# Patient Record
Sex: Female | Born: 1935 | Race: Black or African American | Hispanic: No | Marital: Married | Smoking: Never smoker
Health system: Southern US, Community
[De-identification: ages and names within clinical notes are randomized; demographics above are authoritative.]

## PROBLEM LIST (undated history)

## (undated) DIAGNOSIS — I340 Nonrheumatic mitral (valve) insufficiency: Secondary | ICD-10-CM

## (undated) DIAGNOSIS — I10 Essential (primary) hypertension: Secondary | ICD-10-CM

## (undated) DIAGNOSIS — E785 Hyperlipidemia, unspecified: Secondary | ICD-10-CM

## (undated) DIAGNOSIS — F419 Anxiety disorder, unspecified: Secondary | ICD-10-CM

## (undated) DIAGNOSIS — R002 Palpitations: Secondary | ICD-10-CM

## (undated) DIAGNOSIS — J449 Chronic obstructive pulmonary disease, unspecified: Secondary | ICD-10-CM

## (undated) DIAGNOSIS — F329 Major depressive disorder, single episode, unspecified: Secondary | ICD-10-CM

## (undated) DIAGNOSIS — F32A Depression, unspecified: Secondary | ICD-10-CM

## (undated) DIAGNOSIS — E039 Hypothyroidism, unspecified: Secondary | ICD-10-CM

## (undated) DIAGNOSIS — I4891 Unspecified atrial fibrillation: Secondary | ICD-10-CM

## (undated) DIAGNOSIS — E119 Type 2 diabetes mellitus without complications: Secondary | ICD-10-CM

## (undated) DIAGNOSIS — I509 Heart failure, unspecified: Secondary | ICD-10-CM

## (undated) HISTORY — DX: Essential (primary) hypertension: I10

## (undated) HISTORY — DX: Type 2 diabetes mellitus without complications: E11.9

## (undated) HISTORY — DX: Hyperlipidemia, unspecified: E78.5

## (undated) HISTORY — DX: Anxiety disorder, unspecified: F41.9

## (undated) HISTORY — PX: OTHER SURGICAL HISTORY: SHX169

## (undated) HISTORY — DX: Nonrheumatic mitral (valve) insufficiency: I34.0

## (undated) HISTORY — DX: Hypothyroidism, unspecified: E03.9

## (undated) HISTORY — DX: Palpitations: R00.2

## (undated) HISTORY — DX: Major depressive disorder, single episode, unspecified: F32.9

## (undated) HISTORY — DX: Heart failure, unspecified: I50.9

## (undated) HISTORY — DX: Depression, unspecified: F32.A

---

## 2002-06-15 ENCOUNTER — Encounter: Payer: Self-pay | Admitting: Internal Medicine

## 2002-06-15 ENCOUNTER — Ambulatory Visit (HOSPITAL_COMMUNITY): Admission: RE | Admit: 2002-06-15 | Discharge: 2002-06-15 | Payer: Self-pay | Admitting: Internal Medicine

## 2008-08-26 ENCOUNTER — Ambulatory Visit: Payer: Self-pay | Admitting: Cardiology

## 2008-08-27 ENCOUNTER — Encounter: Payer: Self-pay | Admitting: Cardiology

## 2008-09-20 ENCOUNTER — Encounter: Payer: Self-pay | Admitting: Physician Assistant

## 2008-09-20 ENCOUNTER — Ambulatory Visit: Payer: Self-pay | Admitting: Cardiology

## 2008-09-24 ENCOUNTER — Ambulatory Visit: Payer: Self-pay | Admitting: Cardiology

## 2008-09-25 ENCOUNTER — Encounter: Payer: Self-pay | Admitting: Cardiology

## 2008-10-30 ENCOUNTER — Encounter: Payer: Self-pay | Admitting: Physician Assistant

## 2008-10-30 ENCOUNTER — Ambulatory Visit: Payer: Self-pay | Admitting: Cardiology

## 2008-11-01 ENCOUNTER — Encounter: Payer: Self-pay | Admitting: Physician Assistant

## 2008-11-05 ENCOUNTER — Encounter: Payer: Self-pay | Admitting: Cardiology

## 2008-11-07 ENCOUNTER — Encounter: Payer: Self-pay | Admitting: Physician Assistant

## 2008-11-07 ENCOUNTER — Ambulatory Visit: Payer: Self-pay | Admitting: Cardiology

## 2009-02-12 DIAGNOSIS — E785 Hyperlipidemia, unspecified: Secondary | ICD-10-CM

## 2009-02-12 DIAGNOSIS — R002 Palpitations: Secondary | ICD-10-CM | POA: Insufficient documentation

## 2009-02-12 DIAGNOSIS — R0602 Shortness of breath: Secondary | ICD-10-CM

## 2009-03-21 ENCOUNTER — Ambulatory Visit: Payer: Self-pay | Admitting: Cardiology

## 2009-03-24 ENCOUNTER — Encounter: Payer: Self-pay | Admitting: Physician Assistant

## 2009-09-01 ENCOUNTER — Encounter: Payer: Self-pay | Admitting: Cardiology

## 2009-10-09 ENCOUNTER — Ambulatory Visit: Payer: Self-pay | Admitting: Cardiology

## 2009-10-09 DIAGNOSIS — I340 Nonrheumatic mitral (valve) insufficiency: Secondary | ICD-10-CM

## 2009-10-09 DIAGNOSIS — I10 Essential (primary) hypertension: Secondary | ICD-10-CM

## 2010-03-16 ENCOUNTER — Ambulatory Visit: Payer: Self-pay | Admitting: Cardiology

## 2010-03-17 ENCOUNTER — Encounter: Payer: Self-pay | Admitting: Cardiology

## 2010-04-28 ENCOUNTER — Telehealth: Payer: Self-pay | Admitting: Cardiology

## 2010-05-07 ENCOUNTER — Inpatient Hospital Stay (HOSPITAL_COMMUNITY): Admission: EM | Admit: 2010-05-07 | Discharge: 2010-05-09 | Payer: Self-pay | Source: Home / Self Care

## 2010-05-14 ENCOUNTER — Ambulatory Visit: Payer: Self-pay | Admitting: Cardiology

## 2010-05-22 ENCOUNTER — Encounter: Payer: Self-pay | Admitting: Adult Health

## 2010-05-22 DIAGNOSIS — E119 Type 2 diabetes mellitus without complications: Secondary | ICD-10-CM

## 2010-05-22 DIAGNOSIS — N259 Disorder resulting from impaired renal tubular function, unspecified: Secondary | ICD-10-CM | POA: Insufficient documentation

## 2010-05-22 DIAGNOSIS — R600 Localized edema: Secondary | ICD-10-CM

## 2010-05-26 ENCOUNTER — Ambulatory Visit (HOSPITAL_COMMUNITY)
Admission: RE | Admit: 2010-05-26 | Discharge: 2010-05-26 | Payer: Self-pay | Source: Home / Self Care | Attending: Cardiology | Admitting: Cardiology

## 2010-05-26 ENCOUNTER — Encounter (INDEPENDENT_AMBULATORY_CARE_PROVIDER_SITE_OTHER): Payer: Self-pay | Admitting: *Deleted

## 2010-05-26 ENCOUNTER — Encounter: Payer: Self-pay | Admitting: Adult Health

## 2010-05-26 LAB — CONVERTED CEMR LAB
Calcium: 9.8 mg/dL (ref 8.4–10.5)
Creatinine, Ser: 0.86 mg/dL (ref 0.40–1.20)
Glucose, Bld: 71 mg/dL (ref 70–99)
Sodium: 139 meq/L (ref 135–145)

## 2010-06-05 ENCOUNTER — Encounter (INDEPENDENT_AMBULATORY_CARE_PROVIDER_SITE_OTHER): Payer: Self-pay | Admitting: *Deleted

## 2010-06-05 ENCOUNTER — Telehealth (INDEPENDENT_AMBULATORY_CARE_PROVIDER_SITE_OTHER): Payer: Self-pay | Admitting: *Deleted

## 2010-06-05 ENCOUNTER — Telehealth (INDEPENDENT_AMBULATORY_CARE_PROVIDER_SITE_OTHER): Payer: Self-pay

## 2010-06-19 ENCOUNTER — Ambulatory Visit
Admission: RE | Admit: 2010-06-19 | Discharge: 2010-06-19 | Payer: Self-pay | Source: Home / Self Care | Attending: Cardiology | Admitting: Cardiology

## 2010-06-23 NOTE — Progress Notes (Signed)
Summary: patient weak and not feeling  Phone Note Call from Patient Call back at Home Phone [phone]   Caller: Patient Reason for Call: Talk to Nurse Summary of Call: patient not felling well. Feeling weak  170/69 12/3   152/66 12/5   pulse 93 right arm    166/70 pulse 82 left arm  125-63 pulse 82 right arm   166/70 pulse 82  left arm  Initial call taken by: Claudette Laws,  April 28, 2010 9:11 AM  Follow-up for Phone Call        Pt concerned about what she should do about BP and feeling weak. She is requesting an appt for f/u on weak feeling. Pt scheduled on 1/4 with Dr. Diona Browner per her request. She did not want to wait for response from Dr. Diona Browner but rather requested appt be scheduled. She also refused sooner appt w/PA.  Pt also states she has 2 days of simvastatin left and will start Lipitor on Friday. Reminder placed for labs in March. Pt is aware of this plan though she does seem somewhat confused regarding her medications. She states she was told to stop heart medicine and started Lipitor. Spent about 10 minutes explaining to pt that she was to start Lipitor only once she finished Simvastatin. She was not to stop any medication other than simvastatin. Pt verbalized understandinding. Follow-up by: Cyril Loosen, RN, BSN,  April 28, 2010 11:23 AM     Appended Document: patient weak and not feeling Pt returned called. She had pharmacy verfiy medications as she couldn't read the bottle on her metoprolol. She states she thought she was almost out of the simvastatin but she was actually almost out of metoprolol. She does states she has been taking these correctly, however. She states she has about 22 of the simvastatin left. She will contact us when she starts the Lipitor so that we can order labs for 3 months later.

## 2010-06-23 NOTE — Letter (Signed)
Summary: Appointment -missed  [facility] [address]   [place], [place] [address]   Phone: [phone]  Fax: [phone]     September 01, 2009 MRN: [id number]     [facility] [address] [place], [place]  [address]     Dear Ms. Pautsch,  Our records indicate you missed your appointment on September 01, 2009                        with Dr.  Diona Browner.   It is very important that we reach you to reschedule this appointment. We look forward to participating in your health care needs.   Please contact us at the number listed above at your earliest convenience to reschedule this appointment.   Sincerely,    Glass blower/designer

## 2010-06-23 NOTE — Assessment & Plan Note (Signed)
Summary: 6 MO FU R/D NO SHOW   Visit Type:  Follow-up Primary Provider:  Dr. Lia Hopping   History of Present Illness: 77 year old woman presents for followup. She is here with her husband. She reports less breathlessness in general, and also no progressive palpitations. She has had no dizziness or syncope.  Followup labs in November 2010  revealed AST 19, ALT 18, total cholesterol 138, triglycerides 47, HDL 44, LDL 85. She states that she has recently had followup labs with Dr. Olena Leatherwood who has been following her cholesterol.  She brought in home blood pressure checks which actually look quite good, generally with systolics in the 120 range.  She asked about doing some work in the garden with her husband in the mornings. We talked about the importance of regular exercise.  Current Medications (verified): 1)  Metoprolol Tartrate 50 Mg Tabs (Metoprolol Tartrate) .... Take One Tablet By Mouth Twice A Day 2)  Synthroid 50 Mcg Tabs (Levothyroxine Sodium) .... Take One Tablet By Mouth Every Morning 3)  Quinapril Hcl 20 Mg Tabs (Quinapril Hcl) .... Take 1 Tablet By Mouth Once A Day 4)  Simvastatin 40 Mg Tabs (Simvastatin) .... Take 1 Tab By Mouth At Bedtime 5)  Oscal 500/200 D-3 500-200 Mg-Unit Tabs (Calcium-Vitamin D) .... Take 1 Tablet By Mouth Once A Day 6)  Glyburide 5 Mg Tabs (Glyburide) .... Take 1 Tablet By Mouth Once A Day 7)  Aspirin 81 Mg Tbec (Aspirin) .... Take 1 Tablet By Mouth Once A Day 8)  Hydrochlorothiazide 50 Mg Tabs (Hydrochlorothiazide) .... Take 1 Tablet By Mouth Once A Day 9)  Amlodipine Besylate 10 Mg Tabs (Amlodipine Besylate) .... Take 1 Tablet By Mouth Once A Day 10)  Fish Oil 1200 Mg Caps (Omega-3 Fatty Acids) .... Take 1 Tablet By Mouth Once A Day 11)  Centrum Silver Ultra Womens  Tabs (Multiple Vitamins-Minerals) .... Take 1 Tablet By Mouth Once A Day  Allergies (verified): No Known Drug Allergies  Comments:  Nurse/Medical Assistant: The patient's  medications and allergies were reviewed with the patient and were updated in the Medication and Allergy Lists. Bottles reviewed.   Past History:  Past Medical History: Hypothyroidism Mitral regurgitation - mild Palpitations - PAT/EAT/NSVT, normal LVEF Hypertension Diabetes Type 2  Review of Systems       The patient complains of dyspnea on exertion.  The patient denies anorexia, fever, chest pain, syncope, headaches, hemoptysis, melena, and hematochezia.         Otherwise reviewed and negative.  Vital Signs:  Patient profile:   77 year old female Height:      66 inches Weight:      210 pounds Pulse rate:   77 / minute BP sitting:   157 / 73  (left arm) Cuff size:   large  Vitals Entered By: Carlye Grippe (Oct 09, 2009 9:49 AM)  Physical Exam  Additional Exam:  Obese woman in acute distress. HEENT: Conjunctiva and lids normal, oropharynx clear. Neck: Supple, no elevated JVP. Lungs: Clear auscultation, nonlabored. Cardiac: Regular rate and rhythm, no S3. Abdomen: Soft, nontender. Extremities: Chronic stasis with edema.   Nuclear Study  Procedure date:  11/07/2008  Findings:      Lexiscan Cardiolite demonstrated no evidence of focal ischemia with LVEF 64%.  EKG  Procedure date:  10/09/2009  Findings:      Normal sinus rhythm at 78 beats per minute with PAC.  Impression & Recommendations:  Problem # 1:  ESSENTIAL HYPERTENSION, BENIGN (ICD-401.1)  Blood  pressure up today, although her trend at home looks quite good. No specific changes were made. Keep an eye on this with Dr. Olena Leatherwood.  Her updated medication list for this problem includes:    Metoprolol Tartrate 50 Mg Tabs (Metoprolol tartrate) .Marland Kitchen... Take one tablet by mouth twice a day    Quinapril Hcl 20 Mg Tabs (Quinapril hcl) .Marland Kitchen... Take 1 tablet by mouth once a day    Aspirin 81 Mg Tbec (Aspirin) .Marland Kitchen... Take 1 tablet by mouth once a day    Hydrochlorothiazide 50 Mg Tabs (Hydrochlorothiazide) .Marland Kitchen... Take 1  tablet by mouth once a day    Amlodipine Besylate 10 Mg Tabs (Amlodipine besylate) .Marland Kitchen... Take 1 tablet by mouth once a day  Problem # 2:  HYPERLIPIDEMIA-MIXED (ICD-272.4)  Followed by Dr. Olena Leatherwood, reportedly with good control based on recent labs.  Her updated medication list for this problem includes:    Simvastatin 40 Mg Tabs (Simvastatin) .Marland Kitchen... Take 1 tab by mouth at bedtime  Problem # 3:  PALPITATIONS (ICD-785.1)  Less problematic. No definite atrial fibrillation has been observed. She has had PAT and EAT predominantly, relatively brief.  Her updated medication list for this problem includes:    Metoprolol Tartrate 50 Mg Tabs (Metoprolol tartrate) .Marland Kitchen... Take one tablet by mouth twice a day    Quinapril Hcl 20 Mg Tabs (Quinapril hcl) .Marland Kitchen... Take 1 tablet by mouth once a day    Aspirin 81 Mg Tbec (Aspirin) .Marland Kitchen... Take 1 tablet by mouth once a day    Amlodipine Besylate 10 Mg Tabs (Amlodipine besylate) .Marland Kitchen... Take 1 tablet by mouth once a day  Orders: EKG w/ Interpretation (93000)  Problem # 4:  MITRAL VALVE DISORDERS (ICD-424.0)  Mild mitral regurgitation by echocardiography. Can follow clinically at this point.  Her updated medication list for this problem includes:    Metoprolol Tartrate 50 Mg Tabs (Metoprolol tartrate) .Marland Kitchen... Take one tablet by mouth twice a day    Quinapril Hcl 20 Mg Tabs (Quinapril hcl) .Marland Kitchen... Take 1 tablet by mouth once a day    Hydrochlorothiazide 50 Mg Tabs (Hydrochlorothiazide) .Marland Kitchen... Take 1 tablet by mouth once a day  Patient Instructions: 1)  Your physician wants you to follow-up in: 6 months. You will receive a reminder letter in the mail one-two months in advance. If you don't receive a letter, please call our office to schedule the follow-up appointment. 2)  Your physician recommends that you continue on your current medications as directed. Please refer to the Current Medication list given to you today.

## 2010-06-23 NOTE — Assessment & Plan Note (Signed)
Summary: 6 MO FUL FHOLT   Visit Type:  Follow-up Primary Mckynna Vanloan:  Dr. Lia Hopping   History of Present Illness: 77 year old woman presents for followup. She was seen back in May. She reports doing very well. She has had no palpitations or chest pain. She keeps a record of her blood pressure and glucose checks at home. We reviewed these today. Generally her blood pressure is reasonably well controlled.  I went over her medications, and we discussed modification in lipid treatment since she is on both simvastatin and amlodipine. She is also due for followup fasting lipid profile and liver function tests.  Clinical Review Panels:  Echocardiogram Echocardiogram 1. The left ventricular chamber size is normal. There is no left         ventricular hypertrophy.   Global left ventricular wall motion and         contractility are within normal limits. The estimated ejection         fraction is 55-60%.  Abnormal left ventricular diastolic filling is         observed, consistent with impaired relaxation.          2. The left atrium is mildly dilated.         3. There is mitral annular calcification. There is mild mitral         regurgitation.          4. There is aortic annular calcification. There is no evidence of           aortic regurgitation. The mean gradient of the aortic valve is 6 mmHg.                   5. There is mild tricuspid regurgitation. The right ventricular         systolic pressure is calculated at 35 mmHg.          6. There is no pericardial effusion. (08/27/2008)    Preventive Screening-Counseling & Management  Alcohol-Tobacco     Smoking Status: quit     Year Quit: 1950  Current Medications (verified): 1)  Metoprolol Tartrate 50 Mg Tabs (Metoprolol Tartrate) .... Take One Tablet By Mouth Twice A Day 2)  Levothyroxine Sodium 100 Mcg Tabs (Levothyroxine Sodium) .... Take 1 Tablet By Mouth Once A Day 3)  Quinapril Hcl 20 Mg Tabs (Quinapril Hcl) .... Take 1 Tablet  By Mouth Once A Day 4)  Simvastatin 40 Mg Tabs (Simvastatin) .... Take 1 Tab By Mouth At Bedtime 5)  Oscal 500/200 D-3 500-200 Mg-Unit Tabs (Calcium-Vitamin D) .... Take 1 Tablet By Mouth Once A Day 6)  Glyburide 5 Mg Tabs (Glyburide) .... Take 1 Tablet By Mouth Once A Day 7)  Aspirin 81 Mg Tbec (Aspirin) .... Take 1 Tablet By Mouth Once A Day 8)  Hydrochlorothiazide 50 Mg Tabs (Hydrochlorothiazide) .... Take 1 Tablet By Mouth Once A Day 9)  Amlodipine Besylate 10 Mg Tabs (Amlodipine Besylate) .... Take 1 Tablet By Mouth Once A Day 10)  Fish Oil 1000 Mg Caps (Omega-3 Fatty Acids) .... Take 1 Tablet By Mouth Once A Day 11)  Centrum Silver Ultra Womens  Tabs (Multiple Vitamins-Minerals) .... Take 1 Tablet By Mouth Once A Day  Allergies (verified): No Known Drug Allergies  Comments:  Nurse/Medical Assistant: The patient's medication bottles and allergies were reviewed with the patient and were updated in the Medication and Allergy Lists.  Past History:  Past Medical History: Last updated: 10/09/2009 Hypothyroidism Mitral regurgitation - mild  Palpitations - PAT/EAT/NSVT, normal LVEF Hypertension Diabetes Type 2  Social History: Last updated: 02/12/2009 Married  Tobacco Use - No.  Alcohol Use - no  Review of Systems  The patient denies anorexia, fever, chest pain, syncope, dyspnea on exertion, peripheral edema, melena, and hematochezia.         Otherwise reviewed and negative.  Vital Signs:  Patient profile:   77 year old female Height:      66 inches Weight:      198 pounds BMI:     32.07 Pulse rate:   67 / minute BP sitting:   131 / 77  (left arm) Cuff size:   large  Vitals Entered By: Carlye Grippe (March 16, 2010 11:03 AM)  Nutrition Counseling: Patient's BMI is greater than 25 and therefore counseled on weight management options.  Physical Exam  Additional Exam:  Obese woman in acute distress. HEENT: Conjunctiva and lids normal, oropharynx clear. Neck:  Supple, no elevated JVP. Lungs: Clear auscultation, nonlabored. Cardiac: Regular rate and rhythm, no S3. Abdomen: Soft, nontender. Extremities: Chronic stasis with edema. Skin: Warm and dry. Musculoskeletal: No gross deformities. Neuropsychiatric: Alert and oriented x3, affect appropriate.   Impression & Recommendations:  Problem # 1:  PALPITATIONS (ICD-785.1)  Quiescent, with prior documentation of PAT/EAT. Continue beta blocker therapy.  Her updated medication list for this problem includes:    Metoprolol Tartrate 50 Mg Tabs (Metoprolol tartrate) .Marland Kitchen... Take one tablet by mouth twice a day    Quinapril Hcl 20 Mg Tabs (Quinapril hcl) .Marland Kitchen... Take 1 tablet by mouth once a day    Aspirin 81 Mg Tbec (Aspirin) .Marland Kitchen... Take 1 tablet by mouth once a day    Amlodipine Besylate 10 Mg Tabs (Amlodipine besylate) .Marland Kitchen... Take 1 tablet by mouth once a day  Problem # 2:  MITRAL VALVE DISORDERS (ICD-424.0)  Mild mitral regurgitation, following clinically at this point.  Her updated medication list for this problem includes:    Metoprolol Tartrate 50 Mg Tabs (Metoprolol tartrate) .Marland Kitchen... Take one tablet by mouth twice a day    Quinapril Hcl 20 Mg Tabs (Quinapril hcl) .Marland Kitchen... Take 1 tablet by mouth once a day    Hydrochlorothiazide 50 Mg Tabs (Hydrochlorothiazide) .Marland Kitchen... Take 1 tablet by mouth once a day  Problem # 3:  ESSENTIAL HYPERTENSION, BENIGN (ICD-401.1)  Blood pressure management is reasonable overall.  Her updated medication list for this problem includes:    Metoprolol Tartrate 50 Mg Tabs (Metoprolol tartrate) .Marland Kitchen... Take one tablet by mouth twice a day    Quinapril Hcl 20 Mg Tabs (Quinapril hcl) .Marland Kitchen... Take 1 tablet by mouth once a day    Aspirin 81 Mg Tbec (Aspirin) .Marland Kitchen... Take 1 tablet by mouth once a day    Hydrochlorothiazide 50 Mg Tabs (Hydrochlorothiazide) .Marland Kitchen... Take 1 tablet by mouth once a day    Amlodipine Besylate 10 Mg Tabs (Amlodipine besylate) .Marland Kitchen... Take 1 tablet by mouth once a  day  Problem # 4:  HYPERLIPIDEMIA-MIXED (ICD-272.4)  She is due for followup fasting lipid profile and liver function tests. These will be arranged. I plan to cut her simvastatin back to 20 mg daily, and then switch to Lipitor 40 mg daily once she has completed her current supply. She is concurrently on Norvasc.  Her updated medication list for this problem includes:    Simvastatin 40 Mg Tabs (Simvastatin) .Marland Kitchen... Take 1/2 tablet by mouth at bedtime until gone, then stop and start lipitor    Lipitor 40 Mg Tabs (  Atorvastatin calcium) .Marland Kitchen... Take one tablet by mouth at bedtime.  Orders: T-Lipid Profile [id number]) T-Hepatic Function [phone])  Patient Instructions: 1)  Your physician wants you to follow-up in: 6 months. You will receive a reminder letter in the mail one-two months in advance. If you don't receive a letter, please call our office to schedule the follow-up appointment. 2)  Your physician recommends that you go to the [facility] for a FASTING lipid profile and liver function labs:  Do not eat or drink after midnight.  3)  Decrease simvastatin to 1/2 tablet by mouth at bedtime. When you finish current supply, stop simvastatin and start Lipitor 40mg  by mouth at bedtime.  Prescriptions: LIPITOR 40 MG TABS (ATORVASTATIN CALCIUM) Take one tablet by mouth at bedtime.  #30 x 6   Entered by:   Cyril Loosen, RN, BSN   Authorized by:   Loreli Slot, MD, [facility]   Signed by:   Cyril Loosen, RN, BSN on 03/16/2010   Method used:   Electronically to        [facility]* (retail)       [address]       [place], [place]  [address]       Ph: [phone]       Fax: [phone]   RxID:   [id number]

## 2010-06-25 NOTE — Letter (Signed)
Summary: [place] [facility], [facility] at [facility]  [address] [place] [address]   Phone: [phone]  Fax: [phone]      May 26, 2010 MRN: [id number]   [facility] [address] [place], [place]  [address]   Dear Ms. Mirsky,  Your test ordered by Selena Batten has been reviewed by your physician (or physician assistant) and was found to be normal or stable. Your physician (or physician assistant) felt no changes were needed at this time.  ____ Echocardiogram  ____ Cardiac Stress Test  __x__ Lab Work  _x___ Peripheral vascular study of arms, legs or neck  ____ CT scan or X-ray  ____ Lung or Breathing test  ____ Other:  No change in medical treatment at this time, per Ancil Linsey.  Thank you, Naydeen Speirs Allyne Gee RN    [place] Bing, MD, Lenise Arena.C.Gaylord Shih, MD, F.A.C.C Lewayne Bunting, MD, F.A.C.C Nona Dell, MD, F.A.C.C Charlton Haws, MD, Lenise Arena.C.C

## 2010-06-25 NOTE — Assessment & Plan Note (Signed)
Summary: 1 MTH F/U PER CHECKOUT ON 05/22/10/TG   Visit Type:  Follow-up Primary Provider:  Dr. Lia Hopping   History of Present Illness: [name] is a very pleasant 77 y/o AAF we are following for continued assessment and treatment of hypertension, DOE and hyperlipidemia.  She is without complaint today with the exception of wt gain.  She and her husband recently celebrated their 63rd wedding aniversary with a celebration at her church.  They has a nice dinner afterwards and she has noticed some fluid wt gain.  She weighs herself daily and keeps a record.  The wt has gone up 6 lbs in 3 days. She denies any DOE, chest pain, but complains of mild LEE.    Current Medications (verified): 1)  Levothyroxine Sodium 100 Mcg Tabs (Levothyroxine Sodium) .... Take 1 Tablet By Mouth Once A Day 2)  Quinapril Hcl 20 Mg Tabs (Quinapril Hcl) .... Take 1 Tablet By Mouth Once A Day 3)  Oscal 500/200 D-3 500-200 Mg-Unit Tabs (Calcium-Vitamin D) .... Take 1 Tablet By Mouth Once A Day 4)  Glyburide 5 Mg Tabs (Glyburide) .... Take 1 Tablet By Mouth Once A Day 5)  Aspirin 81 Mg Tbec (Aspirin) .... Take 1 Tablet By Mouth Once A Day 6)  Amlodipine Besylate 10 Mg Tabs (Amlodipine Besylate) .... Take 1 Tablet By Mouth Once A Day 7)  Fish Oil 1000 Mg Caps (Omega-3 Fatty Acids) .... Take 1 Tablet By Mouth Once A Day 8)  Centrum Silver Ultra Womens  Tabs (Multiple Vitamins-Minerals) .... Take 1 Tablet By Mouth Once A Day 9)  Furosemide 20 Mg Tabs (Furosemide) .... Take 1 Tablet By Mouth As Needed 10)  Alprazolam 0.25 Mg Tabs (Alprazolam) .... Take As Needed 11)  Cetirizine Hcl 10 Mg Tabs (Cetirizine Hcl) .... Take 1/2 Tab As Needed 12)  Potassium Chloride 20 Meq/160ml Soln (Potassium Chloride) .... Take 1 Tab Daily 13)  Xopenex Hfa 45 Mcg/act Aero (Levalbuterol Tartrate) .... Use 2 Puffs As Needed 14)  Metoprolol Tartrate 50 Mg Tabs (Metoprolol Tartrate) .... Take 1 Tab Two Times A Day 15)  Lipitor 40 Mg Tabs  (Atorvastatin Calcium) .... Take One Tablet By Mouth Daily. 16)  Pepcid 20 Mg Tabs (Famotidine) .... Take 1 Tab Daily 17)  Coricidin Hbp Cold/flu 2-325 Mg Tabs (Chlorpheniramine-Acetaminophen) .... Use As Needed  Allergies (verified): No Known Drug Allergies  Comments:  Nurse/Medical Assistant: patient brought meds and she uses [facility] for meds   Review of Systems       All other systems have been reviewed and are negative unless stated above.   Vital Signs:  Patient profile:   78 year old female Weight:      193 pounds BMI:     31.26 O2 Sat:      97 % on Room air Pulse rate:   79 / minute BP sitting:   173 / 74  (left arm)  Vitals Entered By: Dreama Saa, CNA (June 19, 2010 10:32 AM)  O2 Flow:  Room air  Physical Exam  General:  Well developed, well nourished, in no acute distress.obese.   Lungs:  Clear bilaterally to auscultation and percussion. Heart:  Non-displaced PMI, chest non-tender; regular rate and rhythm, S1, S2 without murmurs, rubs or gallops. Carotid upstroke normal, no bruit. Normal abdominal aortic size, no bruits. Femorals normal pulses, no bruits. Pedals normal pulses. No edema, no varicosities. Abdomen:  Bowel sounds positive; abdomen soft and non-tender without masses, organomegaly, or hernias noted. No hepatosplenomegaly.  Msk:  Back normal, normal gait. Muscle strength and tone normal. Extremities:  1+ left pedal edema and 1+ right pedal edema.   Neurologic:  Alert and oriented x 3. Psych:  Normal affect.   Impression & Recommendations:  Problem # 1:  ESSENTIAL HYPERTENSION, BENIGN (ICD-401.1) Not well controlled on this visit. She has white coat syndrome.  Will continue to monitor this.  May need to increase antihypertensives anyway if this is consistant.  I have increased the lasix from as needed to 20mg  two times a day today and tomorrow with potassium doses.  She is to weigh everyday. If wt is not coming off or she becomes symptomatic, she  is to call.  She may need to be placed on this daily if wt gain is persisttant. Her updated medication list for this problem includes:    Quinapril Hcl 20 Mg Tabs (Quinapril hcl) .Marland Kitchen... Take 1 tablet by mouth once a day    Aspirin 81 Mg Tbec (Aspirin) .Marland Kitchen... Take 1 tablet by mouth once a day    Amlodipine Besylate 10 Mg Tabs (Amlodipine besylate) .Marland Kitchen... Take 1 tablet by mouth once a day    Furosemide 20 Mg Tabs (Furosemide) .Marland Kitchen... Take 1 tablet by mouth as needed    Metoprolol Tartrate 50 Mg Tabs (Metoprolol tartrate) .Marland Kitchen... Take 1 tab two times a day  Problem # 2:  DYSPNEA (ICD-786.05) Assessment: Unchanged  Her updated medication list for this problem includes:    Quinapril Hcl 20 Mg Tabs (Quinapril hcl) .Marland Kitchen... Take 1 tablet by mouth once a day    Aspirin 81 Mg Tbec (Aspirin) .Marland Kitchen... Take 1 tablet by mouth once a day    Amlodipine Besylate 10 Mg Tabs (Amlodipine besylate) .Marland Kitchen... Take 1 tablet by mouth once a day    Furosemide 20 Mg Tabs (Furosemide) .Marland Kitchen... Take 1 tablet by mouth as needed    Metoprolol Tartrate 50 Mg Tabs (Metoprolol tartrate) .Marland Kitchen... Take 1 tab two times a day  Patient Instructions: 1)  Your physician recommends that you schedule a follow-up appointment in: 3 months 2)  Your physician has recommended you make the following change in your medication: TAKE FUROSEMIDE 20MG  TODAY WHEN U GET HOME THEN AGAIN AT 4PM--- TAKE EXTRA DOSE OF POTASSIUM WITH EACH DOSE OF FUROSEMIDE 3)  TOMORROW TAKE FUROSEMIDE 20MG  IN AM WITH EXTRA DOSE OF POTASSIUM  IF WT IS NOT DECREASING TAKE 4PM DOSE OF FUROSEMIDE AND POTASSIUM 4)  TODAYS WEIGHT IS 193 IF WEIGHT SUNDAY IS NOT CLOSER TO 188LBS  PLEASE CALL [phone] ON CALL [place] DOCTOR

## 2010-06-25 NOTE — Progress Notes (Signed)
Summary: Pt starting Lipitor as planned at Oct office visit  Phone Note Call from Patient   Summary of Call: Pt contacted office regarding Lipitor. She was told at October office visit to complete current supply of Simvastatin and then start Lipitor 40mg . Pt was to contact the office when she started Lipitor so that FLP/LFT could be done 12 weeks later. Pt states she has now completed simvastatin and will begin Lipitor this weekend.   Tammy, RN in [place] notified to have labs done in 12 weeks as pt is now being followed in the [place] office. Initial call taken by: Cyril Loosen, RN, BSN,  June 05, 2010 9:37 AM    New/Updated Medications: LIPITOR 40 MG TABS (ATORVASTATIN CALCIUM) Take one tablet by mouth daily.  Appended Document: Pt starting Lipitor as planned at Oct office visit Pt with advised to start lipitor on 06/08/2010, flp on 07/20/2010

## 2010-06-25 NOTE — Assessment & Plan Note (Signed)
Summary: rov/eph   Visit Type:  Follow-up Primary Provider:  Dr. Lia Hopping   History of Present Illness: [name] is a delightful 77 y/o AAF are following for ongoing assessment and treatment of Dyspnea, with multiple CVRF.  We saw her during recent hospitalization on consultation for increased dyspnea and frequent arrythmias. She was continued on beta blockers and on further discussion, it was found that the patients mattress, pillows and carpeting were greater than 20 yrs old.  She was advised to change her pillows for new, have her carpet cleaned and to replace a new mattress or matteress cover. She was sent home on inhalers as well.  Since discharge she says she is breathing much much better and is beginning to have more energy.  She some complaints of leg weakness when she walks on them for a while.  She wants to return to church and continue her activities as prior to hospitalization.   Current Medications (verified): 1)  Levothyroxine Sodium 100 Mcg Tabs (Levothyroxine Sodium) .... Take 1 Tablet By Mouth Once A Day 2)  Quinapril Hcl 20 Mg Tabs (Quinapril Hcl) .... Take 1 Tablet By Mouth Once A Day 3)  Oscal 500/200 D-3 500-200 Mg-Unit Tabs (Calcium-Vitamin D) .... Take 1 Tablet By Mouth Once A Day 4)  Glyburide 5 Mg Tabs (Glyburide) .... Take 1 Tablet By Mouth Once A Day 5)  Aspirin 81 Mg Tbec (Aspirin) .... Take 1 Tablet By Mouth Once A Day 6)  Amlodipine Besylate 10 Mg Tabs (Amlodipine Besylate) .... Take 1 Tablet By Mouth Once A Day 7)  Fish Oil 1000 Mg Caps (Omega-3 Fatty Acids) .... Take 1 Tablet By Mouth Once A Day 8)  Centrum Silver Ultra Womens  Tabs (Multiple Vitamins-Minerals) .... Take 1 Tablet By Mouth Once A Day 9)  Furosemide 20 Mg Tabs (Furosemide) .... Take 1 Tablet By Mouth As Needed 10)  Simvastatin 40 Mg Tabs (Simvastatin) .... Take 1 Tab Daily 11)  Alprazolam 0.25 Mg Tabs (Alprazolam) .... Take As Needed 12)  Cetirizine Hcl 10 Mg Tabs (Cetirizine Hcl) .... Take  1/2 Tab As Needed 13)  Potassium Chloride 20 Meq/172ml Soln (Potassium Chloride) .... Take 1 Tab Daily 14)  Xopenex Hfa 45 Mcg/act Aero (Levalbuterol Tartrate) .... Use 2 Puffs As Needed 15)  Gnp Acid Reducer Max St 20 Mg Tabs (Famotidine) .... Take 1 Tab Daily 16)  Metoprolol Tartrate 50 Mg Tabs (Metoprolol Tartrate) .... Take 1 Tab Two Times A Day  Allergies (verified): No Known Drug Allergies  Comments:  Nurse/Medical Assistant: patient brought meds and reviewed with patients daughter she is no longer on HCTZ patient hasn't started lipitor yet still on simvastatin  Review of Systems       Leg weakness  All other systems have been reviewed and are negative unless stated above.   Vital Signs:  Patient profile:   77 year old female Weight:      188 pounds BMI:     30.45 O2 Sat:      97 % Pulse rate:   92 / minute BP sitting:   174 / 78  (right arm)  Vitals Entered By: Dreama Saa, CNA (May 22, 2010 1:00 PM)  Physical Exam  General:  Well developed, well nourished, in no acute distress. Lungs:  Clear bilaterally to auscultation and percussion. Heart:  Non-displaced PMI, chest non-tender; regular rate and rhythm, S1, S2 without murmurs, rubs or gallops. Carotid upstroke normal, no bruit. Normal abdominal aortic size, no bruits. Femorals normal  but pulses are diminshed in LE distal.  Legs are warm and without edema Abdomen:  Bowel sounds positive; abdomen soft and non-tender without masses, organomegaly, or hernias noted. No hepatosplenomegaly. Msk:  Back normal, normal gait. Muscle strength and tone normal. Pulses:  diminsihed L posterior tibial and diminished R posterior tibial.   Extremities:  No clubbing or cyanosis. Neurologic:  Alert and oriented x 3. Psych:  Normal affect.   EKG  Procedure date:  05/22/2010  Findings:      Normal sinus rhythm with rate of:87 bpm  PAC's noted.  PVC's noted.    Impression & Recommendations:  Problem # 1:  DYSPNEA  (ICD-786.05) Much improved since hospitalization. She is more active and wants to return to church.  I have advised her that she may do so.  I want her to stop her lasix  as she has normal EF with CRI.  Will repeat BMET to assess renal fx. She is to continue taking potassium as she was taking prior to admission  Will see  her in one month. The following medications were removed from the medication list:    Metoprolol Tartrate 50 Mg Tabs (Metoprolol tartrate) .Marland Kitchen... Take one tablet by mouth twice a day    Hydrochlorothiazide 50 Mg Tabs (Hydrochlorothiazide) .Marland Kitchen... Take 1 tablet by mouth once a day Her updated medication list for this problem includes:    Quinapril Hcl 20 Mg Tabs (Quinapril hcl) .Marland Kitchen... Take 1 tablet by mouth once a day    Aspirin 81 Mg Tbec (Aspirin) .Marland Kitchen... Take 1 tablet by mouth once a day    Amlodipine Besylate 10 Mg Tabs (Amlodipine besylate) .Marland Kitchen... Take 1 tablet by mouth once a day    Furosemide 20 Mg Tabs (Furosemide) .Marland Kitchen... Take 1 tablet by mouth as needed    Metoprolol Tartrate 50 Mg Tabs (Metoprolol tartrate) .Marland Kitchen... Take 1 tab two times a day  Problem # 2:  LEG PAIN (ICD-729.5) Femoral pulses are palpated and no bruits are ascultated.  However, unable to palpate distal pulses adequatley. Therefore ABI bilateral will be completed to assess for PAD.  She is to keep legs elevated when she is not walking.  Do not think this is related to myalgias on simvistatin as she has been on this for several years without complaints. Orders: LE Arterial Doppler/ABI (LE arterial doppler)  Problem # 3:  ESSENTIAL HYPERTENSION, BENIGN (ICD-401.1) Assessment: Unchanged  The following medications were removed from the medication list:    Metoprolol Tartrate 50 Mg Tabs (Metoprolol tartrate) .Marland Kitchen... Take one tablet by mouth twice a day    Hydrochlorothiazide 50 Mg Tabs (Hydrochlorothiazide) .Marland Kitchen... Take 1 tablet by mouth once a day Her updated medication list for this problem includes:    Quinapril Hcl  20 Mg Tabs (Quinapril hcl) .Marland Kitchen... Take 1 tablet by mouth once a day    Aspirin 81 Mg Tbec (Aspirin) .Marland Kitchen... Take 1 tablet by mouth once a day    Amlodipine Besylate 10 Mg Tabs (Amlodipine besylate) .Marland Kitchen... Take 1 tablet by mouth once a day    Furosemide 20 Mg Tabs (Furosemide) .Marland Kitchen... Take 1 tablet by mouth as needed    Metoprolol Tartrate 50 Mg Tabs (Metoprolol tartrate) .Marland Kitchen... Take 1 tab two times a day  Other Orders: T-Basic Metabolic Panel [phone])  Patient Instructions: 1)  Your physician recommends that you schedule a follow-up appointment in: 1 month 2)  Your physician recommends that you return for lab work in: Today 3)  Your physician has recommended you  make the following change in your medication: You may take your Furosemide as needed 4)  Your physician has requested that you have an ankle brachial index (ABI). During this test an ultrasound and blood pressure cuff are used to evaluate the arteries that supply the arms and legs with blood. Allow thirty minutes for this exam. There are no restrictions or special instructions.

## 2010-06-25 NOTE — Progress Notes (Signed)
Summary: OTC MEDS QUESTIONS  Phone Note Call from Patient Call back at Home Phone [phone]   Caller: PT Reason for Call: Talk to Nurse Summary of Call: PT WANTS TO KNOW WHAT OVER THE COUNTER MEDS SHE CAN TAKE FOR A COLD. Initial call taken by: Faythe Ghee,  June 05, 2010 11:46 AM  Follow-up for Phone Call        Pt. advised to take either Cloricidin or Guaifenesin for cold symptoms. She states she understands instructions given and and that she wrote the medications down so she will remeber. Follow-up by: Larita Fife Via LPN,  June 05, 2010 1:03 PM

## 2010-06-25 NOTE — Miscellaneous (Signed)
Summary: Orders Update  Clinical Lists Changes  Orders: Added new Test order of T-Lipid Profile ([id number]) - Signed Added new Test order of T-Hepatic Function ([id number]) - Signed 

## 2010-06-25 NOTE — Letter (Signed)
Summary: [facility], [facility] at [employer]  [address], [place] [address]   Phone: [phone]  Fax: [phone]     June 05, 2010 MRN: [id number]   [facility] [address] [place], [place]  [address]      YOUR LAB WORK IS DUE   MONDAY    July 20, 2010  Please go to [facility], located across the street from [facility] on the second floor.  Hours are Monday - Friday 7am until 7:30pm         Saturday 8am until 12noon    _x_  DO NOT EAT OR DRINK AFTER MIDNIGHT EVENING PRIOR TO LABWORK

## 2010-07-20 LAB — CONVERTED CEMR LAB
ALT: 15 units/L (ref 0–35)
AST: 16 units/L (ref 0–37)
Alkaline Phosphatase: 84 units/L (ref 39–117)
Bilirubin, Direct: 0.3 mg/dL (ref 0.0–0.3)
Cholesterol: 128 mg/dL (ref 0–200)
HDL: 57 mg/dL (ref >39)
Total Bilirubin: 1 mg/dL (ref 0.3–1.2)
Total CHOL/HDL Ratio: 2.2

## 2010-07-29 ENCOUNTER — Encounter: Payer: Self-pay | Admitting: *Deleted

## 2010-08-03 LAB — MAGNESIUM
Magnesium: 2.2 mg/dL (ref 1.5–2.5)
Magnesium: 2.3 mg/dL (ref 1.5–2.5)

## 2010-08-03 LAB — DIFFERENTIAL
Basophils Absolute: 0 10*3/uL (ref 0.0–0.1)
Basophils Absolute: 0 10*3/uL (ref 0.0–0.1)
Basophils Absolute: 0.1 10*3/uL (ref 0.0–0.1)
Eosinophils Absolute: 0.1 10*3/uL (ref 0.0–0.7)
Eosinophils Relative: 1 % (ref 0–5)
Lymphocytes Relative: 28 % (ref 12–46)
Lymphocytes Relative: 31 % (ref 12–46)
Lymphocytes Relative: 35 % (ref 12–46)
Monocytes Absolute: 0.3 10*3/uL (ref 0.1–1.0)
Monocytes Absolute: 0.4 10*3/uL (ref 0.1–1.0)
Monocytes Relative: 6 % (ref 3–12)
Neutro Abs: 3 10*3/uL (ref 1.7–7.7)
Neutro Abs: 3.1 10*3/uL (ref 1.7–7.7)
Neutrophils Relative %: 63 % (ref 43–77)

## 2010-08-03 LAB — BASIC METABOLIC PANEL
CO2: 21 mEq/L (ref 19–32)
Calcium: 9.6 mg/dL (ref 8.4–10.5)
Chloride: 101 mEq/L (ref 96–112)
Creatinine, Ser: 0.84 mg/dL (ref 0.4–1.2)
GFR calc non Af Amer: 59 mL/min — ABNORMAL LOW (ref >60)
Glucose, Bld: 132 mg/dL — ABNORMAL HIGH (ref 70–99)
Potassium: 4.2 mEq/L (ref 3.5–5.1)
Sodium: 134 mEq/L — ABNORMAL LOW (ref 135–145)
Sodium: 137 mEq/L (ref 135–145)

## 2010-08-03 LAB — CBC
HCT: 31.5 % — ABNORMAL LOW (ref 36.0–46.0)
Hemoglobin: 10.5 g/dL — ABNORMAL LOW (ref 12.0–15.0)
Hemoglobin: 12 g/dL (ref 12.0–15.0)
MCH: 28 pg (ref 26.0–34.0)
MCH: 28.3 pg (ref 26.0–34.0)
MCHC: 34.6 g/dL (ref 30.0–36.0)
MCHC: 34.9 g/dL (ref 30.0–36.0)
MCV: 80.4 fL (ref 78.0–100.0)
MCV: 81.9 fL (ref 78.0–100.0)
Platelets: 331 10*3/uL (ref 150–400)
RBC: 4.2 MIL/uL (ref 3.87–5.11)
RDW: 14.9 % (ref 11.5–15.5)
WBC: 5.3 10*3/uL (ref 4.0–10.5)

## 2010-08-03 LAB — CARDIAC PANEL(CRET KIN+CKTOT+MB+TROPI)
CK, MB: 2.4 ng/mL (ref 0.3–4.0)
CK, MB: 2.6 ng/mL (ref 0.3–4.0)
Relative Index: 1.5 (ref 0.0–2.5)
Relative Index: 1.6 (ref 0.0–2.5)
Total CK: 159 U/L (ref 7–177)
Troponin I: 0.01 ng/mL (ref 0.00–0.06)

## 2010-08-03 LAB — HEPATIC FUNCTION PANEL
AST: 20 U/L (ref 0–37)
Albumin: 3.3 g/dL — ABNORMAL LOW (ref 3.5–5.2)
Total Protein: 7 g/dL (ref 6.0–8.3)

## 2010-08-03 LAB — PHOSPHORUS
Phosphorus: 3.7 mg/dL (ref 2.3–4.6)
Phosphorus: 3.7 mg/dL (ref 2.3–4.6)

## 2010-08-03 LAB — LIPID PANEL
Cholesterol: 129 mg/dL (ref 0–200)
HDL: 53 mg/dL (ref >39)
LDL Cholesterol: 64 mg/dL (ref 0–99)
Triglycerides: 60 mg/dL (ref <150)

## 2010-08-03 LAB — BRAIN NATRIURETIC PEPTIDE: Pro B Natriuretic peptide (BNP): 32.7 pg/mL (ref 0.0–100.0)

## 2010-08-03 LAB — COMPREHENSIVE METABOLIC PANEL
Albumin: 3.5 g/dL (ref 3.5–5.2)
BUN: 6 mg/dL (ref 6–23)
Calcium: 9 mg/dL (ref 8.4–10.5)
Chloride: 104 mEq/L (ref 96–112)
Creatinine, Ser: 0.81 mg/dL (ref 0.4–1.2)
GFR calc non Af Amer: >60 mL/min (ref >60)
Total Bilirubin: 0.8 mg/dL (ref 0.3–1.2)

## 2010-08-03 LAB — GLUCOSE, CAPILLARY

## 2010-08-03 LAB — POCT CARDIAC MARKERS

## 2010-08-03 LAB — HEMOGLOBIN A1C: Hgb A1c MFr Bld: 6.1 % — ABNORMAL HIGH (ref <5.7)

## 2010-08-03 LAB — TSH: TSH: 0.555 u[IU]/mL (ref 0.350–4.500)

## 2010-08-24 ENCOUNTER — Encounter: Payer: Self-pay | Admitting: Cardiology

## 2010-08-24 ENCOUNTER — Ambulatory Visit: Payer: Self-pay | Admitting: Cardiology

## 2010-08-25 ENCOUNTER — Ambulatory Visit (INDEPENDENT_AMBULATORY_CARE_PROVIDER_SITE_OTHER): Payer: MEDICARE | Admitting: Cardiology

## 2010-08-25 ENCOUNTER — Encounter: Payer: Self-pay | Admitting: Cardiology

## 2010-08-25 VITALS — BP 158/75 | HR 80 | Ht 66.0 in | Wt 191.0 lb

## 2010-08-25 DIAGNOSIS — E785 Hyperlipidemia, unspecified: Secondary | ICD-10-CM

## 2010-08-25 DIAGNOSIS — I10 Essential (primary) hypertension: Secondary | ICD-10-CM

## 2010-08-25 DIAGNOSIS — R600 Localized edema: Secondary | ICD-10-CM

## 2010-08-25 DIAGNOSIS — R609 Edema, unspecified: Secondary | ICD-10-CM

## 2010-08-25 DIAGNOSIS — R002 Palpitations: Secondary | ICD-10-CM

## 2010-08-25 NOTE — Assessment & Plan Note (Signed)
LDL at goal. Continue same regimen.

## 2010-08-25 NOTE — Assessment & Plan Note (Signed)
Quiescent, on beta blocker therapy.

## 2010-08-25 NOTE — Patient Instructions (Signed)
**Note De-Identified Ronette Hank Obfuscation** Your physician recommends that you schedule a follow-up appointment in: 6 months at the [facility] office Your physician recommends that you continue on your current medications as directed. Please refer to the Current Medication list given to you today.

## 2010-08-25 NOTE — Progress Notes (Signed)
Clinical Summary [name] is a 77 y.o.female presenting for routine followup, last seen in January 2012. She is here with her daughter. She reports doing well, improved lower extremity edema, no significant palpitations. She did not bring her home blood pressure record in today, but states that her systolics are generally in the 120s to 140s. She does have an element of whitecoat hypertension. She reports compliance with her medications.  Followup lab work from February showed cholesterol 128, triglycerides 54, HDL 57, LDL 60, AST 16, ALT 15. We reviewed this today.   No Known Allergies  Current outpatient prescriptions:ALPRAZolam (XANAX) 0.25 MG tablet, Take 0.25 mg by mouth at bedtime as needed.  , Disp: , Rfl: ;  amLODipine (NORVASC) 10 MG tablet, Take 10 mg by mouth daily.  , Disp: , Rfl: ;  aspirin 81 MG tablet, Take 81 mg by mouth daily.  , Disp: , Rfl: ;  atorvastatin (LIPITOR) 40 MG tablet, Take 40 mg by mouth daily.  , Disp: , Rfl: ;  calcium carbonate (OS-CAL) 600 MG TABS, Take 600 mg by mouth daily.  , Disp: , Rfl:  cetirizine (ZYRTEC) 10 MG tablet, Take 10 mg by mouth daily.  , Disp: , Rfl: ;  Chlorpheniramine-APAP (CORICIDIN) 2-325 MG TABS, Take by mouth as needed.  , Disp: , Rfl: ;  famotidine (PEPCID) 20 MG tablet, Take 20 mg by mouth daily.  , Disp: , Rfl: ;  fish oil-omega-3 fatty acids 1000 MG capsule, Take 2 g by mouth daily.  , Disp: , Rfl: ;  furosemide (LASIX) 20 MG tablet, Take 20 mg by mouth daily.  , Disp: , Rfl:  glyBURIDE (DIABETA) 5 MG tablet, Take 5 mg by mouth daily with breakfast.  , Disp: , Rfl: ;  levalbuterol (XOPENEX HFA) 45 MCG/ACT inhaler, Inhale 1-2 puffs into the lungs every 4 (four) hours as needed.  , Disp: , Rfl: ;  levothyroxine (SYNTHROID, LEVOTHROID) 100 MCG tablet, Take 100 mcg by mouth daily.  , Disp: , Rfl: ;  metoprolol tartrate (LOPRESSOR) 25 MG tablet, Take 25 mg by mouth 2 (two) times daily.  , Disp: , Rfl:  Multiple Vitamins-Minerals (MULTIVITAMIN  WITH MINERALS) tablet, Take 1 tablet by mouth daily.  , Disp: , Rfl: ;  potassium chloride SA (K-DUR,KLOR-CON) 20 MEQ tablet, Take 20 mEq by mouth daily.  , Disp: , Rfl: ;  quinapril (ACCUPRIL) 20 MG tablet, Take 20 mg by mouth daily.  , Disp: , Rfl:   Past Medical History  Diagnosis Date  . Hypothyroidism   . Hypertension   . Type 2 diabetes mellitus   . Palpitations     PAT/EAT/NSVT, normal LVEF  . Mitral regurgitation     Mild    Social History Ms. Kroening reports that she has never smoked. She has never used smokeless tobacco. Ms. Doubek reports that she does not drink alcohol.  Review of Systems No fevers, chills, or unusual weight change. No chest pain, progressive shortness of breath, cough, hemoptysis, or wheezing. No palpitations, dizziness, or syncope. No dysphasia or odynophagia. Stable appetite with no abdominal pain, melena, or hematochezia. No orthopnea, PND. Has intermittent lower extremity edema. No focal motor weakness, memory problems, or speech deficits. Otherwise systems reviewed and negative except as already outlined.   Physical Examination Filed Vitals:   08/25/10 1313  BP: 158/75  Pulse: 80  Obese woman in acute distress. HEENT: Conjunctiva and lids normal, oropharynx clear. Neck: Supple, no elevated JVP. Lungs: Clear auscultation, nonlabored. Cardiac:  Regular rate and rhythm, no S3. Abdomen: Soft, nontender. Extremities: Chronic stasis with edema. Skin: Warm and dry. Musculoskeletal: No gross deformities. Neuropsychiatric: Alert and oriented x3, affect appropriate.   Problem List and Plan

## 2010-08-25 NOTE — Assessment & Plan Note (Signed)
Improved, stable control overall. No change present regimen.

## 2010-08-25 NOTE — Assessment & Plan Note (Signed)
No change in present regimen. Discussed sodium restriction again. Continue to follow at home, bring in blood pressure measurements next time. She does have a component of white coat hypertension as well.

## 2010-10-06 NOTE — Assessment & Plan Note (Signed)
[facility]                          [name]   Charlene, [name]                      MRN:          [id number]  DATE:10/30/2008                            DOB:          [birth year]    PRIMARY CARDIOLOGIST:  Charlene Sidle, MD   REASON FOR VISIT:  Scheduled followup.   [name] reports feeling much better since her last visit here in late  April.  We adjusted her medications at that time with up titration of  metoprolol, addition of low-dose aspirin, and down titration of  levothyroxine.  Blood work at that time indicated normal potassium and  renal function.   Unfortunately, [name] did not present for a recommended  pharmacologic perfusion imaging study, to rule out occult ischemia.  She  states that she was never contacted for this procedure.  She did,  however, complete the evaluation with a CardioNet monitor.  She denies  any subsequent palpitations.  We fully evaluated the rhythm strip as of  April 29, noting no definite evidence of atrial fibrillation.  There was  suggestion of probable PAT/EAT, frequent PACs, and 1 brief run of WCT.   Clinically, [name] denies any frank exertional chest pain.  Her  husband, however, does seem to suggest that she does experience some  exertional fatigue.  She denies any frank exertional dyspnea, but does  [name] some shortness of breath typically upon awakening in the morning.   CURRENT MEDICATIONS:  1. Aspirin 81 daily.  2. Metoprolol tartrate 50 b.i.d.  3. Levothyroxine 50 mcg daily.  4. Amlodipine 10 daily.  5. Glyburide 2.5 daily.  6. Quinapril 10 daily.  7. Hydrochlorothiazide 50 daily.   PHYSICAL EXAMINATION:  VITAL SIGNS:  Blood pressure 159/77, pulse 78,  regular, weight 214.  GENERAL:  A 77 year old female, obese, sitting upright, in no distress.  HEENT:  Normocephalic, atraumatic.  NECK:  Palpable pulses without bruits; no JVD at 90 degrees.  LUNGS:  Clear  to auscultation in all fields.  HEART:  Regular rate and rhythm.  No significant murmurs, rubs, or  gallops.  ABDOMEN:  Protuberant, nontender.  EXTREMITIES:  No significant edema.  NEURO:  Alert and oriented.   IMPRESSION:  1. Exertional dyspnea.      a.     Normal LVF.      b.     Mild mitral regurgitation.  2. Palpitations, quiescent.      a.     Probable PAT/EAT; no definite evidence of atrial       fibrillation/flutter.      b.     Transient NSVT.  3. Hypertension, uncontrolled.  4. Type 2 diabetes mellitus.  5. Dyslipidemia.  6. Hypothyroidism.      a.     Recent down titration of thyroid hormone, secondary to low       TSH/elevated T4.   PLAN:  1. Reschedule pharmacologic perfusion imaging study, as previously      recommended, to rule out occult ischemia.  2. Increase ACE inhibitor to 20 mg daily, for more aggressive blood  pressure control.  3. Initiate lipid management with simvastatin 40 mg daily.  The      patient will need a followup fasting lipid profile in 12 weeks.  4. The patient is due for a followup TSH level later this week.  We      will defer to Dr. Olena Leatherwood for continued monitoring and management      of her hypothyroidism.  5. Schedule return clinic followup with myself and Dr. Diona Browner in 4      months, or sooner pending review of her stress test result.      Charlene Searing, PA-C  Electronically Signed      Charlene Sidle, MD  Electronically Signed   GS/MedQ  DD: 10/30/2008  DT: 10/31/2008  Job #: [id number]   cc:   [name]

## 2010-10-06 NOTE — Assessment & Plan Note (Signed)
[facility]                          [facility]   Charlene, [name]                      MRN:          [id number]  DATE:09/20/2008                            DOB:          1927/05/01    PRIMARY CARDIOLOGIST:  Jonelle Sidle, MD.   REASON FOR CONSULT:  Post-hospital followup.   [name] presents to our clinic for the first time, following a recent  referral to [place] here at [facility] for evaluation of newly-  documented tachycardia.  She presented with no prior cardiac history.   We found no definite evidence of atrial fibrillation during her brief  stay and, in fact, suggested evidence of possible multifocal atrial  tachycardia with frequent PACs.   Her initial workup consisted of a 2-D echocardiogram, which indicated  normal LVEF (EF 55-60%) with no focal wall motion abnormalities.  There  was evidence of diastolic dysfunction and mild mitral regurgitation.   We then pursued further evaluation with a CardioNet monitor, which the  patient is still wearing.  We just reviewed these strips today and these  suggest probable paroxysmal atrial tachycardia/ectopic atrial  tachycardia with no definite evidence of atrial fibrillation or flutter.  There was also 1 brief, asymptomatic run of wide complex tachycardia.  Frequent PACs are noted.   Clinically, Ms. [name] continues to report no palpable tachy  palpitations.  She is tolerating her medications, including Toprol 50  daily.  She denies any exertional chest discomfort, but does have  shortness of breath with exertion, as well as easy fatigability. She  denies any PND, orthopnea, or significant lower extremity edema.   CURRENT MEDICATIONS:  1. Hydrochlorothiazide 50 daily.  2. Quinapril 10 daily.  3. Glyburide 2.5 daily.  4. Metoprolol succinate 50 daily.  5. Amlodipine 10 daily.  6. Levothyroxine 0.1 mg daily.   PHYSICAL EXAMINATION:  VITAL SIGNS:  Blood  pressure 174/77; pulse 82,  regular; and weight 216.  GENERAL:  A 77 year old female sitting upright in no distress.  HEENT:  Normocephalic, atraumatic.  NECK:  Palpable bilateral pulses without bruits; no JVD.  LUNGS:  Mild basilar crackles, no wheezes.  HEART:  Regular rate and rhythm.  No significant murmurs.  No rubs.  ABDOMEN:  Protuberant, nontender.  EXTREMITIES:  No pedal edema.  NEURO:  Focal deficit.   IMPRESSION:  1. Supraventricular tachycardia.      a.     Probable paroxysmal atrial tachycardia/ectopic atrial       tachycardia.      b.     No definite evidence of atrial fibrillation/flutter.      c.     Transient nonsustained ventricular tachycardia.  2. Exertional dyspnea.      a.     Normal left ventricular function.  3. Hypertension, controlled.  4. Type 2 diabetes mellitus.  5. Hypothyroidism.  6. Obesity.  7. Dyslipidemia.      a.     Current profile:  Total cholesterol 200, triglyceride 76,       HDL 59, and LDL 126.   PLAN:  1. I will schedule a [place]  Cardiolite test for risk stratification,      and rule out of occult ischemia.  If this is abnormal, then we will      need to discuss further evaluation with a coronary angiogram.  2. Adjust current medication regimen with uptitration of metoprolol to      50 mg p.o. b.i.d., with the tartrate formulation.  This is for      regulation of breakthrough paroxysmal atrial tachycardia and      frequent PACs, as well as amelioration of her uncontrolled blood      pressure.  3. Start low-dose aspirin 81 mg daily, for primary prophylaxis.  4. Down titrate levothyroxine to 0.05 mg daily, in light of her recent      low TSH of 0.15 with elevated free T4 of 1.39.  She will need a      followup TSH in approximately 6 weeks.  5. Followup BMET, in light of recent mild hypokalemia.  Of note, the      patient is on a high dose of hydrochlorothiazide.  She may need      supplemental potassium.  On the other hand, we may  consider backing      this down to 25 mg daily and increasing her ACE inhibitor.  We will      review this further when she returns to our clinic in approximately      1 month for followup.      [name], PA-C  Electronically Signed      Jonelle Sidle, MD  Electronically Signed   GS/MedQ  DD: 09/20/2008  DT: 09/21/2008  Job #: [id number]   cc:   Lia Hopping

## 2010-11-16 ENCOUNTER — Ambulatory Visit (HOSPITAL_COMMUNITY)
Admission: RE | Admit: 2010-11-16 | Discharge: 2010-11-16 | Disposition: A | Discharge status: Home / Self Care | Payer: Medicare Other | Source: Ambulatory Visit | Attending: Family Medicine | Admitting: Family Medicine

## 2010-11-16 ENCOUNTER — Other Ambulatory Visit (HOSPITAL_COMMUNITY): Payer: Self-pay | Admitting: Family Medicine

## 2010-11-16 DIAGNOSIS — R0989 Other specified symptoms and signs involving the circulatory and respiratory systems: Secondary | ICD-10-CM | POA: Insufficient documentation

## 2010-11-16 DIAGNOSIS — R0600 Dyspnea, unspecified: Secondary | ICD-10-CM

## 2010-11-16 DIAGNOSIS — R0609 Other forms of dyspnea: Secondary | ICD-10-CM | POA: Insufficient documentation

## 2010-11-24 ENCOUNTER — Other Ambulatory Visit: Payer: Self-pay | Admitting: Cardiology

## 2010-11-24 NOTE — Telephone Encounter (Signed)
.   Requested Prescriptions   Pending Prescriptions Disp Refills  . quinapril (ACCUPRIL) 20 MG tablet      Sig: Take 1 tablet (20 mg total) by mouth daily.  \

## 2010-11-25 MED ORDER — QUINAPRIL HCL 20 MG PO TABS: 20.0000 mg | ORAL_TABLET | Freq: Every day | ORAL | Status: DC

## 2011-01-29 ENCOUNTER — Other Ambulatory Visit: Payer: Self-pay | Admitting: *Deleted

## 2011-01-29 MED ORDER — ATORVASTATIN CALCIUM 40 MG PO TABS: 40.0000 mg | ORAL_TABLET | Freq: Every day | ORAL | Status: DC

## 2011-03-02 ENCOUNTER — Ambulatory Visit (INDEPENDENT_AMBULATORY_CARE_PROVIDER_SITE_OTHER): Payer: Medicare Other | Admitting: Cardiology

## 2011-03-02 ENCOUNTER — Encounter: Payer: Self-pay | Admitting: Cardiology

## 2011-03-02 VITALS — BP 149/78 | HR 77 | Ht 66.0 in | Wt 191.0 lb

## 2011-03-02 DIAGNOSIS — E785 Hyperlipidemia, unspecified: Secondary | ICD-10-CM

## 2011-03-02 DIAGNOSIS — R002 Palpitations: Secondary | ICD-10-CM

## 2011-03-02 DIAGNOSIS — I10 Essential (primary) hypertension: Secondary | ICD-10-CM

## 2011-03-02 DIAGNOSIS — I447 Left bundle-branch block, unspecified: Secondary | ICD-10-CM

## 2011-03-02 NOTE — Assessment & Plan Note (Signed)
Followed by Dr. Sudie Bailey.

## 2011-03-02 NOTE — Assessment & Plan Note (Signed)
New compared to old tracings, however not clearly symptomatic. No chest pain or progressive shortness of breath. Likely reflects underlying conduction system disease, although she seems to be tolerating current dose of beta blocker well. Continue observation for now.

## 2011-03-02 NOTE — Progress Notes (Signed)
Clinical Summary [name] is a 77 y.o.female presenting for followup. She was seen in April. She states that she has been feeling well, planted and harvested a garden this summer. She denies any progressive palpitations, no dizziness or syncope. Furthermore has had no chest pain. Chronic lower community edema waxes and wanes, seems to be fairly stable.  She reports compliance with her cardiac medications. Continues on beta blocker therapy. She is followed by Dr. Sudie Bailey with lab work.   No Known Allergies  Medication list reviewed.  Past Medical History  Diagnosis Date  . Hypothyroidism   . Hypertension   . Type 2 diabetes mellitus   . Palpitations     PAT/EAT/NSVT, normal LVEF  . Mitral regurgitation     Mild    Social History [name] reports that she has never smoked. She has never used smokeless tobacco. [name] reports that she does not drink alcohol.  Review of Systems Otherwise reviewed and negative.  Physical Examination Filed Vitals:   03/02/11 0859  BP: 149/78  Pulse: 77    Obese woman in acute distress.  HEENT: Conjunctiva and lids normal, oropharynx clear.  Neck: Supple, no elevated JVP.  Lungs: Clear auscultation, nonlabored.  Cardiac: Regular rate and rhythm, no S3.  Abdomen: Soft, nontender.  Extremities: Chronic stasis with edema.  Skin: Warm and dry.  Musculoskeletal: No gross deformities.  Neuropsychiatric: Alert and oriented x3, affect appropriate.   ECG Sinus rhythm at 77 with PACs, left bundle branch block pattern, QRS 128 ms.    Problem List and Plan

## 2011-03-02 NOTE — Assessment & Plan Note (Signed)
Continue present regimen, sodium restriction.

## 2011-03-02 NOTE — Assessment & Plan Note (Signed)
Symptomatically stable on beta blocker therapy. Continue observation.

## 2011-03-02 NOTE — Patient Instructions (Signed)
Continue all current medications. Your physician wants you to follow up in: 6 months.  You will receive a reminder letter in the mail one-two months in advance.  If you don't receive a letter, please call our office to schedule the follow up appointment   

## 2011-03-03 ENCOUNTER — Other Ambulatory Visit: Payer: Self-pay | Admitting: *Deleted

## 2011-03-03 MED ORDER — METOPROLOL TARTRATE 50 MG PO TABS: 50.0000 mg | ORAL_TABLET | Freq: Two times a day (BID) | ORAL | Status: DC

## 2011-04-19 ENCOUNTER — Telehealth: Payer: Self-pay | Admitting: Cardiology

## 2011-04-19 NOTE — Telephone Encounter (Signed)
[name] heard on the news this past weekend that Lipitor was being taken off the market. She is concerned If she is to continue taking this medication.

## 2011-04-20 ENCOUNTER — Telehealth: Payer: Self-pay | Admitting: Cardiology

## 2011-04-20 NOTE — Telephone Encounter (Signed)
Patient informed. 

## 2011-04-20 NOTE — Telephone Encounter (Signed)
I have not heard this from any professional source.

## 2011-04-20 NOTE — Telephone Encounter (Signed)
Patient informed that medication hasn't been taken off the market and that this medication can be crushed.

## 2011-04-20 NOTE — Telephone Encounter (Signed)
Kynzleigh stop by this morning to see if you had chance to speak to doctor about Atorvastatin also she would like to know if she can crush the pill. Call [phone]

## 2011-08-30 ENCOUNTER — Encounter: Payer: Self-pay | Admitting: Cardiology

## 2011-08-30 ENCOUNTER — Ambulatory Visit (INDEPENDENT_AMBULATORY_CARE_PROVIDER_SITE_OTHER): Payer: Medicare Other | Admitting: Cardiology

## 2011-08-30 VITALS — BP 160/73 | HR 82 | Ht 66.0 in | Wt 205.0 lb

## 2011-08-30 DIAGNOSIS — R002 Palpitations: Secondary | ICD-10-CM

## 2011-08-30 DIAGNOSIS — I447 Left bundle-branch block, unspecified: Secondary | ICD-10-CM

## 2011-08-30 DIAGNOSIS — I10 Essential (primary) hypertension: Secondary | ICD-10-CM

## 2011-08-30 NOTE — Patient Instructions (Signed)
Your physician wants you to follow-up in: 6 months. You will receive a reminder letter in the mail one-two months in advance. If you don't receive a letter, please call our office to schedule the follow-up appointment. Your physician recommends that you continue on your current medications as directed. Please refer to the Current Medication list given to you today. 

## 2011-08-30 NOTE — Assessment & Plan Note (Signed)
No progressive symptoms on beta blocker therapy. Continue observation.

## 2011-08-30 NOTE — Progress Notes (Signed)
   Clinical Summary [name] is an 78 y.o.female presenting for followup. She was seen in October 2012. She is here with her husband. She reports compliance with her medications, no significant palpitations since last visit.  Blood pressure is elevated today. She states that when she checks it at home her systolics are usually in the 120s or 130s. We discussed sodium restriction and diet.  She reports no chest pain or breathlessness.   No Known Allergies  Current Outpatient Prescriptions  Medication Sig Dispense Refill  . ALPRAZolam (XANAX) 0.25 MG tablet Take 0.25 mg by mouth at bedtime as needed.        Marland Kitchen amLODipine (NORVASC) 10 MG tablet Take 10 mg by mouth daily.        Marland Kitchen aspirin 81 MG tablet Take 81 mg by mouth daily.        Marland Kitchen atorvastatin (LIPITOR) 40 MG tablet Take 1 tablet (40 mg total) by mouth daily.  30 tablet  6  . calcium carbonate (OS-CAL) 600 MG TABS Take 600 mg by mouth daily.        . cetirizine (ZYRTEC) 10 MG tablet Take 5 mg by mouth daily as needed.       . Chlorpheniramine-APAP (CORICIDIN) 2-325 MG TABS Take by mouth as needed.        . famotidine (PEPCID) 20 MG tablet Take 20 mg by mouth daily.        . fish oil-omega-3 fatty acids 1000 MG capsule Take 2 g by mouth daily.        . furosemide (LASIX) 20 MG tablet Take 20 mg by mouth daily.        Marland Kitchen glyBURIDE (DIABETA) 5 MG tablet Take 5 mg by mouth daily with breakfast.        . levothyroxine (SYNTHROID, LEVOTHROID) 50 MCG tablet Take 1 tablet by mouth Daily.      . metoprolol (LOPRESSOR) 50 MG tablet Take 1 tablet (50 mg total) by mouth 2 (two) times daily.  60 tablet  6  . Multiple Vitamins-Minerals (MULTIVITAMIN WITH MINERALS) tablet Take 1 tablet by mouth daily.        . potassium chloride SA (K-DUR,KLOR-CON) 20 MEQ tablet Take 20 mEq by mouth daily.        Marland Kitchen PROAIR HFA 108 (90 BASE) MCG/ACT inhaler as directed.      . quinapril (ACCUPRIL) 20 MG tablet Take 1 tablet (20 mg total) by mouth daily.  30 tablet  6      Past Medical History  Diagnosis Date  . Hypothyroidism   . Hypertension   . Type 2 diabetes mellitus   . Palpitations     PAT/EAT/NSVT, normal LVEF  . Mitral regurgitation     Mild    Social History [name] reports that she has never smoked. She has never used smokeless tobacco. [name] reports that she does not drink alcohol.  Review of Systems As outlined above.  Physical Examination Filed Vitals:   08/30/11 1349  BP: 160/73  Pulse: 82   Obese woman in acute distress.  HEENT: Conjunctiva and lids normal, oropharynx clear.  Neck: Supple, no elevated JVP.  Lungs: Clear auscultation, nonlabored.  Cardiac: Regular rate and rhythm, no S3.  Abdomen: Soft, nontender.  Extremities: Chronic stasis with edema.  Skin: Warm and dry.  Musculoskeletal: No gross deformities.  Neuropsychiatric: Alert and oriented x3, affect appropriate.    Problem List and Plan

## 2011-08-30 NOTE — Assessment & Plan Note (Signed)
Probable conduction system disease, following for now.

## 2011-08-30 NOTE — Assessment & Plan Note (Signed)
Blood pressure elevated today, although sounds better controlled by home checks. We discussed diet, sodium restriction, medications. No changes made today.

## 2011-09-27 ENCOUNTER — Other Ambulatory Visit: Payer: Self-pay | Admitting: *Deleted

## 2011-09-27 MED ORDER — ATORVASTATIN CALCIUM 40 MG PO TABS: 40.0000 mg | ORAL_TABLET | Freq: Every day | ORAL | Status: DC

## 2011-10-28 ENCOUNTER — Other Ambulatory Visit: Payer: Self-pay | Admitting: *Deleted

## 2011-10-28 MED ORDER — QUINAPRIL HCL 20 MG PO TABS: 20.0000 mg | ORAL_TABLET | Freq: Every day | ORAL | Status: DC

## 2011-10-28 MED ORDER — METOPROLOL TARTRATE 50 MG PO TABS: 50.0000 mg | ORAL_TABLET | Freq: Two times a day (BID) | ORAL | Status: DC

## 2011-11-03 ENCOUNTER — Other Ambulatory Visit: Payer: Self-pay | Admitting: *Deleted

## 2011-11-03 NOTE — Telephone Encounter (Signed)
Called [facility] and patient has refills on all of her medications except lipitor and Bonita Quin will send to [employer] office since he checks her lipid labs.

## 2012-02-07 ENCOUNTER — Other Ambulatory Visit (HOSPITAL_COMMUNITY): Payer: Self-pay | Admitting: Family Medicine

## 2012-02-07 ENCOUNTER — Ambulatory Visit (HOSPITAL_COMMUNITY)
Admission: RE | Admit: 2012-02-07 | Discharge: 2012-02-07 | Disposition: A | Discharge status: Home / Self Care | Payer: Medicare Other | Source: Ambulatory Visit | Attending: Family Medicine | Admitting: Family Medicine

## 2012-02-07 DIAGNOSIS — M7989 Other specified soft tissue disorders: Secondary | ICD-10-CM | POA: Insufficient documentation

## 2012-02-07 DIAGNOSIS — M79609 Pain in unspecified limb: Secondary | ICD-10-CM | POA: Insufficient documentation

## 2012-02-07 DIAGNOSIS — R52 Pain, unspecified: Secondary | ICD-10-CM

## 2012-02-28 ENCOUNTER — Ambulatory Visit: Payer: Medicare Other | Admitting: Cardiology

## 2012-03-15 ENCOUNTER — Ambulatory Visit (INDEPENDENT_AMBULATORY_CARE_PROVIDER_SITE_OTHER): Payer: Medicare Other | Admitting: Orthopedic Surgery

## 2012-03-15 ENCOUNTER — Ambulatory Visit (INDEPENDENT_AMBULATORY_CARE_PROVIDER_SITE_OTHER): Payer: Medicare Other

## 2012-03-15 ENCOUNTER — Encounter: Payer: Self-pay | Admitting: Orthopedic Surgery

## 2012-03-15 VITALS — BP 160/70 | Ht 66.0 in | Wt 207.0 lb

## 2012-03-15 DIAGNOSIS — G5790 Unspecified mononeuropathy of unspecified lower limb: Secondary | ICD-10-CM

## 2012-03-15 DIAGNOSIS — M792 Neuralgia and neuritis, unspecified: Secondary | ICD-10-CM

## 2012-03-15 DIAGNOSIS — M25569 Pain in unspecified knee: Secondary | ICD-10-CM

## 2012-03-15 MED ORDER — GABAPENTIN 100 MG PO CAPS: 100.0000 mg | ORAL_CAPSULE | Freq: Every evening | ORAL | Status: DC | PRN

## 2012-03-15 MED ORDER — ACETAMINOPHEN-CODEINE #3 300-30 MG PO TABS: 1.0000 | ORAL_TABLET | Freq: Four times a day (QID) | ORAL | Status: DC

## 2012-03-15 NOTE — Patient Instructions (Signed)
Start gabapentin and tylenol with codeine

## 2012-03-15 NOTE — Progress Notes (Signed)
Patient ID: KENNEDII ARTIST, female   DOB: 07-28-1926, 78 y.o.   MRN: [id number] Chief Complaint  Patient presents with  . Knee Pain    right knee pain and swelling, sudden onset x 1 month, no known injury    The patient presents with history of posterior right knee pain and swelling for about a month. Came on suddenly unrelieved by Tylenol described as sharp. Denies back pain complains of 8/10 pain worse with walking and standing associated with swelling. X-ray shows mild degenerative change especially considering her age of 78. Review of systems weight gain palpitations in the chest shortness of breath urgency nervousness seasonal allergies  Past Medical History  Diagnosis Date  . Hypothyroidism   . Hypertension   . Type 2 diabetes mellitus   . Palpitations     PAT/EAT/NSVT, normal LVEF  . Mitral regurgitation     Mild    History reviewed    BP 160/70  Ht 5\' 6"  (1.676 m)  Wt 207 lb (93.895 kg)  BMI 33.41 kg/m2  Physical Exam  Nursing note and vitals reviewed. Constitutional: She is oriented to person, place, and time. She appears well-developed and well-nourished.  HENT:  Head: Normocephalic.  Neck: Neck supple.  Cardiovascular: Intact distal pulses.   Pulmonary/Chest: No respiratory distress.  Abdominal: She exhibits no distension.  Neurological: She is alert and oriented to person, place, and time. She has normal reflexes. She displays normal reflexes. She exhibits normal muscle tone. Coordination normal.  Skin: Skin is warm and dry. No rash noted. No erythema. No pallor.  Psychiatric: She has a normal mood and affect. Her behavior is normal. Judgment and thought content normal.  Ortho Exam  Memory seems confused often needs cues to go back to the topic and question it hand.  Denies back pain no back tenderness  No knee tenderness no joint swelling. Flexion 115 no pain no painful ARC of motion collateral ligament stable cruciate ligament stable motor exam normal no  pain in the back of the knee no tenderness  Impression after x-ray review I believe that her pain is referred and is not related to the joint at all. Recommend Neurontin and codeine for pain for neurogenic symptoms

## 2012-04-11 ENCOUNTER — Encounter: Payer: Self-pay | Admitting: Cardiology

## 2012-04-11 ENCOUNTER — Ambulatory Visit (INDEPENDENT_AMBULATORY_CARE_PROVIDER_SITE_OTHER): Payer: Medicare Other | Admitting: Cardiology

## 2012-04-11 VITALS — BP 177/75 | HR 85 | Wt 205.8 lb

## 2012-04-11 DIAGNOSIS — I10 Essential (primary) hypertension: Secondary | ICD-10-CM

## 2012-04-11 DIAGNOSIS — R002 Palpitations: Secondary | ICD-10-CM

## 2012-04-11 NOTE — Progress Notes (Signed)
Clinical Summary [name] is an 78 y.o.female presenting for followup. She was seen in April. She reports no significant palpitations, has been taking her metoprolol as before.  In the interim she was diagnosed with neuropathic leg pain, states it is much better on gabapentin and analgesics.  ECG today shows sinus rhythm with LBBB and PACs.   No Known Allergies  Current Outpatient Prescriptions  Medication Sig Dispense Refill  . acetaminophen-codeine (TYLENOL #3) 300-30 MG per tablet Take 1 tablet by mouth every 6 (six) hours.  60 tablet  5  . ALPRAZolam (XANAX) 0.25 MG tablet Take 0.25 mg by mouth at bedtime as needed.        Marland Kitchen amLODipine (NORVASC) 10 MG tablet Take 10 mg by mouth daily.        Marland Kitchen aspirin 81 MG tablet Take 81 mg by mouth daily.        Marland Kitchen atorvastatin (LIPITOR) 40 MG tablet Take 1 tablet (40 mg total) by mouth daily.  30 tablet  0  . calcium carbonate (OS-CAL) 600 MG TABS Take 600 mg by mouth daily.        . cetirizine (ZYRTEC) 10 MG tablet Take 5 mg by mouth daily as needed.       . famotidine (PEPCID) 20 MG tablet Take 20 mg by mouth daily.        . fish oil-omega-3 fatty acids 1000 MG capsule Take 2 g by mouth daily.        . furosemide (LASIX) 20 MG tablet Take 20 mg by mouth daily.        Marland Kitchen gabapentin (NEURONTIN) 100 MG capsule Take 1 capsule (100 mg total) by mouth at bedtime as needed.  90 capsule  2  . glyBURIDE (DIABETA) 5 MG tablet Take 5 mg by mouth daily with breakfast.        . levothyroxine (SYNTHROID, LEVOTHROID) 50 MCG tablet Take 1 tablet by mouth Daily.      . metoprolol (LOPRESSOR) 50 MG tablet Take 1 tablet (50 mg total) by mouth 2 (two) times daily.  60 tablet  6  . Multiple Vitamins-Minerals (MULTIVITAMIN WITH MINERALS) tablet Take 1 tablet by mouth daily.        . potassium chloride SA (K-DUR,KLOR-CON) 20 MEQ tablet Take 20 mEq by mouth daily.        Marland Kitchen PROAIR HFA 108 (90 BASE) MCG/ACT inhaler as directed.      . quinapril (ACCUPRIL) 20 MG  tablet Take 1 tablet (20 mg total) by mouth daily.  30 tablet  6    Past Medical History  Diagnosis Date  . Hypothyroidism   . Hypertension   . Type 2 diabetes mellitus   . Palpitations     PAT/EAT/NSVT, normal LVEF  . Mitral regurgitation     Mild    Social History [name] reports that she has never smoked. She has never used smokeless tobacco. [name] reports that she does not drink alcohol.  Review of Systems No falls, dizziness, or syncope.  Physical Examination Filed Vitals:   04/11/12 1450  BP: 177/75  Pulse: 85   Filed Weights   04/11/12 1450  Weight: 205 lb 12.8 oz (93.35 kg)    HEENT: Conjunctiva and lids normal, oropharynx clear.  Neck: Supple, no elevated JVP.  Lungs: Clear auscultation, nonlabored.  Cardiac: Regular rate and rhythm with ectopic beats, no S3.  Abdomen: Soft, nontender.  Extremities: Chronic stasis with edema/lymphedema..    Problem List and Plan  PALPITATIONS Symptomatically stable, PACs noted by followup ECG. No change to current regimen including beta blocker.  ESSENTIAL HYPERTENSION, BENIGN Blood pressure is up today. She reports compliance with her medications. We discussed sodium restriction. Keep followup with [name].    Jonelle Sidle, M.D., F.A.C.C.

## 2012-04-11 NOTE — Assessment & Plan Note (Signed)
Blood pressure is up today. She reports compliance with her medications. We discussed sodium restriction. Keep followup with Dr. Sudie Bailey.

## 2012-04-11 NOTE — Assessment & Plan Note (Signed)
Symptomatically stable, PACs noted by followup ECG. No change to current regimen including beta blocker.

## 2012-04-11 NOTE — Patient Instructions (Addendum)

## 2012-05-26 ENCOUNTER — Other Ambulatory Visit: Payer: Self-pay | Admitting: Cardiology

## 2012-06-26 ENCOUNTER — Other Ambulatory Visit: Payer: Self-pay | Admitting: *Deleted

## 2012-06-26 MED ORDER — METOPROLOL TARTRATE 50 MG PO TABS: 50.0000 mg | ORAL_TABLET | Freq: Two times a day (BID) | ORAL | Status: DC

## 2012-10-13 ENCOUNTER — Encounter: Payer: Self-pay | Admitting: Cardiology

## 2012-10-13 ENCOUNTER — Ambulatory Visit (INDEPENDENT_AMBULATORY_CARE_PROVIDER_SITE_OTHER): Payer: Medicare Other | Admitting: Cardiology

## 2012-10-13 VITALS — BP 169/76 | HR 74 | Ht 66.0 in | Wt 201.4 lb

## 2012-10-13 DIAGNOSIS — R002 Palpitations: Secondary | ICD-10-CM

## 2012-10-13 DIAGNOSIS — I10 Essential (primary) hypertension: Secondary | ICD-10-CM

## 2012-10-13 DIAGNOSIS — E785 Hyperlipidemia, unspecified: Secondary | ICD-10-CM

## 2012-10-13 NOTE — Assessment & Plan Note (Signed)
Recent lab work reviewed\, lipids are well controlled.

## 2012-10-13 NOTE — Progress Notes (Signed)
Clinical Summary [name] is an 79 y.o.female last seen in November 2013. She states that she has been doing well, no significant palpitations or dizziness. Reports compliance with her medications including beta blocker. States that she just recently plan a garden. Has been caring for her husband who has had health issues, and is no longer driving.  Lab work from February showed cholesterol 152, HDL 71, triglycerides 51, LDL 71, BUN 12, creatinine 0.8, potassium 4.8, AST 24, ALT 22.  Blood pressure is elevated today. She maintains followup with Dr. Sudie Bailey.  No Known Allergies  Current Outpatient Prescriptions  Medication Sig Dispense Refill  . acetaminophen-codeine (TYLENOL #3) 300-30 MG per tablet Take 1 tablet by mouth every 6 (six) hours.  60 tablet  5  . ALPRAZolam (XANAX) 0.25 MG tablet Take 0.25 mg by mouth at bedtime as needed.        Marland Kitchen amLODipine (NORVASC) 10 MG tablet Take 10 mg by mouth daily.        Marland Kitchen aspirin 81 MG tablet Take 81 mg by mouth daily.        Marland Kitchen atorvastatin (LIPITOR) 40 MG tablet Take 1 tablet (40 mg total) by mouth daily.  30 tablet  0  . Calcium Carbonate-Vitamin D (CALCIUM + D PO) Take by mouth daily.      . cetirizine (ZYRTEC) 10 MG tablet Take 5 mg by mouth daily as needed.       . famotidine (PEPCID) 20 MG tablet Take 20 mg by mouth daily.        . fish oil-omega-3 fatty acids 1000 MG capsule Take 1 g by mouth daily.       . furosemide (LASIX) 20 MG tablet Take 20 mg by mouth daily.        Marland Kitchen gabapentin (NEURONTIN) 100 MG capsule Take 100 mg by mouth at bedtime.      Marland Kitchen glimepiride (AMARYL) 2 MG tablet Take 2 mg by mouth daily before breakfast.      . glyBURIDE (DIABETA) 5 MG tablet Take 5 mg by mouth daily with breakfast.        . levothyroxine (SYNTHROID, LEVOTHROID) 50 MCG tablet Take 1 tablet by mouth Daily.      . metoprolol (LOPRESSOR) 50 MG tablet Take 1 tablet (50 mg total) by mouth 2 (two) times daily.  60 tablet  6  . Multiple Vitamins-Minerals  (MULTIVITAMIN WITH MINERALS) tablet Take 1 tablet by mouth daily.        . potassium chloride SA (K-DUR,KLOR-CON) 20 MEQ tablet Take 20 mEq by mouth daily.        Marland Kitchen PROAIR HFA 108 (90 BASE) MCG/ACT inhaler as directed.      . quinapril (ACCUPRIL) 20 MG tablet TAKE 1 TABLET BY MOUTH EVERY DAY  30 tablet  6   No current facility-administered medications for this visit.    Past Medical History  Diagnosis Date  . Hypothyroidism   . Hypertension   . Type 2 diabetes mellitus   . Palpitations     PAT/EAT/NSVT, normal LVEF  . Mitral regurgitation     Mild    Social History [name] reports that she has never smoked. She has never used smokeless tobacco. [name] reports that she does not drink alcohol.  Review of Systems Chronic arthritic pain. Stable appetite. No syncope. Otherwise negative.  Physical Examination Filed Vitals:   10/13/12 0842  BP: 169/76  Pulse: 74   Filed Weights   10/13/12 0842  Weight:  201 lb 6.4 oz (91.354 kg)   No acute distress. HEENT: Conjunctiva and lids normal, oropharynx clear.  Neck: Supple, no elevated JVP.  Lungs: Clear auscultation, nonlabored.  Cardiac: Regular rate and rhythm with ectopic beats, no S3.  Abdomen: Soft, nontender.  Extremities: Chronic stasis with edema/lymphedema..    Problem List and Plan   Palpitations Stable on medical therapy. ECG shows sinus rhythm with left bundle branch block which is old. No change to current regimen.  HYPERLIPIDEMIA-MIXED Recent lab work reviewed\, lipids are well controlled.  ESSENTIAL HYPERTENSION, BENIGN Blood pressure elevated today, keep followup with Dr. Sudie Bailey. She reports compliance with her medications.    Jonelle Sidle, M.D., F.A.C.C.

## 2012-10-13 NOTE — Assessment & Plan Note (Signed)
Blood pressure elevated today, keep followup with Dr. Sudie Bailey. She reports compliance with her medications.

## 2012-10-13 NOTE — Patient Instructions (Addendum)
Your physician wants you to follow-up in: 6 Months You will receive a reminder letter in the mail two months in advance. If you don't receive a letter, please call our office to schedule the follow-up appointment.  Your physician recommends that you continue on your current medications as directed. Please refer to the Current Medication list given to you today.   

## 2012-10-13 NOTE — Assessment & Plan Note (Signed)
Stable on medical therapy. ECG shows sinus rhythm with left bundle branch block which is old. No change to current regimen.

## 2012-12-13 ENCOUNTER — Other Ambulatory Visit: Payer: Self-pay | Admitting: Cardiology

## 2012-12-13 MED ORDER — QUINAPRIL HCL 20 MG PO TABS: 20.0000 mg | ORAL_TABLET | Freq: Every day | ORAL | Status: DC

## 2012-12-13 MED ORDER — METOPROLOL TARTRATE 50 MG PO TABS: 50.0000 mg | ORAL_TABLET | Freq: Two times a day (BID) | ORAL | Status: DC

## 2013-04-25 ENCOUNTER — Encounter: Payer: Self-pay | Admitting: Cardiology

## 2013-04-25 ENCOUNTER — Ambulatory Visit (INDEPENDENT_AMBULATORY_CARE_PROVIDER_SITE_OTHER): Payer: Medicare Other | Admitting: Cardiology

## 2013-04-25 VITALS — BP 182/69 | HR 75 | Ht 66.0 in | Wt 200.8 lb

## 2013-04-25 DIAGNOSIS — I10 Essential (primary) hypertension: Secondary | ICD-10-CM

## 2013-04-25 DIAGNOSIS — R002 Palpitations: Secondary | ICD-10-CM

## 2013-04-25 DIAGNOSIS — I059 Rheumatic mitral valve disease, unspecified: Secondary | ICD-10-CM

## 2013-04-25 NOTE — Assessment & Plan Note (Signed)
Blood pressure elevated today. Patient reports compliance with her medications. Keep followup with Dr. Sudie Bailey.

## 2013-04-25 NOTE — Assessment & Plan Note (Signed)
No change in pattern or intensity on beta blocker therapy. Continue observation. 

## 2013-04-25 NOTE — Progress Notes (Signed)
Clinical Summary [name] is an 79 y.o.female last seen in May. She is doing well from a cardiac perspective, no significant palpitations on beta blocker therapy. She has had some other health concerns, being evaluated for anemia at the current time by her primary care physician. Also caring for her husband who has lost his eyesight. States her daughter recently had a stroke in her 63s, will be moving back home from [place].  She reports compliance with her medications. Has not noted any obvious bleeding problems. She raised her garden this summer as usual. No progressive exertional symptoms.   No Known Allergies  Current Outpatient Prescriptions  Medication Sig Dispense Refill  . acetaminophen-codeine (TYLENOL #3) 300-30 MG per tablet Take 1 tablet by mouth every 6 (six) hours.  60 tablet  5  . ALPRAZolam (XANAX) 0.25 MG tablet Take 0.25 mg by mouth at bedtime as needed.        Marland Kitchen amLODipine (NORVASC) 10 MG tablet Take 10 mg by mouth daily.        Marland Kitchen aspirin 81 MG tablet Take 81 mg by mouth daily.        Marland Kitchen atorvastatin (LIPITOR) 40 MG tablet Take 1 tablet (40 mg total) by mouth daily.  30 tablet  0  . Calcium Carbonate-Vitamin D (CALCIUM + D PO) Take by mouth daily.      . cetirizine (ZYRTEC) 10 MG tablet Take 5 mg by mouth daily as needed.       . famotidine (PEPCID) 20 MG tablet Take 20 mg by mouth daily.        . fish oil-omega-3 fatty acids 1000 MG capsule Take 1 g by mouth daily.       . furosemide (LASIX) 20 MG tablet Take 20 mg by mouth daily.        Marland Kitchen gabapentin (NEURONTIN) 100 MG capsule Take 100 mg by mouth at bedtime.      Marland Kitchen glimepiride (AMARYL) 2 MG tablet Take 2 mg by mouth daily before breakfast.      . levothyroxine (SYNTHROID, LEVOTHROID) 50 MCG tablet Take 0.5 tablets by mouth Daily.       . metoprolol (LOPRESSOR) 50 MG tablet Take 1 tablet (50 mg total) by mouth 2 (two) times daily.  60 tablet  6  . Multiple Vitamins-Minerals (MULTIVITAMIN WITH MINERALS) tablet  Take 1 tablet by mouth daily.        . potassium chloride SA (K-DUR,KLOR-CON) 20 MEQ tablet Take 20 mEq by mouth daily.        Marland Kitchen PROAIR HFA 108 (90 BASE) MCG/ACT inhaler Inhale 1-2 puffs into the lungs as needed.       . quinapril (ACCUPRIL) 20 MG tablet Take 1 tablet (20 mg total) by mouth daily.  30 tablet  6   No current facility-administered medications for this visit.    Past Medical History  Diagnosis Date  . Hypothyroidism   . Hypertension   . Type 2 diabetes mellitus   . Palpitations     PAT/EAT/NSVT, normal LVEF  . Mitral regurgitation     Mild    Social History [name] reports that she has never smoked. She has never used smokeless tobacco. [name] reports that she does not drink alcohol.  Review of Systems As outlined above.  Physical Examination Filed Vitals:   04/25/13 1321  BP: 182/69  Pulse: 75   Filed Weights   04/25/13 1321  Weight: 200 lb 12.8 oz (91.082 kg)  No acute distress.  HEENT: Conjunctiva and lids normal, oropharynx clear.  Neck: Supple, no elevated JVP.  Lungs: Clear auscultation, nonlabored.  Cardiac: Regular rate and rhythm, no S3.  Abdomen: Soft, nontender.  Extremities: Chronic stasis with edema/lymphedema..    Problem List and Plan   Palpitations No change in pattern or intensity on beta blocker therapy. Continue observation.  Mitral valve disorders Mild mitral regurgitation, no change in examination.  Essential hypertension, benign Blood pressure elevated today. Patient reports compliance with her medications. Keep followup with Dr. Sudie Bailey.    Jonelle Sidle, M.D., F.A.C.C.

## 2013-04-25 NOTE — Assessment & Plan Note (Signed)
Mild mitral regurgitation, no change in examination.

## 2013-04-25 NOTE — Patient Instructions (Signed)

## 2013-08-07 ENCOUNTER — Encounter (HOSPITAL_COMMUNITY): Payer: Self-pay | Admitting: Emergency Medicine

## 2013-08-07 ENCOUNTER — Emergency Department (HOSPITAL_COMMUNITY)
Admission: EM | Admit: 2013-08-07 | Discharge: 2013-08-07 | Disposition: A | Discharge status: Home / Self Care | Payer: Medicare Other | Attending: Emergency Medicine | Admitting: Emergency Medicine

## 2013-08-07 DIAGNOSIS — E1169 Type 2 diabetes mellitus with other specified complication: Secondary | ICD-10-CM | POA: Insufficient documentation

## 2013-08-07 DIAGNOSIS — R5381 Other malaise: Secondary | ICD-10-CM | POA: Insufficient documentation

## 2013-08-07 DIAGNOSIS — I10 Essential (primary) hypertension: Secondary | ICD-10-CM | POA: Insufficient documentation

## 2013-08-07 DIAGNOSIS — Z79899 Other long term (current) drug therapy: Secondary | ICD-10-CM | POA: Insufficient documentation

## 2013-08-07 DIAGNOSIS — E162 Hypoglycemia, unspecified: Secondary | ICD-10-CM

## 2013-08-07 DIAGNOSIS — Z7982 Long term (current) use of aspirin: Secondary | ICD-10-CM | POA: Insufficient documentation

## 2013-08-07 DIAGNOSIS — I498 Other specified cardiac arrhythmias: Secondary | ICD-10-CM | POA: Insufficient documentation

## 2013-08-07 DIAGNOSIS — E039 Hypothyroidism, unspecified: Secondary | ICD-10-CM | POA: Insufficient documentation

## 2013-08-07 DIAGNOSIS — R5383 Other fatigue: Secondary | ICD-10-CM

## 2013-08-07 LAB — URINALYSIS, ROUTINE W REFLEX MICROSCOPIC
BILIRUBIN URINE: NEGATIVE
Glucose, UA: NEGATIVE mg/dL
Hgb urine dipstick: NEGATIVE
KETONES UR: NEGATIVE mg/dL
NITRITE: NEGATIVE
PROTEIN: NEGATIVE mg/dL
Specific Gravity, Urine: 1.02 (ref 1.005–1.030)
UROBILINOGEN UA: 0.2 mg/dL (ref 0.0–1.0)
pH: 5.5 (ref 5.0–8.0)

## 2013-08-07 LAB — CBC WITH DIFFERENTIAL/PLATELET
BASOS ABS: 0 10*3/uL (ref 0.0–0.1)
BASOS PCT: 0 % (ref 0–1)
EOS ABS: 0 10*3/uL (ref 0.0–0.7)
Eosinophils Relative: 0 % (ref 0–5)
HCT: 37.8 % (ref 36.0–46.0)
HEMOGLOBIN: 12.6 g/dL (ref 12.0–15.0)
Lymphocytes Relative: 15 % (ref 12–46)
Lymphs Abs: 1.1 10*3/uL (ref 0.7–4.0)
MCH: 28.6 pg (ref 26.0–34.0)
MCHC: 33.3 g/dL (ref 30.0–36.0)
MCV: 85.7 fL (ref 78.0–100.0)
MONO ABS: 0.3 10*3/uL (ref 0.1–1.0)
MONOS PCT: 5 % (ref 3–12)
NEUTROS PCT: 80 % — AB (ref 43–77)
Neutro Abs: 5.7 10*3/uL (ref 1.7–7.7)
Platelets: 268 10*3/uL (ref 150–400)
RBC: 4.41 MIL/uL (ref 3.87–5.11)
RDW: 14.1 % (ref 11.5–15.5)
WBC: 7.1 10*3/uL (ref 4.0–10.5)

## 2013-08-07 LAB — BASIC METABOLIC PANEL
BUN: 12 mg/dL (ref 6–23)
CO2: 22 mEq/L (ref 19–32)
CREATININE: 0.67 mg/dL (ref 0.50–1.10)
Calcium: 9.3 mg/dL (ref 8.4–10.5)
Chloride: 96 mEq/L (ref 96–112)
GFR, EST NON AFRICAN AMERICAN: 79 mL/min — AB (ref >90)
Glucose, Bld: 192 mg/dL — ABNORMAL HIGH (ref 70–99)
POTASSIUM: 3.8 meq/L (ref 3.7–5.3)
Sodium: 132 mEq/L — ABNORMAL LOW (ref 137–147)

## 2013-08-07 LAB — CBG MONITORING, ED
GLUCOSE-CAPILLARY: 107 mg/dL — AB (ref 70–99)
GLUCOSE-CAPILLARY: 225 mg/dL — AB (ref 70–99)
GLUCOSE-CAPILLARY: 94 mg/dL (ref 70–99)

## 2013-08-07 LAB — URINE MICROSCOPIC-ADD ON

## 2013-08-07 NOTE — ED Notes (Signed)
Patient placed on cardiac monitor and continuous pulse oximetry

## 2013-08-07 NOTE — Discharge Instructions (Signed)
Make sure that you eat your meals, even if you don't feel like it.   Hypoglycemia (Low Blood Sugar) Hypoglycemia is when the glucose (sugar) in your blood is too low. Hypoglycemia can happen for many reasons. It can happen to people with or without diabetes. Hypoglycemia can develop quickly and can be a medical emergency.  CAUSES  Having hypoglycemia does not mean that you will develop diabetes. Different causes include:  Missed or delayed meals or not enough carbohydrates eaten.  Medication overdose. This could be by accident or deliberate. If by accident, your medication may need to be adjusted or changed.  Exercise or increased activity without adjustments in carbohydrates or medications.  A nerve disorder that affects body functions like your heart rate, blood pressure and digestion (autonomic neuropathy).  A condition where the stomach muscles do not function properly (gastroparesis). Therefore, medications may not absorb properly.  The inability to recognize the signs of hypoglycemia (hypoglycemic unawareness).  Absorption of insulin  may be altered.  Alcohol consumption.  Pregnancy/menstrual cycles/postpartum. This may be due to hormones.  Certain kinds of tumors. This is very rare. SYMPTOMS   Sweating.  Hunger.  Dizziness.  Blurred vision.  Drowsiness.  Weakness.  Headache.  Rapid heart beat.  Shakiness.  Nervousness. DIAGNOSIS  Diagnosis is made by monitoring blood glucose in one or all of the following ways:  Fingerstick blood glucose monitoring.  Laboratory results. TREATMENT  If you think your blood glucose is low:  Check your blood glucose, if possible. If it is less than 70 mg/dl, take one of the following:  3-4 glucose tablets.   cup juice (prefer clear like apple).   cup "regular" soda pop.  1 cup milk.  -1 tube of glucose gel.  5-6 hard candies.  Do not over treat because your blood glucose (sugar) will only go too  high.  Wait 15 minutes and recheck your blood glucose. If it is still less than 70 mg/dl (or below your target range), repeat treatment.  Eat a snack if it is more than one hour until your next meal. Sometimes, your blood glucose may go so low that you are unable to treat yourself. You may need someone to help you. You may even pass out or be unable to swallow. This may require you to get an injection of glucagon, which raises the blood glucose. HOME CARE INSTRUCTIONS  Check blood glucose as recommended by your caregiver.  Take medication as prescribed by your caregiver.  Follow your meal plan. Do not skip meals. Eat on time.  If you are going to drink alcohol, drink it only with meals.  Check your blood glucose before driving.  Check your blood glucose before and after exercise. If you exercise longer or different than usual, be sure to check blood glucose more frequently.  Always carry treatment with you. Glucose tablets are the easiest to carry.  Always wear medical alert jewelry or carry some form of identification that states that you have diabetes. This will alert people that you have diabetes. If you have hypoglycemia, they will have a better idea on what to do. SEEK MEDICAL CARE IF:   You are having problems keeping your blood sugar at target range.  You are having frequent episodes of hypoglycemia.  You feel you might be having side effects from your medicines.  You have symptoms of an illness that is not improving after 3-4 days.  You notice a change in vision or a new problem with your vision.  SEEK IMMEDIATE MEDICAL CARE IF:   You are a family member or friend of a person whose blood glucose goes below 70 mg/dl and is accompanied by:  Confusion.  A change in mental status.  The inability to swallow.  Passing out. Document Released: 05/10/2005 Document Revised: 08/02/2011 Document Reviewed: 09/06/2011 [facility] Patient Information 2014 [place], [place].

## 2013-08-07 NOTE — ED Notes (Signed)
Pt states her blood sugar was 56 and she drank orange juice then her sugar jumped to 300.

## 2013-08-07 NOTE — ED Provider Notes (Signed)
CSN: [id number]     Arrival date & time 08/07/13  0017 History  This chart was scribed for Dione Booze, MD by Dorothey Baseman, ED Scribe. This patient was seen in room APA04/APA04 and the patient's care was started at 12:52 AM.    Chief Complaint  Patient presents with  . Hyperglycemia   The history is provided by the patient. No language interpreter was used.   HPI Comments: [name] is a 80 y.o. female with a history of type II DM who presents to the Emergency Department complaining of hyperglycemia. Patient reports that she measured her CBG at home and it was 56, so she drank some orange juice, causing her CBG to increase to 300, which has since decreased to 225 upon arrival to the ED. She reports some associated diffuse weakness prior to checking her CBG initially, but states that it was time for her to check it anyway. Patient states that she did not eat as much as usual today, but is unable to specify why. She denies diaphoresis, dizziness, palpitations. Patient also has a history of hypothyroidism, HTN, and palpitations. Patient is also noted to have a BP of 201/77 upon arrival to the ED.   PCP- Dr. John Giovanni Cardiologist- Dr. Diona Browner  Past Medical History  Diagnosis Date  . Hypothyroidism   . Hypertension   . Type 2 diabetes mellitus   . Palpitations     PAT/EAT/NSVT, normal LVEF  . Mitral regurgitation     Mild   History reviewed. No pertinent past surgical history. Family History  Problem Relation Age of Onset  . Heart failure Mother     Died in her 69s  . Tuberculosis Father     Died at young age  . Cancer Brother   . Arthritis     History  Substance Use Topics  . Smoking status: Never Smoker   . Smokeless tobacco: Never Used  . Alcohol Use: No   OB History   Grav Para Term Preterm Abortions TAB SAB Ect Mult Living                 Review of Systems  Constitutional: Negative for diaphoresis.  Cardiovascular: Negative for palpitations.   Neurological: Positive for weakness. Negative for dizziness.  All other systems reviewed and are negative.    Allergies  Review of patient's allergies indicates no known allergies.  Home Medications   Current Outpatient Rx  Name  Route  Sig  Dispense  Refill  . acetaminophen-codeine (TYLENOL #3) 300-30 MG per tablet   Oral   Take 1 tablet by mouth every 6 (six) hours.   60 tablet   5   . ALPRAZolam (XANAX) 0.25 MG tablet   Oral   Take 0.25 mg by mouth at bedtime as needed.           Marland Kitchen amLODipine (NORVASC) 10 MG tablet   Oral   Take 10 mg by mouth daily.           Marland Kitchen aspirin 81 MG tablet   Oral   Take 81 mg by mouth daily.           Marland Kitchen atorvastatin (LIPITOR) 40 MG tablet   Oral   Take 1 tablet (40 mg total) by mouth daily.   30 tablet   0     PLEASE SEND FUTURE REQUESTS FOR THIS MEDICATION TO .Marland Kitchen.   . Calcium Carbonate-Vitamin D (CALCIUM + D PO)   Oral   Take by mouth  daily.         . cetirizine (ZYRTEC) 10 MG tablet   Oral   Take 5 mg by mouth daily as needed.          . famotidine (PEPCID) 20 MG tablet   Oral   Take 20 mg by mouth daily.           . fish oil-omega-3 fatty acids 1000 MG capsule   Oral   Take 1 g by mouth daily.          . furosemide (LASIX) 20 MG tablet   Oral   Take 20 mg by mouth daily.           Marland Kitchen gabapentin (NEURONTIN) 100 MG capsule   Oral   Take 100 mg by mouth at bedtime.         Marland Kitchen glimepiride (AMARYL) 2 MG tablet   Oral   Take 2 mg by mouth daily before breakfast.         . levothyroxine (SYNTHROID, LEVOTHROID) 50 MCG tablet   Oral   Take 0.5 tablets by mouth Daily.          . metoprolol (LOPRESSOR) 50 MG tablet   Oral   Take 1 tablet (50 mg total) by mouth 2 (two) times daily.   60 tablet   6   . Multiple Vitamins-Minerals (MULTIVITAMIN WITH MINERALS) tablet   Oral   Take 1 tablet by mouth daily.           . potassium chloride SA (K-DUR,KLOR-CON) 20 MEQ tablet   Oral   Take 20 mEq by  mouth daily.           Marland Kitchen PROAIR HFA 108 (90 BASE) MCG/ACT inhaler   Inhalation   Inhale 1-2 puffs into the lungs as needed.          . quinapril (ACCUPRIL) 20 MG tablet   Oral   Take 1 tablet (20 mg total) by mouth daily.   30 tablet   6     Generic WUJ:WJXBJYNW   20MG  Generic GNF:AOZHYQMV   .Marland KitchenMarland Kitchen    Triage Vitals: BP 201/77  Pulse 112  Temp(Src) 98.2 F (36.8 C) (Oral)  Resp 18  Ht 5\' 6"  (1.676 m)  Wt 190 lb (86.183 kg)  BMI 30.68 kg/m2  SpO2 100%  Physical Exam  Nursing note and vitals reviewed. Constitutional: She is oriented to person, place, and time. She appears well-developed and well-nourished. No distress.  HENT:  Head: Normocephalic and atraumatic.  Eyes: Conjunctivae are normal.  Neck: Normal range of motion. Neck supple.  Cardiovascular: Normal rate and normal heart sounds.  An irregular rhythm present.  Pulmonary/Chest: Effort normal and breath sounds normal. No respiratory distress.  Abdominal: Soft. Bowel sounds are normal. She exhibits no distension.  Musculoskeletal: Normal range of motion. She exhibits no edema.  Neurological: She is alert and oriented to person, place, and time.  Skin: Skin is warm and dry.  Psychiatric: She has a normal mood and affect. Her behavior is normal.    ED Course  Procedures (including critical care time)  DIAGNOSTIC STUDIES: Oxygen Saturation is 100% on room air, normal by my interpretation.    COORDINATION OF CARE: 12:58 AM- Ordered CBG and discussed that a value of 225 is not cause for emergent concern at this time, but that the fluctuating levels are concerning. Discussed that the patient will need to be observed in the ED for a few hours. Will order an EKG, blood  labs (BMP, CBC), and UA. Discussed treatment plan with patient at bedside and patient verbalized agreement.     Labs Review Results for orders placed during the hospital encounter of 08/07/13  CBC WITH DIFFERENTIAL      Result Value Ref Range   WBC  7.1  4.0 - 10.5 K/uL   RBC 4.41  3.87 - 5.11 MIL/uL   Hemoglobin 12.6  12.0 - 15.0 g/dL   HCT 40.937.8  81.136.0 - 91.446.0 %   MCV 85.7  78.0 - 100.0 fL   MCH 28.6  26.0 - 34.0 pg   MCHC 33.3  30.0 - 36.0 g/dL   RDW 78.214.1  95.611.5 - 21.315.5 %   Platelets 268  150 - 400 K/uL   Neutrophils Relative % 80 (*) 43 - 77 %   Neutro Abs 5.7  1.7 - 7.7 K/uL   Lymphocytes Relative 15  12 - 46 %   Lymphs Abs 1.1  0.7 - 4.0 K/uL   Monocytes Relative 5  3 - 12 %   Monocytes Absolute 0.3  0.1 - 1.0 K/uL   Eosinophils Relative 0  0 - 5 %   Eosinophils Absolute 0.0  0.0 - 0.7 K/uL   Basophils Relative 0  0 - 1 %   Basophils Absolute 0.0  0.0 - 0.1 K/uL  BASIC METABOLIC PANEL      Result Value Ref Range   Sodium 132 (*) 137 - 147 mEq/L   Potassium 3.8  3.7 - 5.3 mEq/L   Chloride 96  96 - 112 mEq/L   CO2 22  19 - 32 mEq/L   Glucose, Bld 192 (*) 70 - 99 mg/dL   BUN 12  6 - 23 mg/dL   Creatinine, Ser 0.860.67  0.50 - 1.10 mg/dL   Calcium 9.3  8.4 - 57.810.5 mg/dL   GFR calc non Af Amer 79 (*) >90 mL/min   GFR calc Af Amer >90  >90 mL/min  URINALYSIS, ROUTINE W REFLEX MICROSCOPIC      Result Value Ref Range   Color, Urine YELLOW  YELLOW   APPearance CLEAR  CLEAR   Specific Gravity, Urine 1.020  1.005 - 1.030   pH 5.5  5.0 - 8.0   Glucose, UA NEGATIVE  NEGATIVE mg/dL   Hgb urine dipstick NEGATIVE  NEGATIVE   Bilirubin Urine NEGATIVE  NEGATIVE   Ketones, ur NEGATIVE  NEGATIVE mg/dL   Protein, ur NEGATIVE  NEGATIVE mg/dL   Urobilinogen, UA 0.2  0.0 - 1.0 mg/dL   Nitrite NEGATIVE  NEGATIVE   Leukocytes, UA TRACE (*) NEGATIVE  URINE MICROSCOPIC-ADD ON      Result Value Ref Range   Bacteria, UA FEW (*) RARE  CBG MONITORING, ED      Result Value Ref Range   Glucose-Capillary 225 (*) 70 - 99 mg/dL  CBG MONITORING, ED      Result Value Ref Range   Glucose-Capillary 107 (*) 70 - 99 mg/dL  CBG MONITORING, ED      Result Value Ref Range   Glucose-Capillary 94  70 - 99 mg/dL    EKG Interpretation   Date/Time:   Tuesday August 07 2013 01:17:41 EDT Ventricular Rate:  95 PR Interval:  148 QRS Duration: 126 QT Interval:  384 QTC Calculation: 482 R Axis:   27 Text Interpretation:  Sinus rhythm with Premature atrial complexes and  Premature ventricular complexes Left bundle branch block Abnormal ECG When  compared with ECG of 08-May-2010 08:08, Premature atrial complexes  and  Premature ventricular complexes are now Present Left bundle branch block  is now Present Confirmed by [facility]  MD, Grasiela Jonsson ([id number]) on 08/07/2013 2:17:57  AM      MDM   Final diagnoses:  Hypoglycemia    Hypoglycemia with rebound hyperglycemia to 2 large quantity of sugar that she consumed. Because hypoglycemia occurred while taking a sulfonylurea, she will need to be observed in the ED to make sure she does not have rebound hypoglycemia. Urinalysis will be obtained to rule out urinary tract infection.  She was observed in the ED for several hours and blood sugars have stabilized at around 100. Urinalysis is unremarkable. ECG was obtained because of an irregular rhythm noted on auscultation and this proved to be from PACs and PVCs. Patient was encouraged to make sure that she eats adequate amounts even if she's not feeling well, and to only use a moderate amount of sugar combined with complex carbohydrate if she has another episode of hypoglycemia.  I personally performed the services described in this documentation, which was scribed in my presence. The recorded information has been reviewed and is accurate.       Dione Booze, MD 08/07/13 [phone]

## 2013-08-10 ENCOUNTER — Other Ambulatory Visit: Payer: Self-pay | Admitting: Cardiology

## 2013-08-21 ENCOUNTER — Telehealth: Payer: Self-pay | Admitting: Cardiology

## 2013-08-21 NOTE — Telephone Encounter (Signed)
She just recently sat down to eat her breakfast and she is feeling her heart beating really fast. She has no other symptems, but really concerned about her heart rate.  She is going to lay down to try to see if that will help until someone can call from here

## 2013-08-21 NOTE — Telephone Encounter (Signed)
Spoke with patient and she stated that after she ate breakfast this morning she felt her heart racing a little bit. Patient informed nurse that she had coffee this morning with her breakfast. Nurse advised patient that the caffeine in coffee can make her have this feeling of her heart racing. Patient said she never knew that. Patient denies chest pain, sob or dizziness. Nurse advised patient to call office back with HR and BP.

## 2013-08-21 NOTE — Telephone Encounter (Signed)
Patient called back to give update on symptoms. Patient said earlier this morning when she took her BP, it was171/90. Patient said now her BP is131/84. Patient wasn't able to tell nurse what her HR was. Patient said she feels a lot better and doesn't feel her heart racing anymore. Nurse advised patient to limit caffeine products. Patient verbalized understanding.

## 2013-11-06 ENCOUNTER — Encounter: Payer: Self-pay | Admitting: Cardiology

## 2013-11-06 ENCOUNTER — Ambulatory Visit (INDEPENDENT_AMBULATORY_CARE_PROVIDER_SITE_OTHER): Payer: Medicare Other | Admitting: Cardiology

## 2013-11-06 VITALS — BP 169/72 | HR 74 | Ht 66.0 in | Wt 183.8 lb

## 2013-11-06 DIAGNOSIS — R002 Palpitations: Secondary | ICD-10-CM

## 2013-11-06 DIAGNOSIS — I10 Essential (primary) hypertension: Secondary | ICD-10-CM

## 2013-11-06 DIAGNOSIS — I447 Left bundle-branch block, unspecified: Secondary | ICD-10-CM

## 2013-11-06 NOTE — Progress Notes (Signed)
Clinical Summary [name] is an 80 y.o.female last seen in December 2014. Despite a lot of stress in the family due to illness in her husband and daughter, she has been doing relatively well. Reports no progressive palpitations, no syncope. She has been tending her summer garden.  Lab work from March of this year showed hemoglobin 12.6, platelets 268, potassium 3.8, BUN 12, creatinine 0.6. ECG from March showed sinus rhythm with left lower branch block (old) and PACs.  No Known Allergies  Current Outpatient Prescriptions  Medication Sig Dispense Refill  . ALPRAZolam (XANAX) 0.25 MG tablet Take 0.25 mg by mouth at bedtime as needed.        Marland Kitchen. amLODipine (NORVASC) 10 MG tablet Take 10 mg by mouth daily.        Marland Kitchen. aspirin 81 MG tablet Take 81 mg by mouth daily.        Marland Kitchen. atorvastatin (LIPITOR) 40 MG tablet Take 1 tablet (40 mg total) by mouth daily.  30 tablet  0  . Calcium Carbonate-Vitamin D (CALCIUM + D PO) Take by mouth daily.      . cetirizine (ZYRTEC) 10 MG tablet Take 5 mg by mouth daily as needed.       . docusate sodium (COLACE) 100 MG capsule Take 100 mg by mouth daily as needed for mild constipation.      . famotidine (PEPCID) 20 MG tablet Take 20 mg by mouth daily.        . ferrous sulfate 325 (65 FE) MG tablet Take 325 mg by mouth daily with breakfast.      . fish oil-omega-3 fatty acids 1000 MG capsule Take 1 g by mouth daily.       . furosemide (LASIX) 20 MG tablet Take 20 mg by mouth daily.        Marland Kitchen. gabapentin (NEURONTIN) 100 MG capsule Take 100 mg by mouth at bedtime.      Marland Kitchen. glimepiride (AMARYL) 2 MG tablet Take 2 mg by mouth daily before breakfast.      . levothyroxine (SYNTHROID, LEVOTHROID) 50 MCG tablet Take 0.5 tablets by mouth Daily.       . metoprolol (LOPRESSOR) 50 MG tablet TAKE 1 TABLET BY MOUTH TWICE DAILY  60 tablet  6  . Multiple Vitamins-Minerals (MULTIVITAMIN WITH MINERALS) tablet Take 1 tablet by mouth daily.        . potassium chloride SA (K-DUR,KLOR-CON)  20 MEQ tablet Take 20 mEq by mouth daily.        Marland Kitchen. PROAIR HFA 108 (90 BASE) MCG/ACT inhaler Inhale 1-2 puffs into the lungs as needed.       . quinapril (ACCUPRIL) 20 MG tablet TAKE 1 TABLET BY MOUTH every evening       No current facility-administered medications for this visit.    Past Medical History  Diagnosis Date  . Hypothyroidism   . Hypertension   . Type 2 diabetes mellitus   . Palpitations     PAT/EAT/NSVT, normal LVEF  . Mitral regurgitation     Mild    Social History Ms. Lanphier reports that she has never smoked. She has never used smokeless tobacco. [name] reports that she does not drink alcohol.  Review of Systems No chest pain, stable appetite, no bleeding problems. Other systems reviewed and negative.  Physical Examination Filed Vitals:   11/06/13 1010  BP: 169/72  Pulse: 74   Filed Weights   11/06/13 1010  Weight: 183 lb 12.8 oz (83.371 kg)  No acute distress.  HEENT: Conjunctiva and lids normal, oropharynx clear.  Neck: Supple, no elevated JVP.  Lungs: Clear auscultation, nonlabored.  Cardiac: Regular rate and rhythm, no S3.  Abdomen: Soft, nontender.  Extremities: Chronic stasis with edema/lymphedema..    Problem List and Plan   Palpitations No change in pattern or intensity on beta blocker therapy. Continue observation.  Left bundle branch block Old, followup ECG reviewed.  Essential hypertension, benign Blood pressure elevated today. No change made to current regimen. Keep follow up with Dr. Sudie BaileyKnowlton.    Jonelle SidleSamuel G. McDowell, M.D., F.A.C.C.

## 2013-11-06 NOTE — Assessment & Plan Note (Signed)
No change in pattern or intensity on beta blocker therapy. Continue observation.

## 2013-11-06 NOTE — Assessment & Plan Note (Signed)
Old, followup ECG reviewed.

## 2013-11-06 NOTE — Patient Instructions (Signed)

## 2013-11-06 NOTE — Assessment & Plan Note (Signed)
Blood pressure elevated today. No change made to current regimen. Keep follow up with Dr. Sudie BaileyKnowlton.

## 2014-01-03 ENCOUNTER — Encounter: Payer: Self-pay | Admitting: Gastroenterology

## 2014-02-06 ENCOUNTER — Ambulatory Visit: Payer: Medicare Other | Admitting: Gastroenterology

## 2014-03-05 ENCOUNTER — Other Ambulatory Visit: Payer: Self-pay | Admitting: Cardiology

## 2014-03-10 ENCOUNTER — Emergency Department (HOSPITAL_COMMUNITY)
Admission: EM | Admit: 2014-03-10 | Discharge: 2014-03-10 | Disposition: A | Discharge status: Home / Self Care | Payer: Medicare Other | Attending: Emergency Medicine | Admitting: Emergency Medicine

## 2014-03-10 ENCOUNTER — Emergency Department (HOSPITAL_COMMUNITY): Payer: Medicare Other

## 2014-03-10 ENCOUNTER — Encounter (HOSPITAL_COMMUNITY): Payer: Self-pay | Admitting: Emergency Medicine

## 2014-03-10 DIAGNOSIS — E119 Type 2 diabetes mellitus without complications: Secondary | ICD-10-CM | POA: Insufficient documentation

## 2014-03-10 DIAGNOSIS — Y929 Unspecified place or not applicable: Secondary | ICD-10-CM | POA: Diagnosis not present

## 2014-03-10 DIAGNOSIS — Z79899 Other long term (current) drug therapy: Secondary | ICD-10-CM | POA: Diagnosis not present

## 2014-03-10 DIAGNOSIS — Z7982 Long term (current) use of aspirin: Secondary | ICD-10-CM | POA: Diagnosis not present

## 2014-03-10 DIAGNOSIS — W1849XA Other slipping, tripping and stumbling without falling, initial encounter: Secondary | ICD-10-CM | POA: Diagnosis not present

## 2014-03-10 DIAGNOSIS — I10 Essential (primary) hypertension: Secondary | ICD-10-CM | POA: Insufficient documentation

## 2014-03-10 DIAGNOSIS — S2231XA Fracture of one rib, right side, initial encounter for closed fracture: Secondary | ICD-10-CM | POA: Insufficient documentation

## 2014-03-10 DIAGNOSIS — E039 Hypothyroidism, unspecified: Secondary | ICD-10-CM | POA: Diagnosis not present

## 2014-03-10 DIAGNOSIS — S299XXA Unspecified injury of thorax, initial encounter: Secondary | ICD-10-CM | POA: Diagnosis present

## 2014-03-10 DIAGNOSIS — Y9389 Activity, other specified: Secondary | ICD-10-CM | POA: Insufficient documentation

## 2014-03-10 MED ORDER — HYDROCODONE-ACETAMINOPHEN 5-325 MG PO TABS: 1.0000 | ORAL_TABLET | ORAL | Status: DC | PRN

## 2014-03-10 MED ORDER — HYDROCODONE-ACETAMINOPHEN 5-325 MG PO TABS
1.0000 | ORAL_TABLET | Freq: Once | ORAL | Status: AC
  Administered 2014-03-10: 1 via ORAL
  Filled 2014-03-10: qty 1

## 2014-03-10 NOTE — Discharge Instructions (Signed)
Cough, or take a long slow deep breaths several times every hour. Recheck with your primary care physician, or return to the emergency room with shortness of breath, fever, productive cough, or any other symptoms  Rib Fracture A rib fracture is a break or crack in one of the bones of the ribs. The ribs are a group of long, curved bones that wrap around your chest and attach to your spine. They protect your lungs and other organs in the chest cavity. A broken or cracked rib is often painful, but most do not cause other problems. Most rib fractures heal on their own over time. However, rib fractures can be more serious if multiple ribs are broken or if broken ribs move out of place and push against other structures. CAUSES   A direct blow to the chest. For example, this could happen during contact sports, a car accident, or a fall against a hard object.  Repetitive movements with high force, such as pitching a baseball or having severe coughing spells. SYMPTOMS   Pain when you breathe in or cough.  Pain when someone presses on the injured area. DIAGNOSIS  Your caregiver will perform a physical exam. Various imaging tests may be ordered to confirm the diagnosis and to look for related injuries. These tests may include a chest X-ray, computed tomography (CT), magnetic resonance imaging (MRI), or a bone scan. TREATMENT  Rib fractures usually heal on their own in 1-3 months. The longer healing period is often associated with a continued cough or other aggravating activities. During the healing period, pain control is very important. Medication is usually given to control pain. Hospitalization or surgery may be needed for more severe injuries, such as those in which multiple ribs are broken or the ribs have moved out of place.  HOME CARE INSTRUCTIONS   Avoid strenuous activity and any activities or movements that cause pain. Be careful during activities and avoid bumping the injured rib.  Gradually  increase activity as directed by your caregiver.  Only take over-the-counter or prescription medications as directed by your caregiver. Do not take other medications without asking your caregiver first.  Apply ice to the injured area for the first 1-2 days after you have been treated or as directed by your caregiver. Applying ice helps to reduce inflammation and pain.  Put ice in a plastic bag.  Place a towel between your skin and the bag.   Leave the ice on for 15-20 minutes at a time, every 2 hours while you are awake.  Perform deep breathing as directed by your caregiver. This will help prevent pneumonia, which is a common complication of a broken rib. Your caregiver may instruct you to:  Take deep breaths several times a day.  Try to cough several times a day, holding a pillow against the injured area.  Use a device called an incentive spirometer to practice deep breathing several times a day.  Drink enough fluids to keep your urine clear or pale yellow. This will help you avoid constipation.   Do not wear a rib belt or binder. These restrict breathing, which can lead to pneumonia.  SEEK IMMEDIATE MEDICAL CARE IF:   You have a fever.   You have difficulty breathing or shortness of breath.   You develop a continual cough, or you cough up thick or bloody sputum.  You feel sick to your stomach (nausea), throw up (vomit), or have abdominal pain.   You have worsening pain not controlled with medications.  MAKE SURE YOU:  Understand these instructions.  Will watch your condition.  Will get help right away if you are not doing well or get worse. Document Released: 05/10/2005 Document Revised: 01/10/2013 Document Reviewed: 07/12/2012 [facility] Patient Information 2015 [place], [place]. This information is not intended to replace advice given to you by your health care provider. Make sure you discuss any questions you have with your health care provider.

## 2014-03-10 NOTE — ED Provider Notes (Signed)
CSN: [id number]     Arrival date & time 03/10/14  1014 History  This chart was scribed for Rolland PorterMark Anirudh Baiz, MD by Gwenyth Oberatherine Macek, ED Scribe. This patient was seen in room APA05/[facility]05 and the patient's care was started at 10:37 AM.    Chief Complaint  Patient presents with  . Fall   The history is provided by the patient. No language interpreter was used.   HPI Comments: [name] is a 80 y.o. female with a history of HTN and DM who presents to the Emergency Department complaining of constant, right rib pain after she tripped over a rug in her bedroom earlier this morning. Pt states she fell onto her butt against commode. She denies hitting her head during the fall. Pt states pain is worse with walking and movement. Pt denies pain with breathing, back pain, hip pain, and leg pain. She does not currently take any pain medication.   Past Medical History  Diagnosis Date  . Hypothyroidism   . Hypertension   . Type 2 diabetes mellitus   . Palpitations     PAT/EAT/NSVT, normal LVEF  . Mitral regurgitation     Mild   History reviewed. No pertinent past surgical history. Family History  Problem Relation Age of Onset  . Heart failure Mother     Died in her 8070s  . Tuberculosis Father     Died at young age  . Cancer Brother   . Arthritis     History  Substance Use Topics  . Smoking status: Never Smoker   . Smokeless tobacco: Never Used  . Alcohol Use: No   OB History   Grav Para Term Preterm Abortions TAB SAB Ect Mult Living                 Review of Systems  Constitutional: Negative for fever, chills, diaphoresis, appetite change and fatigue.  HENT: Negative for mouth sores, sore throat and trouble swallowing.   Eyes: Negative for visual disturbance.  Respiratory: Negative for cough, chest tightness, shortness of breath and wheezing.   Cardiovascular: Negative for chest pain.  Gastrointestinal: Negative for nausea, vomiting, abdominal pain, diarrhea and abdominal distention.   Endocrine: Negative for polydipsia, polyphagia and polyuria.  Genitourinary: Negative for dysuria, frequency and hematuria.  Musculoskeletal: Negative for back pain and gait problem.       Right rib pain  Skin: Negative for color change, pallor and rash.  Neurological: Negative for dizziness, syncope, light-headedness and headaches.  Hematological: Does not bruise/bleed easily.  Psychiatric/Behavioral: Negative for behavioral problems and confusion.    Allergies  Chocolate  Home Medications   Prior to Admission medications   Medication Sig Start Date End Date Taking? Authorizing Provider  amLODipine (NORVASC) 10 MG tablet Take 10 mg by mouth daily.     Yes Historical Provider, MD  aspirin 81 MG tablet Take 81 mg by mouth daily.     Yes Historical Provider, MD  atorvastatin (LIPITOR) 40 MG tablet Take 40 mg by mouth daily at 6 PM. 09/27/11  Yes Jonelle SidleSamuel G McDowell, MD  famotidine (PEPCID) 20 MG tablet Take 20 mg by mouth daily as needed for heartburn.    Yes Historical Provider, MD  ferrous sulfate 325 (65 FE) MG tablet Take 325 mg by mouth daily with breakfast.   Yes Historical Provider, MD  fish oil-omega-3 fatty acids 1000 MG capsule Take 1 g by mouth daily.    Yes Historical Provider, MD  furosemide (LASIX) 20 MG tablet Take  20 mg by mouth daily.     Yes Historical Provider, MD  gabapentin (NEURONTIN) 100 MG capsule Take 100 mg by mouth at bedtime. 03/15/12  Yes Vickki Hearing, MD  glimepiride (AMARYL) 2 MG tablet Take 2 mg by mouth daily before breakfast.   Yes Historical Provider, MD  levothyroxine (SYNTHROID, LEVOTHROID) 50 MCG tablet Take 0.5 tablets by mouth Daily.  01/29/11  Yes Historical Provider, MD  metoprolol (LOPRESSOR) 50 MG tablet TAKE 1 TABLET BY MOUTH TWICE DAILY 03/05/14  Yes Jonelle Sidle, MD  Multiple Vitamins-Minerals (MULTIVITAMIN WITH MINERALS) tablet Take 1 tablet by mouth daily.     Yes Historical Provider, MD  potassium chloride SA (K-DUR,KLOR-CON) 20 MEQ  tablet Take 20 mEq by mouth daily.     Yes Historical Provider, MD  quinapril (ACCUPRIL) 20 MG tablet TAKE 1 TABLET BY MOUTH DAILY 03/05/14  Yes Jonelle Sidle, MD  ALPRAZolam Prudy Feeler) 0.25 MG tablet Take 0.25 mg by mouth at bedtime as needed.      Historical Provider, MD  cetirizine (ZYRTEC) 10 MG tablet Take 5 mg by mouth daily as needed.     Historical Provider, MD  docusate sodium (COLACE) 100 MG capsule Take 100 mg by mouth daily as needed for mild constipation.    Historical Provider, MD  HYDROcodone-acetaminophen (NORCO/VICODIN) 5-325 MG per tablet Take 1 tablet by mouth every 4 (four) hours as needed. 03/10/14   Rolland Porter, MD  PROAIR HFA 108 (90 BASE) MCG/ACT inhaler Inhale 1-2 puffs into the lungs every 4 (four) hours as needed for shortness of breath.  11/24/10   Historical Provider, MD   BP 174/60  Pulse 75  Temp(Src) 98 F (36.7 C) (Oral)  Resp 20  Ht 5\' 6"  (1.676 m)  Wt 176 lb (79.833 kg)  BMI 28.42 kg/m2  SpO2 100% Physical Exam  Nursing note and vitals reviewed. Constitutional: She is oriented to person, place, and time. She appears well-developed and well-nourished. No distress.  HENT:  Head: Normocephalic.  Eyes: Conjunctivae are normal. Pupils are equal, round, and reactive to light. No scleral icterus.  Neck: Normal range of motion. Neck supple. No thyromegaly present.  Cardiovascular: Normal rate and regular rhythm.  Exam reveals no gallop and no friction rub.   No murmur heard. Pulmonary/Chest: Effort normal and breath sounds normal. No respiratory distress. She has no wheezes. She has no rales.  No crepitus. Normal breath sounds.  Abdominal: Soft. Bowel sounds are normal. She exhibits no distension. There is no tenderness. There is no rebound.  Musculoskeletal: Normal range of motion.  Tenderness is right lateral ribs  Neurological: She is alert and oriented to person, place, and time.  Skin: Skin is warm and dry. No rash noted.  Psychiatric: She has a normal  mood and affect. Her behavior is normal.    ED Course  Procedures (including critical care time) DIAGNOSTIC STUDIES: Oxygen Saturation is 100% on RA, normal by my interpretation.    COORDINATION OF CARE: 10:37 AM Discussed treatment plan with pt at bedside and pt agreed to plan.   Labs Review Labs Reviewed - No data to display  Imaging Review Dg Ribs Unilateral W/chest Right  03/10/2014   CLINICAL DATA:  Fall with right lateral mid to lower rib pain.  EXAM: RIGHT RIBS AND CHEST - 3+ VIEW  COMPARISON:  11/16/2010  FINDINGS: Lungs are adequately inflated and clear. Cardiomediastinal silhouette is within normal. There is minimal calcified plaque over the thoracic aorta. There are degenerative changes  of the spine. There is a minimally displaced acute right lateral ninth rib fracture.  IMPRESSION: Minimally displaced acute right lateral ninth rib fracture.  No acute cardiopulmonary disease.   Electronically Signed   By: Elberta Fortisaniel  Boyle M.D.   On: 03/10/2014 11:44     EKG Interpretation None      MDM   Final diagnoses:  Rib fracture, right, closed, initial encounter    I discussed the rib fracture on x-ray with the patient and her daughters. She is feeling significant relief with the pain medicine. She's not overly sedate. Saturations 99%. I think is appropriate for outpatient treatment. I discussed coughing and deep breathing and pulmonary toilet with she and her daughters. Prescription for Vicodin. Return with fever, shortness of breath, productive cough, or other new symptoms.  I personally performed the services described in this documentation, which was scribed in my presence. The recorded information has been reviewed and is accurate.    Rolland PorterMark Xzaiver Vayda, MD 03/10/14 [phone]

## 2014-03-10 NOTE — ED Notes (Signed)
Pt tripped over rug in bedroom this morning and fell against bedside commode.  C/O pain to r ribs.

## 2014-03-10 NOTE — ED Notes (Signed)
Educated patient on incentive spirometry. RT notified.   Patient with no complaints at this time. Respirations even and unlabored. Skin warm/dry. Discharge instructions reviewed with patient at this time. Patient given opportunity to voice concerns/ask questions. Patient discharged at this time and left Emergency Department via wheelchair.

## 2014-04-08 ENCOUNTER — Encounter: Payer: Self-pay | Admitting: Gastroenterology

## 2014-04-16 ENCOUNTER — Encounter: Payer: Self-pay | Admitting: Gastroenterology

## 2014-04-16 ENCOUNTER — Ambulatory Visit (INDEPENDENT_AMBULATORY_CARE_PROVIDER_SITE_OTHER): Payer: Medicare Other | Admitting: Gastroenterology

## 2014-04-16 ENCOUNTER — Other Ambulatory Visit: Payer: Self-pay

## 2014-04-16 VITALS — BP 162/68 | HR 68 | Temp 97.0°F | Ht 66.0 in | Wt 175.0 lb

## 2014-04-16 DIAGNOSIS — R1314 Dysphagia, pharyngoesophageal phase: Secondary | ICD-10-CM | POA: Insufficient documentation

## 2014-04-16 DIAGNOSIS — R1310 Dysphagia, unspecified: Secondary | ICD-10-CM

## 2014-04-16 MED ORDER — OMEPRAZOLE 20 MG PO CPDR: 20.0000 mg | DELAYED_RELEASE_CAPSULE | Freq: Every day | ORAL | Status: DC

## 2014-04-16 NOTE — Patient Instructions (Addendum)
You have been scheduled for an upper endoscopy with dilation with Dr. Jena Gaussourk on 11/25.   Do not take Amaryl tomorrow morning. You may take your blood pressure medication with sips of water.   I have sent a medication called Prilosec to your pharmacy to start taking once each morning, 30 minutes before breakfast.

## 2014-04-16 NOTE — Progress Notes (Signed)
Primary Care Physician:  Milana ObeyKNOWLTON,[name], MD Primary Gastroenterologist:  Dr. Jena Gaussourk  Chief Complaint  Patient presents with  . Referral    HPI:   [name] is a very pleasant 80 year old female presenting as an urgent visit today from her PCP secondary to worsening dysphagia. Notes symptoms for at least a year but worsening recently. She notes several recent incidences of such severe choking she had to have the Heimlich maneuver by husband. Two of her daughters are present with her today.   She notes a globus sensation, solid food and liquid dysphagia, early satiety, a weight loss of 30 lbs in the past year. Now has to puree food. Decreased appetite. Feels sensation of "heaviness" after eating but denies abdominal pain. Has coughing attacks after eating. Chronic GERD noted. No PPI. On Pepcid daily. Frequent regurgitation. No melena, rectal bleeding. No prior EGD or colonoscopy.   Past Medical History  Diagnosis Date  . Hypothyroidism   . Hypertension   . Type 2 diabetes mellitus   . Palpitations     PAT/EAT/NSVT, normal LVEF  . Mitral regurgitation     Mild  . Anxiety   . Hyperlipidemia   . Depression     Past Surgical History  Procedure Laterality Date  . None      Current Outpatient Prescriptions  Medication Sig Dispense Refill  . ALPRAZolam (XANAX) 0.25 MG tablet Take 0.25 mg by mouth at bedtime as needed.      Marland Kitchen. amLODipine (NORVASC) 10 MG tablet Take 10 mg by mouth daily.      Marland Kitchen. aspirin 81 MG tablet Take 81 mg by mouth daily.      Marland Kitchen. atorvastatin (LIPITOR) 40 MG tablet Take 40 mg by mouth daily at 6 PM.    . cetirizine (ZYRTEC) 10 MG tablet Take 5 mg by mouth daily as needed.     . docusate sodium (COLACE) 100 MG capsule Take 100 mg by mouth daily as needed for mild constipation.    . famotidine (PEPCID) 20 MG tablet Take 20 mg by mouth daily as needed for heartburn.     . ferrous sulfate 325 (65 FE) MG tablet Take 325 mg by mouth daily with breakfast.    .  fish oil-omega-3 fatty acids 1000 MG capsule Take 1 g by mouth daily.     . furosemide (LASIX) 20 MG tablet Take 20 mg by mouth daily.      Marland Kitchen. gabapentin (NEURONTIN) 100 MG capsule Take 100 mg by mouth at bedtime.    Marland Kitchen. glimepiride (AMARYL) 2 MG tablet Take 2 mg by mouth daily before breakfast.    . levothyroxine (SYNTHROID, LEVOTHROID) 50 MCG tablet Take 0.5 tablets by mouth Daily.     . metoprolol (LOPRESSOR) 50 MG tablet TAKE 1 TABLET BY MOUTH TWICE DAILY 60 tablet 6  . potassium chloride SA (K-DUR,KLOR-CON) 20 MEQ tablet Take 20 mEq by mouth daily.      Marland Kitchen. PROAIR HFA 108 (90 BASE) MCG/ACT inhaler Inhale 1-2 puffs into the lungs every 4 (four) hours as needed for shortness of breath.     . quinapril (ACCUPRIL) 20 MG tablet TAKE 1 TABLET BY MOUTH DAILY 30 tablet 6  . omeprazole (PRILOSEC) 20 MG capsule Take 1 capsule (20 mg total) by mouth daily. 30 minutes before breakfast. 30 capsule 3   No current facility-administered medications for this visit.    Allergies as of 04/16/2014 - Review Complete 04/16/2014  Allergen Reaction Noted  . Chocolate  03/10/2014    Family History  Problem Relation Age of Onset  . Heart failure Mother     Died in her 2270s  . Tuberculosis Father     Died at young age  . Cancer Brother   . Arthritis    . Colon cancer      History   Social History  . Marital Status: Married    Spouse Name: N/A    Number of Children: N/A  . Years of Education: 11   Occupational History  . Retired   .     Social History Main Topics  . Smoking status: Never Smoker   . Smokeless tobacco: Never Used  . Alcohol Use: No  . Drug Use: No  . Sexual Activity: Not on file   Other Topics Concern  . Not on file   Social History Narrative    Review of Systems: Gen: see HPI CV: Denies chest pain, heart palpitations, peripheral edema, syncope.  Resp: +DOE GI: see HPI GU : Denies urinary burning, urinary frequency, urinary hesitancy MS: +joint pain/muscle aches Derm:  Denies rash, itching, dry skin Psych:+ anxiety Heme: Denies bruising, bleeding, and enlarged lymph nodes.  Physical Exam: BP 162/68 mmHg  Pulse 68  Temp(Src) 97 F (36.1 C) (Oral)  Ht 5\' 6"  (1.676 m)  Wt 175 lb (79.379 kg)  BMI 28.26 kg/m2 General:   Alert and oriented. Pleasant and cooperative. Well-nourished and well-developed.  Head:  Normocephalic and atraumatic. Eyes:  Without icterus, sclera clear and conjunctiva pink.  Ears:  Normal auditory acuity. Nose:  No deformity, discharge,  or lesions. Mouth:  No deformity or lesions, oral mucosa pink.  Lungs:  Clear to auscultation bilaterally. No wheezes, rales, or rhonchi. No distress.  Heart:  S1, S2 present  Abdomen:  +BS, soft, non-tender and non-distended. No HSM noted. No guarding or rebound. No masses appreciated.  Rectal:  Deferred  Msk:  Symmetrical without gross deformities. Normal posture. Extremities:  Bilateral lower extremity edema/lymphedema Neurologic:  Alert and  oriented x4;  grossly normal neurologically. Skin:  Intact without significant lesions or rashes. Psych:  Alert and cooperative. Normal mood and affect.

## 2014-04-16 NOTE — Assessment & Plan Note (Signed)
80 year old female with progressively worsening dysphagia requiring pureed intake but with worsening dysphagia, globus sensation, and associated weight loss due to decreased oral intake. No abdominal pain, melena, or odynophagia. Frequent GERD uncontrolled by Pepcid. Needs PPI daily and evaluation via upper endoscopy with dilation. Query uncontrolled GERD, web, ring, stricture, or possibly underlying motility disorder exacerbating symptoms. Unable to exclude malignancy.  Proceed with upper endoscopy and dilation in the near future with Dr. Jena Gaussourk. The risks, benefits, and alternatives have been discussed in detail with patient. They have stated understanding and desire to proceed.  Prilosec 20 mg once daily

## 2014-04-17 ENCOUNTER — Encounter (HOSPITAL_COMMUNITY)
Admission: RE | Disposition: A | Discharge status: Home / Self Care | Payer: Self-pay | Source: Ambulatory Visit | Attending: Internal Medicine

## 2014-04-17 ENCOUNTER — Ambulatory Visit (HOSPITAL_COMMUNITY)
Admission: RE | Admit: 2014-04-17 | Discharge: 2014-04-17 | Disposition: A | Discharge status: Home / Self Care | Payer: Medicare Other | Source: Ambulatory Visit | Attending: Internal Medicine | Admitting: Internal Medicine

## 2014-04-17 ENCOUNTER — Encounter (HOSPITAL_COMMUNITY): Payer: Self-pay | Admitting: *Deleted

## 2014-04-17 DIAGNOSIS — Z79899 Other long term (current) drug therapy: Secondary | ICD-10-CM | POA: Insufficient documentation

## 2014-04-17 DIAGNOSIS — K222 Esophageal obstruction: Secondary | ICD-10-CM | POA: Diagnosis not present

## 2014-04-17 DIAGNOSIS — E039 Hypothyroidism, unspecified: Secondary | ICD-10-CM | POA: Insufficient documentation

## 2014-04-17 DIAGNOSIS — R1310 Dysphagia, unspecified: Secondary | ICD-10-CM

## 2014-04-17 DIAGNOSIS — I10 Essential (primary) hypertension: Secondary | ICD-10-CM | POA: Diagnosis not present

## 2014-04-17 DIAGNOSIS — E785 Hyperlipidemia, unspecified: Secondary | ICD-10-CM | POA: Insufficient documentation

## 2014-04-17 DIAGNOSIS — F418 Other specified anxiety disorders: Secondary | ICD-10-CM | POA: Diagnosis not present

## 2014-04-17 DIAGNOSIS — K2950 Unspecified chronic gastritis without bleeding: Secondary | ICD-10-CM | POA: Diagnosis not present

## 2014-04-17 DIAGNOSIS — R6881 Early satiety: Secondary | ICD-10-CM | POA: Insufficient documentation

## 2014-04-17 DIAGNOSIS — K219 Gastro-esophageal reflux disease without esophagitis: Secondary | ICD-10-CM | POA: Insufficient documentation

## 2014-04-17 DIAGNOSIS — R1319 Other dysphagia: Secondary | ICD-10-CM | POA: Diagnosis present

## 2014-04-17 DIAGNOSIS — E119 Type 2 diabetes mellitus without complications: Secondary | ICD-10-CM | POA: Insufficient documentation

## 2014-04-17 HISTORY — PX: ESOPHAGOGASTRODUODENOSCOPY: SHX5428

## 2014-04-17 HISTORY — PX: MALONEY DILATION: SHX5535

## 2014-04-17 LAB — GLUCOSE, CAPILLARY: Glucose-Capillary: 121 mg/dL — ABNORMAL HIGH (ref 70–99)

## 2014-04-17 SURGERY — EGD (ESOPHAGOGASTRODUODENOSCOPY)
Anesthesia: Moderate Sedation

## 2014-04-17 MED ORDER — SODIUM CHLORIDE 0.9 % IV SOLN
INTRAVENOUS | Status: DC
  Administered 2014-04-17: 1000 mL via INTRAVENOUS

## 2014-04-17 MED ORDER — ONDANSETRON HCL 4 MG/2ML IJ SOLN
INTRAMUSCULAR | Status: AC
  Filled 2014-04-17: qty 2

## 2014-04-17 MED ORDER — MIDAZOLAM HCL 5 MG/5ML IJ SOLN
INTRAMUSCULAR | Status: AC
  Filled 2014-04-17: qty 10

## 2014-04-17 MED ORDER — ONDANSETRON HCL 4 MG/2ML IJ SOLN
INTRAMUSCULAR | Status: DC | PRN
  Administered 2014-04-17: 4 mg via INTRAVENOUS

## 2014-04-17 MED ORDER — LIDOCAINE VISCOUS 2 % MT SOLN
OROMUCOSAL | Status: AC
  Filled 2014-04-17: qty 15

## 2014-04-17 MED ORDER — MIDAZOLAM HCL 5 MG/5ML IJ SOLN
INTRAMUSCULAR | Status: DC | PRN
  Administered 2014-04-17: 1 mg via INTRAVENOUS
  Administered 2014-04-17: 2 mg via INTRAVENOUS

## 2014-04-17 MED ORDER — STERILE WATER FOR IRRIGATION IR SOLN
Status: DC | PRN
  Administered 2014-04-17: 11:00:00

## 2014-04-17 MED ORDER — LIDOCAINE VISCOUS 2 % MT SOLN
OROMUCOSAL | Status: DC | PRN
  Administered 2014-04-17: 4 mL via OROMUCOSAL

## 2014-04-17 MED ORDER — MEPERIDINE HCL 100 MG/ML IJ SOLN
INTRAMUSCULAR | Status: AC
  Filled 2014-04-17: qty 2

## 2014-04-17 MED ORDER — MEPERIDINE HCL 100 MG/ML IJ SOLN
INTRAMUSCULAR | Status: DC | PRN
  Administered 2014-04-17 (×2): 25 mg via INTRAVENOUS

## 2014-04-17 NOTE — Interval H&P Note (Signed)
History and Physical Interval Note:  04/17/2014 10:55 AM  [name]  has presented today for surgery, with the diagnosis of dysphagia  The various methods of treatment have been discussed with the patient and family. After consideration of risks, benefits and other options for treatment, the patient has consented to  Procedure(s) with comments: ESOPHAGOGASTRODUODENOSCOPY (EGD) (N/A) - 11:15am SAVORY DILATION (N/A) MALONEY DILATION (N/A) as a surgical intervention .  The patient's history has been reviewed, patient examined, no change in status, stable for surgery.  I have reviewed the patient's chart and labs.  Questions were answered to the patient's satisfaction.     Robert Rourk   No change. EGD with possible soft evaluation as appropriate.The risks, benefits, limitations, alternatives and imponderables have been reviewed with the patient. Potential for esophageal dilation, biopsy, etc. have also been reviewed.  Questions have been answered. All parties agreeable.

## 2014-04-17 NOTE — H&P (View-Only) (Signed)
  Primary Care Physician:  [name],[name], MD Primary Gastroenterologist:  Dr. Rourk  Chief Complaint  Patient presents with  . Referral    HPI:   [name] is a very pleasant 80-year-old female presenting as an urgent visit today from her PCP secondary to worsening dysphagia. Notes symptoms for at least a year but worsening recently. She notes several recent incidences of such severe choking she had to have the Heimlich maneuver by husband. Two of her daughters are present with her today.   She notes a globus sensation, solid food and liquid dysphagia, early satiety, a weight loss of 30 lbs in the past year. Now has to puree food. Decreased appetite. Feels sensation of "heaviness" after eating but denies abdominal pain. Has coughing attacks after eating. Chronic GERD noted. No PPI. On Pepcid daily. Frequent regurgitation. No melena, rectal bleeding. No prior EGD or colonoscopy.   Past Medical History  Diagnosis Date  . Hypothyroidism   . Hypertension   . Type 2 diabetes mellitus   . Palpitations     PAT/EAT/NSVT, normal LVEF  . Mitral regurgitation     Mild  . Anxiety   . Hyperlipidemia   . Depression     Past Surgical History  Procedure Laterality Date  . None      Current Outpatient Prescriptions  Medication Sig Dispense Refill  . ALPRAZolam (XANAX) 0.25 MG tablet Take 0.25 mg by mouth at bedtime as needed.      . amLODipine (NORVASC) 10 MG tablet Take 10 mg by mouth daily.      . aspirin 81 MG tablet Take 81 mg by mouth daily.      . atorvastatin (LIPITOR) 40 MG tablet Take 40 mg by mouth daily at 6 PM.    . cetirizine (ZYRTEC) 10 MG tablet Take 5 mg by mouth daily as needed.     . docusate sodium (COLACE) 100 MG capsule Take 100 mg by mouth daily as needed for mild constipation.    . famotidine (PEPCID) 20 MG tablet Take 20 mg by mouth daily as needed for heartburn.     . ferrous sulfate 325 (65 FE) MG tablet Take 325 mg by mouth daily with breakfast.    .  fish oil-omega-3 fatty acids 1000 MG capsule Take 1 g by mouth daily.     . furosemide (LASIX) 20 MG tablet Take 20 mg by mouth daily.      . gabapentin (NEURONTIN) 100 MG capsule Take 100 mg by mouth at bedtime.    . glimepiride (AMARYL) 2 MG tablet Take 2 mg by mouth daily before breakfast.    . levothyroxine (SYNTHROID, LEVOTHROID) 50 MCG tablet Take 0.5 tablets by mouth Daily.     . metoprolol (LOPRESSOR) 50 MG tablet TAKE 1 TABLET BY MOUTH TWICE DAILY 60 tablet 6  . potassium chloride SA (K-DUR,KLOR-CON) 20 MEQ tablet Take 20 mEq by mouth daily.      . PROAIR HFA 108 (90 BASE) MCG/ACT inhaler Inhale 1-2 puffs into the lungs every 4 (four) hours as needed for shortness of breath.     . quinapril (ACCUPRIL) 20 MG tablet TAKE 1 TABLET BY MOUTH DAILY 30 tablet 6  . omeprazole (PRILOSEC) 20 MG capsule Take 1 capsule (20 mg total) by mouth daily. 30 minutes before breakfast. 30 capsule 3   No current facility-administered medications for this visit.    Allergies as of 04/16/2014 - Review Complete 04/16/2014  Allergen Reaction Noted  . Chocolate    03/10/2014    Family History  Problem Relation Age of Onset  . Heart failure Mother     Died in her 2270s  . Tuberculosis Father     Died at young age  . Cancer Brother   . Arthritis    . Colon cancer      History   Social History  . Marital Status: Married    Spouse Name: N/A    Number of Children: N/A  . Years of Education: 11   Occupational History  . Retired   .     Social History Main Topics  . Smoking status: Never Smoker   . Smokeless tobacco: Never Used  . Alcohol Use: No  . Drug Use: No  . Sexual Activity: Not on file   Other Topics Concern  . Not on file   Social History Narrative    Review of Systems: Gen: see HPI CV: Denies chest pain, heart palpitations, peripheral edema, syncope.  Resp: +DOE GI: see HPI GU : Denies urinary burning, urinary frequency, urinary hesitancy MS: +joint pain/muscle aches Derm:  Denies rash, itching, dry skin Psych:+ anxiety Heme: Denies bruising, bleeding, and enlarged lymph nodes.  Physical Exam: BP 162/68 mmHg  Pulse 68  Temp(Src) 97 F (36.1 C) (Oral)  Ht 5\' 6"  (1.676 m)  Wt 175 lb (79.379 kg)  BMI 28.26 kg/m2 General:   Alert and oriented. Pleasant and cooperative. Well-nourished and well-developed.  Head:  Normocephalic and atraumatic. Eyes:  Without icterus, sclera clear and conjunctiva pink.  Ears:  Normal auditory acuity. Nose:  No deformity, discharge,  or lesions. Mouth:  No deformity or lesions, oral mucosa pink.  Lungs:  Clear to auscultation bilaterally. No wheezes, rales, or rhonchi. No distress.  Heart:  S1, S2 present  Abdomen:  +BS, soft, non-tender and non-distended. No HSM noted. No guarding or rebound. No masses appreciated.  Rectal:  Deferred  Msk:  Symmetrical without gross deformities. Normal posture. Extremities:  Bilateral lower extremity edema/lymphedema Neurologic:  Alert and  oriented x4;  grossly normal neurologically. Skin:  Intact without significant lesions or rashes. Psych:  Alert and cooperative. Normal mood and affect.

## 2014-04-17 NOTE — Discharge Instructions (Signed)
EGD Discharge instructions Please read the instructions outlined below and refer to this sheet in the next few weeks. These discharge instructions provide you with general information on caring for yourself after you leave the hospital. Your doctor may also give you specific instructions. While your treatment has been planned according to the most current medical practices available, unavoidable complications occasionally occur. If you have any problems or questions after discharge, please call your doctor. ACTIVITY  You may resume your regular activity but move at a slower pace for the next 24 hours.   Take frequent rest periods for the next 24 hours.   Walking will help expel (get rid of) the air and reduce the bloated feeling in your abdomen.   No driving for 24 hours (because of the anesthesia (medicine) used during the test).   You may shower.   Do not sign any important legal documents or operate any machinery for 24 hours (because of the anesthesia used during the test).  NUTRITION  Drink plenty of fluids.   You may resume your normal diet.   Begin with a light meal and progress to your normal diet.   Avoid alcoholic beverages for 24 hours or as instructed by your caregiver.  MEDICATIONS  You may resume your normal medications unless your caregiver tells you otherwise.  WHAT YOU CAN EXPECT TODAY  You may experience abdominal discomfort such as a feeling of fullness or gas pains.  FOLLOW-UP  Your doctor will discuss the results of your test with you.  SEEK IMMEDIATE MEDICAL ATTENTION IF ANY OF THE FOLLOWING OCCUR:  Excessive nausea (feeling sick to your stomach) and/or vomiting.   Severe abdominal pain and distention (swelling).   Trouble swallowing.   Temperature over 101 F (37.8 C).   Rectal bleeding or vomiting of blood.    Continue omeprazole 20 mg daily  Swallowing precautions reviewed with family members  Further recommendations to follow pending  review of pathology report  Office visit with us in 3 months

## 2014-04-17 NOTE — Op Note (Signed)
[facility] [address] [place], [id number]   ENDOSCOPY PROCEDURE REPORT  PATIENT: [name], [name]  MR#: [id number] BIRTHDATE: 1926/11/25 , 87  yrs. old GENDER: female ENDOSCOPIST: R.  Roetta SessionsMichael Rourk, MD FACP FACG REFERRED BY:  Gareth MorganSteve [name], [name].D. PROCEDURE DATE:  04/17/2014 PROCEDURE:  EGD w/ biopsy and Maloney dilation of esophagus INDICATIONS:  esophageal dysphagia. MEDICATIONS: Versed 3 mg IV and Demerol 50 mg IV in divided doses. Xylocaine gel orally.  Zofran 4 mg IV. ASA CLASS:      Class III  CONSENT: The risks, benefits, limitations, alternatives and imponderables have been discussed.  The potential for biopsy, esophogeal dilation, etc. have also been reviewed.  Questions have been answered.  All parties agreeable.  Please see the history and physical in the medical record for more information.  DESCRIPTION OF PROCEDURE: After the risks benefits and alternatives of the procedure were thoroughly explained, informed consent was obtained.  The EG-[id number] ([id number] endoscope was introduced through the mouth and advanced to the second portion of the duodenum , limited by Without limitations. The instrument was slowly withdrawn as the mucosa was fully examined.    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  Critical Schatzki's ring present; no Barrett's esophagus, tumor or esophagitis.  Initially, ring would not allow passage of the gastroscope.  However, with gentle pressure, the scope traversed the ring down into the stomach.  Gastric cavity empty.  4 cm hiatal hernia present.  Scattered antral erosions.  No ulcer or infiltrating process.  Patent pylorus.  Normal first and second portion of the duodenum. Subsequently, a 8052 French Maloney dilator was passed to full insertion easily.  Finally, 54 French Maloney dilator was passed to full insertion easily without resistance.  A look back revealed an apparent cervical web and Schatzki's ring had  been nicely dilated without apparent complication and minimal bleeding.  Biopsies of the abnormal antrum were taken just prior to conclusion of the procedure.  Retroflexed views revealed as previously described.     The scope was then withdrawn from the patient and the procedure completed.  COMPLICATIONS: There were no immediate complications.  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 Hiatal hernia. Antral erosions?"status post gastric biopsy.  RECOMMENDATIONS: Begin omeprazole 20 mg daily just prescribed. This medication should be taken every day?"indefinitely. Swallowing precautions were reviewed in detail with the patient's daughters. Further recommendations to follow pending review of pathology report.   REPEAT EXAM:  eSigned:  R. Roetta SessionsMichael Rourk, MD Jerrel IvoryFACP [facility] CenterFACG 04/17/2014 11:31 AM    CC:  CPT CODES: ICD CODES:  The ICD and CPT codes recommended by this software are interpretations from the data that the clinical staff has captured with the software.  The verification of the translation of this report to the ICD and CPT codes and modifiers is the sole responsibility of the health care institution and practicing physician where this report was generated.  [employer] will not be held responsible for the validity of the ICD and CPT codes included on this report.  AMA assumes no liability for data contained or not contained herein. CPT is a Publishing rights managerregistered trademark of the [employer].  PATIENT NAME:  [name], [name] MR#: [id number]

## 2014-04-17 NOTE — Progress Notes (Signed)
cc'ed to pcp °

## 2014-04-20 ENCOUNTER — Encounter: Payer: Self-pay | Admitting: Internal Medicine

## 2014-04-23 ENCOUNTER — Encounter (HOSPITAL_COMMUNITY): Payer: Self-pay | Admitting: Internal Medicine

## 2014-05-13 ENCOUNTER — Encounter: Payer: Self-pay | Admitting: Cardiology

## 2014-05-13 ENCOUNTER — Ambulatory Visit (INDEPENDENT_AMBULATORY_CARE_PROVIDER_SITE_OTHER): Payer: Medicare Other | Admitting: Cardiology

## 2014-05-13 VITALS — BP 157/68 | HR 69 | Ht 66.0 in | Wt 170.0 lb

## 2014-05-13 DIAGNOSIS — R002 Palpitations: Secondary | ICD-10-CM

## 2014-05-13 DIAGNOSIS — I340 Nonrheumatic mitral (valve) insufficiency: Secondary | ICD-10-CM

## 2014-05-13 DIAGNOSIS — I447 Left bundle-branch block, unspecified: Secondary | ICD-10-CM

## 2014-05-13 NOTE — Assessment & Plan Note (Signed)
Symptomatically well controlled on beta blocker therapy. Continue observation.

## 2014-05-13 NOTE — Assessment & Plan Note (Signed)
Chronic finding

## 2014-05-13 NOTE — Patient Instructions (Signed)

## 2014-05-13 NOTE — Assessment & Plan Note (Signed)
Mild by assessment over time, stable on examination.

## 2014-05-13 NOTE — Progress Notes (Signed)
Reason for visit: Follow-up palpitations  Clinical Summary [name] is an 80 y.o.female last seen in June. She is here with her daughter for a follow-up visit. Just recently she underwent GI evaluation secondary to dysphagia and is status post esophageal dilatation. She states that she has been able to eat better and is swallowing her pills more easily.  I reviewed her medications. She continues on Lopressor with good control of palpitations. Also Lasix and potassium supplements, her weight is down compared to prior assessment.  ECG from earlier this year reviewed, sinus rhythm with left bundle branch block.  Allergies  Allergen Reactions  . Chocolate     Hives    Current Outpatient Prescriptions  Medication Sig Dispense Refill  . ALPRAZolam (XANAX) 0.25 MG tablet Take 0.25 mg by mouth at bedtime as needed.      Marland Kitchen. amLODipine (NORVASC) 10 MG tablet Take 10 mg by mouth daily.      Marland Kitchen. aspirin 81 MG tablet Take 81 mg by mouth daily.      Marland Kitchen. atorvastatin (LIPITOR) 40 MG tablet Take 40 mg by mouth daily at 6 PM.    . cetirizine (ZYRTEC) 10 MG tablet Take 5 mg by mouth daily as needed.     . docusate sodium (COLACE) 100 MG capsule Take 100 mg by mouth daily as needed for mild constipation.    . famotidine (PEPCID) 20 MG tablet Take 20 mg by mouth daily as needed for heartburn.     . ferrous sulfate 325 (65 FE) MG tablet Take 325 mg by mouth daily with breakfast.    . fish oil-omega-3 fatty acids 1000 MG capsule Take 1 g by mouth daily.     . furosemide (LASIX) 20 MG tablet Take 20 mg by mouth daily.      Marland Kitchen. gabapentin (NEURONTIN) 100 MG capsule Take 100 mg by mouth at bedtime.    Marland Kitchen. glimepiride (AMARYL) 2 MG tablet Take 2 mg by mouth daily before breakfast.    . levothyroxine (SYNTHROID, LEVOTHROID) 50 MCG tablet Take 0.5 tablets by mouth Daily.     . metoprolol (LOPRESSOR) 50 MG tablet TAKE 1 TABLET BY MOUTH TWICE DAILY 60 tablet 6  . omeprazole (PRILOSEC) 20 MG capsule Take 1 capsule (20  mg total) by mouth daily. 30 minutes before breakfast. 30 capsule 3  . potassium chloride SA (K-DUR,KLOR-CON) 20 MEQ tablet Take 20 mEq by mouth daily.      Marland Kitchen. PROAIR HFA 108 (90 BASE) MCG/ACT inhaler Inhale 1-2 puffs into the lungs every 4 (four) hours as needed for shortness of breath.     . quinapril (ACCUPRIL) 20 MG tablet TAKE 1 TABLET BY MOUTH DAILY 30 tablet 6   No current facility-administered medications for this visit.    Past Medical History  Diagnosis Date  . Hypothyroidism   . Hypertension   . Type 2 diabetes mellitus   . Palpitations     PAT/EAT/NSVT, normal LVEF  . Mitral regurgitation     Mild  . Anxiety   . Hyperlipidemia   . Depression     Social History [name] reports that she has never smoked. She has never used smokeless tobacco. [name] reports that she does not drink alcohol.  Review of Systems Complete review of systems negative except as otherwise outlined in the clinical summary and also the following. No exertional chest pain, stable dyspnea on exertion.  Physical Examination Filed Vitals:   05/13/14 1052  BP: 157/68  Pulse: 69  Filed Weights   05/13/14 1052  Weight: 170 lb (77.111 kg)    No acute distress.  HEENT: Conjunctiva and lids normal, oropharynx clear.  Neck: Supple, no elevated JVP.  Lungs: Clear auscultation, nonlabored.  Cardiac: Regular rate and rhythm, no S3.  Abdomen: Soft, nontender.  Extremities: Chronic stasis with edema/lymphedema..    Problem List and Plan   Left bundle branch block Chronic finding.  Palpitations Symptomatically well controlled on beta blocker therapy. Continue observation.  Mitral regurgitation Mild by assessment over time, stable on examination.    Jonelle SidleSamuel G. McDowell, M.D., F.A.C.C.

## 2014-05-22 ENCOUNTER — Ambulatory Visit: Payer: Medicare Other | Admitting: Gastroenterology

## 2014-07-03 ENCOUNTER — Other Ambulatory Visit: Payer: Self-pay | Admitting: Gastroenterology

## 2014-07-13 ENCOUNTER — Encounter (HOSPITAL_COMMUNITY): Payer: Self-pay | Admitting: Emergency Medicine

## 2014-07-13 ENCOUNTER — Emergency Department (HOSPITAL_COMMUNITY): Payer: Medicare Other

## 2014-07-13 ENCOUNTER — Inpatient Hospital Stay (HOSPITAL_COMMUNITY)
Admission: EM | Admit: 2014-07-13 | Discharge: 2014-07-15 | DRG: 309 | Disposition: A | Discharge status: Home / Self Care | Payer: Medicare Other | Attending: Family Medicine | Admitting: Family Medicine

## 2014-07-13 ENCOUNTER — Inpatient Hospital Stay (HOSPITAL_COMMUNITY): Payer: Medicare Other

## 2014-07-13 DIAGNOSIS — I509 Heart failure, unspecified: Secondary | ICD-10-CM | POA: Diagnosis present

## 2014-07-13 DIAGNOSIS — K219 Gastro-esophageal reflux disease without esophagitis: Secondary | ICD-10-CM | POA: Diagnosis present

## 2014-07-13 DIAGNOSIS — J45909 Unspecified asthma, uncomplicated: Secondary | ICD-10-CM | POA: Diagnosis present

## 2014-07-13 DIAGNOSIS — E871 Hypo-osmolality and hyponatremia: Secondary | ICD-10-CM

## 2014-07-13 DIAGNOSIS — I10 Essential (primary) hypertension: Secondary | ICD-10-CM | POA: Diagnosis present

## 2014-07-13 DIAGNOSIS — R000 Tachycardia, unspecified: Secondary | ICD-10-CM | POA: Diagnosis present

## 2014-07-13 DIAGNOSIS — E785 Hyperlipidemia, unspecified: Secondary | ICD-10-CM | POA: Diagnosis present

## 2014-07-13 DIAGNOSIS — R778 Other specified abnormalities of plasma proteins: Secondary | ICD-10-CM

## 2014-07-13 DIAGNOSIS — I4891 Unspecified atrial fibrillation: Secondary | ICD-10-CM | POA: Diagnosis present

## 2014-07-13 DIAGNOSIS — E039 Hypothyroidism, unspecified: Secondary | ICD-10-CM

## 2014-07-13 DIAGNOSIS — I472 Ventricular tachycardia: Secondary | ICD-10-CM | POA: Diagnosis present

## 2014-07-13 DIAGNOSIS — I447 Left bundle-branch block, unspecified: Secondary | ICD-10-CM | POA: Diagnosis present

## 2014-07-13 DIAGNOSIS — Z7982 Long term (current) use of aspirin: Secondary | ICD-10-CM | POA: Diagnosis not present

## 2014-07-13 DIAGNOSIS — I4892 Unspecified atrial flutter: Secondary | ICD-10-CM | POA: Diagnosis present

## 2014-07-13 DIAGNOSIS — E119 Type 2 diabetes mellitus without complications: Secondary | ICD-10-CM

## 2014-07-13 DIAGNOSIS — I2510 Atherosclerotic heart disease of native coronary artery without angina pectoris: Secondary | ICD-10-CM | POA: Diagnosis present

## 2014-07-13 DIAGNOSIS — Z8249 Family history of ischemic heart disease and other diseases of the circulatory system: Secondary | ICD-10-CM

## 2014-07-13 DIAGNOSIS — Z23 Encounter for immunization: Secondary | ICD-10-CM | POA: Diagnosis not present

## 2014-07-13 DIAGNOSIS — Z800 Family history of malignant neoplasm of digestive organs: Secondary | ICD-10-CM

## 2014-07-13 DIAGNOSIS — R002 Palpitations: Secondary | ICD-10-CM

## 2014-07-13 DIAGNOSIS — E118 Type 2 diabetes mellitus with unspecified complications: Secondary | ICD-10-CM

## 2014-07-13 DIAGNOSIS — R7989 Other specified abnormal findings of blood chemistry: Secondary | ICD-10-CM

## 2014-07-13 LAB — CBC WITH DIFFERENTIAL/PLATELET
Basophils Absolute: 0.1 10*3/uL (ref 0.0–0.1)
Basophils Relative: 1 % (ref 0–1)
Eosinophils Absolute: 0.1 10*3/uL (ref 0.0–0.7)
Eosinophils Relative: 2 % (ref 0–5)
HCT: 37.2 % (ref 36.0–46.0)
Hemoglobin: 12.1 g/dL (ref 12.0–15.0)
LYMPHS ABS: 1.6 10*3/uL (ref 0.7–4.0)
Lymphocytes Relative: 28 % (ref 12–46)
MCH: 28.6 pg (ref 26.0–34.0)
MCHC: 32.5 g/dL (ref 30.0–36.0)
MCV: 87.9 fL (ref 78.0–100.0)
Monocytes Absolute: 0.3 10*3/uL (ref 0.1–1.0)
Monocytes Relative: 6 % (ref 3–12)
NEUTROS ABS: 3.7 10*3/uL (ref 1.7–7.7)
Neutrophils Relative %: 63 % (ref 43–77)
Platelets: 287 10*3/uL (ref 150–400)
RBC: 4.23 MIL/uL (ref 3.87–5.11)
RDW: 14.5 % (ref 11.5–15.5)
WBC: 5.8 10*3/uL (ref 4.0–10.5)

## 2014-07-13 LAB — APTT: aPTT: 32 seconds (ref 24–37)

## 2014-07-13 LAB — BASIC METABOLIC PANEL
Anion gap: 6 (ref 5–15)
BUN: 19 mg/dL (ref 6–23)
CO2: 21 mmol/L (ref 19–32)
Calcium: 9 mg/dL (ref 8.4–10.5)
Chloride: 107 mmol/L (ref 96–112)
Creatinine, Ser: 0.9 mg/dL (ref 0.50–1.10)
GFR calc Af Amer: 66 mL/min — ABNORMAL LOW (ref >90)
GFR, EST NON AFRICAN AMERICAN: 57 mL/min — AB (ref >90)
GLUCOSE: 151 mg/dL — AB (ref 70–99)
Potassium: 3.8 mmol/L (ref 3.5–5.1)
SODIUM: 134 mmol/L — AB (ref 135–145)

## 2014-07-13 LAB — D-DIMER, QUANTITATIVE: D-Dimer, Quant: 0.73 ug/mL-FEU — ABNORMAL HIGH (ref 0.00–0.48)

## 2014-07-13 LAB — TROPONIN I
TROPONIN I: 0.04 ng/mL — AB (ref <0.031)
Troponin I: 0.03 ng/mL (ref <0.031)

## 2014-07-13 LAB — CK TOTAL AND CKMB (NOT AT ARMC)
CK TOTAL: 85 U/L (ref 7–177)
CK, MB: 2.7 ng/mL (ref 0.3–4.0)
Relative Index: INVALID (ref 0.0–2.5)

## 2014-07-13 LAB — PROTIME-INR
INR: 1.1 (ref 0.00–1.49)
PROTHROMBIN TIME: 14.4 s (ref 11.6–15.2)

## 2014-07-13 MED ORDER — ASPIRIN 81 MG PO CHEW
81.0000 mg | CHEWABLE_TABLET | Freq: Every evening | ORAL | Status: DC
  Administered 2014-07-13 – 2014-07-14 (×2): 81 mg via ORAL
  Filled 2014-07-13 (×2): qty 1

## 2014-07-13 MED ORDER — INSULIN ASPART 100 UNIT/ML ~~LOC~~ SOLN
0.0000 [IU] | Freq: Every day | SUBCUTANEOUS | Status: DC
Start: 2014-07-13 — End: 2014-07-15

## 2014-07-13 MED ORDER — ALBUTEROL SULFATE (2.5 MG/3ML) 0.083% IN NEBU: 3.0000 mL | INHALATION_SOLUTION | RESPIRATORY_TRACT | Status: DC | PRN

## 2014-07-13 MED ORDER — DILTIAZEM HCL 100 MG IV SOLR
5.0000 mg/h | INTRAVENOUS | Status: DC
  Administered 2014-07-13: 5 mg/h via INTRAVENOUS
  Administered 2014-07-14: 15 mg/h via INTRAVENOUS
  Filled 2014-07-13: qty 100

## 2014-07-13 MED ORDER — LEVOTHYROXINE SODIUM 25 MCG PO TABS
25.0000 ug | ORAL_TABLET | Freq: Every day | ORAL | Status: DC
  Administered 2014-07-14 – 2014-07-15 (×2): 25 ug via ORAL
  Filled 2014-07-13 (×2): qty 1

## 2014-07-13 MED ORDER — METOPROLOL TARTRATE 1 MG/ML IV SOLN
5.0000 mg | Freq: Once | INTRAVENOUS | Status: AC
  Administered 2014-07-13: 5 mg via INTRAVENOUS
  Filled 2014-07-13: qty 5

## 2014-07-13 MED ORDER — LISINOPRIL 10 MG PO TABS
20.0000 mg | ORAL_TABLET | Freq: Every day | ORAL | Status: DC
  Administered 2014-07-14: 20 mg via ORAL
  Filled 2014-07-13: qty 2

## 2014-07-13 MED ORDER — METOPROLOL TARTRATE 25 MG PO TABS
25.0000 mg | ORAL_TABLET | Freq: Once | ORAL | Status: DC
  Filled 2014-07-13: qty 1

## 2014-07-13 MED ORDER — LORATADINE 10 MG PO TABS
10.0000 mg | ORAL_TABLET | Freq: Every day | ORAL | Status: DC
  Administered 2014-07-14 – 2014-07-15 (×2): 10 mg via ORAL
  Filled 2014-07-13 (×2): qty 1

## 2014-07-13 MED ORDER — OMEGA-3-ACID ETHYL ESTERS 1 G PO CAPS
1.0000 g | ORAL_CAPSULE | ORAL | Status: DC
  Administered 2014-07-15: 1 g via ORAL
  Filled 2014-07-13: qty 1

## 2014-07-13 MED ORDER — AMLODIPINE BESYLATE 5 MG PO TABS
10.0000 mg | ORAL_TABLET | Freq: Every day | ORAL | Status: DC
  Administered 2014-07-14 – 2014-07-15 (×2): 10 mg via ORAL
  Filled 2014-07-13 (×2): qty 2

## 2014-07-13 MED ORDER — ENOXAPARIN SODIUM 80 MG/0.8ML ~~LOC~~ SOLN
80.0000 mg | Freq: Two times a day (BID) | SUBCUTANEOUS | Status: DC
  Administered 2014-07-13 – 2014-07-14 (×2): 80 mg via SUBCUTANEOUS
  Filled 2014-07-13 (×2): qty 0.8

## 2014-07-13 MED ORDER — SODIUM CHLORIDE 0.9 % IV BOLUS (SEPSIS)
500.0000 mL | Freq: Once | INTRAVENOUS | Status: AC
  Administered 2014-07-13: 500 mL via INTRAVENOUS

## 2014-07-13 MED ORDER — INSULIN ASPART 100 UNIT/ML ~~LOC~~ SOLN
0.0000 [IU] | Freq: Three times a day (TID) | SUBCUTANEOUS | Status: DC
Start: 2014-07-14 — End: 2014-07-15
  Administered 2014-07-14 (×2): 1 [IU] via SUBCUTANEOUS

## 2014-07-13 MED ORDER — DOCUSATE SODIUM 100 MG PO CAPS: 100.0000 mg | ORAL_CAPSULE | Freq: Every evening | ORAL | Status: DC | PRN

## 2014-07-13 MED ORDER — SODIUM CHLORIDE 0.9 % IV SOLN: INTRAVENOUS | Status: AC

## 2014-07-13 MED ORDER — ALPRAZOLAM 0.25 MG PO TABS
0.2500 mg | ORAL_TABLET | Freq: Every evening | ORAL | Status: DC | PRN
  Administered 2014-07-14: 0.25 mg via ORAL
  Filled 2014-07-13: qty 1

## 2014-07-13 MED ORDER — PANTOPRAZOLE SODIUM 40 MG PO TBEC
40.0000 mg | DELAYED_RELEASE_TABLET | Freq: Every day | ORAL | Status: DC
  Administered 2014-07-14 – 2014-07-15 (×2): 40 mg via ORAL
  Filled 2014-07-13 (×2): qty 1

## 2014-07-13 MED ORDER — QUINAPRIL HCL 10 MG PO TABS: 20.0000 mg | ORAL_TABLET | Freq: Every day | ORAL | Status: DC

## 2014-07-13 MED ORDER — IOHEXOL 350 MG/ML SOLN
100.0000 mL | Freq: Once | INTRAVENOUS | Status: AC | PRN
  Administered 2014-07-13: 100 mL via INTRAVENOUS

## 2014-07-13 MED ORDER — GLIMEPIRIDE 2 MG PO TABS
1.0000 mg | ORAL_TABLET | Freq: Every day | ORAL | Status: DC
  Administered 2014-07-14 – 2014-07-15 (×2): 1 mg via ORAL
  Filled 2014-07-13 (×2): qty 1

## 2014-07-13 MED ORDER — ATORVASTATIN CALCIUM 40 MG PO TABS
40.0000 mg | ORAL_TABLET | Freq: Every day | ORAL | Status: DC
  Administered 2014-07-13 – 2014-07-14 (×2): 40 mg via ORAL
  Filled 2014-07-13 (×2): qty 1

## 2014-07-13 MED ORDER — ACETAMINOPHEN 325 MG PO TABS: 650.0000 mg | ORAL_TABLET | Freq: Four times a day (QID) | ORAL | Status: DC | PRN

## 2014-07-13 MED ORDER — INFLUENZA VAC SPLIT QUAD 0.5 ML IM SUSY
0.5000 mL | PREFILLED_SYRINGE | INTRAMUSCULAR | Status: DC
  Filled 2014-07-13: qty 0.5

## 2014-07-13 MED ORDER — SODIUM CHLORIDE 0.9 % IJ SOLN
3.0000 mL | Freq: Two times a day (BID) | INTRAMUSCULAR | Status: DC
  Administered 2014-07-14 – 2014-07-15 (×2): 3 mL via INTRAVENOUS

## 2014-07-13 MED ORDER — ACETAMINOPHEN 650 MG RE SUPP: 650.0000 mg | Freq: Four times a day (QID) | RECTAL | Status: DC | PRN

## 2014-07-13 MED ORDER — PNEUMOCOCCAL VAC POLYVALENT 25 MCG/0.5ML IJ INJ
0.5000 mL | INJECTION | INTRAMUSCULAR | Status: DC
  Filled 2014-07-13: qty 0.5

## 2014-07-13 NOTE — ED Provider Notes (Signed)
CSN: [id number]     Arrival date & time 07/13/14  1359 History  This chart was scribed for Gilda Creasehristopher J. Pollina, * by Modena JanskyAlbert Thayil, ED Scribe. This patient was seen in room APA12/APA12 and the patient's care was started at 2:36 PM.   Chief Complaint  Patient presents with  . Tachycardia   The history is provided by the patient. No language interpreter was used.   HPI Comments: [name] is a 81 y.o. female who presents to the Emergency Department complaining of tachycardia that started today. She reports that she went to get her medication refilled and got her blood sugar and heart rate checked today. She states that she was told to come to the ED when her heart rate was 140. Pt's pulse in the ED today is 142. Family member states that pt has prior hx of irregular heartbeat. She denies any chest pain or SOB.   Past Medical History  Diagnosis Date  . Hypothyroidism   . Hypertension   . Type 2 diabetes mellitus   . Palpitations     PAT/EAT/NSVT, normal LVEF  . Mitral regurgitation     Mild  . Anxiety   . Hyperlipidemia   . Depression    Past Surgical History  Procedure Laterality Date  . None    . 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 hiatal hernia. Antral erosions status post gastric biopsy.  [name]. Maloney dilation N/A 04/17/2014    Procedure: Elease HashimotoMALONEY DILATION;  Surgeon: Corbin Adeobert M Rourk, MD;  Location: [facility] END[facility];  Service: Endoscopy;  Laterality: N/A;   Family History  Problem Relation Age of Onset  . Heart failure Mother     Died in her 6370s  . Tuberculosis Father     Died at young age  . Cancer Brother   . Arthritis    . Colon cancer     History  Substance Use Topics  . Smoking status: Never Smoker   . Smokeless tobacco: Never Used  . Alcohol Use: No   OB History    Gravida Para Term Preterm AB TAB SAB Ectopic Multiple Living   5 5 4 1      5       Review of Systems  Respiratory: Negative for shortness of breath.   Cardiovascular: Negative for chest pain.  All other systems reviewed and are negative.   Allergies  Chocolate  Home Medications   Prior to Admission medications   Medication Sig Start Date End Date Taking? Authorizing Provider  ALPRAZolam Prudy Feeler(XANAX) 0.25 MG tablet Take 0.25 mg by mouth at bedtime as needed for anxiety or sleep.    Yes Historical Provider, MD  amLODipine (NORVASC) 10 MG tablet Take 10 mg by mouth daily.     Yes Historical Provider, MD  aspirin 81 MG tablet Take 81 mg by mouth every evening.    Yes Historical Provider, MD  atorvastatin (LIPITOR) 40 MG tablet Take 40 mg by mouth daily at 6 PM. 09/27/11  Yes Jonelle SidleSamuel G McDowell, MD  cetirizine (ZYRTEC) 10 MG tablet Take 5-10 mg by mouth daily as needed for allergies or rhinitis.    Yes Historical Provider, MD  docusate sodium (COLACE) 100 MG capsule Take 100 mg by mouth at bedtime as needed for mild constipation.    Yes Historical Provider, MD  ferrous sulfate 325 (65 FE) MG tablet Take 325 mg by mouth daily with breakfast.  Yes Historical Provider, MD  fish oil-omega-3 fatty acids 1000 MG capsule Take 1 g by mouth 2 (two) times a week.    Yes Historical Provider, MD  furosemide (LASIX) 20 MG tablet Take 20 mg by mouth daily as needed for fluid.   Yes Historical Provider, MD  glimepiride (AMARYL) 2 MG tablet Take 1 mg by mouth daily before breakfast.    Yes Historical Provider, MD  levothyroxine (SYNTHROID, LEVOTHROID) 50 MCG tablet Take 0.5 tablets by mouth Daily.  01/29/11  Yes Historical Provider, MD  metoprolol (LOPRESSOR) 50 MG tablet TAKE 1 TABLET BY MOUTH TWICE DAILY 03/05/14  Yes Jonelle Sidle, MD  omeprazole (PRILOSEC) 20 MG capsule TAKE 1 CAPSULE BY MOUTH DAILY 30 MINUTES BEFORE BREAKFAST 07/03/14  Yes Nira Retort, NP  potassium chloride SA (K-DUR,KLOR-CON) 20 MEQ tablet Take 20 mEq by mouth daily as needed (Only takes if Lasix (Furosemide)  is  taken).    Yes Historical Provider, MD  PROAIR HFA 108 (90 BASE) MCG/ACT inhaler Inhale 1-2 puffs into the lungs every 4 (four) hours as needed for shortness of breath.  11/24/10  Yes Historical Provider, MD  quinapril (ACCUPRIL) 20 MG tablet TAKE 1 TABLET BY MOUTH DAILY 03/05/14  Yes Jonelle Sidle, MD   BP 140/76 mmHg  Pulse 142  Temp(Src) 98.8 F (37.1 C) (Oral)  Resp 18  SpO2 99% Physical Exam  Constitutional: She is oriented to person, place, and time. She appears well-developed and well-nourished. No distress.  HENT:  Head: Normocephalic and atraumatic.  Right Ear: Hearing normal.  Left Ear: Hearing normal.  Nose: Nose normal.  Mouth/Throat: Oropharynx is clear and moist and mucous membranes are normal.  Eyes: Conjunctivae and EOM are normal. Pupils are equal, round, and reactive to light.  Neck: Normal range of motion. Neck supple.  Cardiovascular: Regular rhythm, S1 normal and S2 normal.  Tachycardia present.  Exam reveals no gallop and no friction rub.   No murmur heard. Pulmonary/Chest: Effort normal and breath sounds normal. No respiratory distress. She exhibits no tenderness.  Abdominal: Soft. Normal appearance and bowel sounds are normal. There is no hepatosplenomegaly. There is no tenderness. There is no rebound, no guarding, no tenderness at McBurney's point and negative Murphy's sign. No hernia.  Musculoskeletal: Normal range of motion.  Neurological: She is alert and oriented to person, place, and time. She has normal strength. No cranial nerve deficit or sensory deficit. Coordination normal. GCS eye subscore is 4. GCS verbal subscore is 5. GCS motor subscore is 6.  Skin: Skin is warm, dry and intact. No rash noted. No cyanosis.  Psychiatric: She has a normal mood and affect. Her speech is normal and behavior is normal. Thought content normal.  Nursing note and vitals reviewed.   ED Course  Procedures (including critical care time) DIAGNOSTIC STUDIES: Oxygen  Saturation is 99% on RA, normal by my interpretation.    COORDINATION OF CARE: 2:40 PM- Pt advised of plan for treatment and pt agrees.  Labs Review Labs Reviewed  BASIC METABOLIC PANEL - Abnormal; Notable for the following:    Sodium 134 (*)    Glucose, Bld 151 (*)    GFR calc non Af Amer 57 (*)    GFR calc Af Amer 66 (*)    All other components within normal limits  TROPONIN I - Abnormal; Notable for the following:    Troponin I 0.04 (*)    All other components within normal limits  CBC WITH DIFFERENTIAL/PLATELET  Imaging Review Dg Chest Port 1 View  07/13/2014   CLINICAL DATA:  Mesh evaluation for heart palpitations, tachycardia  EXAM: PORTABLE CHEST - 1 VIEW  COMPARISON:  03/10/2014  FINDINGS: Heart size upper normal to mildly enlarged. Normal vascular pattern. Lungs clear.  IMPRESSION: No active disease.   Electronically Signed   By: Esperanza Heir M.D.   On: 07/13/2014 15:29     EKG Interpretation   Date/Time:  Saturday July 13 2014 14:12:38 EST Ventricular Rate:  142 PR Interval:  128 QRS Duration: 124 QT Interval:  332 QTC Calculation: 510 R Axis:   29 Text Interpretation:  Sinus tachycardia vs AFlutter Left bundle branch  block Abnormal ECG Confirmed by POLLINA  MD, CHRISTOPHER [phone]) on  07/13/2014 5:56:30 PM      MDM   Final diagnoses:  Palpitations  Atrial flutter, unspecified   Patient presents to the ER for evaluation of elevated heart rate. Patient reports that she had some dizziness earlier today, checked her blood pressure. Blood pressure was normal, but her heart rate was 140. Upon arrival to the ER, patient did have heart rate around 140. It is a wide complex regular tachycardia. Patient does, however, have a baseline left bundle branch block. Morphology was suspicious for atrial flutter. Patient is treated by cardiology for a history of palpitations. She looks like she has a diagnosis of paroxysmal atrial tachycardia in the past. She is on  Lopressor. Patient was given additional Lopressor. Heart rate did come down into the 100 to 110 range briefly. She appeared to be a variable block at that point. Heart rate to come back up into the 140 range after Lopressor were off, however.  Discussed with cardiology. Recommendation for additional Lopressor, both IV and by mouth for rate control. Anticoagulation was also recommended. Patient to be admitted to the hospital here for further monitoring and evaluation.  I personally performed the services described in this documentation, which was scribed in my presence. The recorded information has been reviewed and is accurate.      Gilda Crease, MD 07/13/14 1910

## 2014-07-13 NOTE — ED Notes (Signed)
Patient went to get medications refilled at pharmacy and pharmacist checked blood sugar and heart rate. Patient told to come to ER after told heart rate was 140. Denies any chest pain or shortness of breath.

## 2014-07-13 NOTE — H&P (Addendum)
[name] is an 81 y.o. female.    Dr. Karie Kirks (pcp) Dr. Domenic Polite (cardiologist) Dr. Verlene Mayer ? (GI)  Chief Complaint: palpitations HPI: 81 yo female with htn, hyperlipidemia, dm2, hypothyroidism, MR, had bp check at pharmacy and heart rate was noted to be high.  Pt was asymptomatic, other than slight sob this am.  Denies fever, chills, cp, palp, n/v, diarrhea, brbpr, black stool.  Pt was brought to ED by her family and was noted to be tachycardic.  ED consulted cardiology and spoke with them and deduced that she was in atrial flutter at 140.  Pt was treated with lopressor 58m po x1, and 564miv x3.  Hr improved slightly but remains high at this time.  Her trop was mildly elevated at 0.04.  Per ED cardiology [facility] will be admitted for Tachycardia, ? Aflutter.   Past Medical History  Diagnosis Date  . Hypothyroidism   . Hypertension   . Type 2 diabetes mellitus   . Palpitations     PAT/EAT/NSVT, normal LVEF  . Mitral regurgitation     Mild  . Anxiety   . Hyperlipidemia   . Depression     Past Surgical History  Procedure Laterality Date  . None    . 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 hiatal hernia. Antral erosions status post gastric biopsy.  . Venia Minksilation N/A 04/17/2014    Procedure: MAVenia MinksILATION;  Surgeon: RoDaneil DolinMD;  Location: [facility];  Service: Endoscopy;  Laterality: N/A;    Family History  Problem Relation Age of Onset  . Heart failure Mother     Died in her 7058s. Tuberculosis Father     Died at young age  . Cancer Brother   . Arthritis    . Colon cancer     Social History:  reports that she has never smoked. She has never used smokeless tobacco. She reports that she does not drink alcohol or use illicit drugs.  Allergies:  Allergies  Allergen Reactions  . Chocolate      Hives   Medications reviewed  (Not in a hospital admission)  Results for orders placed or performed during the hospital encounter of 07/13/14 (from the past 48 hour(s))  CBC with Differential/Platelet     Status: None   Collection Time: 07/13/14  2:40 PM  Result Value Ref Range   WBC 5.8 4.0 - 10.5 K/uL   RBC 4.23 3.87 - 5.11 MIL/uL   Hemoglobin 12.1 12.0 - 15.0 g/dL   HCT 37.2 36.0 - 46.0 %   MCV 87.9 78.0 - 100.0 fL   MCH 28.6 26.0 - 34.0 pg   MCHC 32.5 30.0 - 36.0 g/dL   RDW 14.5 11.5 - 15.5 %   Platelets 287 150 - 400 K/uL   Neutrophils Relative % 63 43 - 77 %   Neutro Abs 3.7 1.7 - 7.7 K/uL   Lymphocytes Relative 28 12 - 46 %   Lymphs Abs 1.6 0.7 - 4.0 K/uL   Monocytes Relative 6 3 - 12 %   Monocytes Absolute 0.3 0.1 - 1.0 K/uL   Eosinophils Relative 2 0 - 5 %   Eosinophils Absolute 0.1 0.0 - 0.7 K/uL   Basophils Relative 1 0 - 1 %   Basophils Absolute 0.1 0.0 - 0.1 K/uL  Basic metabolic panel  Status: Abnormal   Collection Time: 07/13/14  2:40 PM  Result Value Ref Range   Sodium 134 (L) 135 - 145 mmol/L   Potassium 3.8 3.5 - 5.1 mmol/L   Chloride 107 96 - 112 mmol/L   CO2 21 19 - 32 mmol/L   Glucose, Bld 151 (H) 70 - 99 mg/dL   BUN 19 6 - 23 mg/dL   Creatinine, Ser 0.90 0.50 - 1.10 mg/dL   Calcium 9.0 8.4 - 10.5 mg/dL   GFR calc non Af Amer 57 (L) >90 mL/min   GFR calc Af Amer 66 (L) >90 mL/min    Comment: (NOTE) The eGFR has been calculated using the CKD EPI equation. This calculation has not been validated in all clinical situations. eGFR's persistently <90 mL/min signify possible Chronic Kidney Disease.    Anion gap 6 5 - 15  Troponin I     Status: Abnormal   Collection Time: 07/13/14  2:40 PM  Result Value Ref Range   Troponin I 0.04 (H) <0.031 ng/mL    Comment:        PERSISTENTLY INCREASED TROPONIN VALUES IN THE RANGE OF 0.04-0.49 ng/mL CAN BE SEEN IN:       -UNSTABLE ANGINA       -CONGESTIVE HEART FAILURE       -MYOCARDITIS       -CHEST  TRAUMA       -ARRYHTHMIAS       -LATE PRESENTING MYOCARDIAL INFARCTION       -COPD   CLINICAL FOLLOW-UP RECOMMENDED.    Dg Chest Port 1 View  07/13/2014   CLINICAL DATA:  Mesh evaluation for heart palpitations, tachycardia  EXAM: PORTABLE CHEST - 1 VIEW  COMPARISON:  03/10/2014  FINDINGS: Heart size upper normal to mildly enlarged. Normal vascular pattern. Lungs clear.  IMPRESSION: No active disease.   Electronically Signed   By: Skipper Cliche M.D.   On: 07/13/2014 15:29    Review of Systems  Constitutional: Negative for fever, chills, weight loss, malaise/fatigue and diaphoresis.  HENT: Negative for congestion, ear discharge, ear pain, hearing loss, nosebleeds, sore throat and tinnitus.   Eyes: Negative for blurred vision, double vision, photophobia, pain, discharge and redness.  Respiratory: Negative for cough, hemoptysis, sputum production, shortness of breath, wheezing and stridor.   Cardiovascular: Positive for palpitations. Negative for chest pain, orthopnea, claudication, leg swelling and PND.  Gastrointestinal: Negative for heartburn, nausea, vomiting, abdominal pain, diarrhea, constipation, blood in stool and melena.  Genitourinary: Negative for dysuria, urgency, frequency, hematuria and flank pain.  Musculoskeletal: Negative for myalgias, back pain, joint pain, falls and neck pain.  Skin: Negative for itching and rash.  Neurological: Negative for dizziness, tingling, tremors, sensory change, speech change, focal weakness, seizures, loss of consciousness, weakness and headaches.  Endo/Heme/Allergies: Negative for environmental allergies and polydipsia. Does not bruise/bleed easily.  Psychiatric/Behavioral: Negative for depression, suicidal ideas, hallucinations, memory loss and substance abuse. The patient is not nervous/anxious and does not have insomnia.     Blood pressure 146/93, pulse 141, temperature 98.8 F (37.1 C), temperature source Oral, resp. rate 22, SpO2 96  %. Physical Exam  Constitutional: She is oriented to person, place, and time. She appears well-developed and well-nourished.  HENT:  Head: Normocephalic and atraumatic.  Mouth/Throat: No oropharyngeal exudate.  Eyes: Conjunctivae and EOM are normal. Pupils are equal, round, and reactive to light. No scleral icterus.  Neck: Normal range of motion. Neck supple. No JVD present. No tracheal deviation present. No thyromegaly present.  Cardiovascular: Exam reveals no gallop and no friction rub.   Murmur heard. Tachycardic s1, s2  Respiratory: Effort normal and breath sounds normal. No respiratory distress. She has no wheezes. She has no rales.  GI: Soft. Bowel sounds are normal. She exhibits no distension. There is no tenderness. There is no rebound and no guarding.  Musculoskeletal: Normal range of motion. She exhibits no edema or tenderness.  Lymphadenopathy:    She has no cervical adenopathy.  Neurological: She is alert and oriented to person, place, and time. She has normal reflexes. She displays normal reflexes. No cranial nerve deficit. She exhibits normal muscle tone. Coordination normal.  Skin: Skin is warm and dry. No rash noted. No erythema. No pallor.  Psychiatric: She has a normal mood and affect. Her behavior is normal. Judgment and thought content normal.     Assessment/Plan Tachycardia  ? Aflutter Check cpk, mb, trop i q6h x3 Check tsh, check urinalysis, check pt, ptt Check d dimer, and if positive then order CTA chest Check cardiac 2d echo Lovenox 20m /kg Herman x1 Start on cardizem 529mh iv since lopressor doesn't appear to be holding her hr <100.    LBBB (old) Stable  Hypothyroidism Check tsh Cont levothyroxine  Hyponatremia Hydrate gently with ns iv  Dm2 fsbs ac and qhs, iss Cont amaryl  + trop May be due to demand ischemia Check cpk, mb,  Cycle trop i q6h x3 Cont aspirin, lovenox, lipitor,   DVT prophylaxis scd and lovenox.    KIJani Gravel/20/2016,  7:28 PM

## 2014-07-13 NOTE — ED Notes (Signed)
Placed pt on cardiac monitor and obtained urine specimen.

## 2014-07-13 NOTE — ED Notes (Signed)
Wheeled pt to bathroom. HR at 140 upon re-entry to room

## 2014-07-13 NOTE — ED Notes (Signed)
Following NS bolus and 2nd dose of lopressor 5 mg IVP, pts HR irregular with rate 108-138.  Pt denies chest pain or SOB, resting quietly.  Family at bedside.

## 2014-07-14 DIAGNOSIS — E1139 Type 2 diabetes mellitus with other diabetic ophthalmic complication: Secondary | ICD-10-CM

## 2014-07-14 DIAGNOSIS — I4892 Unspecified atrial flutter: Secondary | ICD-10-CM

## 2014-07-14 LAB — URINE MICROSCOPIC-ADD ON

## 2014-07-14 LAB — GLUCOSE, CAPILLARY
GLUCOSE-CAPILLARY: 124 mg/dL — AB (ref 70–99)
Glucose-Capillary: 116 mg/dL — ABNORMAL HIGH (ref 70–99)
Glucose-Capillary: 118 mg/dL — ABNORMAL HIGH (ref 70–99)
Glucose-Capillary: 144 mg/dL — ABNORMAL HIGH (ref 70–99)
Glucose-Capillary: 115 mg/dL — ABNORMAL HIGH (ref 70–99)

## 2014-07-14 LAB — LIPID PANEL
CHOL/HDL RATIO: 2.4 ratio
CHOLESTEROL: 142 mg/dL (ref 0–200)
HDL: 58 mg/dL (ref >39)
LDL CALC: 78 mg/dL (ref 0–99)
TRIGLYCERIDES: 32 mg/dL (ref <150)
VLDL: 6 mg/dL (ref 0–40)

## 2014-07-14 LAB — URINALYSIS, ROUTINE W REFLEX MICROSCOPIC
BILIRUBIN URINE: NEGATIVE
Glucose, UA: NEGATIVE mg/dL
KETONES UR: NEGATIVE mg/dL
Nitrite: NEGATIVE
Protein, ur: NEGATIVE mg/dL
UROBILINOGEN UA: 0.2 mg/dL (ref 0.0–1.0)
pH: 6.5 (ref 5.0–8.0)

## 2014-07-14 LAB — TROPONIN I
Troponin I: 0.03 ng/mL (ref <0.031)
Troponin I: 0.03 ng/mL (ref <0.031)

## 2014-07-14 LAB — TSH: TSH: 2.369 u[IU]/mL (ref 0.350–4.500)

## 2014-07-14 LAB — MRSA PCR SCREENING: MRSA by PCR: NEGATIVE

## 2014-07-14 MED ORDER — LISINOPRIL 5 MG PO TABS
5.0000 mg | ORAL_TABLET | Freq: Every day | ORAL | Status: DC
  Administered 2014-07-15: 5 mg via ORAL
  Filled 2014-07-14: qty 1

## 2014-07-14 MED ORDER — APIXABAN 5 MG PO TABS
5.0000 mg | ORAL_TABLET | Freq: Two times a day (BID) | ORAL | Status: DC
  Administered 2014-07-14 – 2014-07-15 (×2): 5 mg via ORAL
  Filled 2014-07-14 (×2): qty 1

## 2014-07-14 MED ORDER — METOPROLOL TARTRATE 50 MG PO TABS
50.0000 mg | ORAL_TABLET | Freq: Two times a day (BID) | ORAL | Status: DC
  Administered 2014-07-14 (×2): 50 mg via ORAL
  Filled 2014-07-14 (×3): qty 1

## 2014-07-14 NOTE — Progress Notes (Signed)
  Echocardiogram 2D Echocardiogram has been performed.  Aris EvertsRix, Teana Lindahl A 07/14/2014, 11:29 AM

## 2014-07-14 NOTE — Progress Notes (Signed)
Cardizem gtt turned of. HR 60-61. BP 124/58.  Will continue to monitor.

## 2014-07-14 NOTE — Progress Notes (Signed)
ANTICOAGULATION CONSULT NOTE - Initial Consult  Pharmacy Consult for Apixaban Indication: Nonvalvular atrial fibrillation  Allergies  Allergen Reactions  . Chocolate     Hives    Patient Measurements: Height: 5\' 6"  (167.6 cm) Weight: 169 lb 5 oz (76.8 kg) IBW/kg (Calculated) : 59.3 Heparin Dosing Weight:   Vital Signs: Temp: 98 F (36.7 C) (02/21 0400) Temp Source: Oral (02/21 0400) BP: 145/95 mmHg (02/21 0900) Pulse Rate: 93 (02/21 0900)  Labs:  Recent Labs  07/13/14 1440 07/13/14 2002 07/14/14 0131 07/14/14 0810  HGB 12.1  --   --   --   HCT 37.2  --   --   --   PLT 287  --   --   --   APTT  --  32  --   --   LABPROT  --  14.4  --   --   INR  --  1.10  --   --   CREATININE 0.90  --   --   --   CKTOTAL  --  85  --   --   CKMB  --  2.7  --   --   TROPONINI 0.04* 0.03 0.03 0.03    Estimated Creatinine Clearance: 47.8 mL/min (by C-G formula based on Cr of 0.9).   Medical History: Past Medical History  Diagnosis Date  . Hypothyroidism   . Hypertension   . Type 2 diabetes mellitus   . Palpitations     PAT/EAT/NSVT, normal LVEF  . Mitral regurgitation     Mild  . Anxiety   . Hyperlipidemia   . Depression     Medications:  Scheduled:  . amLODipine  10 mg Oral Daily  . apixaban  5 mg Oral BID  . aspirin  81 mg Oral QPM  . atorvastatin  40 mg Oral q1800  . glimepiride  1 mg Oral QAC breakfast  . Influenza vac split quadrivalent PF  0.5 mL Intramuscular Tomorrow-1000  . insulin aspart  0-5 Units Subcutaneous QHS  . insulin aspart  0-9 Units Subcutaneous TID WC  . levothyroxine  25 mcg Oral QAC breakfast  . [START ON 07/15/2014] lisinopril  5 mg Oral Daily  . loratadine  10 mg Oral Daily  . metoprolol  50 mg Oral BID  . [START ON 07/15/2014] omega-3 acid ethyl esters  1 g Oral Once per day on Mon Thu  . pantoprazole  40 mg Oral Daily  . pneumococcal 23 valent vaccine  0.5 mL Intramuscular Tomorrow-1000  . sodium chloride  3 mL Intravenous Q12H     Assessment: New onset atrial fibrillation/atrial flutter in a setting of prior SVT status post Holter monitor 2010-EKGs repeat 2/21 shows either PVCs superimposed on sinus tachycardia or A. Fib Lovenox 1 mg/kg SQ every 12 hours started last evening. Last dose this morning 6 AM. Patient weight 76.8 kg SCr  0.9 Patient age 81 yo  Goal of Therapy:  Reduce risk stroke/systemic emobolism associated with nonvalvular atrial fibrillation Monitor platelets by anticoagulation protocol: Yes   Plan:  Lovenox has been discontinued Start Apixaban 5 mg po bid, starting 6 PM today. Monitor CBC, platelets Labs per protocol   Raquel JamesPittman, Ladainian Therien Bennett 07/14/2014,9:49 AM

## 2014-07-14 NOTE — Progress Notes (Addendum)
[name]:[id number] DOB: [birth year] DOA: 07/13/2014 PCP: Milana Obey, MD  Brief narrative:  81 y/o ? known Hypothyroid, Htn--Poorly controlled, Ty 2 DM, PAT + NSVT since 02/2009, Mitral Regurgitation, Anxiety, Hld, known Cervical Esophageal Web s/p Dilatation 04/17/14.  She presented from [facility] 07/13/14 after noticing that her LAD-her reading seem to show an irregular reading for ulcers. She went to the pharmacy and got the batteries on her machine checked and was noted to have a pulse rate of 140. She had no antecedent chest pain shortness of breath and fever or chills. She states however that the time that she went to the pharmacy she felt a little dizzy and lightheaded. She denies any dark or tarry stool any burning in her urine. Because of her findings in the emergency room she had a CTA chest that was negative for PE. She was started on Lovenox 1 mg a kilogram and started on Cardizem gtt. 5 mg uptitrated to 10 mg per hour in step down unit. Point-of-care troponin was 0.04. EKG on admission showed sinus tachycardia with rate of around 140 no refill discernible P waves [maybe buried in QRS complex? [Close]-rate related repolarization changes across precordial leads QRS axis   Past medical history-As per Problem list Chart reviewed as below- Reviewed  Consultants:  none  Procedures:  none  Antibiotics:  none   Subjective  Feels much better Having a hearty breakfast Daughter in the room Does not use home oxygen Daughter states that her blood pressure is only moderate  controlled at home    Objective    Interim History:   Telemetry: Sinus tachycardia/A. fib/atrial flutter   Objective: Filed Vitals:   07/14/14 0200 07/14/14 0300 07/14/14 0400 07/14/14 0500  BP: 110/76 112/60 110/63 123/63  Pulse: 119 120 122 120  Temp:   98 F (36.7 C)   TempSrc:   Oral   Resp: Height:      Weight:    76.8 kg (169 lb 5 oz)  SpO2: 94% 96% 94%  97%    Intake/Output Summary (Last 24 hours) at 07/14/14 9562 Last data filed at 07/14/14 0345  Gross per 24 hour  Intake      0 ml  Output   1600 ml  Net  -1600 ml    Exam:  General: EOMI NCAT arcus senilis, no pallor , none icteric appreciate JVD Cardiovascular: S1-S2 tachycardic, regularly irregular alternating with irregularly irregular Respiratory: Clinically clear no added sound Abdomen: Soft nontender nondistended no rebound no guarding Skin no lower extremity edema Neuro intact moving all 4 limbs equally  Data Reviewed: Basic Metabolic Panel:  Recent Labs Lab 07/13/14 1440  NA 134*  K 3.8  CL 107  CO2 21  GLUCOSE 151*  BUN 19  CREATININE 0.90  CALCIUM 9.0   Liver Function Tests: No results for input(s): AST, ALT, ALKPHOS, BILITOT, PROT, ALBUMIN in the last 168 hours. No results for input(s): LIPASE, AMYLASE in the last 168 hours. No results for input(s): AMMONIA in the last 168 hours. CBC:  Recent Labs Lab 07/13/14 1440  WBC 5.8  NEUTROABS 3.7  HGB 12.1  HCT 37.2  MCV 87.9  PLT 287   Cardiac Enzymes:  Recent Labs Lab 07/13/14 1440 07/13/14 2002 07/14/14 0131  CKTOTAL  --  85  --   CKMB  --  2.7  --   TROPONINI 0.04* 0.03 0.03   BNP: Invalid input(s): POCBNP CBG: No results for input(s): GLUCAP  in the last 168 hours.  Recent Results (from the past 240 hour(s))  MRSA PCR Screening     Status: None   Collection Time: 07/13/14  8:51 PM  Result Value Ref Range Status   MRSA by PCR NEGATIVE NEGATIVE Final    Comment:        The GeneXpert MRSA Assay (FDA approved for NASAL specimens only), is one component of a comprehensive MRSA colonization surveillance program. It is not intended to diagnose MRSA infection nor to guide or monitor treatment for MRSA infections.      Studies:              All Imaging reviewed and is as per above notation   Scheduled Meds: . amLODipine  10 mg Oral Daily  . aspirin  81 mg Oral QPM  .  atorvastatin  40 mg Oral q1800  . enoxaparin  80 mg Subcutaneous Q12H  . glimepiride  1 mg Oral QAC breakfast  . Influenza vac split quadrivalent PF  0.5 mL Intramuscular Tomorrow-1000  . insulin aspart  0-5 Units Subcutaneous QHS  . insulin aspart  0-9 Units Subcutaneous TID WC  . levothyroxine  25 mcg Oral QAC breakfast  . lisinopril  20 mg Oral Daily  . loratadine  10 mg Oral Daily  . [START ON 07/15/2014] omega-3 acid ethyl esters  1 g Oral Once per day on Mon Thu  . pantoprazole  40 mg Oral Daily  . pneumococcal 23 valent vaccine  0.5 mL Intramuscular Tomorrow-1000  . sodium chloride  3 mL Intravenous Q12H   Continuous Infusions: . sodium chloride 75 mL/hr at 07/13/14 2110  . diltiazem (CARDIZEM) infusion 10 mg/hr (07/14/14 0701)     Assessment/Plan: 1. New onset atrial fibrillation/atrial flutter in a setting of prior SVT status post Holter monitor 2010-EKGs repeat 2/21 shows either PVCs superimposed on sinus tachycardia or A. fib. I believe patient should be started on Elliquis. We will continue Cardizem infusion 10 mg per hour. I await echocardiogram to determine if she would benefit more based on results of that from addition of beta blocker or continue with calcium channel blocker. We will ask cardiology to weigh in on her care in the morning. TSH is pending [TSH in 2011 was normal]--I have downward adjusted her lisinopril from 20 mg  5 mg as we may need to uptitrate Cardizem--I've added back her usual metoprolol 50 twice a day and we will observe her heart rate.  2. ASCVD-continue aspirin in addition to Elliquis for right now 3. Mitral regurgitation-increased back pressure in right atrium may be playing a role in terms of substrate for A. fib from elevated right atrial pressure. As I cannot find any old echocardiogram, we will follow the result of this 1.  Defer management to cardiology 4. Demand ischemia type II-secondary to #1 5. Poorly controlled hypertension-takes amlodipine  10, lisinopril 20 at home, metoprolol 50 twice a day. 6. History of dysphagia status post endoscopy 04/2014-doing well-pathology from 04/17/14 showed mild active gastritis 7. Reflux, gastritis -continue pantoprazole 40 daily 8. Diabetes mellitus type 2-last A1c 2011 per note-follow results of today's test-oral Amaryl 2 mg on hold. 4 times a day before meals at bedtime coverage insulin 9. ? Reactive airway disease-continue albuterol 1 puff every 4 when necessary cautiously 10. Cervical disc disease is stable 11. Hyperlipidemia is stable  Unclear why she is on an inhaler   Code Status: Full Family Communication: Long discussion with daughter at the bedside Disposition Plan: Inpatient stepped-down  today-adjust medications later   Pleas KochJai Mailee Klaas, MD  [facility] Pager [phone] 07/14/2014, 7:27 AM    LOS: 1 day

## 2014-07-15 DIAGNOSIS — I4892 Unspecified atrial flutter: Principal | ICD-10-CM

## 2014-07-15 LAB — GLUCOSE, CAPILLARY
GLUCOSE-CAPILLARY: 80 mg/dL (ref 70–99)
Glucose-Capillary: 76 mg/dL (ref 70–99)

## 2014-07-15 LAB — HEMOGLOBIN A1C
Hgb A1c MFr Bld: 6 % — ABNORMAL HIGH (ref 4.8–5.6)
Mean Plasma Glucose: 126 mg/dL

## 2014-07-15 LAB — MAGNESIUM: MAGNESIUM: 2 mg/dL (ref 1.5–2.5)

## 2014-07-15 MED ORDER — METOPROLOL TARTRATE 50 MG PO TABS: 50.0000 mg | ORAL_TABLET | Freq: Every day | ORAL | Status: DC

## 2014-07-15 MED ORDER — LISINOPRIL 5 MG PO TABS: 5.0000 mg | ORAL_TABLET | Freq: Every day | ORAL | Status: DC

## 2014-07-15 MED ORDER — METOPROLOL TARTRATE 25 MG PO TABS: 75.0000 mg | ORAL_TABLET | Freq: Every morning | ORAL | Status: DC

## 2014-07-15 MED ORDER — METOPROLOL TARTRATE 50 MG PO TABS
75.0000 mg | ORAL_TABLET | Freq: Every morning | ORAL | Status: DC
  Administered 2014-07-15: 75 mg via ORAL
  Filled 2014-07-15 (×2): qty 1

## 2014-07-15 MED ORDER — APIXABAN 5 MG PO TABS: 5.0000 mg | ORAL_TABLET | Freq: Two times a day (BID) | ORAL | Status: DC

## 2014-07-15 NOTE — Consult Note (Signed)
CARDIOLOGY CONSULT NOTE   Patient ID: [name] MRN: [id number] DOB/AGE: 79-Oct-1928 79 y.o.  Admit Date: 07/13/2014 Referring Physician:PTH-Samtani MD Primary Physician: Milana Obey, MD Consulting Cardiologist: Dina Rich MD Primary Cardiologist: Nona Dell MD [facility]) Reason for Consultation: Atrial flutter  Clinical Summary [name] is a 81 y.o.female with known history of hypertension, hyperlipidemia,Type II diabetes, palpitations (PAT/EAT/NSVT), admitted to ER with atrial flutter, HR of 140 bpm. She was in her usual state of health, when she checked her BP and could not get an accurate reading. Went to pharmacy to see if she needed a new battery in the machine. Pharmacist checked her BP and found that it was normal, but her HR was extremely elevated. She was directed to ER.  Her granddaughter at bedside states for the last few months, she has periods of sudden breathing issue, feeling tired, and weak, which passes. The patient is very energetic and busy, not one to sit around. When the "tiredness" occurs she has sit down before it passes. She denies feeling chest pain or palpitations during those incidences.     In ER, BP 140/76, HR 142, O2 Sat 99%, Temp 98.8. D-Dimer 0.73, CT neg for PE but showed mild CHF, Na 134, Glucose 151. Creatinine 0.90, Troponin 0.04, CXR no active disease. EKG atrial flutter with LBBB, rate of 142 bpm. (LBBB not a new finding). She was treated with lopressor 5 mg IV X 2, given po lopressor, IV fluids, and started on diltiazem gtt-which was stopped around 2:30 yesterday afternoon. She was started on apixaban 5  Mg BID with CHADS VASC Score of 5, continued on po metoprolol 50 mg BID.   Currently she is without complaints. Heart rate is now in the 70's.   Allergies  Allergen Reactions  . Chocolate     Hives    Medications Scheduled Medications: . amLODipine  10 mg Oral Daily  . apixaban  5 mg Oral BID  . atorvastatin  40 mg Oral  q1800  . glimepiride  1 mg Oral QAC breakfast  . Influenza vac split quadrivalent PF  0.5 mL Intramuscular Tomorrow-1000  . insulin aspart  0-5 Units Subcutaneous QHS  . insulin aspart  0-9 Units Subcutaneous TID WC  . levothyroxine  25 mcg Oral QAC breakfast  . lisinopril  5 mg Oral Daily  . loratadine  10 mg Oral Daily  . metoprolol  50 mg Oral BID  . omega-3 acid ethyl esters  1 g Oral Once per day on Mon Thu  . pantoprazole  40 mg Oral Daily  . pneumococcal 23 valent vaccine  0.5 mL Intramuscular Tomorrow-1000  . sodium chloride  3 mL Intravenous Q12H    Infusions:    PRN Medications: acetaminophen **OR** acetaminophen, albuterol, ALPRAZolam, docusate sodium   Past Medical History  Diagnosis Date  . Hypothyroidism   . Hypertension   . Type 2 diabetes mellitus   . Palpitations     PAT/EAT/NSVT, normal LVEF  . Mitral regurgitation     Mild  . Anxiety   . Hyperlipidemia   . Depression     Past Surgical History  Procedure Laterality Date  . None    . 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 hiatal hernia. Antral erosions status post gastric biopsy.  [name] dilation N/A 04/17/2014    Procedure: [name] DILATION;  Surgeon: Corbin Ade, MD;  Location: [facility];  Service: Endoscopy;  Laterality: N/A;    Family History  Problem Relation Age of Onset  . Heart failure Mother     Died in her 7970s  . Tuberculosis Father     Died at young age  . Cancer Brother   . Arthritis    . Colon cancer      Social History [name] reports that she has never smoked. She has never used smokeless tobacco. [name] reports that she does not drink alcohol.  Review of Systems Complete review of systems are found to be negative unless outlined in H&P above.  Physical Examination Blood pressure 112/77, pulse 43, temperature 97.9 F (36.6 C), temperature  source Oral, resp. rate 15, height 5\' 6"  (1.676 m), weight 169 lb 5 oz (76.8 kg), SpO2 97 %.  Intake/Output Summary (Last 24 hours) at 07/15/14 0853 Last data filed at 07/15/14 0708  Gross per 24 hour  Intake      0 ml  Output   1550 ml  Net  -1550 ml    Telemetry: NSR, LBBB with PAC's   WGN:FAOZHYQGEN:Resting comfortably, no acute distress  HEENT: Conjunctiva and lids normal, oropharynx clear with moist mucosa. Neck: Supple, no elevated JVP or carotid bruits, no thyromegaly. Lungs: Clear to auscultation, nonlabored breathing at rest. Cardiac: Regular rate and rhythm, occasional irregular beat, no S3 or significant systolic murmur, no pericardial rub. Abdomen: Soft, nontender, no hepatomegaly, bowel sounds present, no guarding or rebound. Extremities: No pitting edema, distal pulses 2+. Skin: Warm and dry. Musculoskeletal: No kyphosis. Neuropsychiatric: Alert and oriented x3, affect grossly appropriate.  Prior Cardiac Testing/Procedures 1. Echocardiogram: 06/13/2014 Left ventricle: The cavity size was normal. Systolic function was normal. The estimated ejection fraction was in the range of 50% to 55%. Wall motion was normal; there were no regional wall motion abnormalities. There was a reduced contribution of atrial contraction to ventricular filling, due to increased ventricular diastolic pressure or atrial contractile dysfunction. Doppler parameters are consistent with a reversible restrictive pattern, indicative of decreased left ventricular diastolic compliance and/or increased left atrial pressure (grade 3 diastolic dysfunction). - Ventricular septum: Septal motion showed paradox. These changes are consistent with intraventricular conduction delay. - Aortic valve: Trileaflet; normal thickness, mildly calcified leaflets. - Mitral valve: There was moderate regurgitation. - Left atrium: The atrium was mildly to moderately dilated. - Tricuspid valve: There was  moderate regurgitation. - Pulmonary arteries: PA peak pressure: 34 mm Hg (S).  NM Stress Test 2010 Recent pharmacologic perfusion imaging study was normal (per office note) Lab Results  Basic Metabolic Panel:  Recent Labs Lab 07/13/14 1440  NA 134*  K 3.8  CL 107  CO2 21  GLUCOSE 151*  BUN 19  CREATININE 0.90  CALCIUM 9.0   CBC:  Recent Labs Lab 07/13/14 1440  WBC 5.8  NEUTROABS 3.7  HGB 12.1  HCT 37.2  MCV 87.9  PLT 287    Cardiac Enzymes:  Recent Labs Lab 07/13/14 1440 07/13/14 2002 07/14/14 0131 07/14/14 0810  CKTOTAL  --  85  --   --   CKMB  --  2.7  --   --   TROPONINI 0.04* 0.03 0.03 0.03    Radiology: Ct Angio Chest Pe W/cm &/or Wo Cm  07/13/2014   CLINICAL DATA:  Patient admitted today with Heart flutter, afib. Patient states intermittent SOBIncreased d-dimer, elevated troponin  EXAM: CT ANGIOGRAPHY CHEST WITH CONTRAST  TECHNIQUE: Multidetector CT imaging of the chest was performed using the standard protocol during bolus  administration of intravenous contrast. Multiplanar CT image reconstructions and MIPs were obtained to evaluate the vascular anatomy.  CONTRAST:  OMNIPAQUE IOHEXOL 350 MG/ML SOLN  COMPARISON:  Current chest radiograph.  FINDINGS: Angiographic study:  No evidence of a pulmonary embolus.  Mediastinum: Heart mildly enlarged. Great vessels normal in caliber. No aortic dissection. Heterogeneous hypo attenuating nodule enlarges the left thyroid lobe. Nodule measures 3 cm in greatest transverse dimension. No mediastinal or hilar masses. Calcification is seen along the right peritracheal mediastinum and subcarinal region consistent with healed granulomatous disease.  Lungs and pleura there is interstitial thickening as well as patchy areas of ground-glass opacity most evident in the lower lobes and in the posterior upper lobes. There are small effusions. No pneumothorax.  Musculoskeletal: Degenerative changes noted along the mid to lower  thoracic spine. Bones are demineralized. No osteoblastic or osteolytic lesions.  Review of the MIP images confirms the above findings.  IMPRESSION: 1. No evidence of a pulmonary embolus. 2. Hazy ground-glass opacity in the lungs, interstitial thickening, mild cardiomegaly and small effusions. Combination of findings are consistent with congestive heart failure.   Electronically Signed   By: Amie Portland M.D.   On: 07/13/2014 22:26   Dg Chest Port 1 View  07/13/2014   CLINICAL DATA:  Mesh evaluation for heart palpitations, tachycardia  EXAM: PORTABLE CHEST - 1 VIEW  COMPARISON:  03/10/2014  FINDINGS: Heart size upper normal to mildly enlarged. Normal vascular pattern. Lungs clear.  IMPRESSION: No active disease.   Electronically Signed   By: Esperanza Heir M.D.   On: 07/13/2014 15:29     ECG: Atrial flutter with LBBB, rate of 142 bpm.   Impression and Recommendations  1. Atrial flutter with RVR: She was unaware of the rapid HR, but was feeling tired and had mild dyspnea. I am suspicious of PAF as she has been having symptoms over the last few months of being "tired" with mild dyspnea which passes. Will repeat EKG.this am since she has slowed down. Mildly elevated troponin on admission, likely demand ischemia, now normalized.  Agree with apixaban with CHADS VASC Score of 5. She is no longer on diltiazem gtt, she is back on metoprolol 50 mg BID, may need to increase maintenance dose to 75 mg BID vs change to dilitazem .TSH 2.3. She was not found to be anemic. Uncertain etiology of episode. Stop ASA in the setting of NOAC use.   2. Hypertension: BP is currently well controlled She is on amlodipine 10 mg ( may have to stop if we switch her to diltiazem) lisinopril 5 mg, as well as BB. Grade 3 diastolic dysfunction. Mild CHF on admission by CT. Given one dose of po lasix. Diuresed 3.1 liters since admission.   3. Hyperlipidemia: Currently on statin. Well controlled on 07/14/2014  labs.  Signed: Bettey Mare. [name] NP [facility]  07/15/2014, 8:53 AM Co-Sign MD  Patient seen and discussed with NP Lyman Bishop, I agree with her documentation. 81 yo female hx of chronic LBBB, palpitations, mild MR, DM2, HTN, HL admitted with tachycardia. Initial EKG showed narrow complex regular tachycardia at 140. Later EKGs show what appears to be MAT. EKGs this AM shows SR with PACs. Telemetry reviewed in detail, noted episodes of MAT and wandering pacemaker. Tele shortly after admission does show aflutter with flutter waves discernible when the rate decreased.   CT PE negative for PE. TSH 2.3, Trop 0.04 then 0.03 x 2. K 3.8, will add on Mg. Echo LVEF 50-55%, restrictive diastolic  function, mod MR, mod TR. Continue rate control with lopressor and stroke prevention with eliquis. She was on lopressor 50mg  bid at home. Some noted low rates at night, will change her to 75mg  in AM and continue 50mg  in PM and follow rates today.     Dominga Ferry MD

## 2014-07-15 NOTE — Discharge Summary (Signed)
Physician Discharge Summary  Charlene HeadsSophia M [name] JYN:[id number]:[id number] DOB: 1927/02/01 DOA: 07/13/2014  PCP: Milana ObeyKNOWLTON,[name], MD  Admit date: 07/13/2014 Discharge date: 07/15/2014  Time spent: 35 minutes  Recommendations for Outpatient Follow-up:  1. Medication changes as below  Accupril-lisinopril  Metoprolol 50 twice a day-->metoprolol 75 AM, 50 p.m.   Addition of Eliquis 5 twice a day for atrial fibrillation/flutter [place] score 5 2. Will need close outpatient follow-up with cardiology  3. Consider aspirin 81 mg in addition to Eliquis if risk of ASCVD is ? -Primary-care physician to determine this  Discharge Diagnoses:  Active Problems:   Atrial flutter   Hyponatremia   Diabetes   Hypothyroidism   Tachycardia   Discharge Condition: Stable  Diet recommendation: Heart healthy low-salt  Filed Weights   07/13/14 2100 07/14/14 0500 07/15/14 0500  Weight: 76.8 kg (169 lb 5 oz) 76.8 kg (169 lb 5 oz) 76.8 kg (169 lb 5 oz)    History of present illness:  81 y/o ? known Hypothyroid, Htn--Poorly controlled, Ty 2 DM, PAT + NSVT since 02/2009, Mitral Regurgitation, Anxiety, Hld, known Cervical Esophageal Web s/p Dilatation 04/17/14. She presented from [facility] 07/13/14 after noticing that her LAD-pulse rate noted to be in 140s. She went to the pharmacy and got the batteries on her machine checked and was noted to have a pulse rate of 140. She had no antecedent chest pain shortness of breath and fever or chills. She states however that the time that she went to the pharmacy she felt a little dizzy and lightheaded. She denies any dark or tarry stool any burning in her urine. Because of her findings in the emergency room she had a CTA chest that was negative for PE. She was started on Lovenox 1 mg a kilogram and started on Cardizem gtt. 5 mg uptitrated to 10 mg per hour in step down unit. Point-of-care troponin was 0.04. EKG on admission showed sinus tachycardia with rate of around 140 no refill  discernible P waves [maybe buried in QRS complex? ]-rate related repolarization changes across precordial leads QRS axis   1. New onset atrial fibrillation/atrial flutter in a setting of prior SVT status post Holter monitor 2010-EKGs repeat 2/21 shows either PVCs superimposed on sinus tachycardia or A. fib. Initially Cardizem gtt. -->metoprolol home dose adjusted as above TSH is pending [TSH in 2011 was normal]--I have downward adjusted her lisinopril from 20 mg  5 mg  2. ASCVD-There is no firm guideline on aspirin in addition to Elliquis--outpatient physician to determine this 3. Mitral regurgitation plus acute diastolic heart failure on admission-echocardiogram = grade 3 diastolic dysfunction, with restrictive filling defect, pulmonary hypertension 34 mmHg-diuresis to 3 L with IV Lasix-no Lasix needed on discharge 4. Demand ischemia type II-secondary to #1 5. Poorly controlled hypertension-takes amlodipine 10, lisinopril 20 at home, metoprolol 50 twice a day.-See med changes as above 6. History of dysphagia status post endoscopy 04/2014-doing well-pathology from 04/17/14 showed mild active gastritis 7. Reflux, gastritis -continue pantoprazole 40 daily 8. Diabetes mellitus type 2-last A1c 2011 per note-follow results of today's test-oral Amaryl 2 mg on hold. CBG ranging 116-151 9. ? Reactive airway disease-continue albuterol 1 puff every 4 when necessary cautiously 10. Cervical disc disease is stable 11. Hyperlipidemia is stable   Cardiology saw the patient in consult-echo as above  Discharge Exam: Filed Vitals:   07/15/14 1300  BP:   Pulse: 72  Temp:   Resp: 25    General: Alert pleasant oriented no distress Cardiovascular: S1-S2  regular Respiratory: Clinically clear  Discharge Instructions    Current Discharge Medication List    START taking these medications   Details  apixaban (ELIQUIS) 5 MG TABS tablet Take 1 tablet (5 mg total) by mouth 2 (two) times daily. Qty: 60  tablet, Refills: 0    lisinopril (PRINIVIL,ZESTRIL) 5 MG tablet Take 1 tablet (5 mg total) by mouth daily. Qty: 30 tablet, Refills: 0      CONTINUE these medications which have CHANGED   Details  !! metoprolol (LOPRESSOR) 25 MG tablet Take 3 tablets (75 mg total) by mouth every morning. Qty: 90 tablet, Refills: 0    !! metoprolol (LOPRESSOR) 50 MG tablet Take 1 tablet (50 mg total) by mouth at bedtime. Qty: 30 tablet, Refills: 0     !! - Potential duplicate medications found. Please discuss with provider.    CONTINUE these medications which have NOT CHANGED   Details  ALPRAZolam (XANAX) 0.25 MG tablet Take 0.25 mg by mouth at bedtime as needed for anxiety or sleep.     amLODipine (NORVASC) 10 MG tablet Take 10 mg by mouth daily.      atorvastatin (LIPITOR) 40 MG tablet Take 40 mg by mouth daily at 6 PM.    cetirizine (ZYRTEC) 10 MG tablet Take 5-10 mg by mouth daily as needed for allergies or rhinitis.     docusate sodium (COLACE) 100 MG capsule Take 100 mg by mouth at bedtime as needed for mild constipation.     ferrous sulfate 325 (65 FE) MG tablet Take 325 mg by mouth daily with breakfast.    glimepiride (AMARYL) 2 MG tablet Take 1 mg by mouth daily before breakfast.     levothyroxine (SYNTHROID, LEVOTHROID) 50 MCG tablet Take 0.5 tablets by mouth Daily.     omeprazole (PRILOSEC) 20 MG capsule TAKE 1 CAPSULE BY MOUTH DAILY 30 MINUTES BEFORE BREAKFAST Qty: 30 capsule, Refills: 3      STOP taking these medications     aspirin 81 MG tablet      fish oil-omega-3 fatty acids 1000 MG capsule      furosemide (LASIX) 20 MG tablet      potassium chloride SA (K-DUR,KLOR-CON) 20 MEQ tablet      PROAIR HFA 108 (90 BASE) MCG/ACT inhaler      quinapril (ACCUPRIL) 20 MG tablet        Allergies  Allergen Reactions  . Chocolate     Hives      The results of significant diagnostics from this hospitalization (including imaging, microbiology, ancillary and laboratory) are  listed below for reference.    Significant Diagnostic Studies: Ct Angio Chest Pe W/cm &/or Wo Cm  07/13/2014   CLINICAL DATA:  Patient admitted today with Heart flutter, afib. Patient states intermittent SOBIncreased d-dimer, elevated troponin  EXAM: CT ANGIOGRAPHY CHEST WITH CONTRAST  TECHNIQUE: Multidetector CT imaging of the chest was performed using the standard protocol during bolus administration of intravenous contrast. Multiplanar CT image reconstructions and MIPs were obtained to evaluate the vascular anatomy.  CONTRAST:  OMNIPAQUE IOHEXOL 350 MG/ML SOLN  COMPARISON:  Current chest radiograph.  FINDINGS: Angiographic study:  No evidence of a pulmonary embolus.  Mediastinum: Heart mildly enlarged. Great vessels normal in caliber. No aortic dissection. Heterogeneous hypo attenuating nodule enlarges the left thyroid lobe. Nodule measures 3 cm in greatest transverse dimension. No mediastinal or hilar masses. Calcification is seen along the right peritracheal mediastinum and subcarinal region consistent with healed granulomatous disease.  Lungs and  pleura there is interstitial thickening as well as patchy areas of ground-glass opacity most evident in the lower lobes and in the posterior upper lobes. There are small effusions. No pneumothorax.  Musculoskeletal: Degenerative changes noted along the mid to lower thoracic spine. Bones are demineralized. No osteoblastic or osteolytic lesions.  Review of the MIP images confirms the above findings.  IMPRESSION: 1. No evidence of a pulmonary embolus. 2. Hazy ground-glass opacity in the lungs, interstitial thickening, mild cardiomegaly and small effusions. Combination of findings are consistent with congestive heart failure.   Electronically Signed   By: Amie Portland M.D.   On: 07/13/2014 22:26   Dg Chest Port 1 View  07/13/2014   CLINICAL DATA:  Mesh evaluation for heart palpitations, tachycardia  EXAM: PORTABLE CHEST - 1 VIEW  COMPARISON:  03/10/2014   FINDINGS: Heart size upper normal to mildly enlarged. Normal vascular pattern. Lungs clear.  IMPRESSION: No active disease.   Electronically Signed   By: Esperanza Heir M.D.   On: 07/13/2014 15:29    Microbiology: Recent Results (from the past 240 hour(s))  MRSA PCR Screening     Status: None   Collection Time: 07/13/14  8:51 PM  Result Value Ref Range Status   MRSA by PCR NEGATIVE NEGATIVE Final    Comment:        The GeneXpert MRSA Assay (FDA approved for NASAL specimens only), is one component of a comprehensive MRSA colonization surveillance program. It is not intended to diagnose MRSA infection nor to guide or monitor treatment for MRSA infections.      Labs: Basic Metabolic Panel:  Recent Labs Lab 07/13/14 1440 07/15/14 1054  NA 134*  --   K 3.8  --   CL 107  --   CO2 21  --   GLUCOSE 151*  --   BUN 19  --   CREATININE 0.90  --   CALCIUM 9.0  --   MG  --  2.0   Liver Function Tests: No results for input(s): AST, ALT, ALKPHOS, BILITOT, PROT, ALBUMIN in the last 168 hours. No results for input(s): LIPASE, AMYLASE in the last 168 hours. No results for input(s): AMMONIA in the last 168 hours. CBC:  Recent Labs Lab 07/13/14 1440  WBC 5.8  NEUTROABS 3.7  HGB 12.1  HCT 37.2  MCV 87.9  PLT 287   Cardiac Enzymes:  Recent Labs Lab 07/13/14 1440 07/13/14 2002 07/14/14 0131 07/14/14 0810  CKTOTAL  --  85  --   --   CKMB  --  2.7  --   --   TROPONINI 0.04* 0.03 0.03 0.03   BNP: BNP (last 3 results) No results for input(s): BNP in the last 8760 hours.  ProBNP (last 3 results) No results for input(s): PROBNP in the last 8760 hours.  CBG:  Recent Labs Lab 07/14/14 1142 07/14/14 1559 07/14/14 1938 07/15/14 0736 07/15/14 1158  GLUCAP 124* 144* 115* 80 76       Signed:  Rhetta Mura  [facility] 07/15/2014, 2:02 PM

## 2014-07-15 NOTE — Progress Notes (Signed)
Patient discharged to home with family. Instructions given regarding meds, follow up appointment, activity, and reasons to call 911. Patient was able to teach back information. Further teaching given regarding bleeding precautions and eliquis.

## 2014-07-15 NOTE — Care Management Note (Signed)
    Page 1 of 1   07/15/2014     2:12:24 PM CARE MANAGEMENT NOTE 07/15/2014  Patient:  [name],[name]   Account Number:  [id number]  Date Initiated:  07/15/2014  Documentation initiated by:  Kathyrn SheriffHILDRESS,JESSICA  Subjective/Objective Assessment:   Pt admitted with new onset Afib. Pt is from home, lives with husband and independnet with ADL's. Pt uses cane PRN and still drives. Pt has no HH services or DME's.     Action/Plan:   Pt plans to discharge home today with self care. Pt will newly be on Eliquis. Benefits check done and no pre-auth is required. Pt's copay will be $7.50. Pt notified and given discount care. Pt has no further CM needs.   Anticipated DC Date:  07/15/2014   Anticipated DC Plan:  HOME/SELF CARE      DC Planning Services  CM consult      Choice offered to / List presented to:             Status of service:  Completed, signed off Medicare Important Message given?   (If response is "NO", the following Medicare IM given date fields will be blank) Date Medicare IM given:   Medicare IM given by:   Date Additional Medicare IM given:   Additional Medicare IM given by:    Discharge Disposition:  HOME/SELF CARE  Per UR Regulation:  Reviewed for med. necessity/level of care/duration of stay  If discussed at Long Length of Stay Meetings, dates discussed:    Comments:  07/15/2014 1400 Kathyrn SheriffJessica Childress, RN, MSN, CM

## 2014-07-15 NOTE — Care Management Utilization Note (Signed)
UR completed 

## 2014-07-18 ENCOUNTER — Ambulatory Visit: Payer: Self-pay | Admitting: Nurse Practitioner

## 2014-07-30 ENCOUNTER — Telehealth: Payer: Self-pay | Admitting: Nurse Practitioner

## 2014-07-30 NOTE — Telephone Encounter (Signed)
Pt called today saying that she has OV tomorrow, but she went to the ED and they said that she had something wrong with a valve in her heart and she is feeling weak. She asked to speak with the nurse about this and whether or not she still needed to come tomorrow. I told her nurses were with patients but I would let them be aware and someone would call her back. She agreed. [phone]

## 2014-07-30 NOTE — Telephone Encounter (Signed)
Called pt and states that her daughter is going to bring her to her appointment

## 2014-07-31 ENCOUNTER — Ambulatory Visit (INDEPENDENT_AMBULATORY_CARE_PROVIDER_SITE_OTHER): Payer: Medicare Other | Admitting: Nurse Practitioner

## 2014-07-31 ENCOUNTER — Encounter: Payer: Self-pay | Admitting: Nurse Practitioner

## 2014-07-31 VITALS — BP 136/68 | HR 66 | Temp 97.4°F | Ht 66.0 in | Wt 174.8 lb

## 2014-07-31 DIAGNOSIS — R1314 Dysphagia, pharyngoesophageal phase: Secondary | ICD-10-CM

## 2014-07-31 NOTE — Progress Notes (Signed)
Referring Provider: Gareth [name], Steve, MD Primary Care Physician:  Charlene ObeyKNOWLTON,[name], MD Primary GI:  Dr. Jena Gaussourk  Chief Complaint  Patient presents with  . Follow-up    HPI:   81 year old female presents for follow-up after EGD with dilation. 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 Pathology showed chronic gastritic and negative for H. Pylori.  Today states she's doing better. Is able to eat more normally, but is still occasionally having some instances of regurgitation of a snot-mucus like material that does not resemble food. She's still crushing large pills. Denies hematochezia and melena. Has some dark stools on iron supplementation but darker stools are still formed and normal. Denies any other upper or lower GI symptoms.    Past Medical History  Diagnosis Date  . Hypothyroidism   . Hypertension   . Type 2 diabetes mellitus   . Palpitations     PAT/EAT/NSVT, normal LVEF  . Mitral regurgitation     Mild  . Anxiety   . Hyperlipidemia   . Depression     Past Surgical History  Procedure Laterality Date  . None    . 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 hiatal hernia. Antral erosions status post gastric biopsy.  [name]. Maloney dilation N/A 04/17/2014    Procedure: Charlene HashimotoMALONEY DILATION;  Surgeon: Corbin Adeobert M Rourk, MD;  Location: [facility];  Service: Endoscopy;  Laterality: N/A;    Current Outpatient Prescriptions  Medication Sig Dispense Refill  . albuterol (PROVENTIL HFA;VENTOLIN HFA) 108 (90 BASE) MCG/ACT inhaler Inhale 1 puff into the lungs every 6 (six) hours as needed for wheezing or shortness of breath.    . ALPRAZolam (XANAX) 0.25 MG tablet Take 0.25 mg by mouth at bedtime as needed for anxiety or sleep.     Marland Kitchen.  amLODipine (NORVASC) 10 MG tablet Take 10 mg by mouth daily.      Marland Kitchen. apixaban (ELIQUIS) 5 MG TABS tablet Take 1 tablet (5 mg total) by mouth 2 (two) times daily. 60 tablet 0  . aspirin 81 MG tablet Take 81 mg by mouth daily.    Marland Kitchen. atorvastatin (LIPITOR) 40 MG tablet Take 40 mg by mouth daily at 6 PM.    . cetirizine (ZYRTEC) 10 MG tablet Take 5-10 mg by mouth daily as needed for allergies or rhinitis.     . ferrous sulfate 325 (65 FE) MG tablet Take 325 mg by mouth daily with breakfast.    . furosemide (LASIX) 20 MG tablet Take 20 mg by mouth as needed.    Marland Kitchen. glimepiride (AMARYL) 2 MG tablet Take 1 mg by mouth daily before breakfast.     . levothyroxine (SYNTHROID, LEVOTHROID) 50 MCG tablet Take 0.5 tablets by mouth Daily.     . metoprolol (LOPRESSOR) 25 MG tablet Take 3 tablets (75 mg total) by mouth every morning. 90 tablet 0  . metoprolol (LOPRESSOR) 50 MG tablet Take 1 tablet (50 mg total) by mouth at bedtime. 30 tablet 0  . omeprazole (PRILOSEC) 20 MG capsule TAKE 1 CAPSULE BY MOUTH DAILY 30 MINUTES BEFORE BREAKFAST 30 capsule 3  . potassium chloride SA (K-DUR,KLOR-CON) 20 MEQ tablet Take 20 mEq by mouth 2 (two) times daily.    . quinapril (ACCUPRIL) 20 MG tablet Take 20 mg by mouth at  bedtime.    . docusate sodium (COLACE) 100 MG capsule Take 100 mg by mouth at bedtime as needed for mild constipation.     Marland Kitchen lisinopril (PRINIVIL,ZESTRIL) 5 MG tablet Take 1 tablet (5 mg total) by mouth daily. (Patient not taking: Reported on 07/31/2014) 30 tablet 0   No current facility-administered medications for this visit.    Allergies as of 07/31/2014 - Review Complete 07/31/2014  Allergen Reaction Noted  . Chocolate  03/10/2014    Family History  Problem Relation Age of Onset  . Heart failure Mother     Died in her 63s  . Tuberculosis Father     Died at young age  . Cancer Brother   . Arthritis    . Colon cancer      History   Social History  . Marital Status: Married    Spouse Name: N/A    . Number of Children: N/A  . Years of Education: 11   Occupational History  . Retired   .     Social History Main Topics  . Smoking status: Never Smoker   . Smokeless tobacco: Never Used  . Alcohol Use: No  . Drug Use: No  . Sexual Activity: Not on file   Other Topics Concern  . None   Social History Narrative    Review of Systems: Gen: Denies fever, chills, anorexia. Denies fatigue, weakness, weight loss.  CV: Denies chest pain, palpitations, syncope, peripheral edema, and claudication. Resp: Denies dyspnea at rest, cough, wheezing, coughing up blood, and pleurisy. GI: Denies vomiting blood, jaundice, and fecal incontinence.   Denies dysphagia or odynophagia. Derm: Denies rash, itching, dry skin Psych: Denies depression, anxiety, memory loss, confusion. No homicidal or suicidal ideation.  Heme: Denies bruising, bleeding, and enlarged lymph nodes.  Physical Exam: BP 136/68 mmHg  Pulse 66  Temp(Src) 97.4 F (36.3 C) (Oral)  Ht  (1.676 m)  Wt 174 lb 12.8 oz (79.289 kg)  BMI 28.23 kg/m2 General:   Alert and oriented. No distress noted. Pleasant and cooperative.  Head:  Normocephalic and atraumatic. Eyes:  Conjuctiva clear without scleral icterus. Mouth:  Oral mucosa pink and moist. Good dentition. No lesions. Neck:  Supple, without mass or thyromegaly. Lungs:  Clear to auscultation bilaterally. No wheezes, rales, or rhonchi. No distress.  Heart:  S1, S2 present without murmurs, rubs, or gallops. Regular rate and rhythm. Abdomen:  +BS, soft, non-tender and non-distended. No rebound or guarding. No HSM or masses noted. Extremities:  Without edema. Neurologic:  Alert and  oriented x4;  grossly normal neurologically. Skin:  Intact without significant lesions or rashes. Psych:  Alert and cooperative. Normal mood and affect.    07/31/2014 1:50 PM

## 2014-07-31 NOTE — Assessment & Plan Note (Signed)
Dysphagia and regurgitation last visit, status post EGD with dilation of critical Schatzki's ring and also noted possible cervical web. Good result per procedure note. Symptoms vastly improved. Eating more normal now, is still cautious about meats and pills. Occasional regurgitation/coughing up mucus-like material seems to be more likely nasopharyngeal drainage then food substances. Denies overt GI bleeding. Return in 6 months for follow-up or sooner as needed.

## 2014-07-31 NOTE — Patient Instructions (Signed)
1. Continue taking the omeprazole 20 mg daily as you have been 2. Talk to your primary care provider about the increased congestion 3. Return for a follow-up in 6 months or sooner if you have more symptoms 4. If you need to add more for constipation, can try Miralax one capful three times a week (or as often as daily, if needed)

## 2014-08-01 NOTE — Progress Notes (Signed)
cc'ed to pcp °

## 2014-08-02 ENCOUNTER — Other Ambulatory Visit: Payer: Self-pay | Admitting: Cardiology

## 2014-08-12 ENCOUNTER — Telehealth: Payer: Self-pay | Admitting: Cardiology

## 2014-08-12 MED ORDER — APIXABAN 5 MG PO TABS: 5.0000 mg | ORAL_TABLET | Freq: Two times a day (BID) | ORAL | Status: DC

## 2014-08-12 NOTE — Telephone Encounter (Signed)
Patient informed that prior authorization for eliquis is in progress and we are awaiting response from her insurance company. Patient advised that she can get samples to use until prior authorization is taken care of.

## 2014-09-09 ENCOUNTER — Encounter (HOSPITAL_COMMUNITY): Payer: Self-pay | Admitting: *Deleted

## 2014-09-09 ENCOUNTER — Emergency Department (HOSPITAL_COMMUNITY): Payer: Medicare Other

## 2014-09-09 ENCOUNTER — Inpatient Hospital Stay (HOSPITAL_COMMUNITY)
Admission: EM | Admit: 2014-09-09 | Discharge: 2014-09-11 | DRG: 309 | Disposition: A | Discharge status: Home / Self Care | Payer: Medicare Other | Attending: Internal Medicine | Admitting: Internal Medicine

## 2014-09-09 ENCOUNTER — Telehealth: Payer: Self-pay | Admitting: Physician Assistant

## 2014-09-09 DIAGNOSIS — I4892 Unspecified atrial flutter: Secondary | ICD-10-CM

## 2014-09-09 DIAGNOSIS — Z800 Family history of malignant neoplasm of digestive organs: Secondary | ICD-10-CM

## 2014-09-09 DIAGNOSIS — I129 Hypertensive chronic kidney disease with stage 1 through stage 4 chronic kidney disease, or unspecified chronic kidney disease: Secondary | ICD-10-CM | POA: Diagnosis present

## 2014-09-09 DIAGNOSIS — Z7901 Long term (current) use of anticoagulants: Secondary | ICD-10-CM | POA: Diagnosis not present

## 2014-09-09 DIAGNOSIS — I447 Left bundle-branch block, unspecified: Secondary | ICD-10-CM | POA: Diagnosis present

## 2014-09-09 DIAGNOSIS — N189 Chronic kidney disease, unspecified: Secondary | ICD-10-CM | POA: Diagnosis present

## 2014-09-09 DIAGNOSIS — E782 Mixed hyperlipidemia: Secondary | ICD-10-CM | POA: Diagnosis present

## 2014-09-09 DIAGNOSIS — E119 Type 2 diabetes mellitus without complications: Secondary | ICD-10-CM | POA: Diagnosis present

## 2014-09-09 DIAGNOSIS — F419 Anxiety disorder, unspecified: Secondary | ICD-10-CM | POA: Diagnosis present

## 2014-09-09 DIAGNOSIS — I248 Other forms of acute ischemic heart disease: Secondary | ICD-10-CM | POA: Diagnosis present

## 2014-09-09 DIAGNOSIS — E039 Hypothyroidism, unspecified: Secondary | ICD-10-CM | POA: Diagnosis present

## 2014-09-09 DIAGNOSIS — E1139 Type 2 diabetes mellitus with other diabetic ophthalmic complication: Secondary | ICD-10-CM | POA: Diagnosis not present

## 2014-09-09 DIAGNOSIS — I5032 Chronic diastolic (congestive) heart failure: Secondary | ICD-10-CM | POA: Diagnosis present

## 2014-09-09 DIAGNOSIS — D649 Anemia, unspecified: Secondary | ICD-10-CM | POA: Diagnosis present

## 2014-09-09 DIAGNOSIS — I10 Essential (primary) hypertension: Secondary | ICD-10-CM | POA: Diagnosis not present

## 2014-09-09 DIAGNOSIS — I4891 Unspecified atrial fibrillation: Principal | ICD-10-CM | POA: Diagnosis present

## 2014-09-09 DIAGNOSIS — F329 Major depressive disorder, single episode, unspecified: Secondary | ICD-10-CM | POA: Diagnosis present

## 2014-09-09 DIAGNOSIS — Z8249 Family history of ischemic heart disease and other diseases of the circulatory system: Secondary | ICD-10-CM | POA: Diagnosis not present

## 2014-09-09 LAB — CBC WITH DIFFERENTIAL/PLATELET
Basophils Absolute: 0 10*3/uL (ref 0.0–0.1)
Basophils Relative: 1 % (ref 0–1)
EOS ABS: 0.1 10*3/uL (ref 0.0–0.7)
Eosinophils Relative: 1 % (ref 0–5)
HCT: 36.2 % (ref 36.0–46.0)
HEMOGLOBIN: 11.7 g/dL — AB (ref 12.0–15.0)
LYMPHS ABS: 1.9 10*3/uL (ref 0.7–4.0)
Lymphocytes Relative: 34 % (ref 12–46)
MCH: 28.3 pg (ref 26.0–34.0)
MCHC: 32.3 g/dL (ref 30.0–36.0)
MCV: 87.7 fL (ref 78.0–100.0)
MONOS PCT: 6 % (ref 3–12)
Monocytes Absolute: 0.3 10*3/uL (ref 0.1–1.0)
Neutro Abs: 3.2 10*3/uL (ref 1.7–7.7)
Neutrophils Relative %: 58 % (ref 43–77)
Platelets: 260 10*3/uL (ref 150–400)
RBC: 4.13 MIL/uL (ref 3.87–5.11)
RDW: 14.5 % (ref 11.5–15.5)
WBC: 5.5 10*3/uL (ref 4.0–10.5)

## 2014-09-09 LAB — COMPREHENSIVE METABOLIC PANEL
ALBUMIN: 3.6 g/dL (ref 3.5–5.2)
ALT: 20 U/L (ref 0–35)
AST: 24 U/L (ref 0–37)
Alkaline Phosphatase: 88 U/L (ref 39–117)
Anion gap: 9 (ref 5–15)
BUN: 19 mg/dL (ref 6–23)
CALCIUM: 9.1 mg/dL (ref 8.4–10.5)
CO2: 22 mmol/L (ref 19–32)
CREATININE: 1 mg/dL (ref 0.50–1.10)
Chloride: 107 mmol/L (ref 96–112)
GFR calc Af Amer: 58 mL/min — ABNORMAL LOW (ref >90)
GFR, EST NON AFRICAN AMERICAN: 50 mL/min — AB (ref >90)
Glucose, Bld: 147 mg/dL — ABNORMAL HIGH (ref 70–99)
Potassium: 3.8 mmol/L (ref 3.5–5.1)
Sodium: 138 mmol/L (ref 135–145)
Total Bilirubin: 0.7 mg/dL (ref 0.3–1.2)
Total Protein: 7.2 g/dL (ref 6.0–8.3)

## 2014-09-09 LAB — PROTIME-INR
INR: 1.29 (ref 0.00–1.49)
Prothrombin Time: 16.2 seconds — ABNORMAL HIGH (ref 11.6–15.2)

## 2014-09-09 LAB — TROPONIN I: TROPONIN I: 0.05 ng/mL — AB (ref <0.031)

## 2014-09-09 MED ORDER — QUINAPRIL HCL 10 MG PO TABS
20.0000 mg | ORAL_TABLET | Freq: Every day | ORAL | Status: DC
  Filled 2014-09-09 (×2): qty 2

## 2014-09-09 MED ORDER — INSULIN ASPART 100 UNIT/ML ~~LOC~~ SOLN
0.0000 [IU] | SUBCUTANEOUS | Status: DC
  Administered 2014-09-10: 2 [IU] via SUBCUTANEOUS
  Administered 2014-09-10: 3 [IU] via SUBCUTANEOUS
  Administered 2014-09-11: 1 [IU] via SUBCUTANEOUS

## 2014-09-09 MED ORDER — DEXTROSE 5 % IV SOLN
5.0000 mg/h | INTRAVENOUS | Status: DC
  Administered 2014-09-09: 5 mg/h via INTRAVENOUS
  Filled 2014-09-09: qty 100

## 2014-09-09 MED ORDER — SODIUM CHLORIDE 0.9 % IJ SOLN
3.0000 mL | Freq: Two times a day (BID) | INTRAMUSCULAR | Status: DC
  Administered 2014-09-10 – 2014-09-11 (×4): 3 mL via INTRAVENOUS

## 2014-09-09 MED ORDER — APIXABAN 5 MG PO TABS
5.0000 mg | ORAL_TABLET | Freq: Two times a day (BID) | ORAL | Status: DC
  Administered 2014-09-10 – 2014-09-11 (×4): 5 mg via ORAL
  Filled 2014-09-09 (×4): qty 1

## 2014-09-09 MED ORDER — LEVOTHYROXINE SODIUM 25 MCG PO TABS
25.0000 ug | ORAL_TABLET | Freq: Every day | ORAL | Status: DC
  Administered 2014-09-10 – 2014-09-11 (×2): 25 ug via ORAL
  Filled 2014-09-09 (×2): qty 1

## 2014-09-09 MED ORDER — SODIUM CHLORIDE 0.9 % IJ SOLN: 3.0000 mL | INTRAMUSCULAR | Status: DC | PRN

## 2014-09-09 MED ORDER — SODIUM CHLORIDE 0.9 % IJ SOLN
3.0000 mL | Freq: Two times a day (BID) | INTRAMUSCULAR | Status: DC
  Administered 2014-09-10 (×2): 3 mL via INTRAVENOUS

## 2014-09-09 MED ORDER — SODIUM CHLORIDE 0.9 % IV SOLN: 250.0000 mL | INTRAVENOUS | Status: DC | PRN

## 2014-09-09 MED ORDER — METOPROLOL TARTRATE 50 MG PO TABS
50.0000 mg | ORAL_TABLET | Freq: Two times a day (BID) | ORAL | Status: DC
  Administered 2014-09-10 (×2): 50 mg via ORAL
  Filled 2014-09-09 (×2): qty 1

## 2014-09-09 MED ORDER — DILTIAZEM LOAD VIA INFUSION
5.0000 mg | Freq: Once | INTRAVENOUS | Status: AC
  Administered 2014-09-09: 5 mg via INTRAVENOUS
  Filled 2014-09-09: qty 5

## 2014-09-09 NOTE — ED Notes (Signed)
Elevated heart rate family says 140's for 2 days.  Feels sob.

## 2014-09-09 NOTE — H&P (Addendum)
Hospitalist Admission History and Physical  Patient name: [name] Medical record number: [id number] Date of birth: 05/09/1927 Age: 81 y.o. Gender: female  Primary Care Provider: Milana Obey, MD  Chief Complaint: atrial fibrillation w/ RVR, elevated troponin   History of Present Illness:This is a 81 y.o. year old female with significant past medical history of atrial fibrillation/atrial flutter on eliquuis, diastolic CHF, NIDDM, hypothyroidism, HTN presenting with atrial fibrillation w/ RVR, elevated troponin. Family reports pt w/ elevated HR at home over the past 2 days. Pt has not had any significant complaints apart from dyspnea. Pt noted to have been admitted 06/2014 for new onset atrial fibrillation/atrial flutter. D/c'd on BB and eliquuis. Pt/family report compliance w/ medication. Synthroid dose stable. Family does report daily caffeine/coffee intake. Unsure if pt has had any worsening LE swelling. Deny any excess salt intake. No CP.  Presented to ER T 98.7, HR 140s, resp 10s-20s, BP 100s-110s, satting 100% on RA. CBC and CMET grossly WNL. EKG w/ wide complex tachycardia in setting of pre-existing LBB from previous EKGs. Trop 0.05.  EDP [place] pending call back from cardiology at [facility] cone  Assessment and Plan: [name] is a 81 y.o. year old female presenting with atrial fibrillation w/ RVR, elevated trop    Active Problems:   Atrial fibrillation with RVR   1- Atrial Fibrillation w/ RVR -dilt gtt started in ER -noted wide complex tachycardia on EKG in setting of hx/o LBBB-? Intermittent super-imposed afib -rate appears to be fairly regular  -repeat EKG -cont eliquuis -f/u on cards recs in discussion w/ EDP  -follow closely   2- Elevated trop  -likely mild demand ischemia/mismatch in setting of above -no active CP currently  -treat as above -cycle CEs -tele bed -follow closely   3-diastolic CHF -2D ECHO 06/2014 w/ EF 50-55% and grade 3 diastolic  dysfunction -CXR w/ mild interstitial prominence concerning for possible edema -noted marked LE swelling on exam -D/c weight 06/2014-76kg -admission wt 78 kg -? If contributing to above -BNP to correlate -gently diurese as clinically indicated -TED hose   4-NIDDM -SSI  -A1C  5-Hypothyroidism -cont synthroid   FEN/GI: heart healthy carb modified diet  Prophylaxis: eliquuis  Disposition: pending further evaluation  Code Status:Full Code    Patient Active Problem List   Diagnosis Date Noted  . Atrial fibrillation with RVR 09/09/2014  . Atrial flutter 07/13/2014  . Hyponatremia 07/13/2014  . Diabetes 07/13/2014  . Hypothyroidism 07/13/2014  . Tachycardia 07/13/2014  . Dysphagia, pharyngoesophageal phase 04/16/2014  . Neurogenic pain of lower extremity 03/15/2012  . Left bundle branch block 03/02/2011  . RENAL INSUFFICIENCY 05/22/2010  . Essential hypertension, benign 10/09/2009  . Mitral regurgitation 10/09/2009  . HYPERLIPIDEMIA-MIXED 02/12/2009  . Palpitations 02/12/2009   Past Medical History: Past Medical History  Diagnosis Date  . Hypothyroidism   . Hypertension   . Type 2 diabetes mellitus   . Palpitations     PAT/EAT/NSVT, normal LVEF  . Mitral regurgitation     Mild  . Anxiety   . Hyperlipidemia   . Depression     Past Surgical History: Past Surgical History  Procedure Laterality Date  . None    . 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 hiatal hernia. Antral erosions status post gastric biopsy.  [name] dilation N/A 04/17/2014    Procedure: [name] DILATION;  Surgeon: Corbin Ade, MD;  Location: [facility];  Service: Endoscopy;  Laterality: N/A;    Social History: History   Social History  . Marital Status: Married    Spouse Name: N/A  . Number of Children: N/A  . Years of Education: 11   Occupational History  . Retired    .     Social History Main Topics  . Smoking status: Never Smoker   . Smokeless tobacco: Never Used  . Alcohol Use: No  . Drug Use: No  . Sexual Activity: Not on file   Other Topics Concern  . None   Social History Narrative    Family History: Family History  Problem Relation Age of Onset  . Heart failure Mother     Died in her 2470s  . Tuberculosis Father     Died at young age  . Cancer Brother   . Arthritis    . Colon cancer      Allergies: Allergies  Allergen Reactions  . Chocolate     Hives    Current Facility-Administered Medications  Medication Dose Route Frequency Provider Last Rate Last Dose  . 0.9 %  sodium chloride infusion  250 mL Intravenous PRN Floydene FlockSteven J Adelis Docter, MD      . Melene Muller[START ON 09/10/2014] apixaban (ELIQUIS) tablet 5 mg  5 mg Oral BID Floydene FlockSteven J Szymon Foiles, MD      . diltiazem (CARDIZEM) 100 mg in dextrose 5 % 100 mL (1 mg/mL) infusion  5-15 mg/hr Intravenous Continuous Benjiman CoreNathan Pickering, MD 10 mL/hr at 09/09/14 2323 10 mg/hr at 09/09/14 2323  . [START ON 09/10/2014] insulin aspart (novoLOG) injection 0-9 Units  0-9 Units Subcutaneous 6 times per day Floydene FlockSteven J Guerino Caporale, MD      . Melene Muller[START ON 09/10/2014] levothyroxine (SYNTHROID, LEVOTHROID) tablet 25 mcg  25 mcg Oral QAC breakfast Floydene FlockSteven J Lachlan Mckim, MD      . Melene Muller[START ON 09/10/2014] metoprolol (LOPRESSOR) tablet 50 mg  50 mg Oral BID Floydene FlockSteven J Sidonia Nutter, MD      . Melene Muller[START ON 09/10/2014] quinapril (ACCUPRIL) tablet 20 mg  20 mg Oral QHS Floydene FlockSteven J Valery Chance, MD      . Melene Muller[START ON 09/10/2014] sodium chloride 0.9 % injection 3 mL  3 mL Intravenous Q12H Floydene FlockSteven J Witt Plitt, MD      . Melene Muller[START ON 09/10/2014] sodium chloride 0.9 % injection 3 mL  3 mL Intravenous Q12H Floydene FlockSteven J Cara Thaxton, MD      . sodium chloride 0.9 % injection 3 mL  3 mL Intravenous PRN Floydene FlockSteven J Ory Elting, MD       Current Outpatient Prescriptions  Medication Sig Dispense Refill  . albuterol (PROVENTIL HFA;VENTOLIN HFA) 108 (90 BASE) MCG/ACT inhaler Inhale 1 puff into the lungs every 6  (six) hours as needed for wheezing or shortness of breath.    . ALPRAZolam (XANAX) 0.25 MG tablet Take 0.25 mg by mouth at bedtime as needed for anxiety or sleep.     Marland Kitchen. amLODipine (NORVASC) 10 MG tablet Take 10 mg by mouth daily.      Marland Kitchen. apixaban (ELIQUIS) 5 MG TABS tablet Take 1 tablet (5 mg total) by mouth 2 (two) times daily. 14 tablet 0  . atorvastatin (LIPITOR) 40 MG tablet Take 40 mg by mouth daily at 6 PM.    . cetirizine (ZYRTEC) 10 MG tablet Take 5-10 mg by mouth daily as needed for allergies or rhinitis.     Marland Kitchen. docusate sodium (COLACE) 100 MG capsule Take 100 mg by mouth at bedtime as needed for mild constipation.     .Marland Kitchen  ferrous sulfate 325 (65 FE) MG tablet Take 325 mg by mouth daily with breakfast.    . furosemide (LASIX) 20 MG tablet Take 20 mg by mouth daily as needed for fluid or edema.     Marland Kitchen glimepiride (AMARYL) 2 MG tablet Take 1 mg by mouth daily before breakfast.     . levothyroxine (SYNTHROID, LEVOTHROID) 25 MCG tablet Take 25 mcg by mouth daily before breakfast.    . lisinopril (PRINIVIL,ZESTRIL) 5 MG tablet TAKE 1 TABLET BY MOUTH DAILY. 30 tablet 6  . metoprolol (LOPRESSOR) 50 MG tablet Take 1 tablet (50 mg total) by mouth at bedtime. 30 tablet 0  . metoprolol tartrate (LOPRESSOR) 25 MG tablet TAKE 3 TABLETS BY MOUTH EVERY MORNING. 90 tablet 6  . quinapril (ACCUPRIL) 20 MG tablet Take 20 mg by mouth at bedtime.    Marland Kitchen omeprazole (PRILOSEC) 20 MG capsule TAKE 1 CAPSULE BY MOUTH DAILY 30 MINUTES BEFORE BREAKFAST (Patient not taking: Reported on 09/09/2014) 30 capsule 3  . potassium chloride SA (K-DUR,KLOR-CON) 20 MEQ tablet Take 20 mEq by mouth 2 (two) times daily.     Review Of Systems: 12 point ROS negative except as noted above in HPI.  Physical Exam: Filed Vitals:   09/09/14 2330  BP: 110/63  Pulse: 140  Temp:   Resp: 17    General: alert and cooperative HEENT: PERRLA and extra ocular movement intact Heart: S1, S2 normal, no murmur, rub or gallop, regular rate and  rhythm Lungs: clear to auscultation, no wheezes or rales and unlabored breathing Abdomen: abdomen is soft without significant tenderness, masses, organomegaly or guarding Extremities: 3+ pitting edema bilaterally, distal pulses intact  Skin:no rashes Neurology: normal without focal findings  Labs and Imaging: Lab Results  Component Value Date/Time   NA 138 09/09/2014 10:15 PM   K 3.8 09/09/2014 10:15 PM   CL 107 09/09/2014 10:15 PM   CO2 22 09/09/2014 10:15 PM   BUN 19 09/09/2014 10:15 PM   CREATININE 1.00 09/09/2014 10:15 PM   GLUCOSE 147* 09/09/2014 10:15 PM   Lab Results  Component Value Date   WBC 5.5 09/09/2014   HGB 11.7* 09/09/2014   HCT 36.2 09/09/2014   MCV 87.7 09/09/2014   PLT 260 09/09/2014    Dg Chest Portable 1 View  09/09/2014   CLINICAL DATA:  Tachycardia  EXAM: PORTABLE CHEST - 1 VIEW  COMPARISON:  07/13/2014 CT  FINDINGS: Heart size mildly enlarged. Scattered atherosclerosis of the aortic arch. Mild interstitial prominence. No overt effusion. No pneumothorax. Multilevel degenerative change.  IMPRESSION: Mild interstitial prominence can be seen in the setting of interstitial edema, atypical/viral infection, or pneumonitis.   Electronically Signed   By: Jearld Lesch M.D.   On: 09/09/2014 23:02           Doree Albee MD  Pager: [phone]

## 2014-09-09 NOTE — Telephone Encounter (Signed)
Daughter called. Patient's HR 140 half of yesterday and all day today.  I told her to take a an extra 25mg  of lopressor tonight(75mg  total) if BP over 110/60.  She said she was fatigued and SOB.  I told her she should probably come to the ER if she continues to be symptomatic.  The daughter was not with her at the moment.    Ethylene Reznick, PAC

## 2014-09-09 NOTE — ED Provider Notes (Signed)
CSN: [id number]     Arrival date & time 09/09/14  2123 History   First MD Initiated Contact with Patient 09/09/14 2147     Chief Complaint  Patient presents with  . Tachycardia     (Consider location/radiation/quality/duration/timing/severity/associated sxs/prior Treatment) The history is provided by the patient.   patient is felt more fatigued and shortness of breath today. Recent admission for same and found to be in atrial fibrillation/flutter with RVR. Is on metoprolol for the same. Took extra dose of metoprolol today. She is on anticoagulation. No coughing. No fevers. She has had some what decreased oral intake. No nausea vomiting diarrhea. No headache. No confusion. She states she cannot feel her heart go fast but does feel weaker.  Past Medical History  Diagnosis Date  . Hypothyroidism   . Hypertension   . Type 2 diabetes mellitus   . Palpitations     PAT/EAT/NSVT, normal LVEF  . Mitral regurgitation     Mild  . Anxiety   . Hyperlipidemia   . Depression    Past Surgical History  Procedure Laterality Date  . None    . 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 hiatal hernia. Antral erosions status post gastric biopsy.  [name] dilation N/A 04/17/2014    Procedure: [name] DILATION;  Surgeon: Corbin Ade, MD;  Location: [facility];  Service: Endoscopy;  Laterality: N/A;   Family History  Problem Relation Age of Onset  . Heart failure Mother     Died in her 57s  . Tuberculosis Father     Died at young age  . Cancer Brother   . Arthritis    . Colon cancer     History  Substance Use Topics  . Smoking status: Never Smoker   . Smokeless tobacco: Never Used  . Alcohol Use: No   OB History    Gravida Para Term Preterm AB TAB SAB Ectopic Multiple Living   Review of Systems  Constitutional: Positive for appetite change and fatigue.  Negative for activity change.  Eyes: Negative for pain.  Respiratory: Positive for shortness of breath. Negative for cough and chest tightness.   Cardiovascular: Positive for palpitations and leg swelling. Negative for chest pain.  Gastrointestinal: Negative for nausea, vomiting, abdominal pain and diarrhea.  Genitourinary: Negative for flank pain.  Musculoskeletal: Negative for back pain and neck stiffness.  Skin: Negative for rash.  Neurological: Negative for weakness, numbness and headaches.  Psychiatric/Behavioral: Negative for behavioral problems.      Allergies  Chocolate  Home Medications   Prior to Admission medications   Medication Sig Start Date End Date Taking? Authorizing Provider  albuterol (PROVENTIL HFA;VENTOLIN HFA) 108 (90 BASE) MCG/ACT inhaler Inhale 1 puff into the lungs every 6 (six) hours as needed for wheezing or shortness of breath.   Yes Historical Provider, MD  ALPRAZolam Prudy Feeler) 0.25 MG tablet Take 0.25 mg by mouth at bedtime as needed for anxiety or sleep.    Yes Historical Provider, MD  amLODipine (NORVASC) 10 MG tablet Take 10 mg by mouth daily.     Yes Historical Provider, MD  apixaban (ELIQUIS) 5 MG TABS tablet Take 1 tablet (5 mg total) by mouth 2 (two) times daily. 08/12/14  Yes Jonelle Sidle, MD  atorvastatin (LIPITOR) 40 MG tablet Take 40 mg by mouth daily at  6 PM. 09/27/11  Yes Jonelle SidleSamuel G McDowell, MD  cetirizine (ZYRTEC) 10 MG tablet Take 5-10 mg by mouth daily as needed for allergies or rhinitis.    Yes Historical Provider, MD  docusate sodium (COLACE) 100 MG capsule Take 100 mg by mouth at bedtime as needed for mild constipation.    Yes Historical Provider, MD  ferrous sulfate 325 (65 FE) MG tablet Take 325 mg by mouth daily with breakfast.   Yes Historical Provider, MD  furosemide (LASIX) 20 MG tablet Take 20 mg by mouth daily as needed for fluid or edema.    Yes Historical Provider, MD  glimepiride (AMARYL) 2 MG tablet Take 1 mg by mouth daily  before breakfast.    Yes Historical Provider, MD  levothyroxine (SYNTHROID, LEVOTHROID) 25 MCG tablet Take 25 mcg by mouth daily before breakfast.   Yes Historical Provider, MD  lisinopril (PRINIVIL,ZESTRIL) 5 MG tablet TAKE 1 TABLET BY MOUTH DAILY. 08/02/14  Yes Jonelle SidleSamuel G McDowell, MD  metoprolol (LOPRESSOR) 50 MG tablet Take 1 tablet (50 mg total) by mouth at bedtime. 07/15/14  Yes Rhetta MuraJai-Gurmukh Samtani, MD  metoprolol tartrate (LOPRESSOR) 25 MG tablet TAKE 3 TABLETS BY MOUTH EVERY MORNING. 08/02/14  Yes Jonelle SidleSamuel G McDowell, MD  quinapril (ACCUPRIL) 20 MG tablet Take 20 mg by mouth at bedtime.   Yes Historical Provider, MD  omeprazole (PRILOSEC) 20 MG capsule TAKE 1 CAPSULE BY MOUTH DAILY 30 MINUTES BEFORE BREAKFAST Patient not taking: Reported on 09/09/2014 07/03/14   Nira RetortAnna W Sams, NP  potassium chloride SA (K-DUR,KLOR-CON) 20 MEQ tablet Take 20 mEq by mouth 2 (two) times daily.    Historical Provider, MD   BP 110/63 mmHg  Pulse 140  Temp(Src) 98.7 F (37.1 C) (Oral)  Resp 17  Ht 5\' 6"  (1.676 m)  Wt 172 lb (78.019 kg)  BMI 27.77 kg/m2  SpO2 100% Physical Exam  Constitutional: She appears well-developed.  HENT:  Head: Normocephalic and atraumatic.  Eyes: Pupils are equal, round, and reactive to light.  Neck: Neck supple.  Cardiovascular:  Regular tachycardia  Pulmonary/Chest: Effort normal and breath sounds normal.  Abdominal: Soft.  Musculoskeletal: She exhibits edema.  Bilateral lower extremity pitting edema  Neurological: She is alert.  Skin: Skin is warm.    ED Course  Procedures (including critical care time) Labs Review Labs Reviewed  COMPREHENSIVE METABOLIC PANEL - Abnormal; Notable for the following:    Glucose, Bld 147 (*)    GFR calc non Af Amer 50 (*)    GFR calc Af Amer 58 (*)    All other components within normal limits  PROTIME-INR - Abnormal; Notable for the following:    Prothrombin Time 16.2 (*)    All other components within normal limits  CBC WITH  DIFFERENTIAL/PLATELET - Abnormal; Notable for the following:    Hemoglobin 11.7 (*)    All other components within normal limits  TROPONIN I - Abnormal; Notable for the following:    Troponin I 0.05 (*)    All other components within normal limits  BRAIN NATRIURETIC PEPTIDE - Abnormal; Notable for the following:    B Natriuretic Peptide 265.0 (*)    All other components within normal limits  TROPONIN I - Abnormal; Notable for the following:    Troponin I 0.06 (*)    All other components within normal limits  COMPREHENSIVE METABOLIC PANEL  CBC WITH DIFFERENTIAL/PLATELET  TSH  TROPONIN I  TROPONIN I  HEMOGLOBIN A1C    Imaging Review Dg Chest Portable 1  View  09/09/2014   CLINICAL DATA:  Tachycardia  EXAM: PORTABLE CHEST - 1 VIEW  COMPARISON:  07/13/2014 CT  FINDINGS: Heart size mildly enlarged. Scattered atherosclerosis of the aortic arch. Mild interstitial prominence. No overt effusion. No pneumothorax. Multilevel degenerative change.  IMPRESSION: Mild interstitial prominence can be seen in the setting of interstitial edema, atypical/viral infection, or pneumonitis.   Electronically Signed   By: Jearld Lesch M.D.   On: 09/09/2014 23:02     EKG Interpretation   Date/Time:  Monday September 09 2014 22:06:24 EDT Ventricular Rate:  143 PR Interval:    QRS Duration: 121 QT Interval:  335 QTC Calculation: 517 R Axis:   64 Text Interpretation:  Wide-QRS tachycardia Nonspecific intraventricular  conduction delay Baseline wander in lead(s) V2 V3 V5 Confirmed by  Anysha Frappier  MD, Bryce Kimble [id number]) on 09/09/2014 10:09:56 PM      MDM   Final diagnoses:  Atrial flutter with rapid ventricular response  LBBB (left bundle branch block)    Patient with apparent atrial flutter with RVR. History of same. Left bundle branch block is chronic. Patient then started on Cardizem drip. Second bolus given had mild hypotension. Discussed with cardiology and patient be admitted to internal  medicine. CRITICAL CARE Performed by: Billee Cashing Total critical care time: 30 Critical care time was exclusive of separately billable procedures and treating other patients. Critical care was necessary to treat or prevent imminent or life-threatening deterioration. Critical care was time spent personally by me on the following activities: development of treatment plan with patient and/or surrogate as well as nursing, discussions with consultants, evaluation of patient's response to treatment, examination of patient, obtaining history from patient or surrogate, ordering and performing treatments and interventions, ordering and review of laboratory studies, ordering and review of radiographic studies, pulse oximetry and re-evaluation of patient's condition.       Benjiman Core, MD 09/10/14 [phone]

## 2014-09-10 DIAGNOSIS — I447 Left bundle-branch block, unspecified: Secondary | ICD-10-CM

## 2014-09-10 LAB — GLUCOSE, CAPILLARY
GLUCOSE-CAPILLARY: 69 mg/dL — AB (ref 70–99)
GLUCOSE-CAPILLARY: 73 mg/dL (ref 70–99)
Glucose-Capillary: 112 mg/dL — ABNORMAL HIGH (ref 70–99)
Glucose-Capillary: 105 mg/dL — ABNORMAL HIGH (ref 70–99)
Glucose-Capillary: 175 mg/dL — ABNORMAL HIGH (ref 70–99)
Glucose-Capillary: 183 mg/dL — ABNORMAL HIGH (ref 70–99)
Glucose-Capillary: 227 mg/dL — ABNORMAL HIGH (ref 70–99)

## 2014-09-10 LAB — COMPREHENSIVE METABOLIC PANEL
ALT: 17 U/L (ref 0–35)
AST: 18 U/L (ref 0–37)
Albumin: 3.2 g/dL — ABNORMAL LOW (ref 3.5–5.2)
Alkaline Phosphatase: 76 U/L (ref 39–117)
Anion gap: 8 (ref 5–15)
BILIRUBIN TOTAL: 0.7 mg/dL (ref 0.3–1.2)
BUN: 15 mg/dL (ref 6–23)
CALCIUM: 8.7 mg/dL (ref 8.4–10.5)
CHLORIDE: 109 mmol/L (ref 96–112)
CO2: 23 mmol/L (ref 19–32)
Creatinine, Ser: 0.74 mg/dL (ref 0.50–1.10)
GFR, EST AFRICAN AMERICAN: 87 mL/min — AB (ref >90)
GFR, EST NON AFRICAN AMERICAN: 75 mL/min — AB (ref >90)
GLUCOSE: 108 mg/dL — AB (ref 70–99)
Potassium: 3.7 mmol/L (ref 3.5–5.1)
SODIUM: 140 mmol/L (ref 135–145)
Total Protein: 6.3 g/dL (ref 6.0–8.3)

## 2014-09-10 LAB — CBC WITH DIFFERENTIAL/PLATELET
Basophils Absolute: 0 10*3/uL (ref 0.0–0.1)
Basophils Relative: 0 % (ref 0–1)
Eosinophils Absolute: 0.1 10*3/uL (ref 0.0–0.7)
Eosinophils Relative: 1 % (ref 0–5)
HCT: 34 % — ABNORMAL LOW (ref 36.0–46.0)
Hemoglobin: 11.1 g/dL — ABNORMAL LOW (ref 12.0–15.0)
LYMPHS PCT: 44 % (ref 12–46)
Lymphs Abs: 2 10*3/uL (ref 0.7–4.0)
MCH: 28.5 pg (ref 26.0–34.0)
MCHC: 32.6 g/dL (ref 30.0–36.0)
MCV: 87.4 fL (ref 78.0–100.0)
Monocytes Absolute: 0.2 10*3/uL (ref 0.1–1.0)
Monocytes Relative: 5 % (ref 3–12)
NEUTROS ABS: 2.2 10*3/uL (ref 1.7–7.7)
Neutrophils Relative %: 49 % (ref 43–77)
PLATELETS: 208 10*3/uL (ref 150–400)
RBC: 3.89 MIL/uL (ref 3.87–5.11)
RDW: 14.6 % (ref 11.5–15.5)
WBC: 4.5 10*3/uL (ref 4.0–10.5)

## 2014-09-10 LAB — MRSA PCR SCREENING: MRSA BY PCR: NEGATIVE

## 2014-09-10 LAB — TSH: TSH: 2.12 u[IU]/mL (ref 0.350–4.500)

## 2014-09-10 LAB — BRAIN NATRIURETIC PEPTIDE: B NATRIURETIC PEPTIDE 5: 265 pg/mL — AB (ref 0.0–100.0)

## 2014-09-10 LAB — TROPONIN I
TROPONIN I: 0.09 ng/mL — AB (ref <0.031)
Troponin I: 0.06 ng/mL — ABNORMAL HIGH (ref <0.031)
Troponin I: 0.1 ng/mL — ABNORMAL HIGH (ref <0.031)

## 2014-09-10 MED ORDER — METOPROLOL TARTRATE 1 MG/ML IV SOLN
INTRAVENOUS | Status: AC
  Administered 2014-09-10: 5 mg
  Filled 2014-09-10: qty 5

## 2014-09-10 MED ORDER — LISINOPRIL 10 MG PO TABS
20.0000 mg | ORAL_TABLET | Freq: Every day | ORAL | Status: DC
  Administered 2014-09-10: 20 mg via ORAL
  Filled 2014-09-10: qty 2

## 2014-09-10 MED ORDER — DILTIAZEM HCL 100 MG IV SOLR
5.0000 mg/h | INTRAVENOUS | Status: DC
  Administered 2014-09-10: 15 mg/h via INTRAVENOUS

## 2014-09-10 NOTE — Progress Notes (Signed)
[facility] PROGRESS NOTE  ABBAGAYLE ZARAGOZA XBJ:[id number] DOB: 1927/04/23    PCP:   Milana Obey, MD   HPI: ROCKY RISHEL is an 81 y.o. female with hx of afib on Eliquis, admitted for afib with RVR.  Her HR has been in the 140, without chest pain or SOB.  She is currently at  per hour of IV cardiazem.    Rewiew of Systems:  Constitutional: Negative for malaise, fever and chills. No significant weight loss or weight gain Eyes: Negative for eye pain, redness and discharge, diplopia, visual changes, or flashes of light. ENMT: Negative for ear pain, hoarseness, nasal congestion, sinus pressure and sore throat. No headaches; tinnitus, drooling, or problem swallowing. Cardiovascular: Negative for chest pain, palpitations, diaphoresis, dyspnea and peripheral edema. ; No orthopnea, PND Respiratory: Negative for cough, hemoptysis, wheezing and stridor. No pleuritic chestpain. Gastrointestinal: Negative for nausea, vomiting, diarrhea, constipation, abdominal pain, melena, blood in stool, hematemesis, jaundice and rectal bleeding.    Genitourinary: Negative for frequency, dysuria, incontinence,flank pain and hematuria; Musculoskeletal: Negative for back pain and neck pain. Negative for swelling and trauma.;  Skin: . Negative for pruritus, rash, abrasions, bruising and skin lesion.; ulcerations Neuro: Negative for headache, lightheadedness and neck stiffness. Negative for weakness, altered level of consciousness , altered mental status, extremity weakness, burning feet, involuntary movement, seizure and syncope.  Psych: negative for anxiety, depression, insomnia, tearfulness, panic attacks, hallucinations, paranoia, suicidal or homicidal ideation    Past Medical History  Diagnosis Date  . Hypothyroidism   . Hypertension   . Type 2 diabetes mellitus   . Palpitations     PAT/EAT/NSVT, normal LVEF  . Mitral regurgitation     Mild  . Anxiety   . Hyperlipidemia   . Depression      Past Surgical History  Procedure Laterality Date  . None    . 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 hiatal hernia. Antral erosions status post gastric biopsy.  [name] dilation N/A 04/17/2014    Procedure: [name] DILATION;  Surgeon: Corbin Ade, MD;  Location: [facility];  Service: Endoscopy;  Laterality: N/A;    Medications:  HOME MEDS: Prior to Admission medications   Medication Sig Start Date End Date Taking? Authorizing Provider  albuterol (PROVENTIL HFA;VENTOLIN HFA) 108 (90 BASE) MCG/ACT inhaler Inhale 1 puff into the lungs every 6 (six) hours as needed for wheezing or shortness of breath.   Yes Historical Provider, MD  ALPRAZolam Prudy Feeler) 0.25 MG tablet Take 0.25 mg by mouth at bedtime as needed for anxiety or sleep.    Yes Historical Provider, MD  amLODipine (NORVASC) 10 MG tablet Take 10 mg by mouth daily.     Yes Historical Provider, MD  apixaban (ELIQUIS) 5 MG TABS tablet Take 1 tablet (5 mg total) by mouth 2 (two) times daily. 08/12/14  Yes Jonelle Sidle, MD  atorvastatin (LIPITOR) 40 MG tablet Take 40 mg by mouth daily at 6 PM. 09/27/11  Yes Jonelle Sidle, MD  cetirizine (ZYRTEC) 10 MG tablet Take 5-10 mg by mouth daily as needed for allergies or rhinitis.    Yes Historical Provider, MD  docusate sodium (COLACE) 100 MG capsule Take 100 mg by mouth at bedtime as needed for mild constipation.    Yes Historical Provider, MD  ferrous sulfate 325 (65 FE) MG tablet Take 325 mg by mouth daily with breakfast.   Yes Historical  Provider, MD  furosemide (LASIX) 20 MG tablet Take 20 mg by mouth daily as needed for fluid or edema.    Yes Historical Provider, MD  glimepiride (AMARYL) 2 MG tablet Take 1 mg by mouth daily before breakfast.    Yes Historical Provider, MD  levothyroxine (SYNTHROID, LEVOTHROID) 25 MCG tablet Take 25 mcg by mouth daily before breakfast.    Yes Historical Provider, MD  lisinopril (PRINIVIL,ZESTRIL) 5 MG tablet TAKE 1 TABLET BY MOUTH DAILY. 08/02/14  Yes Jonelle SidleSamuel G McDowell, MD  metoprolol (LOPRESSOR) 50 MG tablet Take 1 tablet (50 mg total) by mouth at bedtime. 07/15/14  Yes Rhetta MuraJai-Gurmukh Samtani, MD  metoprolol tartrate (LOPRESSOR) 25 MG tablet TAKE 3 TABLETS BY MOUTH EVERY MORNING. 08/02/14  Yes Jonelle SidleSamuel G McDowell, MD  quinapril (ACCUPRIL) 20 MG tablet Take 20 mg by mouth at bedtime.   Yes Historical Provider, MD  omeprazole (PRILOSEC) 20 MG capsule TAKE 1 CAPSULE BY MOUTH DAILY 30 MINUTES BEFORE BREAKFAST Patient not taking: Reported on 09/09/2014 07/03/14   Nira RetortAnna W Sams, NP  potassium chloride SA (K-DUR,KLOR-CON) 20 MEQ tablet Take 20 mEq by mouth 2 (two) times daily.    Historical Provider, MD     Allergies:  Allergies  Allergen Reactions  . Chocolate     Hives    Social History:   reports that she has never smoked. She has never used smokeless tobacco. She reports that she does not drink alcohol or use illicit drugs.  Family History: Family History  Problem Relation Age of Onset  . Heart failure Mother     Died in her 4970s  . Tuberculosis Father     Died at young age  . Cancer Brother   . Arthritis    . Colon cancer       Physical Exam: Filed Vitals:   09/10/14 0500 09/10/14 0600 09/10/14 0700 09/10/14 0745  BP: 116/47 104/61 121/96   Pulse: 59 59 62   Temp:    98.1 F (36.7 C)  TempSrc:    Axillary  Resp: 14 17 19    Height:      Weight:      SpO2: 100% 100% 100%    Blood pressure 121/96, pulse 62, temperature 98.1 F (36.7 C), temperature source Axillary, resp. rate 19, height 5\' 6"  (1.676 m), weight 165 lb 9.1 oz (75.1 kg), SpO2 100 %.  GEN:  Pleasant  patient lying in the stretcher in no acute distress; cooperative with exam. PSYCH:  alert and oriented x4; does not appear anxious or depressed; affect is appropriate. HEENT: Mucous membranes pink and anicteric; PERRLA; EOM intact; no cervical  lymphadenopathy nor thyromegaly or carotid bruit; no JVD; There were no stridor. Neck is very supple. Breasts:: Not examined CHEST WALL: No tenderness CHEST: Normal respiration, clear to auscultation bilaterally.  HEART: Irregular rate and rhythm.   There are no murmur, rub, or gallops.   BACK: No kyphosis or scoliosis; no CVA tenderness ABDOMEN: soft and non-tender; no masses, no organomegaly, normal abdominal bowel sounds; no pannus; no intertriginous candida. There is no rebound and no distention. Rectal Exam: Not done EXTREMITIES: No bone or joint deformity; age-appropriate arthropathy of the hands and knees; no edema; no ulcerations.  There is no calf tenderness. Genitalia: not examined PULSES: 2+ and symmetric SKIN: Normal hydration no rash or ulceration CNS: Cranial nerves 2-12 grossly intact no focal lateralizing neurologic deficit.  Speech is fluent; uvula elevated with phonation, facial symmetry and tongue midline. DTR are normal bilaterally, cerebella exam  is intact, barbinski is negative and strengths are equaled bilaterally.  No sensory loss.   Labs on Admission:  Basic Metabolic Panel:  Recent Labs Lab 09/09/14 2215 09/10/14 0534  NA 138 140  K 3.8 3.7  CL 107 109  CO2 22 23  GLUCOSE 147* 108*  BUN 19 15  CREATININE 1.00 0.74  CALCIUM 9.1 8.7   Liver Function Tests:  Recent Labs Lab 09/09/14 2215 09/10/14 0534  AST 24 18  ALT 20 17  ALKPHOS 88 76  BILITOT 0.7 0.7  PROT 7.2 6.3  ALBUMIN 3.6 3.2*   No results for input(s): LIPASE, AMYLASE in the last 168 hours. No results for input(s): AMMONIA in the last 168 hours. CBC:  Recent Labs Lab 09/09/14 2215 09/10/14 0534  WBC 5.5 4.5  NEUTROABS 3.2 2.2  HGB 11.7* 11.1*  HCT 36.2 34.0*  MCV 87.7 87.4  PLT 260 208   Cardiac Enzymes:  Recent Labs Lab 09/09/14 2215 09/10/14 0011 09/10/14 0534  TROPONINI 0.05* 0.06* 0.10*    CBG:  Recent Labs Lab 09/10/14 0333 09/10/14 0716  GLUCAP 112* 105*      Radiological Exams on Admission: Dg Chest Portable 1 View  09/09/2014   CLINICAL DATA:  Tachycardia  EXAM: PORTABLE CHEST - 1 VIEW  COMPARISON:  07/13/2014 CT  FINDINGS: Heart size mildly enlarged. Scattered atherosclerosis of the aortic arch. Mild interstitial prominence. No overt effusion. No pneumothorax. Multilevel degenerative change.  IMPRESSION: Mild interstitial prominence can be seen in the setting of interstitial edema, atypical/viral infection, or pneumonitis.   Electronically Signed   By: Jearld Lesch M.D.   On: 09/09/2014 23:02    EKG: Independently reviewed. Afib/flutter with LBBB, rapid ventricular rate.    Assessment/Plan Present on Admission:  . Atrial fibrillation with RVR HTN Chronic diastolic CHF NIDDM Hypothyroidism  PLAN:  Have used IV Lopressor with better rate control.  Will continue IV Cardiazem at this time.  Will need increase dose of Lopressor.  Continue with Eliquis.  Her TSH is in normal range.  She had no chest pain even at sustained HR of 140.  For her DM, will continue with meds and SSI.  Will keep her in SDU tonight.     Other plans as per orders.  Code Status: FULL Unk Lightning, MD. [facility] Pager [phone] 7pm to 7am.  09/10/2014, 10:39 AM

## 2014-09-10 NOTE — Care Management Note (Addendum)
    Page 1 of 1   09/11/2014     10:28:03 AM CARE MANAGEMENT NOTE 09/11/2014  Patient:  [name],[name]   Account Number:  [id number]  Date Initiated:  09/10/2014  Documentation initiated by:  Kathyrn SheriffHILDRESS,JESSICA  Subjective/Objective Assessment:   Pt is from home, lives with husband and has strong support from children. Pt uses a cane as needed and has a walker if needed. Pt care for sick husband. Pt's husband is active with [facility]. Pt's daughters request PD list, one provided.     Action/Plan:   Pt may need PT eval prior to discharge. Will cont to follow.   Anticipated DC Date:  09/13/2014   Anticipated DC Plan:  HOME/SELF CARE      DC Planning Services  CM consult      Choice offered to / List presented to:             Status of service:  Completed, signed off Medicare Important Message given?   (If response is "NO", the following Medicare IM given date fields will be blank) Date Medicare IM given:   Medicare IM given by:   Date Additional Medicare IM given:   Additional Medicare IM given by:    Discharge Disposition:  HOME/SELF CARE  Per UR Regulation:  Reviewed for med. necessity/level of care/duration of stay  If discussed at Long Length of Stay Meetings, dates discussed:    Comments:  09/11/2014 1030 Kathyrn SheriffJessica Childress, RN, MSN, CM Per MD, anticiapte discharge home today. No CM needs. PD list given to pt's daughters. 09/10/2014 1400 Kathyrn SheriffJessica Childress, RN, MSN, CM

## 2014-09-10 NOTE — Plan of Care (Signed)
Problem: Phase I Progression Outcomes Goal: Ventricular heart rate < 120/min Outcome: Progressing Decreasing while Cardizem drip is being titrated    Goal: Anticoagulation Therapy per MD order Outcome: Completed/Met Date Met:  09/10/14 Taking Eliquis currently at home and restarted in the hospital Goal: Heart rate or rhythm control medication Outcome: Progressing Beta blocker and ACE inhibitor ordered Goal: Pain controlled with appropriate interventions Outcome: Completed/Met Date Met:  09/10/14 Denies having any pain or discomfort Goal: Initial discharge plan identified Outcome: Completed/Met Date Met:  09/10/14 Home with family     

## 2014-09-10 NOTE — Care Management Utilization Note (Signed)
UR completed 

## 2014-09-11 DIAGNOSIS — I4892 Unspecified atrial flutter: Secondary | ICD-10-CM | POA: Insufficient documentation

## 2014-09-11 DIAGNOSIS — I10 Essential (primary) hypertension: Secondary | ICD-10-CM

## 2014-09-11 DIAGNOSIS — E039 Hypothyroidism, unspecified: Secondary | ICD-10-CM

## 2014-09-11 DIAGNOSIS — I4891 Unspecified atrial fibrillation: Principal | ICD-10-CM

## 2014-09-11 DIAGNOSIS — E1139 Type 2 diabetes mellitus with other diabetic ophthalmic complication: Secondary | ICD-10-CM

## 2014-09-11 LAB — GLUCOSE, CAPILLARY
Glucose-Capillary: 119 mg/dL — ABNORMAL HIGH (ref 70–99)
Glucose-Capillary: 90 mg/dL (ref 70–99)
Glucose-Capillary: 132 mg/dL — ABNORMAL HIGH (ref 70–99)

## 2014-09-11 LAB — CBC WITH DIFFERENTIAL/PLATELET
Basophils Absolute: 0 10*3/uL (ref 0.0–0.1)
Basophils Relative: 1 % (ref 0–1)
Eosinophils Absolute: 0.1 10*3/uL (ref 0.0–0.7)
Eosinophils Relative: 2 % (ref 0–5)
HCT: 31.7 % — ABNORMAL LOW (ref 36.0–46.0)
Hemoglobin: 10.4 g/dL — ABNORMAL LOW (ref 12.0–15.0)
Lymphocytes Relative: 31 % (ref 12–46)
Lymphs Abs: 1.6 10*3/uL (ref 0.7–4.0)
MCH: 28.9 pg (ref 26.0–34.0)
MCHC: 32.8 g/dL (ref 30.0–36.0)
MCV: 88.1 fL (ref 78.0–100.0)
Monocytes Absolute: 0.4 10*3/uL (ref 0.1–1.0)
Monocytes Relative: 8 % (ref 3–12)
Neutro Abs: 3.1 10*3/uL (ref 1.7–7.7)
Neutrophils Relative %: 58 % (ref 43–77)
Platelets: 210 10*3/uL (ref 150–400)
RBC: 3.6 MIL/uL — ABNORMAL LOW (ref 3.87–5.11)
RDW: 14.5 % (ref 11.5–15.5)
WBC: 5.2 10*3/uL (ref 4.0–10.5)

## 2014-09-11 LAB — COMPREHENSIVE METABOLIC PANEL
ALT: 15 U/L (ref 0–35)
ANION GAP: 7 (ref 5–15)
AST: 16 U/L (ref 0–37)
Albumin: 3 g/dL — ABNORMAL LOW (ref 3.5–5.2)
Alkaline Phosphatase: 71 U/L (ref 39–117)
BILIRUBIN TOTAL: 0.5 mg/dL (ref 0.3–1.2)
BUN: 13 mg/dL (ref 6–23)
CO2: 23 mmol/L (ref 19–32)
CREATININE: 0.68 mg/dL (ref 0.50–1.10)
Calcium: 8.5 mg/dL (ref 8.4–10.5)
Chloride: 107 mmol/L (ref 96–112)
GFR calc Af Amer: 90 mL/min — ABNORMAL LOW (ref >90)
GFR, EST NON AFRICAN AMERICAN: 78 mL/min — AB (ref >90)
Glucose, Bld: 121 mg/dL — ABNORMAL HIGH (ref 70–99)
Potassium: 3.9 mmol/L (ref 3.5–5.1)
Sodium: 137 mmol/L (ref 135–145)
Total Protein: 6 g/dL (ref 6.0–8.3)

## 2014-09-11 LAB — HEMOGLOBIN A1C
Hgb A1c MFr Bld: 6.1 % — ABNORMAL HIGH (ref 4.8–5.6)
MEAN PLASMA GLUCOSE: 128 mg/dL

## 2014-09-11 MED ORDER — LISINOPRIL 5 MG PO TABS: 5.0000 mg | ORAL_TABLET | Freq: Every day | ORAL | Status: DC

## 2014-09-11 MED ORDER — METOPROLOL TARTRATE 50 MG PO TABS
75.0000 mg | ORAL_TABLET | Freq: Every morning | ORAL | Status: DC
  Administered 2014-09-11: 75 mg via ORAL
  Filled 2014-09-11 (×2): qty 1

## 2014-09-11 MED ORDER — METOPROLOL TARTRATE 50 MG PO TABS: 50.0000 mg | ORAL_TABLET | Freq: Every day | ORAL | Status: DC

## 2014-09-11 MED ORDER — POTASSIUM CHLORIDE CRYS ER 20 MEQ PO TBCR
20.0000 meq | EXTENDED_RELEASE_TABLET | Freq: Every day | ORAL | Status: DC
  Administered 2014-09-11: 20 meq via ORAL
  Filled 2014-09-11: qty 1

## 2014-09-11 MED ORDER — FUROSEMIDE 20 MG PO TABS
20.0000 mg | ORAL_TABLET | Freq: Every day | ORAL | Status: DC
  Administered 2014-09-11: 20 mg via ORAL
  Filled 2014-09-11: qty 1

## 2014-09-11 MED ORDER — LISINOPRIL 10 MG PO TABS: 10.0000 mg | ORAL_TABLET | Freq: Every day | ORAL | Status: DC

## 2014-09-11 MED ORDER — METOPROLOL TARTRATE 50 MG PO TABS: 100.0000 mg | ORAL_TABLET | Freq: Every morning | ORAL | Status: DC

## 2014-09-11 MED ORDER — METOPROLOL TARTRATE 100 MG PO TABS: 100.0000 mg | ORAL_TABLET | Freq: Every morning | ORAL | Status: DC

## 2014-09-11 MED ORDER — FUROSEMIDE 20 MG PO TABS: 20.0000 mg | ORAL_TABLET | Freq: Every day | ORAL | Status: DC

## 2014-09-11 NOTE — Consult Note (Signed)
Referring Physician: PTH  Primary Physician: Robert Bellow, MD Consulting Cardiologist: Carlyle Dolly MD Primary Cardiologist: Rozann Lesches MD [facility]) Reason for Consultation: Atrial fibrillation with RVR   [name] is an 81 y.o. female.  HPI: This is a 81 year old female patient of Dr. Domenic Polite who was admitted in February 2016 with narrow complex regular tachycardia at 140 bpm that appeared to be MAT with wandering pacemaker telemetry shortly after showed atrial flutter with flutter waves. 2-D echo LVEF 50-55%, restrictive diastolic function, moderate MR, moderate TR. Rate was controlled with Lopressor. Chadsvas score was 5 and she was started on Eliquis.  Patient noticed her heart rate was 140. She took her BP and it was normal. She had some dyspnea with it, no chest pain, dizziness or presyncope. Troponins elevated at 0.06, 0.05, 0.09. She has been stressed taking care of her husband who is 8 and just had surgery for penile cancer and they just moved to a condo. She hasn't missed any meds. She has now converted to NSR with IV Cardizem. She feels better and wants to go home. She also has chronic LBBB, mild MR, DM2, HTN, HL.  Past Medical History  Diagnosis Date  . Hypothyroidism   . Hypertension   . Type 2 diabetes mellitus   . Palpitations     PAT/EAT/NSVT, normal LVEF  . Mitral regurgitation     Mild  . Anxiety   . Hyperlipidemia   . Depression     Past Surgical History  Procedure Laterality Date  . None    . 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 hiatal hernia. Antral erosions status post gastric biopsy.  Venia Minks dilation N/A 04/17/2014    Procedure: Venia Minks DILATION;  Surgeon: Daneil Dolin, MD;  Location: [facility];  Service: Endoscopy;  Laterality: N/A;    Family History  Problem Relation Age of Onset  . Heart failure Mother      Died in her 64s  . Tuberculosis Father     Died at young age  . Cancer Brother   . Arthritis    . Colon cancer      Social History:  reports that she has never smoked. She has never used smokeless tobacco. She reports that she does not drink alcohol or use illicit drugs.  Allergies:  Allergies  Allergen Reactions  . Chocolate     Hives    Medications: Scheduled Meds: . apixaban  5 mg Oral BID  . insulin aspart  0-9 Units Subcutaneous 6 times per day  . levothyroxine  25 mcg Oral QAC breakfast  . lisinopril  20 mg Oral QHS  . metoprolol tartrate  50 mg Oral BID  . sodium chloride  3 mL Intravenous Q12H  . sodium chloride  3 mL Intravenous Q12H   Continuous Infusions: . diltiazem (CARDIZEM) infusion Stopped (09/10/14 0156)  . diltiazem (CARDIZEM) infusion Stopped (09/10/14 1314)   PRN Meds:.sodium chloride, sodium chloride   Results for orders placed or performed during the hospital encounter of 09/09/14 (from the past 48 hour(s))  TSH     Status: None   Collection Time: 09/09/14 10:00 PM  Result Value Ref Range   TSH 2.120 0.350 - 4.500 uIU/mL  Hemoglobin A1c     Status: Abnormal   Collection Time: 09/09/14 10:00 PM  Result Value Ref Range   Hgb A1c MFr Bld 6.1 (H) 4.8 - 5.6 %  Comment: (NOTE)         Pre-diabetes: 5.7 - 6.4         Diabetes: >6.4         Glycemic control for adults with diabetes: <7.0    Mean Plasma Glucose 128 mg/dL    Comment: (NOTE) Performed At: [facility] [place], [place] [id number] Lindon Romp MD BJ:[id number]   Comprehensive metabolic panel     Status: Abnormal   Collection Time: 09/09/14 10:15 PM  Result Value Ref Range   Sodium 138 135 - 145 mmol/L   Potassium 3.8 3.5 - 5.1 mmol/L   Chloride 107 96 - 112 mmol/L   CO2 22 19 - 32 mmol/L   Glucose, Bld 147 (H) 70 - 99 mg/dL   BUN 19 6 - 23 mg/dL   Creatinine, Ser 1.00 0.50 - 1.10 mg/dL   Calcium 9.1 8.4 - 10.5 mg/dL   Total Protein 7.2 6.0 - 8.3  g/dL   Albumin 3.6 3.5 - 5.2 g/dL   AST 24 0 - 37 U/L   ALT 20 0 - 35 U/L   Alkaline Phosphatase 88 39 - 117 U/L   Total Bilirubin 0.7 0.3 - 1.2 mg/dL   GFR calc non Af Amer 50 (L) >90 mL/min   GFR calc Af Amer 58 (L) >90 mL/min    Comment: (NOTE) The eGFR has been calculated using the CKD EPI equation. This calculation has not been validated in all clinical situations. eGFR's persistently <90 mL/min signify possible Chronic Kidney Disease.    Anion gap 9 5 - 15  Protime-INR     Status: Abnormal   Collection Time: 09/09/14 10:15 PM  Result Value Ref Range   Prothrombin Time 16.2 (H) 11.6 - 15.2 seconds   INR 1.29 0.00 - 1.49  CBC with Differential     Status: Abnormal   Collection Time: 09/09/14 10:15 PM  Result Value Ref Range   WBC 5.5 4.0 - 10.5 K/uL   RBC 4.13 3.87 - 5.11 MIL/uL   Hemoglobin 11.7 (L) 12.0 - 15.0 g/dL   HCT 36.2 36.0 - 46.0 %   MCV 87.7 78.0 - 100.0 fL   MCH 28.3 26.0 - 34.0 pg   MCHC 32.3 30.0 - 36.0 g/dL   RDW 14.5 11.5 - 15.5 %   Platelets 260 150 - 400 K/uL   Neutrophils Relative % 58 43 - 77 %   Neutro Abs 3.2 1.7 - 7.7 K/uL   Lymphocytes Relative 34 12 - 46 %   Lymphs Abs 1.9 0.7 - 4.0 K/uL   Monocytes Relative 6 3 - 12 %   Monocytes Absolute 0.3 0.1 - 1.0 K/uL   Eosinophils Relative 1 0 - 5 %   Eosinophils Absolute 0.1 0.0 - 0.7 K/uL   Basophils Relative 1 0 - 1 %   Basophils Absolute 0.0 0.0 - 0.1 K/uL  Troponin I     Status: Abnormal   Collection Time: 09/09/14 10:15 PM  Result Value Ref Range   Troponin I 0.05 (H) <0.031 ng/mL    Comment:        PERSISTENTLY INCREASED TROPONIN VALUES IN THE RANGE OF 0.04-0.49 ng/mL CAN BE SEEN IN:       -UNSTABLE ANGINA       -CONGESTIVE HEART FAILURE       -MYOCARDITIS       -CHEST TRAUMA       -ARRYHTHMIAS       -LATE PRESENTING MYOCARDIAL INFARCTION       -  COPD   CLINICAL FOLLOW-UP RECOMMENDED.   Brain natriuretic peptide     Status: Abnormal   Collection Time: 09/10/14 12:11 AM  Result  Value Ref Range   B Natriuretic Peptide 265.0 (H) 0.0 - 100.0 pg/mL  Troponin I     Status: Abnormal   Collection Time: 09/10/14 12:11 AM  Result Value Ref Range   Troponin I 0.06 (H) <0.031 ng/mL    Comment:        PERSISTENTLY INCREASED TROPONIN VALUES IN THE RANGE OF 0.04-0.49 ng/mL CAN BE SEEN IN:       -UNSTABLE ANGINA       -CONGESTIVE HEART FAILURE       -MYOCARDITIS       -CHEST TRAUMA       -ARRYHTHMIAS       -LATE PRESENTING MYOCARDIAL INFARCTION       -COPD   CLINICAL FOLLOW-UP RECOMMENDED.   MRSA PCR Screening     Status: None   Collection Time: 09/10/14  2:00 AM  Result Value Ref Range   MRSA by PCR NEGATIVE NEGATIVE    Comment:        The GeneXpert MRSA Assay (FDA approved for NASAL specimens only), is one component of a comprehensive MRSA colonization surveillance program. It is not intended to diagnose MRSA infection nor to guide or monitor treatment for MRSA infections.   Glucose, capillary     Status: Abnormal   Collection Time: 09/10/14  3:33 AM  Result Value Ref Range   Glucose-Capillary 112 (H) 70 - 99 mg/dL  Comprehensive metabolic panel     Status: Abnormal   Collection Time: 09/10/14  5:34 AM  Result Value Ref Range   Sodium 140 135 - 145 mmol/L   Potassium 3.7 3.5 - 5.1 mmol/L   Chloride 109 96 - 112 mmol/L   CO2 23 19 - 32 mmol/L   Glucose, Bld 108 (H) 70 - 99 mg/dL   BUN 15 6 - 23 mg/dL   Creatinine, Ser 0.74 0.50 - 1.10 mg/dL   Calcium 8.7 8.4 - 10.5 mg/dL   Total Protein 6.3 6.0 - 8.3 g/dL   Albumin 3.2 (L) 3.5 - 5.2 g/dL   AST 18 0 - 37 U/L   ALT 17 0 - 35 U/L   Alkaline Phosphatase 76 39 - 117 U/L   Total Bilirubin 0.7 0.3 - 1.2 mg/dL   GFR calc non Af Amer 75 (L) >90 mL/min   GFR calc Af Amer 87 (L) >90 mL/min    Comment: (NOTE) The eGFR has been calculated using the CKD EPI equation. This calculation has not been validated in all clinical situations. eGFR's persistently <90 mL/min signify possible Chronic Kidney Disease.     Anion gap 8 5 - 15  CBC WITH DIFFERENTIAL     Status: Abnormal   Collection Time: 09/10/14  5:34 AM  Result Value Ref Range   WBC 4.5 4.0 - 10.5 K/uL   RBC 3.89 3.87 - 5.11 MIL/uL   Hemoglobin 11.1 (L) 12.0 - 15.0 g/dL   HCT 34.0 (L) 36.0 - 46.0 %   MCV 87.4 78.0 - 100.0 fL   MCH 28.5 26.0 - 34.0 pg   MCHC 32.6 30.0 - 36.0 g/dL   RDW 14.6 11.5 - 15.5 %   Platelets 208 150 - 400 K/uL   Neutrophils Relative % 49 43 - 77 %   Neutro Abs 2.2 1.7 - 7.7 K/uL   Lymphocytes Relative 44 12 - 46 %  Lymphs Abs 2.0 0.7 - 4.0 K/uL   Monocytes Relative 5 3 - 12 %   Monocytes Absolute 0.2 0.1 - 1.0 K/uL   Eosinophils Relative 1 0 - 5 %   Eosinophils Absolute 0.1 0.0 - 0.7 K/uL   Basophils Relative 0 0 - 1 %   Basophils Absolute 0.0 0.0 - 0.1 K/uL  Troponin I     Status: Abnormal   Collection Time: 09/10/14  5:34 AM  Result Value Ref Range   Troponin I 0.10 (H) <0.031 ng/mL    Comment:        PERSISTENTLY INCREASED TROPONIN VALUES IN THE RANGE OF 0.04-0.49 ng/mL CAN BE SEEN IN:       -UNSTABLE ANGINA       -CONGESTIVE HEART FAILURE       -MYOCARDITIS       -CHEST TRAUMA       -ARRYHTHMIAS       -LATE PRESENTING MYOCARDIAL INFARCTION       -COPD   CLINICAL FOLLOW-UP RECOMMENDED.   Glucose, capillary     Status: Abnormal   Collection Time: 09/10/14  7:16 AM  Result Value Ref Range   Glucose-Capillary 105 (H) 70 - 99 mg/dL   Comment 1 Notify RN    Comment 2 Document in Chart   Glucose, capillary     Status: Abnormal   Collection Time: 09/10/14 11:19 AM  Result Value Ref Range   Glucose-Capillary 175 (H) 70 - 99 mg/dL   Comment 1 Notify RN    Comment 2 Document in Chart   Troponin I     Status: Abnormal   Collection Time: 09/10/14 12:00 PM  Result Value Ref Range   Troponin I 0.09 (H) <0.031 ng/mL    Comment:        PERSISTENTLY INCREASED TROPONIN VALUES IN THE RANGE OF 0.04-0.49 ng/mL CAN BE SEEN IN:       -UNSTABLE ANGINA       -CONGESTIVE HEART FAILURE        -MYOCARDITIS       -CHEST TRAUMA       -ARRYHTHMIAS       -LATE PRESENTING MYOCARDIAL INFARCTION       -COPD   CLINICAL FOLLOW-UP RECOMMENDED.   Glucose, capillary     Status: Abnormal   Collection Time: 09/10/14  4:29 PM  Result Value Ref Range   Glucose-Capillary 69 (L) 70 - 99 mg/dL   Comment 1 Notify RN    Comment 2 Document in Chart   Glucose, capillary     Status: Abnormal   Collection Time: 09/10/14  6:23 PM  Result Value Ref Range   Glucose-Capillary 183 (H) 70 - 99 mg/dL   Comment 1 Notify RN    Comment 2 Document in Chart   Glucose, capillary     Status: Abnormal   Collection Time: 09/10/14  8:08 PM  Result Value Ref Range   Glucose-Capillary 227 (H) 70 - 99 mg/dL   Comment 1 Notify RN   Glucose, capillary     Status: None   Collection Time: 09/10/14 11:45 PM  Result Value Ref Range   Glucose-Capillary 73 70 - 99 mg/dL   Comment 1 Notify RN   Glucose, capillary     Status: Abnormal   Collection Time: 09/11/14  3:50 AM  Result Value Ref Range   Glucose-Capillary 119 (H) 70 - 99 mg/dL  Comprehensive metabolic panel     Status: Abnormal   Collection Time: 09/11/14  4:41  AM  Result Value Ref Range   Sodium 137 135 - 145 mmol/L   Potassium 3.9 3.5 - 5.1 mmol/L   Chloride 107 96 - 112 mmol/L   CO2 23 19 - 32 mmol/L   Glucose, Bld 121 (H) 70 - 99 mg/dL   BUN 13 6 - 23 mg/dL   Creatinine, Ser 0.68 0.50 - 1.10 mg/dL   Calcium 8.5 8.4 - 10.5 mg/dL   Total Protein 6.0 6.0 - 8.3 g/dL   Albumin 3.0 (L) 3.5 - 5.2 g/dL   AST 16 0 - 37 U/L   ALT 15 0 - 35 U/L   Alkaline Phosphatase 71 39 - 117 U/L   Total Bilirubin 0.5 0.3 - 1.2 mg/dL   GFR calc non Af Amer 78 (L) >90 mL/min   GFR calc Af Amer 90 (L) >90 mL/min    Comment: (NOTE) The eGFR has been calculated using the CKD EPI equation. This calculation has not been validated in all clinical situations. eGFR's persistently <90 mL/min signify possible Chronic Kidney Disease.    Anion gap 7 5 - 15  CBC WITH  DIFFERENTIAL     Status: Abnormal   Collection Time: 09/11/14  4:41 AM  Result Value Ref Range   WBC 5.2 4.0 - 10.5 K/uL   RBC 3.60 (L) 3.87 - 5.11 MIL/uL   Hemoglobin 10.4 (L) 12.0 - 15.0 g/dL   HCT 31.7 (L) 36.0 - 46.0 %   MCV 88.1 78.0 - 100.0 fL   MCH 28.9 26.0 - 34.0 pg   MCHC 32.8 30.0 - 36.0 g/dL   RDW 14.5 11.5 - 15.5 %   Platelets 210 150 - 400 K/uL   Neutrophils Relative % 58 43 - 77 %   Neutro Abs 3.1 1.7 - 7.7 K/uL   Lymphocytes Relative 31 12 - 46 %   Lymphs Abs 1.6 0.7 - 4.0 K/uL   Monocytes Relative 8 3 - 12 %   Monocytes Absolute 0.4 0.1 - 1.0 K/uL   Eosinophils Relative 2 0 - 5 %   Eosinophils Absolute 0.1 0.0 - 0.7 K/uL   Basophils Relative 1 0 - 1 %   Basophils Absolute 0.0 0.0 - 0.1 K/uL  Glucose, capillary     Status: None   Collection Time: 09/11/14  7:17 AM  Result Value Ref Range   Glucose-Capillary 90 70 - 99 mg/dL   Comment 1 Notify RN     Dg Chest Portable 1 View  09/09/2014   CLINICAL DATA:  Tachycardia  EXAM: PORTABLE CHEST - 1 VIEW  COMPARISON:  07/13/2014 CT  FINDINGS: Heart size mildly enlarged. Scattered atherosclerosis of the aortic arch. Mild interstitial prominence. No overt effusion. No pneumothorax. Multilevel degenerative change.  IMPRESSION: Mild interstitial prominence can be seen in the setting of interstitial edema, atypical/viral infection, or pneumonitis.   Electronically Signed   By: Carlos Levering M.D.   On: 09/09/2014 23:02   EKG atrial flutter with RVR and left bundle Ainhoa Rallo block  ROS  See HPI Eyes: Negative Ears:positive for hearing loss, no tinnitus Cardiovascular: Negative for chest pain,near-syncope, orthopnea, paroxysmal nocturnal dyspnea and syncope,edema, claudication, cyanosis,.  Respiratory:   Negative for cough, hemoptysis,  sleep disturbances due to breathing, sputum production and wheezing.   Endocrine: Negative for cold intolerance and heat intolerance.  Hematologic/Lymphatic: Negative for adenopathy and  bleeding problem. Does not bruise/bleed easily.  Musculoskeletal: Negative.   Gastrointestinal: Negative for nausea, vomiting, reflux, abdominal pain, diarrhea, constipation.   Genitourinary: Negative for  bladder incontinence, dysuria, flank pain, frequency, hematuria, hesitancy, nocturia and urgency.  Neurological: Negative.  Allergic/Immunologic: Negative for environmental allergies.  Blood pressure 91/58, pulse 58, temperature 97.7 F (36.5 C), temperature source Oral, resp. rate 17, height _0  (1.676 m), weight 165 lb 9.1 oz (75.1 kg), SpO2 100 %. Physical Exam PHYSICAL EXAM: Well-nournished, in no acute distress. Neck: No JVD, HJR, Bruit, or thyroid enlargement Lungs: Decreased breath sounds with a few crackles at bases. Cardiovascular: RRR, PMI not [id number],7/4 systolic murmur apex, no gallops, bruit, thrill, or heave. Abdomen: BS normal. Soft without organomegaly, masses, lesions or tenderness. Extremities: without cyanosis, clubbing or edema. Good distal pulses bilateral SKin: Warm, no lesions or rashes  Musculoskeletal: No deformities Neuro: no focal signs  1. Echocardiogram: 06/13/2014 Left ventricle: The cavity size was normal. Systolic function was   normal. The estimated ejection fraction was in the range of 50%   to 55%. Wall motion was normal; there were no regional wall   motion abnormalities. There was a reduced contribution of atrial   contraction to ventricular filling, due to increased ventricular   diastolic pressure or atrial contractile dysfunction. Doppler   parameters are consistent with a reversible restrictive pattern,   indicative of decreased left ventricular diastolic compliance   and/or increased left atrial pressure (grade 3 diastolic   dysfunction). - Ventricular septum: Septal motion showed paradox. These changes   are consistent with intraventricular conduction delay. - Aortic valve: Trileaflet; normal thickness, mildly calcified   leaflets. -  Mitral valve: There was moderate regurgitation. - Left atrium: The atrium was mildly to moderately dilated. - Tricuspid valve: There was moderate regurgitation. - Pulmonary arteries: PA peak pressure: 34 mm Hg (S).  NM Stress Test 2010 Recent pharmacologic perfusion imaging study was normal (per office note)     Assessment/Plan:  Atrial fib with RVR now converted to NSR with IV Cardizem. Was supposed to be on  Lopressor 75 mg in am 50 mg pm at home, but she states she takes 3 tablets in am and 1 pm-isn't sure of dose. CHADSVASC=5- on Eliquis-continue. Only on Lopressor 12m BID here- will increase to 75 mg am 50 mg pm. Can probably go home today with early f/u with Dr. MDomenic Polite  Elevated Troponin: probably demand ischemia- no chest pain  Chronic diastolic CHF-slight increase BNP  265 and crackles on exam. Takes Lasix 233mat home-resume. See echo above.  HTN  Chronic LBBB  DM2  Chronic renal insufficiency-creatnine stable 0.68     MiErmalinda Barrios/20/2016, 8:20 AM   Patient seen and discussed with PA LeBonnell PublicI agree with her documentation above. [age]o female hx of HTN, DM2, afib/aflutter, HL, hypothyroidism, chronic LBBB,  admitted with SOB and tachycardia. In ER in afib with RVR rates 140s, started on dilt gtt. Converted to NSR on dilt, now on lopressor 7536mn AM and 53m61m PM.    06/2014 echo: LVEF 50-55%, no WMAs, grade III diastolic dysfunction, mod MR TSH 2.1, Hgb 11.7, Plt 260, K 3.8, Cr 1, BNP 265, trop 0.1 peak trending down CXR mild interstitial prominence EKG afib with LBBB  Mild trop elevation due to demand ischemia in setting of tachycardia. Afib has converted to NSR, now back on orals.  Continue lopressor for rate control. Some noted soft bp's at times, will decrease her lisinopril to 10mg69mly. Continue eliquis for stroke prevention. She has her medication box with her. She is on both quinapril and lisinopril, we will stop her quinapril. She  take lopressor 75  mg ( 3 x 71m tables) and 524m([id number]) at night. Tele shows some night time heart rates low 50s, will keep night dose at 50 and increase AM dose to 100m43mily. Patient ok for discharge today, we will arrange f/u in 1 week with NP LawrPurcell NailsReidViolan she may resume regular follow up with Dr McDoDomenic PoliteEdenSpringfield J BrZandra Abts

## 2014-09-11 NOTE — Discharge Summary (Signed)
Physician Discharge Summary  [name] ZOX:[id number] DOB: 1926/12/15 DOA: 09/09/2014  PCP: Milana Obey, MD  Admit date: 09/09/2014 Discharge date: 09/11/2014  Time spent: 45 minutes  Recommendations for Outpatient Follow-up:  Patient will be discharged to home.  Patient will need to follow up with primary care provider within one week of discharge as well as follow up with Ms. Lyman Bishop, NP (cardiology) within 1 week.  Patient should continue medications as prescribed.  Patient should follow a heart healthy/carb modified diet.   Discharge Diagnoses:  Atrial fibrillation with RVR Elevated troponin Chronic diastolic heart failure Essential hypertension Diabetes mellitus, type II Hypothyroidism  Discharge Condition: Stable  Diet recommendation: Heart healthy/carb modified  Filed Weights   09/09/14 2144 09/10/14 0230  Weight: 78.019 kg (172 lb) 75.1 kg (165 lb 9.1 oz)    History of present illness:  On 09/09/2014 by Dr. Doree Albee This is a 81 y.o. year old female with significant past medical history of atrial fibrillation/atrial flutter on eliquuis, diastolic CHF, NIDDM, hypothyroidism, HTN presenting with atrial fibrillation w/ RVR, elevated troponin. Family reports pt w/ elevated HR at home over the past 2 days. Pt has not had any significant complaints apart from dyspnea. Pt noted to have been admitted 06/2014 for new onset atrial fibrillation/atrial flutter. D/c'd on BB and eliquuis. Pt/family report compliance w/ medication. Synthroid dose stable. Family does report daily caffeine/coffee intake. Unsure if pt has had any worsening LE swelling. Deny any excess salt intake. No CP.  Presented to ER T 98.7, HR 140s, resp 10s-20s, BP 100s-110s, satting 100% on RA. CBC and CMET grossly WNL. EKG w/ wide complex tachycardia in setting of pre-existing LBB from previous EKGs. Trop 0.05.  EDP [place] pending call back from cardiology at [facility] Course:    Atrial fibrillation with RVR -Currently in sinus rhythm -Cardiology consultation appreciated -Patient did require Cardizem drip however this was discontinued and patient was transitioned to Lopressor -Patient was on Lopressor at home however unsure of the actual medication dosages -CHADSVASC 6 -Continue Eliquis -Patient will need to follow up with cardiology within 1 week of discharge  Elevated Troponin -Likely secondary to demand ischemia -peaked at 1.0 -Cardiology consulted and appreciated  Chronic disstolic CHF -Echocardiogram 06/2014: EF 50-55%, grade 2 diastolic dysfunction -BNP 265 -Continue home lasix dose  -Continue BB, ACE inhibitor  Essential Hypertension -Has been soft -Continue Lopressor 100 mg qAM and  qPM, lisinopril 10 mg daily  Diabetes mellitus, type II -Hemoglobin A1c 6.1  Hypothyroidism -TSH 2.12 -Continue synthroid  Chronic normocytic anemia -Stable, Baseline Hb 11  Procedures: None  Consultations: Cardiology  Discharge Exam: Filed Vitals:   09/11/14 1200  BP: 125/66  Pulse: 65  Temp:   Resp: 16     General: Well developed, well nourished, NAD, appears stated age  HEENT: NCAT, mucous membranes moist.  Cardiovascular: S1 S2 auscultated, 2/6 SEM. Regular rate and rhythm.  Respiratory: Clear to auscultation bilaterally with equal chest rise  Abdomen: Soft, nontender, nondistended, + bowel sounds  Extremities: warm dry without cyanosis clubbing or edema  Neuro: AAOx3, nonfocal  Psych: Normal affect and demeanor   Discharge Instructions      Discharge Instructions    Discharge instructions    Complete by:  As directed   Patient will be discharged to home.  Patient will need to follow up with primary care provider within one week of discharge as well as follow up with Ms. Lyman Bishop, NP (cardiology) within 1 week.  Patient should continue medications as prescribed.  Patient should follow a heart healthy/carb modified diet.             Medication List    STOP taking these medications        amLODipine 10 MG tablet  Commonly known as:  NORVASC     omeprazole 20 MG capsule  Commonly known as:  PRILOSEC     quinapril 20 MG tablet  Commonly known as:  ACCUPRIL      TAKE these medications        albuterol 108 (90 BASE) MCG/ACT inhaler  Commonly known as:  PROVENTIL HFA;VENTOLIN HFA  Inhale 1 puff into the lungs every 6 (six) hours as needed for wheezing or shortness of breath.     ALPRAZolam 0.25 MG tablet  Commonly known as:  XANAX  Take 0.25 mg by mouth at bedtime as needed for anxiety or sleep.     apixaban 5 MG Tabs tablet  Commonly known as:  ELIQUIS  Take 1 tablet (5 mg total) by mouth 2 (two) times daily.     atorvastatin 40 MG tablet  Commonly known as:  LIPITOR  Take 40 mg by mouth daily at 6 PM.     cetirizine 10 MG tablet  Commonly known as:  ZYRTEC  Take 5-10 mg by mouth daily as needed for allergies or rhinitis.     docusate sodium 100 MG capsule  Commonly known as:  COLACE  Take 100 mg by mouth at bedtime as needed for mild constipation.     ferrous sulfate 325 (65 FE) MG tablet  Take 325 mg by mouth daily with breakfast.     furosemide 20 MG tablet  Commonly known as:  LASIX  Take 1 tablet (20 mg total) by mouth daily.     glimepiride 2 MG tablet  Commonly known as:  AMARYL  Take 1 mg by mouth daily before breakfast.     levothyroxine 25 MCG tablet  Commonly known as:  SYNTHROID, LEVOTHROID  Take 25 mcg by mouth daily before breakfast.     lisinopril 10 MG tablet  Commonly known as:  PRINIVIL,ZESTRIL  Take 1 tablet (10 mg total) by mouth at bedtime.     metoprolol 50 MG tablet  Commonly known as:  LOPRESSOR  Take 1 tablet (50 mg total) by mouth daily at 10 pm.     metoprolol 100 MG tablet  Commonly known as:  LOPRESSOR  Take 1 tablet (100 mg total) by mouth every morning.  Start taking on:  09/12/2014     potassium chloride SA 20 MEQ tablet  Commonly known as:   K-DUR,KLOR-CON  Take 20 mEq by mouth 2 (two) times daily.       Allergies  Allergen Reactions  . Chocolate     Hives   Follow-up Information    Follow up with Jacolyn ReedyMichele Lenze, PA-C On 09/18/2014.   Specialty:  Cardiology   Why:  1120 AM   Contact information:   [address] [place] [address] [phone]       Follow up with Milana ObeyKNOWLTON,[name], MD. Schedule an appointment as soon as possible for a visit in 1 week.   Specialty:  Family Medicine   Why:  Hospital follow up   Contact information:   [address] [place] [address] [phone]        The results of significant diagnostics from this hospitalization (including imaging, microbiology, ancillary and laboratory) are listed  below for reference.    Significant Diagnostic Studies: Dg Chest Portable 1 View  09/09/2014   CLINICAL DATA:  Tachycardia  EXAM: PORTABLE CHEST - 1 VIEW  COMPARISON:  07/13/2014 CT  FINDINGS: Heart size mildly enlarged. Scattered atherosclerosis of the aortic arch. Mild interstitial prominence. No overt effusion. No pneumothorax. Multilevel degenerative change.  IMPRESSION: Mild interstitial prominence can be seen in the setting of interstitial edema, atypical/viral infection, or pneumonitis.   Electronically Signed   By: Jearld Lesch M.D.   On: 09/09/2014 23:02    Microbiology: Recent Results (from the past 240 hour(s))  MRSA PCR Screening     Status: None   Collection Time: 09/10/14  2:00 AM  Result Value Ref Range Status   MRSA by PCR NEGATIVE NEGATIVE Final    Comment:        The GeneXpert MRSA Assay (FDA approved for NASAL specimens only), is one component of a comprehensive MRSA colonization surveillance program. It is not intended to diagnose MRSA infection nor to guide or monitor treatment for MRSA infections.      Labs: Basic Metabolic Panel:  Recent Labs Lab 09/09/14 2215 09/10/14 0534 09/11/14 0441  NA 138 140 137  K 3.8 3.7 3.9   CL 107 109 107  CO2 GLUCOSE 147* 108* 121*  BUN CREATININE 1.00 0.74 0.68  CALCIUM 9.1 8.7 8.5   Liver Function Tests:  Recent Labs Lab 09/09/14 2215 09/10/14 0534 09/11/14 0441  AST ALT ALKPHOS 88 76 71  BILITOT 0.7 0.7 0.5  PROT 7.2 6.3 6.0  ALBUMIN 3.6 3.2* 3.0*   No results for input(s): LIPASE, AMYLASE in the last 168 hours. No results for input(s): AMMONIA in the last 168 hours. CBC:  Recent Labs Lab 09/09/14 2215 09/10/14 0534 09/11/14 0441  WBC 5.5 4.5 5.2  NEUTROABS 3.2 2.2 3.1  HGB 11.7* 11.1* 10.4*  HCT 36.2 34.0* 31.7*  MCV 87.7 87.4 88.1  PLT 260 208 210   Cardiac Enzymes:  Recent Labs Lab 09/09/14 2215 09/10/14 0011 09/10/14 0534 09/10/14 1200  TROPONINI 0.05* 0.06* 0.10* 0.09*   BNP: BNP (last 3 results)  Recent Labs  09/10/14 0011  BNP 265.0*    ProBNP (last 3 results) No results for input(s): PROBNP in the last 8760 hours.  CBG:  Recent Labs Lab 09/10/14 2008 09/10/14 2345 09/11/14 0350 09/11/14 0717 09/11/14 1109  GLUCAP 227* 73 119* 90 132*       Signed:  Thera Basden  [facility] 09/11/2014, 1:27 PM

## 2014-09-11 NOTE — Progress Notes (Signed)
Pt a/o.vss. Saline lock removed. No complaints of any pain. Discharge instructions given. Pt verbalized understanding of instructions. Pt left floor via wheelchair with family and nursing staff.

## 2014-09-11 NOTE — Discharge Instructions (Signed)

## 2014-09-18 ENCOUNTER — Ambulatory Visit (INDEPENDENT_AMBULATORY_CARE_PROVIDER_SITE_OTHER): Payer: Medicare Other | Admitting: Physician Assistant

## 2014-09-18 ENCOUNTER — Encounter: Payer: Self-pay | Admitting: Physician Assistant

## 2014-09-18 VITALS — BP 136/70 | HR 67 | Ht 66.0 in | Wt 167.8 lb

## 2014-09-18 DIAGNOSIS — R0609 Other forms of dyspnea: Secondary | ICD-10-CM

## 2014-09-18 DIAGNOSIS — R42 Dizziness and giddiness: Secondary | ICD-10-CM

## 2014-09-18 DIAGNOSIS — I4891 Unspecified atrial fibrillation: Secondary | ICD-10-CM | POA: Diagnosis not present

## 2014-09-18 DIAGNOSIS — Z136 Encounter for screening for cardiovascular disorders: Secondary | ICD-10-CM

## 2014-09-18 DIAGNOSIS — I10 Essential (primary) hypertension: Secondary | ICD-10-CM

## 2014-09-18 DIAGNOSIS — I5032 Chronic diastolic (congestive) heart failure: Secondary | ICD-10-CM | POA: Insufficient documentation

## 2014-09-18 NOTE — Addendum Note (Signed)
Addended by: Dyann KiefLENZE, Marsean Elkhatib M on: 09/18/2014 01:08 PM   Modules accepted: Level of Service

## 2014-09-18 NOTE — Patient Instructions (Signed)
Your physician recommends that you schedule a follow-up appointment in: 2 months with Dr. McDowell  Your physician recommends that you continue on your current medications as directed. Please refer to the Current Medication list given to you today.  Thank you for choosing [facility]!    

## 2014-09-18 NOTE — Progress Notes (Signed)
Cardiology Office Note   Date:  09/18/2014   ID:  [name], DOB 15-Jul-1926, MRN [id number]  PCP:  Milana ObeyKNOWLTON,[name], MD  Cardiologist:  Dr. Simona HuhSam McDowell  Chief Complaint: Dyspnea on exertion, some dizziness    History of Present Illness: [name] is a 81 y.o. female who presents for post hospital follow-up after recent admission with atrial fibrillation with RVR. She converted to sinus rhythm with IV diltiazem and her Lopressor was increased to 100 mg in the morning 50 mg in the evening. Her lisinopril was decreased to 10 mg daily for soft blood pressures. She has a chadsvasc score of 5 and Eliquis was continued. She had a slight bump in her troponins were checked was felt secondary to demand ischemia. She has chronic diastolic heart failure but did not require IV diuresis. She also has hypertension, chronic left bundle branch block, DM 2, chronic renal insufficiency. Last 2-D echo in 05/2014 EF 50-55% no wall motion abnormalities, grade 3 diastolic dysfunction, moderate MR.  Patient comes in today accompanied by her daughter. Overall she is been doing pretty well. She says she gets a low dizzy in the morning after she takes her medications. She is concerned that she's losing weight and doesn't have much of an appetite. Her blood pressure has been stable at home but her diabetes medicine was cut in half because of low sugars. Her sugar was 114 yesterday when she was dizzy. She has chronic dyspnea on exertion and is wondering if it's because of her atrial fibrillation. She says she feels better after she uses an inhaler. She's had no edema.    Past Medical History  Diagnosis Date  . Hypothyroidism   . Hypertension   . Type 2 diabetes mellitus   . Palpitations     PAT/EAT/NSVT, normal LVEF  . Mitral regurgitation     Mild  . Anxiety   . Hyperlipidemia   . Depression     Past Surgical History  Procedure Laterality Date  . None    . 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 hiatal hernia. Antral erosions status post gastric biopsy.  [name]. Maloney dilation N/A 04/17/2014    Procedure: Elease HashimotoMALONEY DILATION;  Surgeon: Corbin Adeobert M Rourk, MD;  Location: [facility];  Service: Endoscopy;  Laterality: N/A;     Current Outpatient Prescriptions  Medication Sig Dispense Refill  . albuterol (PROVENTIL HFA;VENTOLIN HFA) 108 (90 BASE) MCG/ACT inhaler Inhale 1 puff into the lungs every 6 (six) hours as needed for wheezing or shortness of breath.    . ALPRAZolam (XANAX) 0.25 MG tablet Take 0.25 mg by mouth at bedtime as needed for anxiety or sleep.     Marland Kitchen. apixaban (ELIQUIS) 5 MG TABS tablet Take 1 tablet (5 mg total) by mouth 2 (two) times daily. 14 tablet 0  . atorvastatin (LIPITOR) 40 MG tablet Take 40 mg by mouth daily at 6 PM.    . cetirizine (ZYRTEC) 10 MG tablet Take 5-10 mg by mouth daily as needed for allergies or rhinitis.     Marland Kitchen. docusate sodium (COLACE) 100 MG capsule Take 100 mg by mouth at bedtime as needed for mild constipation.     . ferrous sulfate 325 (65 FE) MG tablet Take 325 mg by mouth daily with breakfast.    . furosemide (LASIX) 20 MG tablet Take 1 tablet (20 mg total) by mouth daily. 30 tablet 0  .  glimepiride (AMARYL) 2 MG tablet Take 1 mg by mouth daily before breakfast.     . levothyroxine (SYNTHROID, LEVOTHROID) 25 MCG tablet Take 25 mcg by mouth daily before breakfast.    . lisinopril (PRINIVIL,ZESTRIL) 10 MG tablet Take 1 tablet (10 mg total) by mouth at bedtime. 30 tablet 0  . metoprolol (LOPRESSOR) 100 MG tablet Take 1 tablet (100 mg total) by mouth every morning. 30 tablet 0  . metoprolol (LOPRESSOR) 50 MG tablet Take 1 tablet (50 mg total) by mouth daily at 10 pm. 30 tablet 0  . potassium chloride SA (K-DUR,KLOR-CON) 20 MEQ tablet Take 20 mEq by mouth 2 (two) times daily.     No current facility-administered medications for this visit.     Allergies:   Chocolate    Social History:  The patient  reports that she has never smoked. She has never used smokeless tobacco. She reports that she does not drink alcohol or use illicit drugs.   Family History:  The patient's    family history includes Arthritis in an other family member; Cancer in her brother; Colon cancer in an other family member; Heart failure in her mother; Tuberculosis in her father.    ROS:  Please see the history of present illness.   Otherwise, review of systems are positive for none.   All other systems are reviewed and negative.    PHYSICAL EXAM: VS:  BP 136/70 mmHg  Pulse 67  Ht  (1.676 m)  Wt 167 lb 12.8 oz (76.114 kg)  BMI 27.10 kg/m2  SpO2 99% , BMI Body mass index is 27.1 kg/(m^2). I took blood pressures lying sitting and standing and there was no change. GEN: Well nourished, well developed, in no acute distress Neck: no JVD, HJR, carotid bruits, or masses Cardiac: RRR; positive S4, 1 to 2/6 systolic murmur at the left sternal border, no rubs, thrill or heave,  Respiratory:  clear to auscultation bilaterally, normal work of breathing GI: soft, nontender, nondistended, + BS MS: no deformity or atrophy Extremities: without cyanosis, clubbing, edema, good distal pulses bilaterally.  Skin: warm and dry, no rash Neuro:  Strength and sensation are intact    EKG:  EKG is ordered today. The ekg ordered today demonstrates normal sinus rhythm with left bundle branch block, PACs, no acute change Recent Labs: 07/15/2014: Magnesium 2.0 09/09/2014: TSH 2.120 09/10/2014: B Natriuretic Peptide 265.0* 09/11/2014: ALT 15; BUN 13; Creatinine 0.68; Hemoglobin 10.4*; Platelets 210; Potassium 3.9; Sodium 137    Lipid Panel    Component Value Date/Time   CHOL 142 07/14/2014 0456   TRIG 32 07/14/2014 0456   HDL 58 07/14/2014 0456   CHOLHDL 2.4 07/14/2014 0456   VLDL 6 07/14/2014 0456   LDLCALC 78 07/14/2014 0456      Wt Readings from Last 3  Encounters:  09/18/14 167 lb 12.8 oz (76.114 kg)  09/10/14 165 lb 9.1 oz (75.1 kg)  07/31/14 174 lb 12.8 oz (79.289 kg)      Other studies Reviewed: Additional studies/ records that were reviewed today include and review of the records demonstrates:   1. Echocardiogram: 06/13/2014 Left ventricle: The cavity size was normal. Systolic function was   normal. The estimated ejection fraction was in the range of 50%   to 55%. Wall motion was normal; there were no regional wall   motion abnormalities. There was a reduced contribution of atrial   contraction to ventricular filling, due to increased ventricular   diastolic pressure or atrial contractile  dysfunction. Doppler   parameters are consistent with a reversible restrictive pattern,   indicative of decreased left ventricular diastolic compliance   and/or increased left atrial pressure (grade 3 diastolic   dysfunction). - Ventricular septum: Septal motion showed paradox. These changes   are consistent with intraventricular conduction delay. - Aortic valve: Trileaflet; normal thickness, mildly calcified   leaflets. - Mitral valve: There was moderate regurgitation. - Left atrium: The atrium was mildly to moderately dilated. - Tricuspid valve: There was moderate regurgitation. - Pulmonary arteries: PA peak pressure: 34 mm Hg (S).  NM Stress Test 2010 Recent pharmacologic perfusion imaging study was normal (per office note)    ASSESSMENT AND PLAN:  Atrial fibrillation with RVR Patient had hospitalization with rapid atrial fibrillation converted to normal sinus rhythm with IV diltiazem. Her Lopressor was increased to 100 mg in the morning 50 in the evening. She seems to be maintaining normal sinus rhythm. She has had no further rapid heart beats. She does have chronic dyspnea on exertion but I suspect this is related to her diastolic dysfunction, lung disease, and deconditioning.   Essential hypertension, benign Blood pressure is  stable.   Dyspnea on exertion I suspect her dyspnea on exertion is due to diastolic dysfunction and deconditioning. She does use an inhaler for some type of lung disease. They will discuss with Dr. Sudie Bailey on Friday.   Chronic diastolic heart failure Patient has chronic diastolic heart failure. No evidence of acute heart failure and exam today. Continue Lasix 20 mg once daily.   Dizziness Patient has dizziness early in the morning and after she takes her medications. Her blood sugars have been stable on lower dose Amaryl. She is not orthostatic in the office. I've asked her to be careful when she gets up. I've also asked her to take her blood pressures at home when she is dizzy.     Elson Clan, PA-C  09/18/2014 11:55 AM    [facility] [address] [place], [place], [place]  [address] Phone: [phone]; Fax: [phone]

## 2014-09-18 NOTE — Assessment & Plan Note (Signed)
Blood pressure is stable 

## 2014-09-18 NOTE — Assessment & Plan Note (Signed)
I suspect her dyspnea on exertion is due to diastolic dysfunction and deconditioning. She does use an inhaler for some type of lung disease. They will discuss with Dr. Sudie BaileyKnowlton on Friday.

## 2014-09-18 NOTE — Assessment & Plan Note (Signed)
Patient has chronic diastolic heart failure. No evidence of acute heart failure and exam today. Continue Lasix 20 mg once daily.

## 2014-09-18 NOTE — Assessment & Plan Note (Signed)
Patient had hospitalization with rapid atrial fibrillation converted to normal sinus rhythm with IV diltiazem. Her Lopressor was increased to 100 mg in the morning 50 in the evening. She seems to be maintaining normal sinus rhythm. She has had no further rapid heart beats. She does have chronic dyspnea on exertion but I suspect this is related to her diastolic dysfunction, lung disease, and deconditioning.

## 2014-09-18 NOTE — Assessment & Plan Note (Signed)
Patient has dizziness early in the morning and after she takes her medications. Her blood sugars have been stable on lower dose Amaryl. She is not orthostatic in the office. I've asked her to be careful when she gets up. I've also asked her to take her blood pressures at home when she is dizzy.

## 2014-09-23 ENCOUNTER — Telehealth: Payer: Self-pay | Admitting: Internal Medicine

## 2014-09-23 NOTE — Telephone Encounter (Signed)
Spoke with the pts daughterOlegario Messier- Kathy, she said the pt was discharged from the hospital last week, she went to the cardiologist on Tuesday and Dr.[name] on Thursday and as they were going over her discharge paperwork and her medications, they noticed the hospital had put in her instructions to stop the omeprazole. They didn't know about it and she had been taking it. She told Dr.[name] that she was not having any reflux symptoms at this time. He told her to taper herself off of the medication ( take on every other day for 2 weeks, then take one every 3rd day for 2 weeks, then stop) and for her to call us and get our recommendations. The pts family has no idea why they wanted her to stop it. I have checked the notes from the hospital and the only reference to omeprazole that I can find is on her discharge paperwork.   Should pt just stop the omeprazole or do you want to switch her to something else?

## 2014-09-23 NOTE — Telephone Encounter (Signed)
PATIENT WAS IN HOSPITAL AND THEY TOLD HER TO STOP TAKING OMEPRAZOLE.   PLEASE CALL DAUGHTER REGARDING THIS  Charlene AbideKATHY [name]  [phone]   OR [phone]

## 2014-09-25 ENCOUNTER — Encounter (HOSPITAL_COMMUNITY): Payer: Self-pay | Admitting: Cardiology

## 2014-09-25 ENCOUNTER — Emergency Department (HOSPITAL_COMMUNITY): Payer: Medicare Other

## 2014-09-25 ENCOUNTER — Telehealth: Payer: Self-pay | Admitting: Cardiology

## 2014-09-25 ENCOUNTER — Observation Stay (HOSPITAL_COMMUNITY)
Admission: EM | Admit: 2014-09-25 | Discharge: 2014-09-27 | Disposition: A | Discharge status: Home / Self Care | Payer: Medicare Other | Attending: Internal Medicine | Admitting: Internal Medicine

## 2014-09-25 DIAGNOSIS — I4891 Unspecified atrial fibrillation: Principal | ICD-10-CM | POA: Insufficient documentation

## 2014-09-25 DIAGNOSIS — F419 Anxiety disorder, unspecified: Secondary | ICD-10-CM | POA: Insufficient documentation

## 2014-09-25 DIAGNOSIS — E039 Hypothyroidism, unspecified: Secondary | ICD-10-CM | POA: Insufficient documentation

## 2014-09-25 DIAGNOSIS — E119 Type 2 diabetes mellitus without complications: Secondary | ICD-10-CM | POA: Diagnosis not present

## 2014-09-25 DIAGNOSIS — I340 Nonrheumatic mitral (valve) insufficiency: Secondary | ICD-10-CM | POA: Diagnosis not present

## 2014-09-25 DIAGNOSIS — R0602 Shortness of breath: Secondary | ICD-10-CM | POA: Diagnosis present

## 2014-09-25 DIAGNOSIS — E785 Hyperlipidemia, unspecified: Secondary | ICD-10-CM | POA: Diagnosis not present

## 2014-09-25 DIAGNOSIS — I10 Essential (primary) hypertension: Secondary | ICD-10-CM | POA: Insufficient documentation

## 2014-09-25 DIAGNOSIS — R001 Bradycardia, unspecified: Secondary | ICD-10-CM

## 2014-09-25 DIAGNOSIS — I484 Atypical atrial flutter: Secondary | ICD-10-CM | POA: Diagnosis not present

## 2014-09-25 DIAGNOSIS — E118 Type 2 diabetes mellitus with unspecified complications: Secondary | ICD-10-CM

## 2014-09-25 DIAGNOSIS — F329 Major depressive disorder, single episode, unspecified: Secondary | ICD-10-CM | POA: Diagnosis not present

## 2014-09-25 DIAGNOSIS — Z79899 Other long term (current) drug therapy: Secondary | ICD-10-CM | POA: Insufficient documentation

## 2014-09-25 DIAGNOSIS — I5032 Chronic diastolic (congestive) heart failure: Secondary | ICD-10-CM | POA: Diagnosis not present

## 2014-09-25 DIAGNOSIS — I4892 Unspecified atrial flutter: Secondary | ICD-10-CM

## 2014-09-25 DIAGNOSIS — R0609 Other forms of dyspnea: Secondary | ICD-10-CM

## 2014-09-25 HISTORY — DX: Unspecified atrial fibrillation: I48.91

## 2014-09-25 LAB — CBC WITH DIFFERENTIAL/PLATELET
Basophils Absolute: 0 10*3/uL (ref 0.0–0.1)
Basophils Relative: 1 % (ref 0–1)
EOS ABS: 0 10*3/uL (ref 0.0–0.7)
Eosinophils Relative: 0 % (ref 0–5)
HCT: 37.2 % (ref 36.0–46.0)
HEMOGLOBIN: 12.1 g/dL (ref 12.0–15.0)
LYMPHS ABS: 1.5 10*3/uL (ref 0.7–4.0)
LYMPHS PCT: 26 % (ref 12–46)
MCH: 28.6 pg (ref 26.0–34.0)
MCHC: 32.5 g/dL (ref 30.0–36.0)
MCV: 87.9 fL (ref 78.0–100.0)
MONOS PCT: 6 % (ref 3–12)
Monocytes Absolute: 0.3 10*3/uL (ref 0.1–1.0)
NEUTROS ABS: 3.8 10*3/uL (ref 1.7–7.7)
NEUTROS PCT: 67 % (ref 43–77)
Platelets: 292 10*3/uL (ref 150–400)
RBC: 4.23 MIL/uL (ref 3.87–5.11)
RDW: 14.8 % (ref 11.5–15.5)
WBC: 5.7 10*3/uL (ref 4.0–10.5)

## 2014-09-25 LAB — TROPONIN I
TROPONIN I: 0.11 ng/mL — AB (ref <0.031)
TROPONIN I: 0.18 ng/mL — AB (ref <0.031)

## 2014-09-25 LAB — URINALYSIS, ROUTINE W REFLEX MICROSCOPIC
BILIRUBIN URINE: NEGATIVE
Glucose, UA: NEGATIVE mg/dL
Hgb urine dipstick: NEGATIVE
Ketones, ur: NEGATIVE mg/dL
NITRITE: NEGATIVE
PH: 5.5 (ref 5.0–8.0)
PROTEIN: NEGATIVE mg/dL
Specific Gravity, Urine: 1.015 (ref 1.005–1.030)
UROBILINOGEN UA: 0.2 mg/dL (ref 0.0–1.0)

## 2014-09-25 LAB — COMPREHENSIVE METABOLIC PANEL
ALT: 16 U/L (ref 14–54)
ANION GAP: 10 (ref 5–15)
AST: 21 U/L (ref 15–41)
Albumin: 3.6 g/dL (ref 3.5–5.0)
Alkaline Phosphatase: 79 U/L (ref 38–126)
BUN: 17 mg/dL (ref 6–20)
CO2: 21 mmol/L — AB (ref 22–32)
CREATININE: 0.9 mg/dL (ref 0.44–1.00)
Calcium: 9 mg/dL (ref 8.9–10.3)
Chloride: 106 mmol/L (ref 101–111)
GFR calc Af Amer: >60 mL/min (ref >60)
GFR, EST NON AFRICAN AMERICAN: 57 mL/min — AB (ref >60)
Glucose, Bld: 143 mg/dL — ABNORMAL HIGH (ref 70–99)
Potassium: 3.5 mmol/L (ref 3.5–5.1)
Sodium: 137 mmol/L (ref 135–145)
Total Bilirubin: 1.1 mg/dL (ref 0.3–1.2)
Total Protein: 7.2 g/dL (ref 6.5–8.1)

## 2014-09-25 LAB — MRSA PCR SCREENING: MRSA by PCR: NEGATIVE

## 2014-09-25 LAB — URINE MICROSCOPIC-ADD ON

## 2014-09-25 LAB — PROTIME-INR
INR: 1.47 (ref 0.00–1.49)
Prothrombin Time: 17.9 seconds — ABNORMAL HIGH (ref 11.6–15.2)

## 2014-09-25 LAB — GLUCOSE, CAPILLARY: GLUCOSE-CAPILLARY: 134 mg/dL — AB (ref 70–99)

## 2014-09-25 MED ORDER — INSULIN ASPART 100 UNIT/ML ~~LOC~~ SOLN
0.0000 [IU] | Freq: Three times a day (TID) | SUBCUTANEOUS | Status: DC
  Administered 2014-09-26: 2 [IU] via SUBCUTANEOUS

## 2014-09-25 MED ORDER — AMIODARONE HCL IN DEXTROSE 360-4.14 MG/200ML-% IV SOLN
30.0000 mg/h | INTRAVENOUS | Status: DC
  Administered 2014-09-25 – 2014-09-26 (×2): 30 mg/h via INTRAVENOUS
  Filled 2014-09-25 (×2): qty 200

## 2014-09-25 MED ORDER — SODIUM CHLORIDE 0.9 % IJ SOLN
3.0000 mL | Freq: Two times a day (BID) | INTRAMUSCULAR | Status: DC
  Administered 2014-09-25: 3 mL via INTRAVENOUS

## 2014-09-25 MED ORDER — SODIUM CHLORIDE 0.9 % IJ SOLN: 3.0000 mL | INTRAMUSCULAR | Status: DC | PRN

## 2014-09-25 MED ORDER — ACETAMINOPHEN 650 MG RE SUPP: 650.0000 mg | Freq: Four times a day (QID) | RECTAL | Status: DC | PRN

## 2014-09-25 MED ORDER — LORATADINE 10 MG PO TABS
10.0000 mg | ORAL_TABLET | Freq: Every day | ORAL | Status: DC
  Administered 2014-09-27: 10 mg via ORAL
  Filled 2014-09-25: qty 1

## 2014-09-25 MED ORDER — DILTIAZEM LOAD VIA INFUSION
10.0000 mg | Freq: Once | INTRAVENOUS | Status: AC
  Administered 2014-09-25: 10 mg via INTRAVENOUS
  Filled 2014-09-25: qty 10

## 2014-09-25 MED ORDER — ONDANSETRON HCL 4 MG/2ML IJ SOLN: 4.0000 mg | Freq: Four times a day (QID) | INTRAMUSCULAR | Status: DC | PRN

## 2014-09-25 MED ORDER — PANTOPRAZOLE SODIUM 40 MG PO TBEC
40.0000 mg | DELAYED_RELEASE_TABLET | Freq: Every day | ORAL | Status: DC
  Administered 2014-09-26 – 2014-09-27 (×2): 40 mg via ORAL
  Filled 2014-09-25 (×2): qty 1

## 2014-09-25 MED ORDER — GLIMEPIRIDE 2 MG PO TABS
1.0000 mg | ORAL_TABLET | Freq: Every day | ORAL | Status: DC
  Administered 2014-09-26 – 2014-09-27 (×2): 1 mg via ORAL
  Filled 2014-09-25 (×2): qty 1

## 2014-09-25 MED ORDER — ASPIRIN 81 MG PO CHEW
324.0000 mg | CHEWABLE_TABLET | Freq: Once | ORAL | Status: DC
  Filled 2014-09-25: qty 4

## 2014-09-25 MED ORDER — LEVOTHYROXINE SODIUM 25 MCG PO TABS
25.0000 ug | ORAL_TABLET | Freq: Every day | ORAL | Status: DC
  Administered 2014-09-26 – 2014-09-27 (×2): 25 ug via ORAL
  Filled 2014-09-25 (×2): qty 1

## 2014-09-25 MED ORDER — MORPHINE SULFATE 2 MG/ML IJ SOLN: 1.0000 mg | INTRAMUSCULAR | Status: DC | PRN

## 2014-09-25 MED ORDER — INSULIN ASPART 100 UNIT/ML ~~LOC~~ SOLN: 0.0000 [IU] | Freq: Every day | SUBCUTANEOUS | Status: DC

## 2014-09-25 MED ORDER — ATORVASTATIN CALCIUM 40 MG PO TABS
40.0000 mg | ORAL_TABLET | Freq: Every day | ORAL | Status: DC
  Administered 2014-09-25 – 2014-09-26 (×2): 40 mg via ORAL
  Filled 2014-09-25 (×2): qty 1

## 2014-09-25 MED ORDER — TRAZODONE HCL 50 MG PO TABS
25.0000 mg | ORAL_TABLET | Freq: Every evening | ORAL | Status: DC | PRN
  Administered 2014-09-25: 25 mg via ORAL
  Filled 2014-09-25: qty 1

## 2014-09-25 MED ORDER — SENNOSIDES-DOCUSATE SODIUM 8.6-50 MG PO TABS
1.0000 | ORAL_TABLET | Freq: Every evening | ORAL | Status: DC | PRN
Start: 2014-09-25 — End: 2014-09-27

## 2014-09-25 MED ORDER — ALUM & MAG HYDROXIDE-SIMETH 200-200-20 MG/5ML PO SUSP: 30.0000 mL | Freq: Four times a day (QID) | ORAL | Status: DC | PRN

## 2014-09-25 MED ORDER — ALPRAZOLAM 0.25 MG PO TABS: 0.2500 mg | ORAL_TABLET | Freq: Every evening | ORAL | Status: DC | PRN

## 2014-09-25 MED ORDER — POLYETHYLENE GLYCOL 3350 17 G PO PACK: 17.0000 g | PACK | Freq: Every day | ORAL | Status: DC | PRN

## 2014-09-25 MED ORDER — AMIODARONE HCL IN DEXTROSE 360-4.14 MG/200ML-% IV SOLN
60.0000 mg/h | INTRAVENOUS | Status: AC
Start: 2014-09-25 — End: 2014-09-25
  Administered 2014-09-25: 60 mg/h via INTRAVENOUS
  Filled 2014-09-25: qty 200

## 2014-09-25 MED ORDER — ALBUTEROL SULFATE (2.5 MG/3ML) 0.083% IN NEBU: 3.0000 mL | INHALATION_SOLUTION | Freq: Four times a day (QID) | RESPIRATORY_TRACT | Status: DC | PRN

## 2014-09-25 MED ORDER — APIXABAN 5 MG PO TABS
5.0000 mg | ORAL_TABLET | Freq: Two times a day (BID) | ORAL | Status: DC
  Administered 2014-09-25 – 2014-09-26 (×3): 5 mg via ORAL
  Filled 2014-09-25 (×3): qty 1

## 2014-09-25 MED ORDER — DILTIAZEM HCL 100 MG IV SOLR
5.0000 mg/h | INTRAVENOUS | Status: DC
  Administered 2014-09-25 (×2): 5 mg/h via INTRAVENOUS
  Filled 2014-09-25: qty 100

## 2014-09-25 MED ORDER — SODIUM CHLORIDE 0.9 % IJ SOLN
3.0000 mL | Freq: Two times a day (BID) | INTRAMUSCULAR | Status: DC
  Administered 2014-09-25 – 2014-09-26 (×3): 3 mL via INTRAVENOUS

## 2014-09-25 MED ORDER — ONDANSETRON HCL 4 MG PO TABS: 4.0000 mg | ORAL_TABLET | Freq: Four times a day (QID) | ORAL | Status: DC | PRN

## 2014-09-25 MED ORDER — AMIODARONE LOAD VIA INFUSION
150.0000 mg | Freq: Once | INTRAVENOUS | Status: DC
  Filled 2014-09-25: qty 83.34

## 2014-09-25 MED ORDER — SODIUM CHLORIDE 0.9 % IV SOLN: 250.0000 mL | INTRAVENOUS | Status: DC | PRN

## 2014-09-25 MED ORDER — FERROUS SULFATE 325 (65 FE) MG PO TABS
325.0000 mg | ORAL_TABLET | Freq: Every day | ORAL | Status: DC
  Administered 2014-09-26 – 2014-09-27 (×2): 325 mg via ORAL
  Filled 2014-09-25 (×2): qty 1

## 2014-09-25 MED ORDER — ACETAMINOPHEN 325 MG PO TABS: 650.0000 mg | ORAL_TABLET | Freq: Four times a day (QID) | ORAL | Status: DC | PRN

## 2014-09-25 NOTE — Telephone Encounter (Signed)
Reviewed chart. Patient just seen in the office by Ms. Lilla ShookLenze PA-C in late April following hospitalization with paroxysmal atrial fibrillation and RVR that converted spontaneously on diltiazem and Lopressor. Based on description of current heart rates and blood pressures, I suspect that she is back in atrial fibrillation. It is going to be difficult to try manage this as an outpatient, particularly since she has what sounds like symptomatic hypotension. She will need further medication adjustments, and probably to be placed on amiodarone for rhythm suppression. I think best option is to take her in to the ER and consider hospitalization for further medication adjustments and management.

## 2014-09-25 NOTE — Telephone Encounter (Signed)
Daughter called stating that at 7:30am this morning mothers blood pressure is 91/52 right arm with pulse 139  --left arm 85/55 with pulse 145. States that she is having Shortness of breath and weak.

## 2014-09-25 NOTE — Telephone Encounter (Addendum)
Spoke with daughter who is home with patient and she informed nurse that for the past couple of day the patient's heart rate has been elevated and patient c/o increased SOB. Patient's heart rate has been ranging from 99-145. BP this morning 91/52 on right side and HR 139. Left side BP 85/55 & HR 145. Patient is eating and drinking okay. No c/o chest pain or dizziness and patient has a steady gait. Patient hasn't taken her morning medications. She take lopressor 100 mg in the morning and 50 mg in the evening. All cardiac medications reconciled while on the phone with patient.

## 2014-09-25 NOTE — ED Notes (Signed)
Sob since yesterday.  Worse  This morning.  Denies any pain. Per daughter her blood pressure has been low this morning  80-90 systolic and heart rate is in the 140's

## 2014-09-25 NOTE — ED Provider Notes (Signed)
CSN: [id number]     Arrival date & time 09/25/14  1014 History  This chart was scribed for Charlene OctaveStephen Eilis Chestnutt, MD by Jarvis Morganaylor Ferguson, ED Scribe. This patient was seen in room APA06/[facility]06 and the patient's care was started at 10:30 AM.    Chief Complaint  Patient presents with  . Shortness of Breath    The history is provided by the patient and a relative. No language interpreter was used.    HPI Comments: Charlene BoardsSophia M [name] is a 81 y.o. female with a h/o hypothyroidism, HTN, hyperlipidemia, a-fib and type II DM who presents to the Emergency Department complaining of gradually worsening SOB that began today. She states the SOB began yesterday but she was able to use her inhaler and it provided relief. She states this morning is when it became worse. She is having associated heart palpitations and leg swelling. Pt has a h/o a-fib and she takes her medications regularly. She also takes Eliquis and is compliant with her medication. Pt denies any h/o cardiac procedures. Pt has a h/o Type II DM for which she takes Lasix. She checked her BS today and it was 129.  Pt's daughter also notes that the pt's BP has been running lower than her normal with systolic readings in the 80-90s. Pt denies any chest pain, HA or abdominal pain.  Past Medical History  Diagnosis Date  . Hypothyroidism   . Hypertension   . Type 2 diabetes mellitus   . Palpitations     PAT/EAT/NSVT, normal LVEF  . Mitral regurgitation     Mild  . Anxiety   . Hyperlipidemia   . Depression   . Atrial fibrillation    Past Surgical History  Procedure Laterality Date  . None    . 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 hiatal hernia. Antral erosions status post gastric biopsy.  [name]. Maloney dilation N/A 04/17/2014    Procedure: Elease HashimotoMALONEY DILATION;  Surgeon: Corbin Adeobert M Rourk, MD;  Location: [facility];  Service: Endoscopy;  Laterality:  N/A;   Family History  Problem Relation Age of Onset  . Heart failure Mother     Died in her 5670s  . Tuberculosis Father     Died at young age  . Cancer Brother   . Arthritis    . Colon cancer     History  Substance Use Topics  . Smoking status: Never Smoker   . Smokeless tobacco: Never Used  . Alcohol Use: No   OB History    Gravida Para Term Preterm AB TAB SAB Ectopic Multiple Living   5 5 4 1      5      Review of Systems A complete 10 system review of systems was obtained and all systems are negative except as noted in the HPI and PMH.     Allergies  Chocolate  Home Medications   Prior to Admission medications   Medication Sig Start Date End Date Taking? Authorizing Provider  albuterol (PROVENTIL HFA;VENTOLIN HFA) 108 (90 BASE) MCG/ACT inhaler Inhale 1 puff into the lungs every 6 (six) hours as needed for wheezing or shortness of breath.   Yes Historical Provider, MD  ALPRAZolam Prudy Feeler(XANAX) 0.25 MG tablet Take 0.25 mg by mouth at bedtime as needed for anxiety or sleep.    Yes Historical Provider, MD  apixaban (ELIQUIS) 5 MG TABS tablet Take 1 tablet (5 mg total) by mouth 2 (two)  times daily. 08/12/14  Yes Jonelle Sidle, MD  atorvastatin (LIPITOR) 40 MG tablet Take 40 mg by mouth daily at 6 PM. 09/27/11  Yes Jonelle Sidle, MD  cetirizine (ZYRTEC) 10 MG tablet Take 5-10 mg by mouth daily as needed for allergies or rhinitis.    Yes Historical Provider, MD  ferrous sulfate 325 (65 FE) MG tablet Take 325 mg by mouth daily with breakfast.   Yes Historical Provider, MD  furosemide (LASIX) 20 MG tablet Take 1 tablet (20 mg total) by mouth daily. Patient taking differently: Take 20 mg by mouth daily as needed.  09/11/14  Yes Maryann Mikhail, DO  glimepiride (AMARYL) 2 MG tablet Take 1 mg by mouth daily before breakfast.    Yes Historical Provider, MD  levothyroxine (SYNTHROID, LEVOTHROID) 25 MCG tablet Take 25 mcg by mouth daily before breakfast.   Yes Historical Provider, MD   lisinopril (PRINIVIL,ZESTRIL) 10 MG tablet Take 1 tablet (10 mg total) by mouth at bedtime. 09/11/14  Yes Maryann Mikhail, DO  metoprolol (LOPRESSOR) 100 MG tablet Take 1 tablet (100 mg total) by mouth every morning. 09/12/14  Yes Maryann Mikhail, DO  metoprolol (LOPRESSOR) 50 MG tablet Take 1 tablet (50 mg total) by mouth daily at 10 pm. 09/11/14  Yes Maryann Mikhail, DO  omeprazole (PRILOSEC) 20 MG capsule Take 1 capsule by mouth every other day.  09/06/14  Yes Historical Provider, MD  polyethylene glycol (MIRALAX / GLYCOLAX) packet Take 17 g by mouth daily as needed for mild constipation or moderate constipation.   Yes Historical Provider, MD  potassium chloride SA (K-DUR,KLOR-CON) 20 MEQ tablet Take 20 mEq by mouth daily as needed (when taking furosemide).    Yes Historical Provider, MD   Triage Vitals: BP 140/81 mmHg  Pulse 144  Temp(Src) 98.1 F (36.7 C) (Oral)  Resp 24  Ht  (1.676 m)  Wt 167 lb (75.751 kg)  BMI 26.97 kg/m2  SpO2 99%  Physical Exam  Constitutional: She is oriented to person, place, and time. She appears well-developed and well-nourished. No distress.  HENT:  Head: Normocephalic and atraumatic.  Mouth/Throat: Oropharynx is clear and moist. No oropharyngeal exudate.  Eyes: Conjunctivae and EOM are normal. Pupils are equal, round, and reactive to light.  Neck: Normal range of motion. Neck supple.  No meningismus.  Cardiovascular: Regular rhythm, normal heart sounds and intact distal pulses.  Tachycardia present.   No murmur heard. Pulmonary/Chest: Effort normal and breath sounds normal. No respiratory distress. She has no wheezes.  Abdominal: Soft. There is no tenderness. There is no rebound and no guarding.  Musculoskeletal: Normal range of motion. She exhibits edema. She exhibits no tenderness.  +1 edema to bilateral legs  Neurological: She is alert and oriented to person, place, and time. No cranial nerve deficit. She exhibits normal muscle tone. Coordination  normal.  No ataxia on finger to nose bilaterally. No pronator drift. 5/5 strength throughout. CN 2-12 intact. Negative Romberg. Equal grip strength. Sensation intact. Gait is normal.   Skin: Skin is warm.  Psychiatric: She has a normal mood and affect. Her behavior is normal.  Nursing note and vitals reviewed.   ED Course  Procedures (including critical care time)  DIAGNOSTIC STUDIES: Oxygen Saturation is 99% on RA, normal by my interpretation.    COORDINATION OF CARE:    Labs Review Labs Reviewed  COMPREHENSIVE METABOLIC PANEL - Abnormal; Notable for the following:    CO2 21 (*)    Glucose, Bld 143 (*)  GFR calc non Af Amer 57 (*)    All other components within normal limits  PROTIME-INR - Abnormal; Notable for the following:    Prothrombin Time 17.9 (*)    All other components within normal limits  TROPONIN I - Abnormal; Notable for the following:    Troponin I 0.11 (*)    All other components within normal limits  URINALYSIS, ROUTINE W REFLEX MICROSCOPIC - Abnormal; Notable for the following:    Leukocytes, UA SMALL (*)    All other components within normal limits  URINE MICROSCOPIC-ADD ON - Abnormal; Notable for the following:    Squamous Epithelial / LPF FEW (*)    All other components within normal limits  CBC WITH DIFFERENTIAL/PLATELET    Imaging Review Dg Chest Portable 1 View  09/25/2014   CLINICAL DATA:  81 year old female with shortness of breath since yesterday, not relieved by inhaler. Initial encounter.  EXAM: PORTABLE CHEST - 1 VIEW  COMPARISON:  09/09/2014 and earlier.  FINDINGS: Portable AP supine view at 1041 hours. Stable cardiac size and mediastinal contours. Stable lung volumes. No pneumothorax, pleural effusion or consolidation. Pulmonary vascularity appears decreased. No acute edema. Stable visualized osseous structures.  IMPRESSION: No acute cardiopulmonary abnormality.   Electronically Signed   By: Odessa FlemingH  Hall M.D.   On: 09/25/2014 10:53     EKG  Interpretation   Date/Time:  Wednesday Sep 25 2014 13:01:39 EDT Ventricular Rate:  70 PR Interval:    QRS Duration: 126 QT Interval:  437 QTC Calculation: 472 R Axis:   66 Text Interpretation:  Atrial flutter with predominant 4:1 AV block IVCD,  consider atypical LBBB Rate slower Confirmed by Manus GunningANCOUR  MD, Shironda Kain  [phone]([id number]) on 09/25/2014 1:10:13 PM      MDM   Final diagnoses:  Atrial fibrillation with RVR   palpitations and shortness of breath that onset yesterday. Similar admission 2 weeks ago where wide complex tachycardia was felt to be an MAT versus atrial flutter.  Denies chest pain. Started on Cardizem drip. Discussed with Dr. Diona BrownerMcDowell who recommends adding amiodarone for better rate control given patient's frequent presentations with atrial fibrillation. He agrees she is not a good candidate for electrical cardioversion given her age and comorbidities. She is hemodynamically stable.  HR improved to 70-80s on cardizem and amiodarone gtt.  BReathing improved. Denies chest pain. Admission discussed with hospitalist.  CRITICAL CARE Performed by: Charlene OctaveANCOUR, Harpreet Pompey Total critical care time: 30 Critical care time was exclusive of separately billable procedures and treating other patients. Critical care was necessary to treat or prevent imminent or life-threatening deterioration. Critical care was time spent personally by me on the following activities: development of treatment plan with patient and/or surrogate as well as nursing, discussions with consultants, evaluation of patient's response to treatment, examination of patient, obtaining history from patient or surrogate, ordering and performing treatments and interventions, ordering and review of laboratory studies, ordering and review of radiographic studies, pulse oximetry and re-evaluation of patient's condition.      I personally performed the services described in this documentation, which was scribed in my presence. The  recorded information has been reviewed and is accurate.   Charlene OctaveStephen Katheryne Gorr, MD 09/25/14 [phone]

## 2014-09-25 NOTE — ED Notes (Signed)
Patient transferred to ICU 01. [name] and ICU tech in room at my departure. Cardizem and Amiodarone drips dosages verified by Alcario DroughtErica.

## 2014-09-25 NOTE — Consult Note (Signed)
Reason for Consult: Recurrent atrial fibrillation Referring Physician: ER Primary cardiologist: Dr. Satira Sark  [name] is an 81 y.o. female recently seen in the office 09/18/14 for post hospital follow-up after recent admission with atrial fibrillation with RVR. She converted to sinus rhythm with IV diltiazem and her Lopressor was increased to 100 mg in the morning 50 mg in the evening. Her lisinopril was decreased to 10 mg daily for soft blood pressures. She has a CHADSVASC score of 5 and Eliquis was continued. She had a slight bump in her troponins felt secondary to demand ischemia. She also has hypertension, chronic left bundle branch block, DM 2, chronic renal insufficiency. Last 2-D echo in 05/2014 EF 50-55% no wall motion abnormalities, grade 3 diastolic dysfunction, moderate MR. Patient was in normal sinus rhythm at her last encounter.  Her daughter called the office this morning reporting recurrent elevated heart rates in the 130s to 140s, also hypotension with systolic blood pressure in the 80s to 90s. This was prior to taking morning medications. Recommendation was for her to present to the ER for further assessment suspicion of recurrent atrial fibrillation.  ECG in the ER shows a wide-complex tachycardia with left bundle branch block pattern in the 140s, telemetry following the addition of intravenous diltiazem shows the rhythm to be probable atypical atrial flutter with variable conduction, heart rate slowing down into the 90s.  She reports compliance with her medications. Otherwise no major health changes. She has been under a lot of stress with her husband being ill.   Past Medical History  Diagnosis Date  . Hypothyroidism   . Hypertension   . Type 2 diabetes mellitus   . Palpitations     PAT/EAT/NSVT, normal LVEF  . Mitral regurgitation     Mild  . Anxiety   . Hyperlipidemia   . Depression   . Atrial fibrillation     Past Surgical History   Procedure Laterality Date  . None    . 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 hiatal hernia. Antral erosions status post gastric biopsy.  Venia Minks dilation N/A 04/17/2014    Procedure: Venia Minks DILATION;  Surgeon: Daneil Dolin, MD;  Location: [facility];  Service: Endoscopy;  Laterality: N/A;    Family History  Problem Relation Age of Onset  . Heart failure Mother     Died in her 61s  . Tuberculosis Father     Died at young age  . Cancer Brother   . Arthritis    . Colon cancer      Social History:  reports that she has never smoked. She has never used smokeless tobacco. She reports that she does not drink alcohol or use illicit drugs.  Allergies:  Allergies  Allergen Reactions  . Chocolate     Hives    Home Medications: No current facility-administered medications on file prior to encounter.   Current Outpatient Prescriptions on File Prior to Encounter  Medication Sig Dispense Refill  . albuterol (PROVENTIL HFA;VENTOLIN HFA) 108 (90 BASE) MCG/ACT inhaler Inhale 1 puff into the lungs every 6 (six) hours as needed for wheezing or shortness of breath.    . ALPRAZolam (XANAX) 0.25 MG tablet Take 0.25 mg by mouth at bedtime as needed for anxiety or sleep.     Marland Kitchen apixaban (ELIQUIS) 5 MG TABS tablet Take 1 tablet (5 mg total) by mouth  2 (two) times daily. 14 tablet 0  . atorvastatin (LIPITOR) 40 MG tablet Take 40 mg by mouth daily at 6 PM.    . cetirizine (ZYRTEC) 10 MG tablet Take 5-10 mg by mouth daily as needed for allergies or rhinitis.     Marland Kitchen docusate sodium (COLACE) 100 MG capsule Take 100 mg by mouth at bedtime as needed for mild constipation.     . ferrous sulfate 325 (65 FE) MG tablet Take 325 mg by mouth daily with breakfast.    . furosemide (LASIX) 20 MG tablet Take 1 tablet (20 mg total) by mouth daily. (Patient taking differently: Take 20 mg by mouth daily  as needed. ) 30 tablet 0  . glimepiride (AMARYL) 2 MG tablet Take 1 mg by mouth daily before breakfast.     . levothyroxine (SYNTHROID, LEVOTHROID) 25 MCG tablet Take 25 mcg by mouth daily before breakfast.    . lisinopril (PRINIVIL,ZESTRIL) 10 MG tablet Take 1 tablet (10 mg total) by mouth at bedtime. 30 tablet 0  . metoprolol (LOPRESSOR) 100 MG tablet Take 1 tablet (100 mg total) by mouth every morning. 30 tablet 0  . metoprolol (LOPRESSOR) 50 MG tablet Take 1 tablet (50 mg total) by mouth daily at 10 pm. 30 tablet 0  . omeprazole (PRILOSEC) 20 MG capsule Take 1 capsule by mouth every other day.     . potassium chloride SA (K-DUR,KLOR-CON) 20 MEQ tablet Take 20 mEq by mouth daily as needed.        Results for orders placed or performed during the hospital encounter of 09/25/14 (from the past 48 hour(s))  CBC with Differential/Platelet     Status: None   Collection Time: 09/25/14 10:30 AM  Result Value Ref Range   WBC 5.7 4.0 - 10.5 K/uL   RBC 4.23 3.87 - 5.11 MIL/uL   Hemoglobin 12.1 12.0 - 15.0 g/dL   HCT 37.2 36.0 - 46.0 %   MCV 87.9 78.0 - 100.0 fL   MCH 28.6 26.0 - 34.0 pg   MCHC 32.5 30.0 - 36.0 g/dL   RDW 14.8 11.5 - 15.5 %   Platelets 292 150 - 400 K/uL   Neutrophils Relative % 67 43 - 77 %   Neutro Abs 3.8 1.7 - 7.7 K/uL   Lymphocytes Relative 26 12 - 46 %   Lymphs Abs 1.5 0.7 - 4.0 K/uL   Monocytes Relative 6 3 - 12 %   Monocytes Absolute 0.3 0.1 - 1.0 K/uL   Eosinophils Relative 0 0 - 5 %   Eosinophils Absolute 0.0 0.0 - 0.7 K/uL   Basophils Relative 1 0 - 1 %   Basophils Absolute 0.0 0.0 - 0.1 K/uL  Comprehensive metabolic panel     Status: Abnormal   Collection Time: 09/25/14 10:30 AM  Result Value Ref Range   Sodium 137 135 - 145 mmol/L   Potassium 3.5 3.5 - 5.1 mmol/L   Chloride 106 101 - 111 mmol/L   CO2 21 (L) 22 - 32 mmol/L   Glucose, Bld 143 (H) 70 - 99 mg/dL   BUN 17 6 - 20 mg/dL   Creatinine, Ser 0.90 0.44 - 1.00 mg/dL   Calcium 9.0 8.9 - 10.3 mg/dL    Total Protein 7.2 6.5 - 8.1 g/dL   Albumin 3.6 3.5 - 5.0 g/dL   AST 21 15 - 41 U/L   ALT 16 14 - 54 U/L   Alkaline Phosphatase 79 38 - 126 U/L   Total Bilirubin  1.1 0.3 - 1.2 mg/dL   GFR calc non Af Amer 57 (L) >60 mL/min   GFR calc Af Amer >60 >60 mL/min    Comment: (NOTE) The eGFR has been calculated using the CKD EPI equation. This calculation has not been validated in all clinical situations. eGFR's persistently <90 mL/min signify possible Chronic Kidney Disease.    Anion gap 10 5 - 15  Protime-INR     Status: Abnormal   Collection Time: 09/25/14 10:30 AM  Result Value Ref Range   Prothrombin Time 17.9 (H) 11.6 - 15.2 seconds   INR 1.47 0.00 - 1.49  Troponin I     Status: Abnormal   Collection Time: 09/25/14 10:30 AM  Result Value Ref Range   Troponin I 0.11 (H) <0.031 ng/mL    Comment:        PERSISTENTLY INCREASED TROPONIN VALUES IN THE RANGE OF 0.04-0.49 ng/mL CAN BE SEEN IN:       -UNSTABLE ANGINA       -CONGESTIVE HEART FAILURE       -MYOCARDITIS       -CHEST TRAUMA       -ARRYHTHMIAS       -LATE PRESENTING MYOCARDIAL INFARCTION       -COPD   CLINICAL FOLLOW-UP RECOMMENDED.   Urinalysis, Routine w reflex microscopic     Status: Abnormal   Collection Time: 09/25/14 12:00 PM  Result Value Ref Range   Color, Urine YELLOW YELLOW   APPearance CLEAR CLEAR   Specific Gravity, Urine 1.015 1.005 - 1.030   pH 5.5 5.0 - 8.0   Glucose, UA NEGATIVE NEGATIVE mg/dL   Hgb urine dipstick NEGATIVE NEGATIVE   Bilirubin Urine NEGATIVE NEGATIVE   Ketones, ur NEGATIVE NEGATIVE mg/dL   Protein, ur NEGATIVE NEGATIVE mg/dL   Urobilinogen, UA 0.2 0.0 - 1.0 mg/dL   Nitrite NEGATIVE NEGATIVE   Leukocytes, UA SMALL (A) NEGATIVE  Urine microscopic-add on     Status: Abnormal   Collection Time: 09/25/14 12:00 PM  Result Value Ref Range   Squamous Epithelial / LPF FEW (A) RARE   WBC, UA 3-6 <3 WBC/hpf    Dg Chest Portable 1 View  09/25/2014   CLINICAL DATA:  81 year old female  with shortness of breath since yesterday, not relieved by inhaler. Initial encounter.  EXAM: PORTABLE CHEST - 1 VIEW  COMPARISON:  09/09/2014 and earlier.  FINDINGS: Portable AP supine view at 1041 hours. Stable cardiac size and mediastinal contours. Stable lung volumes. No pneumothorax, pleural effusion or consolidation. Pulmonary vascularity appears decreased. No acute edema. Stable visualized osseous structures.  IMPRESSION: No acute cardiopulmonary abnormality.   Electronically Signed   By: Genevie Ann M.D.   On: 09/25/2014 10:53    ROS  Complete review of systems obtained. Patient reports shortness of breath over the last few days, presumably coinciding with return of atrial arrhythmia. No chest pain. No bleeding problems on Eliquis. No syncope. Otherwise negative.  Blood pressure 117/50, pulse 62, temperature 98.1 F (36.7 C), temperature source Oral, resp. rate 21, height $RemoveBe'5\' 6"'ilUnUkjua$  (1.676 m), weight 167 lb (75.751 kg), SpO2 97 %. Physical Exam PHYSICAL EXAM:   Elderly woman, no distress. HEENT: Conjunctiva and lids normal, oropharynx clear. Neck: Supple, no elevated JVP or carotid bruits, no thyromegaly. Lungs: Clear to auscultation, nonlabored breathing at rest. Cardiac: Irregularly irregular, no S3 or significant systolic murmur, no pericardial rub. Abdomen: Soft, nontender, bowel sounds present. Extremities: Chronic stasis with edema and lymphedema, distal pulses 2+. Skin: Warm and  dry. Musculoskeletal: No kyphosis. Neuropsychiatric: Alert and oriented x3, affect grossly appropriate.   Echocardiogram 07/14/2014: Study Conclusions  - Left ventricle: The cavity size was normal. Systolic function was normal. The estimated ejection fraction was in the range of 50% to 55%. Wall motion was normal; there were no regional wall motion abnormalities. There was a reduced contribution of atrial contraction to ventricular filling, due to increased ventricular diastolic pressure or atrial  contractile dysfunction. Doppler parameters are consistent with a reversible restrictive pattern, indicative of decreased left ventricular diastolic compliance and/or increased left atrial pressure (grade 3 diastolic dysfunction). - Ventricular septum: Septal motion showed paradox. These changes are consistent with intraventricular conduction delay. - Aortic valve: Trileaflet; normal thickness, mildly calcified leaflets. - Mitral valve: There was moderate regurgitation. - Left atrium: The atrium was mildly to moderately dilated. - Tricuspid valve: There was moderate regurgitation. - Pulmonary arteries: PA peak pressure: 34 mm Hg (S).   Impression:  1. Recurrent atrial arrhythmia with RVR, looks to be atypical atrial flutter at this time. Heart rate coming down on intravenous diltiazem. She has continued on beta blocker and Eliquis as an outpatient with CHADSVASC score of 5.   2. Essential hypertension, blood pressure currently stable in the ER.  3. Moderate mitral regurgitation by most recent echocardiogram in February, LVEF 50-55%.  4. History of diastolic heart failure, grade 3 diastolic dysfunction.   Recommendations:  Reviewed recent interval history (I last saw her in December 2015) and discussed the case with ER staff, patient and daughter present. Recommendation is that she be admitted to [facility] to the hospitalist team for further arrhythmia management. Amiodarone infusion is being started in the ER, continue IV diltiazem for rate control as well. I am hopeful that with the addition of antiarrhythmic therapy, she will maintain sinus rhythm more consistently. Continue Eliquis. We will follow with you.  Satira Sark, M.D., F.A.C.C.

## 2014-09-25 NOTE — ED Notes (Signed)
[facility]. All questions answered.

## 2014-09-25 NOTE — Telephone Encounter (Signed)
Patient's daughter informed. Daughter also informed that patient needed to fill out the DR to give us permission to speak with her about anything dealing with [facility].

## 2014-09-25 NOTE — H&P (Signed)
[facility] History and Physical  Charlene BoardsSophia M [name] JXB:[id number] DOB: 05/02/1927 DOA: 09/25/2014  Referring physician: Rancour PCP: Milana ObeyKNOWLTON,[name], MD   Chief Complaint: dyspnea  HPI: [name] is a delightful 81 y.o. female with a past medical history that includes hypothyroidism, hypertension, hyperlipidemia, A. fib, type 2 diabetes since to the emergency department with the chief complaint of sudden onset shortness of breath for the last 24 hours. Initial evaluation in the emergency department reveals atrial flutter with RVR. To reports that starting yesterday she became more short of breath particularly with exertion. She states that she took her pulse and it was approximately 135 she checked her blood pressure and she reports a systolic blood pressure in the 80s. She denies chest pain palpitation headache dizziness syncope or near-syncope. She denies fever chills recent travel or sick contacts. She does report stable orthopnea requiring one pillow. She denies any worsening lower extremity edema or cough. She reports not having taken her medications yet this morning. She called her cardiologist office and it was recommended that she come to the emergency department. Workup in the emergency department reveals an EKG with atrial flutter, chest x-ray with no acute cardiopulmonary process, complete blood count unremarkable basic metabolic panel unremarkable except for serum glucose of 142 initial troponin 0.11.  In the emergency department she is afebrile hemodynamically stable with a heart rate of 140. She is provided with Cardizem loading dose and Cardizem drip and her heart rate was in the 90s at the time of my exam. Also while in the emergency department she is evaluated by cardiology who recommended the addition of amiodarone infusion for further arrhythmia management.   Review of Systems:  10 point review of systems complete and all systems are negative except as indicated in  the history of present illness  Past Medical History  Diagnosis Date  . Hypothyroidism   . Hypertension   . Type 2 diabetes mellitus   . Palpitations     PAT/EAT/NSVT, normal LVEF  . Mitral regurgitation     Mild  . Anxiety   . Hyperlipidemia   . Depression   . Atrial fibrillation    Past Surgical History  Procedure Laterality Date  . None    . 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 hiatal hernia. Antral erosions status post gastric biopsy.  [name]. [name] dilation N/A 04/17/2014    Procedure: Charlene HashimotoMALONEY DILATION;  Surgeon: Corbin Adeobert M Rourk, MD;  Location: [facility];  Service: Endoscopy;  Laterality: N/A;   Social History:  reports that she has never smoked. She has never used smokeless tobacco. She reports that she does not drink alcohol or use illicit drugs. She lives at home with her husband. She is independent with ADLs Allergies  Allergen Reactions  . Chocolate     Hives    Family History  Problem Relation Age of Onset  . Heart failure Mother     Died in her 170s  . Tuberculosis Father     Died at young age  . Cancer Brother   . Arthritis    . Colon cancer       Prior to Admission medications   Medication Sig Start Date End Date Taking? Authorizing Provider  albuterol (PROVENTIL HFA;VENTOLIN HFA) 108 (90 BASE) MCG/ACT inhaler Inhale 1 puff into the lungs every 6 (six) hours as needed for wheezing or shortness of breath.   Yes Historical Provider,  MD  ALPRAZolam (XANAX) 0.25 MG tablet Take 0.25 mg by mouth at bedtime as needed for anxiety or sleep.    Yes Historical Provider, MD  apixaban (ELIQUIS) 5 MG TABS tablet Take 1 tablet (5 mg total) by mouth 2 (two) times daily. 08/12/14  Yes Jonelle Sidle, MD  atorvastatin (LIPITOR) 40 MG tablet Take 40 mg by mouth daily at 6 PM. 09/27/11  Yes Jonelle Sidle, MD  cetirizine (ZYRTEC) 10 MG tablet Take 5-10 mg by  mouth daily as needed for allergies or rhinitis.    Yes Historical Provider, MD  ferrous sulfate 325 (65 FE) MG tablet Take 325 mg by mouth daily with breakfast.   Yes Historical Provider, MD  furosemide (LASIX) 20 MG tablet Take 1 tablet (20 mg total) by mouth daily. Patient taking differently: Take 20 mg by mouth daily as needed.  09/11/14  Yes Maryann Mikhail, DO  glimepiride (AMARYL) 2 MG tablet Take 1 mg by mouth daily before breakfast.    Yes Historical Provider, MD  levothyroxine (SYNTHROID, LEVOTHROID) 25 MCG tablet Take 25 mcg by mouth daily before breakfast.   Yes Historical Provider, MD  lisinopril (PRINIVIL,ZESTRIL) 10 MG tablet Take 1 tablet (10 mg total) by mouth at bedtime. 09/11/14  Yes Maryann Mikhail, DO  metoprolol (LOPRESSOR) 100 MG tablet Take 1 tablet (100 mg total) by mouth every morning. 09/12/14  Yes Maryann Mikhail, DO  metoprolol (LOPRESSOR) 50 MG tablet Take 1 tablet (50 mg total) by mouth daily at 10 pm. 09/11/14  Yes Maryann Mikhail, DO  omeprazole (PRILOSEC) 20 MG capsule Take 1 capsule by mouth every other day.  09/06/14  Yes Historical Provider, MD  polyethylene glycol (MIRALAX / GLYCOLAX) packet Take 17 g by mouth daily as needed for mild constipation or moderate constipation.   Yes Historical Provider, MD  potassium chloride SA (K-DUR,KLOR-CON) 20 MEQ tablet Take 20 mEq by mouth daily as needed (when taking furosemide).    Yes Historical Provider, MD   Physical Exam: Filed Vitals:   09/25/14 1145 09/25/14 1215 09/25/14 1230 09/25/14 1301  BP: 117/50 125/59 127/52 128/58  Pulse:  65 66 67  Temp:      TempSrc:      Resp: Height:      Weight:      SpO2:  100% 100% 100%    Wt Readings from Last 3 Encounters:  09/25/14 75.751 kg (167 lb)  09/18/14 76.114 kg (167 lb 12.8 oz)  09/10/14 75.1 kg (165 lb 9.1 oz)    General:  Appears calm and comfortable Eyes: PERRL, normal lids, irises & conjunctiva ENT: grossly normal hearing, lips & tongue Neck:  no LAD, masses or thyromegaly Cardiovascular: Irregularly irregular, no m/r/g. Trace lower extremity edema  Respiratory: CTA bilaterally, no w/r/r. Normal respiratory effort. Abdomen: soft, ntnd Skin: no rash or induration seen on limited exam Musculoskeletal: grossly normal tone BUE/BLE Psychiatric: grossly normal mood and affect, speech fluent and appropriate Neurologic: grossly non-focal. Speech clear facial symmetry           Labs on Admission:  Basic Metabolic Panel:  Recent Labs Lab 09/25/14 1030  NA 137  K 3.5  CL 106  CO2 21*  GLUCOSE 143*  BUN 17  CREATININE 0.90  CALCIUM 9.0   Liver Function Tests:  Recent Labs Lab 09/25/14 1030  AST 21  ALT 16  ALKPHOS 79  BILITOT 1.1  PROT 7.2  ALBUMIN 3.6   No results for input(s): LIPASE,  AMYLASE in the last 168 hours. No results for input(s): AMMONIA in the last 168 hours. CBC:  Recent Labs Lab 09/25/14 1030  WBC 5.7  NEUTROABS 3.8  HGB 12.1  HCT 37.2  MCV 87.9  PLT 292   Cardiac Enzymes:  Recent Labs Lab 09/25/14 1030  TROPONINI 0.11*    BNP (last 3 results)  Recent Labs  09/10/14 0011  BNP 265.0*    ProBNP (last 3 results) No results for input(s): PROBNP in the last 8760 hours.  CBG: No results for input(s): GLUCAP in the last 168 hours.  Radiological Exams on Admission: Dg Chest Portable 1 View  09/25/2014   CLINICAL DATA:  81 year old female with shortness of breath since yesterday, not relieved by inhaler. Initial encounter.  EXAM: PORTABLE CHEST - 1 VIEW  COMPARISON:  09/09/2014 and earlier.  FINDINGS: Portable AP supine view at 1041 hours. Stable cardiac size and mediastinal contours. Stable lung volumes. No pneumothorax, pleural effusion or consolidation. Pulmonary vascularity appears decreased. No acute edema. Stable visualized osseous structures.  IMPRESSION: No acute cardiopulmonary abnormality.   Electronically Signed   By: Odessa FlemingH  Hall M.D.   On: 09/25/2014 10:53    EKG:  Independently reviewed. Aflutter with 4:1 AV block  Assessment/Plan Principal Problem:   Atrial flutter with rapid ventricular response: Will admit to step down unit. Will continue Cardizem and amiodarone drip. At the time of my exam heart rate was in the 90s. Will cycle troponin. Home medications include Lopressor. Will hold this for now. CHADSVASC score 5. She's also on Eliquis will continue this. Evaluated by cardiology in the emergency department. Appreciate their input  Active Problems: Chronic diastolic heart failure: Echo done February of this year reveals an EF of 50-55% with grade 3 diastolic dysfunction, mitral valve with moderate regurg. She reports taking Lasix on an as-needed basis based on lower extremity edema and weight. She is on a beta blocker at home we will hold this for now. Will monitor intake and output and obtain daily weights.    Essential hypertension, benign: Reports hypotension this morning. Her pressures controlled while in the emergency department. Home medications include lisinopril and Toprol. I will hold these for now. Monitor and resume when indicated  Dyspnea on exertion: Likely related to #1 and #2. Much improved at the time of my exam. Oxygen saturation levels greater than 90%. He does use an inhaler at home as needed. Will continue this.  Diabetes: On oral agents at home. Will continue this. Obtain a hemoglobin A1c. Use sliding scale insulin for optimal control.    Hypothyroidism: Recent TSH 2.1. Will continue her home Synthroid.    Dr Diona BrownerMcDowell cardiology          Code Status: limited no CPR or intubation DVT Prophylaxis: Family Communication: daughter at bedside Disposition Plan: home when ready Time spent: 60 minutes  [facility]lmsted Medical CenterBLACK,KAREN M [facility] Pager [phone]

## 2014-09-26 DIAGNOSIS — I10 Essential (primary) hypertension: Secondary | ICD-10-CM | POA: Diagnosis not present

## 2014-09-26 DIAGNOSIS — I4892 Unspecified atrial flutter: Secondary | ICD-10-CM | POA: Diagnosis not present

## 2014-09-26 DIAGNOSIS — R001 Bradycardia, unspecified: Secondary | ICD-10-CM

## 2014-09-26 DIAGNOSIS — E039 Hypothyroidism, unspecified: Secondary | ICD-10-CM | POA: Diagnosis not present

## 2014-09-26 DIAGNOSIS — I5032 Chronic diastolic (congestive) heart failure: Secondary | ICD-10-CM | POA: Diagnosis not present

## 2014-09-26 LAB — BASIC METABOLIC PANEL WITH GFR
Anion gap: 7 (ref 5–15)
BUN: 14 mg/dL (ref 6–20)
CO2: 24 mmol/L (ref 22–32)
Calcium: 8.8 mg/dL — ABNORMAL LOW (ref 8.9–10.3)
Chloride: 108 mmol/L (ref 101–111)
Creatinine, Ser: 0.81 mg/dL (ref 0.44–1.00)
GFR calc Af Amer: >60 mL/min
GFR calc non Af Amer: >60 mL/min
Glucose, Bld: 124 mg/dL — ABNORMAL HIGH (ref 70–99)
Potassium: 3.9 mmol/L (ref 3.5–5.1)
Sodium: 139 mmol/L (ref 135–145)

## 2014-09-26 LAB — GLUCOSE, CAPILLARY
GLUCOSE-CAPILLARY: 124 mg/dL — AB (ref 70–99)
GLUCOSE-CAPILLARY: 98 mg/dL (ref 70–99)
Glucose-Capillary: 111 mg/dL — ABNORMAL HIGH (ref 70–99)
Glucose-Capillary: 124 mg/dL — ABNORMAL HIGH (ref 70–99)

## 2014-09-26 LAB — TROPONIN I: Troponin I: 0.17 ng/mL — ABNORMAL HIGH

## 2014-09-26 MED ORDER — AMIODARONE HCL 200 MG PO TABS
200.0000 mg | ORAL_TABLET | Freq: Two times a day (BID) | ORAL | Status: DC
  Administered 2014-09-26 – 2014-09-27 (×3): 200 mg via ORAL
  Filled 2014-09-26 (×3): qty 1

## 2014-09-26 NOTE — Progress Notes (Signed)
Patient: [name] / Admit Date: 09/25/2014 / Date of Encounter: 09/26/2014, 8:45 AM  Primary cardiologist: Dr. Jonelle SidleSamuel G. McDowell  Subjective: Feeling better. No acute complaints.   Objective: Telemetry: converted to NSR/SB yesterday, occasional HR dips to the 40s (as low as 43 overnight), occasional PACs Physical Exam: Blood pressure 115/46, pulse 54, temperature 97.5 F (36.4 C), temperature source Oral, resp. rate 14, height 5\' 6"  (1.676 m), weight 166 lb 14.2 oz (75.7 kg), SpO2 100 %. General: Well developed, well nourished F in no acute distress. Head: JVP not elevated. Lungs: Clear bilaterally to auscultation without wheezes, rales, or rhonchi. Breathing is unlabored. Heart: RRR occasional ectopy S1 S2 without murmurs, rubs, or gallops.  Abdomen: Soft, non-tender, non-distended with normoactive bowel sounds. No rebound/guarding. Extremities: No clubbing or cyanosis. No edema. Distal pedal pulses are 2+ and equal bilaterally. Neuro: Alert and oriented X 3. Moves all extremities spontaneously. Psych:  Responds to questions appropriately with a normal affect.   Intake/Output Summary (Last 24 hours) at 09/26/14 0845 Last data filed at 09/26/14 0630  Gross per 24 hour  Intake 975.68 ml  Output    700 ml  Net 275.68 ml    Inpatient Medications:  . apixaban  5 mg Oral BID  . atorvastatin  40 mg Oral q1800  . ferrous sulfate  325 mg Oral Q breakfast  . glimepiride  1 mg Oral QAC breakfast  . insulin aspart  0-15 Units Subcutaneous TID WC  . insulin aspart  0-5 Units Subcutaneous QHS  . levothyroxine  25 mcg Oral QAC breakfast  . loratadine  10 mg Oral Daily  . pantoprazole  40 mg Oral Daily  . sodium chloride  3 mL Intravenous Q12H  . sodium chloride  3 mL Intravenous Q12H   Infusions:  . amiodarone 30 mg/hr (09/26/14 0732)  . diltiazem (CARDIZEM) infusion 5 mg/hr (09/25/14 2153)     Labs:  Recent Labs  09/25/14 1030 09/26/14 0417  NA 137 139  K 3.5 3.9  CL  106 108  CO2 21* 24  GLUCOSE 143* 124*  BUN 17 14  CREATININE 0.90 0.81  CALCIUM 9.0 8.8*    Recent Labs  09/25/14 1030  AST 21  ALT 16  ALKPHOS 79  BILITOT 1.1  PROT 7.2  ALBUMIN 3.6    Recent Labs  09/25/14 1030  WBC 5.7  NEUTROABS 3.8  HGB 12.1  HCT 37.2  MCV 87.9  PLT 292    Recent Labs  09/25/14 1030 09/25/14 1847 09/26/14 0417  TROPONINI 0.11* 0.18* 0.17*   Invalid input(s): POCBNP No results for input(s): HGBA1C in the last 72 hours.   Radiology/Studies:  Dg Chest Portable 1 View  09/25/2014   CLINICAL DATA:  81 year old female with shortness of breath since yesterday, not relieved by inhaler. Initial encounter.  EXAM: PORTABLE CHEST - 1 VIEW  COMPARISON:  09/09/2014 and earlier.  FINDINGS: Portable AP supine view at 1041 hours. Stable cardiac size and mediastinal contours. Stable lung volumes. No pneumothorax, pleural effusion or consolidation. Pulmonary vascularity appears decreased. No acute edema. Stable visualized osseous structures.  IMPRESSION: No acute cardiopulmonary abnormality.   Electronically Signed   By: Odessa FlemingH  Hall M.D.   On: 09/25/2014 10:53   Dg Chest Portable 1 View  09/09/2014   CLINICAL DATA:  Tachycardia  EXAM: PORTABLE CHEST - 1 VIEW  COMPARISON:  07/13/2014 CT  FINDINGS: Heart size mildly enlarged. Scattered atherosclerosis of the aortic arch. Mild interstitial prominence. No overt effusion. No  pneumothorax. Multilevel degenerative change.  IMPRESSION: Mild interstitial prominence can be seen in the setting of interstitial edema, atypical/viral infection, or pneumonitis.   Electronically Signed   By: Jearld LeschAndrew  DelGaizo M.D.   On: 09/09/2014 23:02     Assessment and Plan   1. Recurrent atrial arrhythmia - h/o PAF, now with atrial flutter this admission - converted to NSR on telemetry on amiodarone. Occasional HR dipping to the low 40s. Now off diltiazem. Will d/c IV amiodarone and change to oral form, 200mg  BID. Baseline LFTs, TSH OK recently.  We will watch HR to determine if we can add back any AVN blocking agents (i.e. metoprolol, as she was taking at home). Continue apixaban.   2. Probable component of tachy-brady syndrome - while in NSR she has shown HRs in the low 40s at times, currently in the 60s. Not clearly symptomatic with the low HRs. This may pose a challenge with rate control for her atrial arrhythmias. See above.  3. Essential HTN with hypotension on admission - stable now.  4. Mod MR with EF 50-55%, grade 3 DD - no acute sx of CHF.  OK to transfer to floor from our standpoint.  SignedRonie Spies, [name] [name] Pager: [phone]   Attending note:  Patient seen and examined. Discussed the case with [name]. [name] has converted to sinus rhythm with PACs from a atypical atrial flutter on amiodarone. As outlined above, she has had bradycardia into the 40s and intravenous diltiazem has been discontinued. On examination today she is comfortable, lungs are clear and nonlabored, cardiac exam reveals largely regular rhythm with occasional ectopic beats. Our plan at this time is to discontinue intravenous amiodarone and switch to oral dose 200 mg twice daily. She will also continue on Eliquis. She can be transferred out to telemetry, will continue to follow heart rate prior to determining whether we will resume lower dose beta blocker for an outpatient regimen. Possibly discharge home tomorrow.  Jonelle SidleSamuel G. McDowell, M.D., F.A.C.C.

## 2014-09-26 NOTE — Progress Notes (Signed)
Pt a/o. Saline lock patent. Vss. No complaints of any distress. Family visiting at the bedside. Report called to J.Mays, Charity fundraiserN. Pt transferred to room 301 via wheelchair.

## 2014-09-26 NOTE — Progress Notes (Addendum)
MEDICATION RELATED CONSULT NOTE - INITIAL   Pharmacy Consult for Possible Drug Interactions w/ Amiodarone Indication: amiodarone started this admission  Allergies  Allergen Reactions  . Chocolate     Hives   Patient Measurements: Height: 5\' 6"  (167.6 cm) Weight: 166 lb 14.2 oz (75.7 kg) IBW/kg (Calculated) : 59.3  Vital Signs: Temp: 97.7 F (36.5 C) (05/05 1100) Temp Source: Oral (05/05 1100) BP: 143/55 mmHg (05/05 1030) Pulse Rate: 69 (05/05 0745) Intake/Output from previous day: 05/04 0701 - 05/05 0700 In: 975.7 [I.V.:975.7] Out: 700 [Urine:700] Intake/Output from this shift: Total I/O In: -  Out: 300 [Urine:300]  Labs:  Recent Labs  09/25/14 1030 09/26/14 0417  WBC 5.7  --   HGB 12.1  --   HCT 37.2  --   PLT 292  --   CREATININE 0.90 0.81  ALBUMIN 3.6  --   PROT 7.2  --   AST 21  --   ALT 16  --   ALKPHOS 79  --   BILITOT 1.1  --    Estimated Creatinine Clearance: 52.8 mL/min (by C-G formula based on Cr of 0.81).  Medical History: Past Medical History  Diagnosis Date  . Hypothyroidism   . Hypertension   . Type 2 diabetes mellitus   . Palpitations     PAT/EAT/NSVT, normal LVEF  . Mitral regurgitation     Mild  . Anxiety   . Hyperlipidemia   . Depression   . Atrial fibrillation    Medications:  Scheduled:  . amiodarone  200 mg Oral BID  . apixaban  5 mg Oral BID  . atorvastatin  40 mg Oral q1800  . ferrous sulfate  325 mg Oral Q breakfast  . glimepiride  1 mg Oral QAC breakfast  . insulin aspart  0-15 Units Subcutaneous TID WC  . insulin aspart  0-5 Units Subcutaneous QHS  . levothyroxine  25 mcg Oral QAC breakfast  . loratadine  10 mg Oral Daily  . pantoprazole  40 mg Oral Daily  . sodium chloride  3 mL Intravenous Q12H  . sodium chloride  3 mL Intravenous Q12H   POSSIBLE DRUG INTERACTIONS:   ALPRAZOLAM: combo may increase benzo levels, risk of CNS depression and psychomotor impairment  ATORVASTATIN:  Consider decrease atorvastatin  dose; combo may increase statin levels, risk of myopathy and rhabdomyolysis  ONDANSETRON:  Combo may increase ondansetron levels and risk of adverse effects; may increase risk of QT prolongation, cardiac arrhythmias.  Consider using an alternative to Zofran  TRAZODONE:  Combo may increase trazodone levels and risk of QT prolongation, cardiac arrhythmias, hypotension and syncope.  ALBUTEROL:  Monitor K+; combo may increase risk of QT prolongation and arrhythmias.  GLIMEPIRIDE:  Monitor glucose; combo may increase sulfonylurea levels, risk of hypoglycemia.  SYNTHROID:  Monitor thyroid fxn, adjust thyroid replacement Rx if needed  ELIQUIS:  Caution advised:  May increase levels of Apixaban and risk of bleeding  LORATIDINE:  Caution advised:  May increase loratadine levels and risk of adverse effects including QT prolongation and cardiac arrhythmias.    RECOMMENDATIONS:    Monitor QT interval and for any s/sx of adverse effects as noted above.  Consider reducing dose of Apixaban (Eliquis) to 2.5mg  PO BID  Consider reduce dose or d/c medications that may prolong QT interval (d/c meds if not medically necessary)  Monitor thyroid fxn and glucose  Margo AyeHall, Addilynn Mowrer A 09/26/2014,1:48 PM

## 2014-09-26 NOTE — Progress Notes (Signed)
[facility] PROGRESS NOTE  Charlene HeadsSophia M [name] NWG:[id number] DOB: 04/12/1927 DOA: 09/25/2014 PCP: Charlene ObeyKNOWLTON,[name], MD  Assessment/Plan: 1. Rapid atrial flutter. Patient was initially admitted to the stepdown unit and started on Cardizem and amiodarone infusions. Overnight, she converted to sinus rhythm and became bradycardic with a heart rate in the 40s. Cardizem infusion has been discontinued. Cardiology has followed the patient and has transitioned her from amiodarone infusion to oral amiodarone. She'll need continued telemetry monitoring. She is already anticoagulated with Eliquis for a CHADSVASC score of 5. 2. Chronic diastolic congestive heart failure. Appears compensated at this time. Continue to follow. 3. Hypertension. Appears to be stable. Currently not receiving her home doses of lisinopril and Toprol. We'll be ordering these as her blood pressure tolerates. 4. Dyspnea on exertion. Likely related to #1. Appears to be improving. 5. Diabetes. Continue sliding scale insulin. Blood sugar stable. 6. Hypothyroidism. Recent TSH is normal. Continue Synthroid.  Code Status: Partial code- No CPR, No intubation Family Communication: no family present Disposition Plan: discharge home once improved   Consultants:  cardiology  Procedures:    Antibiotics:    HPI/Subjective: Feeling better, shortness of breath has improved, no chest pain  Objective: Filed Vitals:   09/26/14 1030  BP: 143/55  Pulse:   Temp:   Resp: 18    Intake/Output Summary (Last 24 hours) at 09/26/14 1113 Last data filed at 09/26/14 1045  Gross per 24 hour  Intake 975.68 ml  Output   1000 ml  Net -24.32 ml   Filed Weights   09/25/14 1030 09/25/14 1830 09/26/14 0500  Weight: 75.751 kg (167 lb) 74.6 kg (164 lb 7.4 oz) 75.7 kg (166 lb 14.2 oz)    Exam:   General:  NAD  Cardiovascular: S1, S2 RRR  Respiratory: CTA B  Abdomen: soft, nt, nd, bs+  Musculoskeletal: no edema b/l   Data  Reviewed: Basic Metabolic Panel:  Recent Labs Lab 09/25/14 1030 09/26/14 0417  NA 137 139  K 3.5 3.9  CL 106 108  CO2 21* 24  GLUCOSE 143* 124*  BUN 17 14  CREATININE 0.90 0.81  CALCIUM 9.0 8.8*   Liver Function Tests:  Recent Labs Lab 09/25/14 1030  AST 21  ALT 16  ALKPHOS 79  BILITOT 1.1  PROT 7.2  ALBUMIN 3.6   No results for input(s): LIPASE, AMYLASE in the last 168 hours. No results for input(s): AMMONIA in the last 168 hours. CBC:  Recent Labs Lab 09/25/14 1030  WBC 5.7  NEUTROABS 3.8  HGB 12.1  HCT 37.2  MCV 87.9  PLT 292   Cardiac Enzymes:  Recent Labs Lab 09/25/14 1030 09/25/14 1847 09/26/14 0417  TROPONINI 0.11* 0.18* 0.17*   BNP (last 3 results)  Recent Labs  09/10/14 0011  BNP 265.0*    ProBNP (last 3 results) No results for input(s): PROBNP in the last 8760 hours.  CBG:  Recent Labs Lab 09/25/14 2114 09/26/14 0719  GLUCAP 134* 111*    Recent Results (from the past 240 hour(s))  MRSA PCR Screening     Status: None   Collection Time: 09/25/14  5:51 PM  Result Value Ref Range Status   MRSA by PCR NEGATIVE NEGATIVE Final    Comment:        The GeneXpert MRSA Assay (FDA approved for NASAL specimens only), is one component of a comprehensive MRSA colonization surveillance program. It is not intended to diagnose MRSA infection nor to guide or monitor treatment for MRSA infections.  Studies: Dg Chest Portable 1 View  09/25/2014   CLINICAL DATA:  81 year old female with shortness of breath since yesterday, not relieved by inhaler. Initial encounter.  EXAM: PORTABLE CHEST - 1 VIEW  COMPARISON:  09/09/2014 and earlier.  FINDINGS: Portable AP supine view at 1041 hours. Stable cardiac size and mediastinal contours. Stable lung volumes. No pneumothorax, pleural effusion or consolidation. Pulmonary vascularity appears decreased. No acute edema. Stable visualized osseous structures.  IMPRESSION: No acute cardiopulmonary  abnormality.   Electronically Signed   By: [name] M.[name].   On: 09/25/2014 10:53    Scheduled Meds: . amiodarone  200 mg Oral BID  . apixaban  5 mg Oral BID  . atorvastatin  40 mg Oral q1800  . ferrous sulfate  325 mg Oral Q breakfast  . glimepiride  1 mg Oral QAC breakfast  . insulin aspart  0-15 Units Subcutaneous TID WC  . insulin aspart  0-5 Units Subcutaneous QHS  . levothyroxine  25 mcg Oral QAC breakfast  . loratadine  10 mg Oral Daily  . pantoprazole  40 mg Oral Daily  . sodium chloride  3 mL Intravenous Q12H  . sodium chloride  3 mL Intravenous Q12H   Continuous Infusions: . diltiazem (CARDIZEM) infusion 5 mg/hr (09/25/14 2153)    Principal Problem:   Atrial flutter with rapid ventricular response Active Problems:   Essential hypertension, benign   Mitral regurgitation   Diabetes   Hypothyroidism   Atrial fibrillation with RVR   Dyspnea on exertion   Chronic diastolic heart failure   Sinus bradycardia    Time spent: 25mins    [facility] Alabama Regional HospitalMEMON,Charlene Sorbello  [facility] Pager [phone]. If 7PM-7AM, please contact night-coverage at [email or web address], password [email or web address] 09/26/2014, 11:13 AM  LOS: 1 day

## 2014-09-26 NOTE — Care Management Note (Signed)
Case Management Note  Patient Details  Name: Bartholomew BoardsSophia M Fagerstrom MRN: [id number] Date of Birth: 1926-08-15    Expected Discharge Date:  09/27/14               Expected Discharge Plan:  Home/Self Care  In-House Referral:  NA  Discharge planning Services  CM Consult  Post Acute Care Choice:  NA Choice offered to:  NA  DME Arranged:    DME Agency:     HH Arranged:    HH Agency:     Status of Service:  Completed, signed off  Medicare Important Message Given:    Date Medicare IM Given:    Medicare IM give by:    Date Additional Medicare IM Given:    Additional Medicare Important Message give by:     If discussed at Long Length of Stay Meetings, dates discussed:    Additional Comments:  Pt is from home, lives with husband and has strong support from daughters. Pt has no HH services or DME need prior to admission. Pt plans to return home with self care and family support. Pt asked that I speak to her daughters, will go see them when they visit this afternoon. MD anticipates DC in the morning. No further CM needs.  Malcolm Metrohildress, Iyanni Hepp Demske, RN 09/26/2014, 11:15 AM

## 2014-09-26 NOTE — Progress Notes (Signed)
Pt heart rate in 40s. BP 97/36. Cardizem drip put on hold for now. Amiodarone continues to run at this time.  Continue to monitor heart rate closely

## 2014-09-27 DIAGNOSIS — E039 Hypothyroidism, unspecified: Secondary | ICD-10-CM | POA: Diagnosis not present

## 2014-09-27 DIAGNOSIS — I340 Nonrheumatic mitral (valve) insufficiency: Secondary | ICD-10-CM | POA: Diagnosis not present

## 2014-09-27 DIAGNOSIS — I5032 Chronic diastolic (congestive) heart failure: Secondary | ICD-10-CM | POA: Diagnosis not present

## 2014-09-27 DIAGNOSIS — I10 Essential (primary) hypertension: Secondary | ICD-10-CM | POA: Diagnosis not present

## 2014-09-27 DIAGNOSIS — I4892 Unspecified atrial flutter: Secondary | ICD-10-CM | POA: Diagnosis not present

## 2014-09-27 LAB — BASIC METABOLIC PANEL
ANION GAP: 8 (ref 5–15)
BUN: 9 mg/dL (ref 6–20)
CHLORIDE: 107 mmol/L (ref 101–111)
CO2: 24 mmol/L (ref 22–32)
Calcium: 8.7 mg/dL — ABNORMAL LOW (ref 8.9–10.3)
Creatinine, Ser: 0.7 mg/dL (ref 0.44–1.00)
GFR calc Af Amer: >60 mL/min (ref >60)
GFR calc non Af Amer: >60 mL/min (ref >60)
GLUCOSE: 117 mg/dL — AB (ref 70–99)
Potassium: 4 mmol/L (ref 3.5–5.1)
SODIUM: 139 mmol/L (ref 135–145)

## 2014-09-27 LAB — GLUCOSE, CAPILLARY: Glucose-Capillary: 119 mg/dL — ABNORMAL HIGH (ref 70–99)

## 2014-09-27 LAB — HEMOGLOBIN A1C
Hgb A1c MFr Bld: 6.1 % — ABNORMAL HIGH (ref 4.8–5.6)
MEAN PLASMA GLUCOSE: 128 mg/dL

## 2014-09-27 MED ORDER — FUROSEMIDE 20 MG PO TABS: 20.0000 mg | ORAL_TABLET | Freq: Every day | ORAL | Status: DC | PRN

## 2014-09-27 MED ORDER — APIXABAN 2.5 MG PO TABS: 2.5000 mg | ORAL_TABLET | Freq: Two times a day (BID) | ORAL | Status: DC

## 2014-09-27 MED ORDER — APIXABAN 5 MG PO TABS
2.5000 mg | ORAL_TABLET | Freq: Two times a day (BID) | ORAL | Status: DC
Start: 2014-09-27 — End: 2014-09-27
  Administered 2014-09-27: 2.5 mg via ORAL
  Filled 2014-09-27: qty 1

## 2014-09-27 MED ORDER — METOPROLOL TARTRATE 25 MG PO TABS
25.0000 mg | ORAL_TABLET | Freq: Two times a day (BID) | ORAL | Status: DC
  Administered 2014-09-27: 25 mg via ORAL
  Filled 2014-09-27: qty 1

## 2014-09-27 MED ORDER — AMIODARONE HCL 200 MG PO TABS: 200.0000 mg | ORAL_TABLET | Freq: Two times a day (BID) | ORAL | Status: DC

## 2014-09-27 MED ORDER — METOPROLOL TARTRATE 25 MG PO TABS: 25.0000 mg | ORAL_TABLET | Freq: Two times a day (BID) | ORAL | Status: DC

## 2014-09-27 NOTE — Progress Notes (Signed)
Patient: [name] / Admit Date: 09/25/2014 / Date of Encounter: 09/27/2014, 8:06 AM  Primary cardiologist: Dr. Jonelle SidleSamuel G. Clatie [name]  Subjective: Feeling great. Cheerful this AM. No CP, SOB. Denies palpitations.   Objective: Telemetry: NSR/WAP with occasional PVCs, 3 brief runs of SVT since yesterday - question PAT vs flutter Physical Exam: Blood pressure 170/77, pulse 80, temperature 97.4 F (36.3 C), temperature source Oral, resp. rate 16, height 5\' 6"  (1.676 m), weight 163 lb 12.8 oz (74.299 kg), SpO2 100 %. General: Well developed, well nourished F in no acute distress. Head: JVP not elevated. Lungs: Clear bilaterally to auscultation without wheezes, rales, or rhonchi. Breathing is unlabored. Heart: RRR occasional ectopy S1 S2 without murmurs, rubs, or gallops.  Abdomen: Soft, non-tender, non-distended with normoactive bowel sounds. No rebound/guarding. Extremities: No clubbing or cyanosis. No edema. Distal pedal pulses are 2+ and equal bilaterally. Neuro: Alert and oriented X 3. Moves all extremities spontaneously. Psych: Responds to questions appropriately with a normal affect.   Intake/Output Summary (Last 24 hours) at 09/27/14 0806 Last data filed at 09/26/14 2100  Gross per 24 hour  Intake    240 ml  Output    600 ml  Net   -360 ml    Inpatient Medications:  . amiodarone  200 mg Oral BID  . apixaban  5 mg Oral BID  . atorvastatin  40 mg Oral q1800  . ferrous sulfate  325 mg Oral Q breakfast  . glimepiride  1 mg Oral QAC breakfast  . insulin aspart  0-15 Units Subcutaneous TID WC  . insulin aspart  0-5 Units Subcutaneous QHS  . levothyroxine  25 mcg Oral QAC breakfast  . loratadine  10 mg Oral Daily  . pantoprazole  40 mg Oral Daily  . sodium chloride  3 mL Intravenous Q12H  . sodium chloride  3 mL Intravenous Q12H   Infusions:  . diltiazem (CARDIZEM) infusion 5 mg/hr (09/25/14 2153)    Labs:  Recent Labs  09/26/14 0417 09/27/14 0519  NA 139 139  K  3.9 4.0  CL 108 107  CO2 24 24  GLUCOSE 124* 117*  BUN 14 9  CREATININE 0.81 0.70  CALCIUM 8.8* 8.7*    Recent Labs  09/25/14 1030  AST 21  ALT 16  ALKPHOS 79  BILITOT 1.1  PROT 7.2  ALBUMIN 3.6    Recent Labs  09/25/14 1030  WBC 5.7  NEUTROABS 3.8  HGB 12.1  HCT 37.2  MCV 87.9  PLT 292    Recent Labs  09/25/14 1030 09/25/14 1847 09/26/14 0417  TROPONINI 0.11* 0.18* 0.17*   Invalid input(s): POCBNP  Recent Labs  09/25/14 1030  HGBA1C 6.1*     Radiology/Studies:  Dg Chest Portable 1 View  09/25/2014   CLINICAL DATA:  81 year old female with shortness of breath since yesterday, not relieved by inhaler. Initial encounter.  EXAM: PORTABLE CHEST - 1 VIEW  COMPARISON:  09/09/2014 and earlier.  FINDINGS: Portable AP supine view at 1041 hours. Stable cardiac size and mediastinal contours. Stable lung volumes. No pneumothorax, pleural effusion or consolidation. Pulmonary vascularity appears decreased. No acute edema. Stable visualized osseous structures.  IMPRESSION: No acute cardiopulmonary abnormality.   Electronically Signed   By: [name]  [name] M.D.   On: 09/25/2014 10:53   Dg Chest Portable 1 View  09/09/2014   CLINICAL DATA:  Tachycardia  EXAM: PORTABLE CHEST - 1 VIEW  COMPARISON:  07/13/2014 CT  FINDINGS: Heart size mildly enlarged. Scattered atherosclerosis of the  aortic arch. Mild interstitial prominence. No overt effusion. No pneumothorax. Multilevel degenerative change.  IMPRESSION: Mild interstitial prominence can be seen in the setting of interstitial edema, atypical/viral infection, or pneumonitis.   Electronically Signed   By: [name]  [name] M.D.   On: 09/09/2014 23:02     Assessment and Plan  1. Recurrent atrial arrhythmia - Charlene/o PAF, now with atrial flutter this admission - converted to NSR on telemetry on amiodarone. Brief episodes of SVT since yesterday but self-limiting and she was asymptomatic. Amiodarone converted to oral yesterday. Anticipate continuing  200mg  BID at discharge and we can further reduce as an outpatient to once a day. Baseline LFTs, TSH OK recently. Will also start with metoprolol 25mg  BID and titrate as outpatient. Continue apixaban but decrease dose to 2.5mg  BID - pharmacy cautions of interaction with amiodarone, which can in fact increase apixaban levels and risk of bleeding.   2. Probable component of tachy-brady syndrome - yesterday she had HRs in the 40s on telemetry, no bradycardia since yesterday AM.  3. Essential HTN with hypotension on admission - labile BPs. We can follow with resumption of probable AVN blocking agent. Lasix, lisinopril remain on hold. Will ask nursing to recheck BP this AM since it was on the way up.  4. Mod MR with EF 50-55%, grade 3 DD - no acute sx of CHF.  SignedRonie [name], Charlene Dunn PA-C Pager: [phone]   Attending note:  Patient seen and examined. Reviewed interval hospital course and discussed the case with Ms. [name] PA-C. I agree with her assessment. [name] states that she feels better today, heart rhythm has been stable. She had a brief burst of PAT, however this was asymptomatic and she is in sinus rhythm this morning. Plan is discharge home today. We will keep her on amiodarone 200 mg twice daily for a two-week course, reinstitute Lopressor at 25 mg twice daily, and otherwise continue Eliquis however at 2.5 mg twice a day in light of potential interaction with amiodarone. [place].  [name], M.D., F.A.C.C.

## 2014-09-27 NOTE — Progress Notes (Signed)
Pt discharged home today per Dr. Memon. Pt's IV site D/C'd and WDL. Pt's VSS. Pt provided with home medication list, discharge instructions and prescriptions. Verbalized understanding. Pt left floor via WC in stable condition accompanied by NT.  

## 2014-09-27 NOTE — Progress Notes (Signed)
F/u scheduled with Dr. Diona BrownerMcDowell 10/03/14 at 2pm [facility] [place] office. Appt placed in D/C section. Latravious Levitt PA-C

## 2014-09-27 NOTE — Discharge Summary (Signed)
Physician Discharge Summary  Charlene HeadsSophia M [name] WJX:[id number] DOB: 06/10/1926 DOA: 09/25/2014  PCP: Milana ObeyKNOWLTON,[name], MD  Admit date: 09/25/2014 Discharge date: 09/27/2014  Time spent: 40 minutes  Recommendations for Outpatient Follow-up:  1. Follow up with cardiology next week as previously scheduled  Discharge Diagnoses:  Principal Problem:   Atrial flutter with rapid ventricular response Active Problems:   Essential hypertension, benign   Mitral regurgitation   Diabetes   Hypothyroidism   Atrial fibrillation with RVR   Dyspnea on exertion   Chronic diastolic heart failure   Sinus bradycardia   Discharge Condition: improved  Diet recommendation: low salt, low carb  Filed Weights   09/26/14 0500 09/26/14 2013 09/27/14 0600  Weight: 75.7 kg (166 lb 14.2 oz) 74.753 kg (164 lb 12.8 oz) 74.299 kg (163 lb 12.8 oz)    History of present illness:  This patient presented to the hospital with progressive shortness of breath. She checked her vitals at home and was found to have an elevated heart rate in the 140s and systolic blood pressure in the 80s to 90s. She presented to the emergency room and was found to be in rapid atrial flutter. She was started on a Cardizem infusion as well as amiodarone infusion and admitted for further treatments.  Hospital Course:  Patient was monitored in the stepdown unit. She was initially started on Cardizem and amiodarone infusions. She subsequently converted to sinus rhythm on Cardizem was discontinued. Amiodarone was transitioned to oral dosing. She was monitored in the hospital overnight on telemetry. She did not have any significant recurrence of atrial flutter. She is restarted on metoprolol as well as continued on anticoagulation. She was followed by cardiology during her hospital stay. She will follow up with Dr. Diona BrownerMcDowell in one week to recheck her medications. She is feeling significantly improved, no longer short of breath. Heart rate and blood  pressure appear to be well controlled. Patient will be discharged home today.  Procedures:    Consultations:  cardiology  Discharge Exam: Filed Vitals:   09/27/14 0828  BP: 158/62  Pulse: 99  Temp:   Resp:     General: NAD Cardiovascular: S1, s2 RRR Respiratory: CTA B  Discharge Instructions   Discharge Instructions    Diet - low sodium heart healthy    Complete by:  As directed      Increase activity slowly    Complete by:  As directed           Current Discharge Medication List    START taking these medications   Details  amiodarone (PACERONE) 200 MG tablet Take 1 tablet (200 mg total) by mouth 2 (two) times daily. Qty: 28 tablet, Refills: 0      CONTINUE these medications which have CHANGED   Details  apixaban (ELIQUIS) 2.5 MG TABS tablet Take 1 tablet (2.5 mg total) by mouth 2 (two) times daily. Qty: 60 tablet, Refills: 1    furosemide (LASIX) 20 MG tablet Take 1 tablet (20 mg total) by mouth daily as needed. Qty: 30 tablet, Refills: 0    metoprolol tartrate (LOPRESSOR) 25 MG tablet Take 1 tablet (25 mg total) by mouth 2 (two) times daily. Qty: 60 tablet, Refills: 1      CONTINUE these medications which have NOT CHANGED   Details  albuterol (PROVENTIL HFA;VENTOLIN HFA) 108 (90 BASE) MCG/ACT inhaler Inhale 1 puff into the lungs every 6 (six) hours as needed for wheezing or shortness of breath.    ALPRAZolam (XANAX) 0.25 MG  tablet Take 0.25 mg by mouth at bedtime as needed for anxiety or sleep.     atorvastatin (LIPITOR) 40 MG tablet Take 40 mg by mouth daily at 6 PM.    cetirizine (ZYRTEC) 10 MG tablet Take 5-10 mg by mouth daily as needed for allergies or rhinitis.     ferrous sulfate 325 (65 FE) MG tablet Take 325 mg by mouth daily with breakfast.    glimepiride (AMARYL) 2 MG tablet Take 1 mg by mouth daily before breakfast.     levothyroxine (SYNTHROID, LEVOTHROID) 25 MCG tablet Take 25 mcg by mouth daily before breakfast.    omeprazole  (PRILOSEC) 20 MG capsule Take 1 capsule by mouth every other day.     polyethylene glycol (MIRALAX / GLYCOLAX) packet Take 17 g by mouth daily as needed for mild constipation or moderate constipation.    potassium chloride SA (K-DUR,KLOR-CON) 20 MEQ tablet Take 20 mEq by mouth daily as needed (when taking furosemide).       STOP taking these medications     lisinopril (PRINIVIL,ZESTRIL) 10 MG tablet        Allergies  Allergen Reactions  . Chocolate     Hives   Follow-up Information    Follow up with Nona Dell, MD.   Specialty:  Cardiology   Why:  [facility] [place] office - 10/03/14 at 2pm   Contact information:   [address] [place] [address] [phone]        The results of significant diagnostics from this hospitalization (including imaging, microbiology, ancillary and laboratory) are listed below for reference.    Significant Diagnostic Studies: Dg Chest Portable 1 View  09/25/2014   CLINICAL DATA:  81 year old female with shortness of breath since yesterday, not relieved by inhaler. Initial encounter.  EXAM: PORTABLE CHEST - 1 VIEW  COMPARISON:  09/09/2014 and earlier.  FINDINGS: Portable AP supine view at 1041 hours. Stable cardiac size and mediastinal contours. Stable lung volumes. No pneumothorax, pleural effusion or consolidation. Pulmonary vascularity appears decreased. No acute edema. Stable visualized osseous structures.  IMPRESSION: No acute cardiopulmonary abnormality.   Electronically Signed   By: Odessa Fleming M.D.   On: 09/25/2014 10:53   Dg Chest Portable 1 View  09/09/2014   CLINICAL DATA:  Tachycardia  EXAM: PORTABLE CHEST - 1 VIEW  COMPARISON:  07/13/2014 CT  FINDINGS: Heart size mildly enlarged. Scattered atherosclerosis of the aortic arch. Mild interstitial prominence. No overt effusion. No pneumothorax. Multilevel degenerative change.  IMPRESSION: Mild interstitial prominence can be seen in the setting of interstitial edema, atypical/viral  infection, or pneumonitis.   Electronically Signed   By: Jearld Lesch M.D.   On: 09/09/2014 23:02    Microbiology: Recent Results (from the past 240 hour(s))  MRSA PCR Screening     Status: None   Collection Time: 09/25/14  5:51 PM  Result Value Ref Range Status   MRSA by PCR NEGATIVE NEGATIVE Final    Comment:        The GeneXpert MRSA Assay (FDA approved for NASAL specimens only), is one component of a comprehensive MRSA colonization surveillance program. It is not intended to diagnose MRSA infection nor to guide or monitor treatment for MRSA infections.      Labs: Basic Metabolic Panel:  Recent Labs Lab 09/25/14 1030 09/26/14 0417 09/27/14 0519  NA 137 139 139  K 3.5 3.9 4.0  CL 106 108 107  CO2 21* 24 24  GLUCOSE 143* 124* 117*  BUN 17 14 9   CREATININE 0.90 0.81 0.70  CALCIUM 9.0 8.8* 8.7*   Liver Function Tests:  Recent Labs Lab 09/25/14 1030  AST 21  ALT 16  ALKPHOS 79  BILITOT 1.1  PROT 7.2  ALBUMIN 3.6   No results for input(s): LIPASE, AMYLASE in the last 168 hours. No results for input(s): AMMONIA in the last 168 hours. CBC:  Recent Labs Lab 09/25/14 1030  WBC 5.7  NEUTROABS 3.8  HGB 12.1  HCT 37.2  MCV 87.9  PLT 292   Cardiac Enzymes:  Recent Labs Lab 09/25/14 1030 09/25/14 1847 09/26/14 0417  TROPONINI 0.11* 0.18* 0.17*   BNP: BNP (last 3 results)  Recent Labs  09/10/14 0011  BNP 265.0*    ProBNP (last 3 results) No results for input(s): PROBNP in the last 8760 hours.  CBG:  Recent Labs Lab 09/26/14 0719 09/26/14 1112 09/26/14 1609 09/26/14 2034 09/27/14 0738  GLUCAP 111* 124* 98 124* 119*       Signed:  MEMON,JEHANZEB  [facility] 09/27/2014, 11:19 AM

## 2014-09-27 NOTE — Care Management Note (Signed)
Case Management Note  Patient Details  Name: [name] MRN: [id number] Date of Birth: May 01, 1927    Expected Discharge Date:  09/27/14               Expected Discharge Plan:  Home/Self Care  In-House Referral:  NA  Discharge planning Services  CM Consult  Post Acute Care Choice:  NA Choice offered to:  NA  DME Arranged:    DME Agency:     HH Arranged:    HH Agency:     Status of Service:  Completed, signed off  Medicare Important Message Given:    Date Medicare IM Given:    Medicare IM give by:    Date Additional Medicare IM Given:    Additional Medicare Important Message give by:     If discussed at Long Length of Stay Meetings, dates discussed:    Additional Comments: Pt discharging home today with self care. PD aid list given to pt's daughter again at their request. Pt has no CM needs.   Malcolm Metrohildress, Giada Schoppe Demske, RN 09/27/2014, 10:39 AM

## 2014-10-03 ENCOUNTER — Ambulatory Visit (INDEPENDENT_AMBULATORY_CARE_PROVIDER_SITE_OTHER): Payer: Medicare Other | Admitting: Cardiology

## 2014-10-03 ENCOUNTER — Encounter: Payer: Self-pay | Admitting: Cardiology

## 2014-10-03 VITALS — BP 188/78 | HR 62 | Ht 66.0 in | Wt 167.4 lb

## 2014-10-03 DIAGNOSIS — I340 Nonrheumatic mitral (valve) insufficiency: Secondary | ICD-10-CM

## 2014-10-03 DIAGNOSIS — I10 Essential (primary) hypertension: Secondary | ICD-10-CM | POA: Diagnosis not present

## 2014-10-03 DIAGNOSIS — I484 Atypical atrial flutter: Secondary | ICD-10-CM | POA: Diagnosis not present

## 2014-10-03 MED ORDER — AMIODARONE HCL 200 MG PO TABS: 200.0000 mg | ORAL_TABLET | Freq: Every day | ORAL | Status: DC

## 2014-10-03 NOTE — Progress Notes (Signed)
Cardiology Office Note  Date: 10/03/2014   ID: [name], DOB Aug 16, 1926, MRN [id number]  PCP: Milana ObeyKNOWLTON,[name], MD  Primary Cardiologist: Nona DellSamuel McDowell, MD   Chief Complaint  Patient presents with  . Hospitalization Follow-up  . Atrial arrhythmias    History of Present Illness: [name] is an 81 y.o. female just seen by me as an inpatient consult [facility] last week. She had presented at that time with recurring atrial arrhythmias, history of PAF with atrial flutter documented most recently. She was placed on amiodarone and converted to sinus rhythm, also continued on metoprolol and Eliquis which was reduced to 2.5 mg twice daily. She did not have evidence of ACS.  She comes in today with a family member for routine follow-up. Still feels well without any recurrent palpitations or dizziness. She reports compliance with her medications. Amiodarone remains at 200 mg twice daily.  She reports no bleeding problems on Eliquis. Appetite has been good. Most recent lab work is outlined below.   Past Medical History  Diagnosis Date  . Hypothyroidism   . Hypertension   . Type 2 diabetes mellitus   . Palpitations     PAT/EAT/NSVT, normal LVEF  . Mitral regurgitation     Mild  . Anxiety   . Hyperlipidemia   . Depression   . Atrial fibrillation     Past Surgical History  Procedure Laterality Date  . None    . 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 hiatal hernia. Antral erosions status post gastric biopsy.  Charlene Hashimoto. [name] dilation N/A 04/17/2014    Procedure: Charlene HashimotoMALONEY DILATION;  Surgeon: Corbin Adeobert M Rourk, MD;  Location: [facility];  Service: Endoscopy;  Laterality: N/A;    Current Outpatient Prescriptions  Medication Sig Dispense Refill  . albuterol (PROVENTIL HFA;VENTOLIN HFA) 108 (90 BASE) MCG/ACT inhaler Inhale 1 puff into the lungs every 6 (six)  hours as needed for wheezing or shortness of breath.    . ALPRAZolam (XANAX) 0.25 MG tablet Take 0.25 mg by mouth at bedtime as needed for anxiety or sleep.     Marland Kitchen. amiodarone (PACERONE) 200 MG tablet Take 1 tablet (200 mg total) by mouth daily. REDUCE TO DAILY ON Saturday 10/05/14 30 tablet 2  . apixaban (ELIQUIS) 2.5 MG TABS tablet Take 1 tablet (2.5 mg total) by mouth 2 (two) times daily. 60 tablet 1  . atorvastatin (LIPITOR) 40 MG tablet Take 40 mg by mouth daily at 6 PM.    . cetirizine (ZYRTEC) 10 MG tablet Take 5-10 mg by mouth daily as needed for allergies or rhinitis.     . ferrous sulfate 325 (65 FE) MG tablet Take 325 mg by mouth daily with breakfast.    . furosemide (LASIX) 20 MG tablet Take 1 tablet (20 mg total) by mouth daily as needed. 30 tablet 0  . glimepiride (AMARYL) 2 MG tablet Take 1 mg by mouth daily before breakfast.     . levothyroxine (SYNTHROID, LEVOTHROID) 25 MCG tablet Take 25 mcg by mouth daily before breakfast.    . lisinopril (PRINIVIL,ZESTRIL) 10 MG tablet Take 10 mg by mouth daily.    . metoprolol tartrate (LOPRESSOR) 25 MG tablet Take 1 tablet (25 mg total) by mouth 2 (two) times daily. 60 tablet 1  . omeprazole (PRILOSEC) 20 MG capsule Take 1 capsule by mouth every other day.     . polyethylene glycol (  MIRALAX / GLYCOLAX) packet Take 17 g by mouth daily as needed for mild constipation or moderate constipation.    . potassium chloride SA (K-DUR,KLOR-CON) 20 MEQ tablet Take 20 mEq by mouth daily as needed (when taking furosemide).      No current facility-administered medications for this visit.    Allergies:  Chocolate   Social History: The patient  reports that she has never smoked. She has never used smokeless tobacco. She reports that she does not drink alcohol or use illicit drugs.   ROS:  Please see the history of present illness. Otherwise, complete review of systems is positive for occasional reflux symptoms.  All other systems are reviewed and negative.    Physical Exam: VS:  BP 188/78 mmHg  Pulse 62  Ht 5\' 6"  (1.676 m)  Wt 167 lb 6.4 oz (75.932 kg)  BMI 27.03 kg/m2  SpO2 100%, BMI Body mass index is 27.03 kg/(m^2).  Wt Readings from Last 3 Encounters:  10/03/14 167 lb 6.4 oz (75.932 kg)  09/27/14 163 lb 12.8 oz (74.299 kg)  09/18/14 167 lb 12.8 oz (76.114 kg)     General: Patient appears comfortable at rest. HEENT: Conjunctiva and lids normal, oropharynx clear. Neck: Supple, no elevated JVP, no thyromegaly. Lungs: Clear to auscultation, nonlabored breathing at rest. Cardiac: Regular rate and rhythm, no S3, paradoxically split S2, soft systolic murmur, no pericardial rub. Abdomen: Soft, nontender, bowel sounds present. Extremities: Chronic appearing edema, distal pulses 2+. Skin: Warm and dry. Musculoskeletal: No kyphosis. Neuropsychiatric: Alert and oriented x3, affect grossly appropriate.   ECG: ECG is not ordered today.   Recent Labwork: 07/15/2014: Magnesium 2.0 09/09/2014: TSH 2.120 09/10/2014: B Natriuretic Peptide 265.0* 09/25/2014: ALT 16; AST 21; Hemoglobin 12.1; Platelets 292 09/27/2014: BUN 9; Creatinine 0.70; Potassium 4.0; Sodium 139     Component Value Date/Time   CHOL 142 07/14/2014 0456   TRIG 32 07/14/2014 0456   HDL 58 07/14/2014 0456   CHOLHDL 2.4 07/14/2014 0456   VLDL 6 07/14/2014 0456   LDLCALC 78 07/14/2014 0456    Other Studies Reviewed Today:  Echocardiogram 07/14/2014: Study Conclusions  - Left ventricle: The cavity size was normal. Systolic function was normal. The estimated ejection fraction was in the range of 50% to 55%. Wall motion was normal; there were no regional wall motion abnormalities. There was a reduced contribution of atrial contraction to ventricular filling, due to increased ventricular diastolic pressure or atrial contractile dysfunction. Doppler parameters are consistent with a reversible restrictive pattern, indicative of decreased left ventricular  diastolic compliance and/or increased left atrial pressure (grade 3 diastolic dysfunction). - Ventricular septum: Septal motion showed paradox. These changes are consistent with intraventricular conduction delay. - Aortic valve: Trileaflet; normal thickness, mildly calcified leaflets. - Mitral valve: There was moderate regurgitation. - Left atrium: The atrium was mildly to moderately dilated. - Tricuspid valve: There was moderate regurgitation. - Pulmonary arteries: PA peak pressure: 34 mm Hg (S).   Assessment and Plan:  1. Paroxysmal atrial fibrillation and flutter, recent hospitalization last week with initiation of amiodarone and stabilization of rhythm. She converted to sinus rhythm and feels better on follow-up today. We will continue amiodarone although reduce to 200 mg daily starting on Saturday, continue Eliquis and beta blocker.  2. Essential hypertension, blood pressure significantly elevated today. She reports compliance with her medications. I asked her to start checking this more regularly, if trend continues she will need further medication adjustments.  3. Mitral regurgitation, moderate by most recent assessment.  Current medicines were reviewed with the patient today.   Disposition: FU with me in 2 months.   Signed, Jonelle Sidle, MD, [facility] 10/03/2014 2:16 PM     [facility] [address] [place], [place], [place] [address] Phone: [phone]; Fax: [phone]

## 2014-10-03 NOTE — Patient Instructions (Signed)
Your physician has recommended you make the following change in your medication:  Please stay on lisinopril 10 mg daily. After tomorrow, starting on Saturday, please reduce your amiodarone to 200 mg daily. Continue all other medications the same. Your physician recommends that you schedule a follow-up appointment in: 2 months.

## 2014-10-11 NOTE — Telephone Encounter (Signed)
She needs to be on a PPI daily due to her history. Take Prilosec 20 mg daily. I am not sure if she has started any other medications that would interfere with that. It doesn't look like she is on Plavix. Needs to be on a PPI.

## 2014-10-15 NOTE — Telephone Encounter (Signed)
Tried to call pt- NA 

## 2014-10-16 NOTE — Telephone Encounter (Signed)
Tried to call pt- NA 

## 2014-10-17 NOTE — Telephone Encounter (Signed)
Letter mailed to the pt. 

## 2014-10-25 ENCOUNTER — Telehealth: Payer: Self-pay

## 2014-10-25 MED ORDER — OMEPRAZOLE 20 MG PO CPDR: 20.0000 mg | DELAYED_RELEASE_CAPSULE | Freq: Every day | ORAL | Status: DC

## 2014-10-25 NOTE — Telephone Encounter (Signed)
Completed.

## 2014-10-25 NOTE — Telephone Encounter (Signed)
Pt is aware.  

## 2014-10-25 NOTE — Telephone Encounter (Signed)
Pt is calling because she got the letter for her to call us. She does not have any prilosec 20 mg. She will need to have some called in for her. Please advise

## 2014-11-01 ENCOUNTER — Other Ambulatory Visit: Payer: Self-pay | Admitting: Cardiology

## 2014-11-05 ENCOUNTER — Ambulatory Visit: Payer: Medicare Other | Admitting: Cardiology

## 2014-11-07 ENCOUNTER — Other Ambulatory Visit: Payer: Self-pay | Admitting: Cardiology

## 2014-11-19 ENCOUNTER — Telehealth: Payer: Self-pay | Admitting: Cardiology

## 2014-11-19 NOTE — Telephone Encounter (Signed)
Pt wanted confirmation on whether to stay on lisinopril. Per LOV pt is to stay on this medication. Pt voiced understanding

## 2014-11-19 NOTE — Telephone Encounter (Signed)
[name] called wanting to know if she is suppose to continue taking her blood pressure medication.

## 2014-12-03 ENCOUNTER — Ambulatory Visit (INDEPENDENT_AMBULATORY_CARE_PROVIDER_SITE_OTHER): Payer: Medicare Other | Admitting: Cardiology

## 2014-12-03 ENCOUNTER — Encounter: Payer: Self-pay | Admitting: Cardiology

## 2014-12-03 VITALS — BP 205/91 | HR 69 | Ht 66.0 in | Wt 163.8 lb

## 2014-12-03 DIAGNOSIS — I10 Essential (primary) hypertension: Secondary | ICD-10-CM

## 2014-12-03 DIAGNOSIS — I4892 Unspecified atrial flutter: Secondary | ICD-10-CM

## 2014-12-03 MED ORDER — LISINOPRIL 10 MG PO TABS: 10.0000 mg | ORAL_TABLET | Freq: Two times a day (BID) | ORAL | Status: DC

## 2014-12-03 NOTE — Patient Instructions (Signed)
Your physician has recommended you make the following change in your medication:  Increase lisinopril 10 mg to twice daily. Continue all other medications the same. Your physician recommends that you have lab work in 2 weeks to check your BMET. You lab order has been given to you today during your office visit. Your physician recommends that you schedule a follow-up appointment in: 3 months. You will receive a reminder letter in the mail in about 1-2 months reminding you to call and schedule your appointment. If you don't receive this letter, please contact our office.

## 2014-12-03 NOTE — Progress Notes (Signed)
Cardiology Office Note  Date: 12/03/2014   ID: [name], DOB [birth year], MRN [id number]  PCP: Milana Obey, MD  Primary Cardiologist: Nona Dell, MD   Chief Complaint  Patient presents with  . Atypical atrial flutter    History of Present Illness: [name] is an 81 y.o. female last seen in May. She is here today with her daughter for a follow-up visit. Reports no chest pain or palpitations. Follow-up ECG is reviewed below.  Blood pressure was significantly elevated today. She states that it sometimes goes up when she  is under stress. I reviewed her home blood pressure checks and she has also had hypertension documented over her baseline intermittently.   Past Medical History  Diagnosis Date  . Hypothyroidism   . Hypertension   . Type 2 diabetes mellitus   . Palpitations     PAT/EAT/NSVT, normal LVEF  . Mitral regurgitation     Mild  . Anxiety   . Hyperlipidemia   . Depression   . Atrial fibrillation     Current Outpatient Prescriptions  Medication Sig Dispense Refill  . albuterol (PROVENTIL HFA;VENTOLIN HFA) 108 (90 BASE) MCG/ACT inhaler Inhale 1 puff into the lungs every 6 (six) hours as needed for wheezing or shortness of breath.    . ALPRAZolam (XANAX) 0.25 MG tablet Take 0.25 mg by mouth at bedtime as needed for anxiety or sleep.     Marland Kitchen amiodarone (PACERONE) 200 MG tablet Take 1 tablet (200 mg total) by mouth daily. REDUCE TO DAILY ON Saturday 10/05/14 30 tablet 2  . atorvastatin (LIPITOR) 40 MG tablet Take 40 mg by mouth daily at 6 PM.    . cetirizine (ZYRTEC) 10 MG tablet Take 5-10 mg by mouth daily as needed for allergies or rhinitis.     Marland Kitchen ELIQUIS 2.5 MG TABS tablet TAKE 1 TABLET BY MOUTH TWICE DAILY 60 tablet 6  . ferrous sulfate 325 (65 FE) MG tablet Take 325 mg by mouth daily with breakfast.    . furosemide (LASIX) 20 MG tablet TAKE 1 TABLET (20 MG TOTAL) BY MOUTH DAILY AS NEEDED. 30 tablet 3  . glimepiride (AMARYL) 2 MG tablet Take  1 mg by mouth daily before breakfast.     . levothyroxine (SYNTHROID, LEVOTHROID) 25 MCG tablet Take 25 mcg by mouth daily before breakfast.    . lisinopril (PRINIVIL,ZESTRIL) 10 MG tablet Take 1 tablet (10 mg total) by mouth 2 (two) times daily. 60 tablet 6  . metoprolol tartrate (LOPRESSOR) 25 MG tablet TAKE 1 TABLET BY MOUTH TWICE DAILY 60 tablet 6  . omeprazole (PRILOSEC) 20 MG capsule Take 1 capsule (20 mg total) by mouth daily. 30 capsule 5  . polyethylene glycol (MIRALAX / GLYCOLAX) packet Take 17 g by mouth daily as needed for mild constipation or moderate constipation.    . potassium chloride SA (K-DUR,KLOR-CON) 20 MEQ tablet Take 20 mEq by mouth daily as needed (when taking furosemide).      No current facility-administered medications for this visit.    Allergies:  Chocolate   Social History: The patient  reports that she has never smoked. She has never used smokeless tobacco. She reports that she does not drink alcohol or use illicit drugs.   ROS:  Please see the history of present illness. Otherwise, complete review of systems is positive for situational stress.  All other systems are reviewed and negative.   Physical Exam: VS:  BP 205/91 mmHg  Pulse 69  Ht 5'  6" (1.676 m)  Wt 163 lb 12.8 oz (74.299 kg)  BMI 26.45 kg/m2  SpO2 100%, BMI Body mass index is 26.45 kg/(m^2).  Wt Readings from Last 3 Encounters:  12/03/14 163 lb 12.8 oz (74.299 kg)  10/03/14 167 lb 6.4 oz (75.932 kg)  09/27/14 163 lb 12.8 oz (74.299 kg)     General: Patient appears comfortable at rest. HEENT: Conjunctiva and lids normal, oropharynx clear. Neck: Supple, no elevated JVP, no thyromegaly. Lungs: Clear to auscultation, nonlabored breathing at rest. Cardiac: Regular rate and rhythm, no S3, paradoxically split S2, soft systolic murmur, no pericardial rub. Abdomen: Soft, nontender, bowel sounds present. Extremities: Chronic appearing edema, distal pulses 2+.   ECG: ECG is ordered today and  shows sinus rhythm with LVH, QRS widening, and repolarization abnormalities.   Recent Labwork: 07/15/2014: Magnesium 2.0 09/09/2014: TSH 2.120 09/10/2014: B Natriuretic Peptide 265.0* 09/25/2014: ALT 16; AST 21; Hemoglobin 12.1; Platelets 292 09/27/2014: BUN 9; Creatinine, Ser 0.70; Potassium 4.0; Sodium 139     Component Value Date/Time   CHOL 142 07/14/2014 0456   TRIG 32 07/14/2014 0456   HDL 58 07/14/2014 0456   CHOLHDL 2.4 07/14/2014 0456   VLDL 6 07/14/2014 0456   LDLCALC 78 07/14/2014 0456    Other Studies Reviewed Today:  Echocardiogram 07/14/2014: Study Conclusions  - Left ventricle: The cavity size was normal. Systolic function was normal. The estimated ejection fraction was in the range of 50% to 55%. Wall motion was normal; there were no regional wall motion abnormalities. There was a reduced contribution of atrial contraction to ventricular filling, due to increased ventricular diastolic pressure or atrial contractile dysfunction. Doppler parameters are consistent with a reversible restrictive pattern, indicative of decreased left ventricular diastolic compliance and/or increased left atrial pressure (grade 3 diastolic dysfunction). - Ventricular septum: Septal motion showed paradox. These changes are consistent with intraventricular conduction delay. - Aortic valve: Trileaflet; normal thickness, mildly calcified leaflets. - Mitral valve: There was moderate regurgitation. - Left atrium: The atrium was mildly to moderately dilated. - Tricuspid valve: There was moderate regurgitation. - Pulmonary arteries: PA peak pressure: 34 mm Hg (S).   Assessment and Plan:  1. Essential hypertension, blood pressure is not optimally controlled. Increase lisinopril to 10 mg twice daily, follow-up BMET in 2 weeks.  2. History of PAF and atrial flutter, maintaining sinus rhythm on amiodarone and Lopressor. She continues on Eliquis for stroke prophylaxis as  well, no bleeding problems.  Current medicines were reviewed with the patient today.   Orders Placed This Encounter  Procedures  . Basic metabolic panel    Disposition: FU with me in 3 months.   Signed, Jonelle SidleSamuel G. McDowell, MD, [facility] Hospital[facility] 12/03/2014 2:55 PM    [facility] [address], [place], [place] [address] Phone: [phone]; Fax: [phone]) [phone]

## 2014-12-17 ENCOUNTER — Other Ambulatory Visit: Payer: Self-pay | Admitting: Cardiology

## 2014-12-17 LAB — BASIC METABOLIC PANEL
BUN: 17 mg/dL (ref 7–25)
CALCIUM: 9.4 mg/dL (ref 8.6–10.4)
CO2: 23 mEq/L (ref 20–31)
Chloride: 106 mEq/L (ref 98–110)
Creat: 0.98 mg/dL — ABNORMAL HIGH (ref 0.60–0.88)
Glucose, Bld: 117 mg/dL — ABNORMAL HIGH (ref 65–99)
Potassium: 4.7 mEq/L (ref 3.5–5.3)
Sodium: 139 mEq/L (ref 135–146)

## 2014-12-18 ENCOUNTER — Telehealth: Payer: Self-pay | Admitting: *Deleted

## 2014-12-18 NOTE — Telephone Encounter (Signed)
Pt aware, routed to pcp 

## 2014-12-18 NOTE — Telephone Encounter (Signed)
-----   Message from Jonelle Sidle, MD sent at 12/18/2014  7:37 AM EDT ----- Potassium and creatinine remain normal. Continue same treatment for now.

## 2014-12-31 ENCOUNTER — Other Ambulatory Visit: Payer: Self-pay | Admitting: Cardiology

## 2015-02-03 ENCOUNTER — Encounter: Payer: Self-pay | Admitting: Nurse Practitioner

## 2015-02-03 ENCOUNTER — Ambulatory Visit (INDEPENDENT_AMBULATORY_CARE_PROVIDER_SITE_OTHER): Payer: Medicare Other | Admitting: Nurse Practitioner

## 2015-02-03 VITALS — BP 178/82 | HR 67 | Temp 98.5°F | Ht 63.0 in | Wt 157.6 lb

## 2015-02-03 DIAGNOSIS — K259 Gastric ulcer, unspecified as acute or chronic, without hemorrhage or perforation: Secondary | ICD-10-CM | POA: Diagnosis not present

## 2015-02-03 DIAGNOSIS — R1314 Dysphagia, pharyngoesophageal phase: Secondary | ICD-10-CM

## 2015-02-03 NOTE — Assessment & Plan Note (Signed)
Symptoms gastric erosions currently well-controlled. Recommend continue PPI and follow-up in one year. She can call us if she needs to see [place] sooner.

## 2015-02-03 NOTE — Assessment & Plan Note (Signed)
Dysphagia symptoms resolved status post Maloney dilation with noted critical Schatzki's ring and possible web. Continue PPI, follow up in one year or sooner if needed.

## 2015-02-03 NOTE — Progress Notes (Signed)
Referring Provider: Gareth Morgan, MD Primary Care Physician:  Milana Obey, MD Primary GI:  Dr. Jena Gauss  Chief Complaint  Patient presents with  . Follow-up    doing well     HPI:   81 year old female presents for follow-up after EGD with dilation. 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 Pathology showed chronic gastritic and negative for H. Pylori. She was recently in the hospital and her discharge paperwork at her stop her omeprazole. Her family called the office to inquire and she was told she needs to be on a PPI indefinitely due to her history and recommended restart Prilosec 20 mg daily. At her last visit she noted a good result after her EGD with dilation with symptoms vastly improved. Following swallowing precautions. Occasional regurgitation/coughing up of mucous-like material which is more likely nasopharyngeal drainage.  Today she states her dysphagia symptoms are doing well. Crushes large pills in applesauce, is trying to take small pills whole in applesauce. She is taking PPI again. Denies GERD symptoms, abdominal pain, N/V. She is constipated, tried Miralax which was ineffective taking every day. She did try an OTC laxative which helps. This controls her her constipation well. Denies hematocheiza and melena. Denies chest pain, dyspnea, dizziness, lightheadedness, syncope, near syncope. Denies any other upper or lower GI symptoms.   Her blood pressure is a bit elevated today although it is better with a manual cuff at 178/82. She discussed that she is under a lot of stress currently and wonders that is affecting her blood pressure as her husband has terminal cancer isn't and is on hospice at this point. She is the primary caregiver along with assistance from her children.  Past Medical History  Diagnosis Date  .  Hypothyroidism   . Hypertension   . Type 2 diabetes mellitus   . Palpitations     PAT/EAT/NSVT, normal LVEF  . Mitral regurgitation     Mild  . Anxiety   . Hyperlipidemia   . Depression   . Atrial fibrillation     Past Surgical History  Procedure Laterality Date  . None    . 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 hiatal hernia. Antral erosions status post gastric biopsy.  [name] dilation N/A 04/17/2014    Procedure: [name] DILATION;  Surgeon: Corbin Ade, MD;  Location: [facility];  Service: Endoscopy;  Laterality: N/A;    Current Outpatient Prescriptions  Medication Sig Dispense Refill  . albuterol (PROVENTIL HFA;VENTOLIN HFA) 108 (90 BASE) MCG/ACT inhaler Inhale 1 puff into the lungs every 6 (six) hours as needed for wheezing or shortness of breath.    . ALPRAZolam (XANAX) 0.25 MG tablet Take 0.25 mg by mouth at bedtime as needed for anxiety or sleep.     Marland Kitchen amiodarone (PACERONE) 200 MG tablet TAKE 1 TABLET BY MOUTH EVERY DAY 30 tablet 6  . atorvastatin (LIPITOR) 40 MG tablet Take 40 mg by mouth daily at 6 PM.    . ELIQUIS 2.5 MG TABS tablet TAKE 1 TABLET BY MOUTH TWICE DAILY 60 tablet 6  . ferrous sulfate 325 (65 FE) MG tablet Take 325 mg by mouth daily with breakfast.    . furosemide (LASIX) 20 MG tablet TAKE 1 TABLET (20 MG TOTAL) BY MOUTH DAILY AS  NEEDED. 30 tablet 3  . glimepiride (AMARYL) 2 MG tablet Take 1 mg by mouth daily before breakfast.     . levothyroxine (SYNTHROID, LEVOTHROID) 25 MCG tablet Take 25 mcg by mouth daily before breakfast.    . lisinopril (PRINIVIL,ZESTRIL) 10 MG tablet Take 1 tablet (10 mg total) by mouth 2 (two) times daily. 60 tablet 6  . metoprolol tartrate (LOPRESSOR) 25 MG tablet TAKE 1 TABLET BY MOUTH TWICE DAILY 60 tablet 6  . omeprazole (PRILOSEC) 20 MG capsule Take 1 capsule (20 mg total) by mouth daily. 30 capsule 5  .  polyethylene glycol (MIRALAX / GLYCOLAX) packet Take 17 g by mouth daily as needed for mild constipation or moderate constipation.    . potassium chloride SA (K-DUR,KLOR-CON) 20 MEQ tablet Take 20 mEq by mouth daily as needed (when taking furosemide).     . cetirizine (ZYRTEC) 10 MG tablet Take 5-10 mg by mouth daily as needed for allergies or rhinitis.      No current facility-administered medications for this visit.    Allergies as of 02/03/2015 - Review Complete 02/03/2015  Allergen Reaction Noted  . Chocolate  03/10/2014    Family History  Problem Relation Age of Onset  . Heart failure Mother     Died in her 34s  . Tuberculosis Father     Died at young age  . Cancer Brother   . Arthritis    . Colon cancer      Social History   Social History  . Marital Status: Married    Spouse Name: N/A  . Number of Children: N/A  . Years of Education: 11   Occupational History  . Retired   .     Social History Main Topics  . Smoking status: Never Smoker   . Smokeless tobacco: Never Used  . Alcohol Use: No  . Drug Use: No  . Sexual Activity: Not Asked   Other Topics Concern  . None   Social History Narrative    Review of Systems: General: Negative for anorexia, weight loss, fever, chills, fatigue, weakness. CV: Negative for chest pain, angina, palpitations. Respiratory: Negative for dyspnea at rest, cough, sputum, wheezing.  GI: See history of present illness. Endo: Negative for unusual weight change.    Physical Exam: BP 178/82 mmHg  Pulse 67  Temp(Src) 98.5 F (36.9 C) (Oral)  Ht 5\' 3"  (1.6 m)  Wt 157 lb 9.6 oz (71.487 kg)  BMI 27.92 kg/m2 General:   Alert and oriented. Pleasant and cooperative. Well-nourished and well-developed.  Head:  Normocephalic and atraumatic. Eyes:  Without icterus, sclera clear and conjunctiva pink.  Cardiovascular:  S1, S2 present with systolic murmur noted. Normal pulses noted. Extremities without clubbing or edema. Respiratory:   Clear to auscultation bilaterally. No wheezes, rales, or rhonchi. No distress.  Gastrointestinal:  +BS, soft, non-tender and non-distended. No HSM noted. No guarding or rebound. No masses appreciated.  Rectal:  Deferred  Neurologic:  Alert and oriented x4;  grossly normal neurologically. Psych:  Alert and cooperative. Normal mood and affect. Heme/Lymph/Immune: No excessive bruising noted.    02/03/2015 10:37 AM

## 2015-02-03 NOTE — Patient Instructions (Signed)
1. Continue taking your acid blocking medication. 2. Continue swallowing precautions as you've previously been told. He may crush her medications or put them all in applesauce to help take them. 3. Return for follow-up in one year. 4. Call us if you need to see [place] sooner.

## 2015-02-04 NOTE — Progress Notes (Signed)
cc'ed to pcp °

## 2015-03-24 ENCOUNTER — Ambulatory Visit (INDEPENDENT_AMBULATORY_CARE_PROVIDER_SITE_OTHER): Payer: Medicare Other | Admitting: Cardiology

## 2015-03-24 ENCOUNTER — Encounter: Payer: Self-pay | Admitting: Cardiology

## 2015-03-24 VITALS — BP 190/68 | HR 68 | Ht 66.0 in | Wt 159.4 lb

## 2015-03-24 DIAGNOSIS — I10 Essential (primary) hypertension: Secondary | ICD-10-CM

## 2015-03-24 DIAGNOSIS — I4892 Unspecified atrial flutter: Secondary | ICD-10-CM

## 2015-03-24 NOTE — Patient Instructions (Signed)
Your physician recommends that you continue on your current medications as directed. Please refer to the Current Medication list given to you today.  Your physician recommends that you schedule a follow-up appointment in: 3 months  

## 2015-03-24 NOTE — Progress Notes (Signed)
Cardiology Office Note  Date: 03/24/2015   ID: [name], DOB March 18, 1927, MRN [id number]  PCP: Charlene Obey, MD  Primary Cardiologist: Charlene Dell, MD   Chief Complaint  Patient presents with  . Atrial arrhythmias    History of Present Illness: [name] is an 81 y.o. female last seen in July. She presents for a routine follow-up visit. Fortunately, she has not been bothered by any significant palpitations on the current medical regimen.  At the last visit lisinopril was increased to 10 mg twice daily. Her blood pressure is elevated again today, but this morning she says that when she checked at home it was "138/58."  She remains under a lot of stress at home. Her husband of 67 years is declining with cancer, currently has Hospice at home. He gets up a lot of the night which affects her sleeping pattern.   Past Medical History  Diagnosis Date  . Hypothyroidism   . Hypertension   . Type 2 diabetes mellitus (HCC)   . Palpitations     PAT/EAT/NSVT, normal LVEF  . Mitral regurgitation     Mild  . Anxiety   . Hyperlipidemia   . Depression   . Atrial fibrillation [facility])     Current Outpatient Prescriptions  Medication Sig Dispense Refill  . albuterol (PROVENTIL HFA;VENTOLIN HFA) 108 (90 BASE) MCG/ACT inhaler Inhale 1 puff into the lungs every 6 (six) hours as needed for wheezing or shortness of breath.    . ALPRAZolam (XANAX) 0.25 MG tablet Take 0.25 mg by mouth at bedtime as needed for anxiety or sleep.     Marland Kitchen amiodarone (PACERONE) 200 MG tablet TAKE 1 TABLET BY MOUTH EVERY DAY 30 tablet 6  . atorvastatin (LIPITOR) 40 MG tablet Take 40 mg by mouth daily at 6 PM.    . cetirizine (ZYRTEC) 10 MG tablet Take 5-10 mg by mouth daily as needed for allergies or rhinitis.     Marland Kitchen ELIQUIS 2.5 MG TABS tablet TAKE 1 TABLET BY MOUTH TWICE DAILY 60 tablet 6  . ferrous sulfate 325 (65 FE) MG tablet Take 325 mg by mouth daily with breakfast.    . furosemide (LASIX) 20  MG tablet TAKE 1 TABLET (20 MG TOTAL) BY MOUTH DAILY AS NEEDED. 30 tablet 3  . glimepiride (AMARYL) 2 MG tablet Take 1 mg by mouth daily before breakfast.     . levothyroxine (SYNTHROID, LEVOTHROID) 25 MCG tablet Take 25 mcg by mouth daily before breakfast.    . lisinopril (PRINIVIL,ZESTRIL) 10 MG tablet Take 1 tablet (10 mg total) by mouth 2 (two) times daily. 60 tablet 6  . metoprolol tartrate (LOPRESSOR) 25 MG tablet TAKE 1 TABLET BY MOUTH TWICE DAILY 60 tablet 6  . omeprazole (PRILOSEC) 20 MG capsule Take 1 capsule (20 mg total) by mouth daily. 30 capsule 5  . polyethylene glycol (MIRALAX / GLYCOLAX) packet Take 17 g by mouth daily as needed for mild constipation or moderate constipation.    . potassium chloride SA (K-DUR,KLOR-CON) 20 MEQ tablet Take 20 mEq by mouth daily as needed (when taking furosemide).      No current facility-administered medications for this visit.    Allergies:  Chocolate   Social History: The patient  reports that she has never smoked. She has never used smokeless tobacco. She reports that she does not drink alcohol or use illicit drugs.   ROS:  Please see the history of present illness. Otherwise, complete review of systems is positive  for situational stress and anxiety.  All other systems are reviewed and negative.   Physical Exam: VS:  BP 190/68 mmHg  Pulse 68  Ht 5\' 6"  (1.676 m)  Wt 159 lb 6.4 oz (72.303 kg)  BMI 25.74 kg/m2  SpO2 99%, BMI Body mass index is 25.74 kg/(m^2).  Wt Readings from Last 3 Encounters:  03/24/15 159 lb 6.4 oz (72.303 kg)  02/03/15 157 lb 9.6 oz (71.487 kg)  12/03/14 163 lb 12.8 oz (74.299 kg)     General: Patient appears comfortable at rest. HEENT: Conjunctiva and lids normal, oropharynx clear. Neck: Supple, no elevated JVP, no thyromegaly. Lungs: Clear to auscultation, nonlabored breathing at rest. Cardiac: Regular rate and rhythm, no S3, paradoxically split S2, soft systolic murmur, no pericardial rub. Abdomen: Soft,  nontender, bowel sounds present. Extremities: Chronic appearing edema, distal pulses 2+.   ECG: ECG is not ordered today.   Recent Labwork: 07/15/2014: Magnesium 2.0 09/09/2014: TSH 2.120 09/10/2014: B Natriuretic Peptide 265.0* 09/25/2014: ALT 16; AST 21; Hemoglobin 12.1; Platelets 292 12/17/2014: BUN 17; Creat 0.98*; Potassium 4.7; Sodium 139     Component Value Date/Time   CHOL 142 07/14/2014 0456   TRIG 32 07/14/2014 0456   HDL 58 07/14/2014 0456   CHOLHDL 2.4 07/14/2014 0456   VLDL 6 07/14/2014 0456   LDLCALC 78 07/14/2014 0456    Other Studies Reviewed Today:  Echocardiogram 07/14/2014: Study Conclusions  - Left ventricle: The cavity size was normal. Systolic function was normal. The estimated ejection fraction was in the range of 50% to 55%. Wall motion was normal; there were no regional wall motion abnormalities. There was a reduced contribution of atrial contraction to ventricular filling, due to increased ventricular diastolic pressure or atrial contractile dysfunction. Doppler parameters are consistent with a reversible restrictive pattern, indicative of decreased left ventricular diastolic compliance and/or increased left atrial pressure (grade 3 diastolic dysfunction). - Ventricular septum: Septal motion showed paradox. These changes are consistent with intraventricular conduction delay. - Aortic valve: Trileaflet; normal thickness, mildly calcified leaflets. - Mitral valve: There was moderate regurgitation. - Left atrium: The atrium was mildly to moderately dilated. - Tricuspid valve: There was moderate regurgitation. - Pulmonary arteries: PA peak pressure: 34 mm Hg (S).   Assessment and Plan:  1. Symptomatically well controlled PAF/flutter. Plan is to continue amiodarone, Lopressor, and liquids. Plan to recheck TSH and LFTs around the time of her next visit.  2. Essential hypertension, also affected by situational stress. We increased  lisinopril at the last visit. Keep follow-up with Dr. Sudie BaileyKnowlton.  Current medicines were reviewed with the patient today.  Disposition: FU with me in 3 months.   Signed, Jonelle SidleSamuel G. McDowell, MD, [facility] 03/24/2015 1:17 PM    [facility] [address], [place], [place] [address] Phone: [phone]) [phone]; Fax: [phone]) [phone]

## 2015-03-31 ENCOUNTER — Other Ambulatory Visit: Payer: Self-pay | Admitting: Gastroenterology

## 2015-05-30 ENCOUNTER — Other Ambulatory Visit: Payer: Self-pay | Admitting: Cardiology

## 2015-06-03 ENCOUNTER — Telehealth: Payer: Self-pay | Admitting: Cardiology

## 2015-06-03 NOTE — Telephone Encounter (Signed)
Spoke with patient and she is under a lot of stress because her husband has been dx with terminal penile cancer. Patient said that her husband is home with her and she is giving him his care. Patient said she feels overwhelmed and thinks this has caused her blood pressure to go up. Patient denies sob, chest pain or dizziness. Condolecense given to patient and daughter whom was also on the phone and patient instructed to try and alleviate as much stress as possible, also to get as much rest as possible so that if this is the cause of her elevated BP, it would go down some. Also patient asked if its okay to take her anxiety medication (alprazolam) during this time. Nurse advised patient that it was okay to take her xanax as needed as directed. Patient advised to continue to monitor her blood pressures at home no more than twice daily, and if they continue to be elevated that she should contact our office for an earlier appointment. Also advised patient that if she develops symptoms like nausea, chest pain, dizziness or sob, that she needed to go to the ED for an evaluation. Patient and daughter verbalized understanding of pain.

## 2015-06-03 NOTE — Telephone Encounter (Signed)
[name] called stating that she is having issues with elevated BP.  States that her husband is home with Hospice.  BP reading at 12:15 was 204/92 pulse 76 . BP reading at 9:15am 183/84 P 69 right arm and 187/83 pulse 68 left arm.  Family wants to know if patient can also take an anxiety pill with elevated BP.

## 2015-06-18 ENCOUNTER — Encounter: Payer: Self-pay | Admitting: Cardiology

## 2015-06-18 ENCOUNTER — Encounter: Payer: Self-pay | Admitting: *Deleted

## 2015-06-18 ENCOUNTER — Ambulatory Visit (INDEPENDENT_AMBULATORY_CARE_PROVIDER_SITE_OTHER): Payer: Medicare Other | Admitting: Cardiology

## 2015-06-18 VITALS — BP 215/84 | HR 75 | Ht 66.0 in | Wt 162.8 lb

## 2015-06-18 DIAGNOSIS — I4892 Unspecified atrial flutter: Secondary | ICD-10-CM | POA: Diagnosis not present

## 2015-06-18 DIAGNOSIS — I10 Essential (primary) hypertension: Secondary | ICD-10-CM

## 2015-06-18 DIAGNOSIS — I340 Nonrheumatic mitral (valve) insufficiency: Secondary | ICD-10-CM

## 2015-06-18 DIAGNOSIS — E785 Hyperlipidemia, unspecified: Secondary | ICD-10-CM

## 2015-06-18 MED ORDER — LISINOPRIL 20 MG PO TABS: 20.0000 mg | ORAL_TABLET | Freq: Two times a day (BID) | ORAL | Status: DC

## 2015-06-18 NOTE — Patient Instructions (Signed)
Your physician has recommended you make the following change in your medication:  Increase lisinopril to 20 mg twice daily. You may take (2) of your 10 mg tablets twice daily until they are finished. Continue all other medications the same. Your physician recommends that you schedule a follow-up appointment in: 3 months.

## 2015-06-18 NOTE — Progress Notes (Signed)
Cardiology Office Note  Date: 06/18/2015   ID: [name], DOB 1926/08/24, MRN [id number]  PCP: Charlene Obey, MD  Primary Cardiologist: Charlene Dell, MD   Chief Complaint  Patient presents with  . Paroxysmal atrial flutter    History of Present Illness: [name] is an 82 y.o. female last seen in October 2016. She is here today with a family member for a follow-up visit. She is grieving the loss of her husband of 7 years who died recently with cancer. She has been under a lot of stress, not sleeping well. Her blood pressure has been elevated fairly consistently. She has experienced fatigue and some shortness of breath as well. She states that she saw Dr. Sudie [name] recently and he modified her pulmonary inhaler regimen.  I reviewed her medications which are outlined below. Antihypertensive regimen includes lisinopril and Lopressor. We discussed further advancing her lisinopril dose. She states that she has been consistent in taking her medications, and did bring them all in today.   In terms of her heart rhythm control, she has done well without palpitations on amiodarone. She indicates having recent lab work with Dr. Sudie [name] which we will request.  She does not report any angina symptoms, orthopnea, or PND.  Past Medical History  Diagnosis Date  . Hypothyroidism   . Hypertension   . Type 2 diabetes mellitus (HCC)   . Palpitations     PAT/EAT/NSVT, normal LVEF  . Mitral regurgitation     Mild  . Anxiety   . Hyperlipidemia   . Depression   . Atrial fibrillation [facility])     Past Surgical History  Procedure Laterality Date  . None    . 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 hiatal hernia. Antral erosions status post gastric biopsy.  [name] dilation N/A 04/17/2014    Procedure: [name] DILATION;  Surgeon: Charlene Ade, MD;  [facility];  Service: Endoscopy;  Laterality: N/A;    Current Outpatient Prescriptions  Medication Sig Dispense Refill  . albuterol (PROVENTIL HFA;VENTOLIN HFA) 108 (90 BASE) MCG/ACT inhaler Inhale 1 puff into the lungs every 6 (six) hours as needed for wheezing or shortness of breath.    . ALPRAZolam (XANAX) 0.25 MG tablet Take 0.25 mg by mouth at bedtime as needed for anxiety or sleep.     Marland Kitchen amiodarone (PACERONE) 200 MG tablet TAKE 1 TABLET BY MOUTH EVERY DAY 30 tablet 6  . atorvastatin (LIPITOR) 40 MG tablet Take 40 mg by mouth daily at 6 PM.    . bisacodyl (DULCOLAX) 5 MG EC tablet Take 5 mg by mouth daily as needed for moderate constipation.    . budesonide-formoterol (SYMBICORT) 80-4.5 MCG/ACT inhaler Inhale 2 puffs into the lungs daily.    . cetirizine (ZYRTEC) 10 MG tablet Take 5-10 mg by mouth daily as needed for allergies or rhinitis.     Marland Kitchen ELIQUIS 2.5 MG TABS tablet TAKE 1 TABLET BY MOUTH TWICE DAILY 60 tablet 6  . ferrous sulfate 325 (65 FE) MG tablet Take 325 mg by mouth daily with breakfast.    . furosemide (LASIX) 20 MG tablet TAKE 1 TABLET (20 MG TOTAL) BY MOUTH DAILY AS NEEDED. 30 tablet 3  . glimepiride (AMARYL) 2 MG tablet Take 0.5 mg by mouth daily before breakfast.     . levothyroxine (SYNTHROID, LEVOTHROID) 25 MCG tablet Take 25 mcg by mouth daily  before breakfast.    . metoprolol tartrate (LOPRESSOR) 25 MG tablet TAKE 1 TABLET BY MOUTH TWICE DAILY 60 tablet 6  . omeprazole (PRILOSEC) 20 MG capsule TAKE 1 CAPSULE BY MOUTH EVERY DAY 30 capsule 5  . potassium chloride SA (K-DUR,KLOR-CON) 20 MEQ tablet Take 20 mEq by mouth daily as needed (when taking furosemide).     Marland Kitchen lisinopril (PRINIVIL,ZESTRIL) 20 MG tablet Take 1 tablet (20 mg total) by mouth 2 (two) times daily. 180 tablet 3   No current facility-administered medications for this visit.   Allergies:  Chocolate   Social History: The patient  reports that she has never smoked. She has never used smokeless tobacco. She  reports that she does not drink alcohol or use illicit drugs.   ROS:  Please see the history of present illness. Otherwise, complete review of systems is positive for arthritic pains.  All other systems are reviewed and negative.   Physical Exam: VS:  BP 215/84 mmHg  Pulse 75  Ht  (1.676 m)  Wt 162 lb 12.8 oz (73.846 kg)  BMI 26.29 kg/m2  SpO2 100%, BMI Body mass index is 26.29 kg/(m^2).  Wt Readings from Last 3 Encounters:  06/18/15 162 lb 12.8 oz (73.846 kg)  03/24/15 159 lb 6.4 oz (72.303 kg)  02/03/15 157 lb 9.6 oz (71.487 kg)    General: Elderly woman, appears comfortable at rest. HEENT: Conjunctiva and lids normal, oropharynx clear. Neck: Supple, no elevated JVP, no thyromegaly. Lungs: Clear to auscultation, nonlabored breathing at rest. Cardiac: Regular rate and rhythm, no S3, paradoxically split S2, soft systolic murmur, no pericardial rub. Abdomen: Soft, nontender, bowel sounds present. Extremities: Chronic appearing edema, distal pulses 2+.  ECG:  I reviewed her tracing from 12/03/2014 which showed sinus rhythm with left bundle branch block.  Recent Labwork: 07/15/2014: Magnesium 2.0 09/09/2014: TSH 2.120 09/10/2014: B Natriuretic Peptide 265.0* 09/25/2014: ALT 16; AST 21; Hemoglobin 12.1; Platelets 292 12/17/2014: BUN 17; Creat 0.98*; Potassium 4.7; Sodium 139     Component Value Date/Time   CHOL 142 07/14/2014 0456   TRIG 32 07/14/2014 0456   HDL 58 07/14/2014 0456   CHOLHDL 2.4 07/14/2014 0456   VLDL 6 07/14/2014 0456   LDLCALC 78 07/14/2014 0456    Other Studies Reviewed Today:  Echocardiogram 07/14/2014: Study Conclusions  - Left ventricle: The cavity size was normal. Systolic function was normal. The estimated ejection fraction was in the range of 50% to 55%. Wall motion was normal; there were no regional wall motion abnormalities. There was a reduced contribution of atrial contraction to ventricular filling, due to increased  ventricular diastolic pressure or atrial contractile dysfunction. Doppler parameters are consistent with a reversible restrictive pattern, indicative of decreased left ventricular diastolic compliance and/or increased left atrial pressure (grade 3 diastolic dysfunction). - Ventricular septum: Septal motion showed paradox. These changes are consistent with intraventricular conduction delay. - Aortic valve: Trileaflet; normal thickness, mildly calcified leaflets. - Mitral valve: There was moderate regurgitation. - Left atrium: The atrium was mildly to moderately dilated. - Tricuspid valve: There was moderate regurgitation. - Pulmonary arteries: PA peak pressure: 34 mm Hg (S).  Assessment and Plan:  1. Essential hypertension, blood pressure control is not good at this time. This is complicated by grieving and anxiety due to the recent loss of her husband. She reports compliance with her medications. We will increase lisinopril to 20 mg twice daily. Keep follow-up with Dr. Sudie [name] as well. Requesting most recent lab work for review.  2.  History of PAF, symptomatically well controlled on amiodarone. She is on Eliquis for stroke prophylaxis, no active bleeding problems.  3. Mitral regurgitation, moderate by echocardiogram in February of last year.  4. Hyperlipidemia, she continues on Lipitor.  Current medicines were reviewed with the patient today.   Disposition: FU with me in 3 months.   Signed, Jonelle Sidle, MD, [facility] 06/18/2015 2:10 PM    [facility] at [facility] [address] [place], [place], [place] [address] Phone: [phone]; Fax: [phone]

## 2015-07-09 ENCOUNTER — Telehealth: Payer: Self-pay | Admitting: *Deleted

## 2015-07-09 NOTE — Telephone Encounter (Signed)
Patient called to give an update on her BP. Patient said that her BP is still elevated. This morning BP was 174/76 and this afternoon, BP was 178/76.  Patient denied headache, dizziness, nausea, chest pain, or sob. Patient confirmed that she is taking lisinopril 20 mg twice daily and metoprolol tartrate 25 mg twice daily. Patient encouraged to reduce any added salts in her diet, and to read labels on all foods. Patient did admit that she was still under a lot of stress dealing with the death of her husband. Nurse encouraged patient to try to do things that can help reduce her stress levels because this could be contributing to her elevated BP. Patient advised that her BP is much better than it was at her last office visit. Patient advised to take her BP no more than twice daily, and keep a recording of her readings. Patient advised to call office next week if her BP numbers are still elevated and we could get her an OV with an extender to re-evaluate her BP. Patient verbalized understanding of plan.

## 2015-07-17 ENCOUNTER — Telehealth: Payer: Self-pay | Admitting: *Deleted

## 2015-07-17 NOTE — Telephone Encounter (Signed)
Spoke with daughter whom called requesting an appointment for patient to be seen for SOB and per daughter, SOB was present at last office visit. Per daughter, the SOB has not gotten worse but is still present. Patient has been able to do her normal ADL's without difficulty. Patient said that when she bends over, she notices that she is sob. Patient's BP was 137/65 & HR 55-57. No c/o chest pain, swelling, weight gain, wheezing or fever. Patient did c/o coughing and congestion. Nurse advised daughter to contact patient's PCP and if PCP felt that she needed to see a cardiologist, then we could offer an appointment with an extender at one of the offices. Daughter verbalized understanding of plan.

## 2015-07-21 ENCOUNTER — Other Ambulatory Visit (HOSPITAL_COMMUNITY): Payer: Self-pay | Admitting: Family Medicine

## 2015-07-21 ENCOUNTER — Ambulatory Visit (HOSPITAL_COMMUNITY)
Admission: RE | Admit: 2015-07-21 | Discharge: 2015-07-21 | Disposition: A | Discharge status: Home / Self Care | Payer: Medicare Other | Source: Ambulatory Visit | Attending: Family Medicine | Admitting: Family Medicine

## 2015-07-21 DIAGNOSIS — J45998 Other asthma: Secondary | ICD-10-CM | POA: Insufficient documentation

## 2015-07-21 DIAGNOSIS — R0602 Shortness of breath: Secondary | ICD-10-CM

## 2015-07-21 DIAGNOSIS — J45909 Unspecified asthma, uncomplicated: Secondary | ICD-10-CM

## 2015-07-29 ENCOUNTER — Other Ambulatory Visit: Payer: Self-pay | Admitting: Cardiology

## 2015-08-19 ENCOUNTER — Encounter: Payer: Self-pay | Admitting: Cardiology

## 2015-08-19 ENCOUNTER — Ambulatory Visit (INDEPENDENT_AMBULATORY_CARE_PROVIDER_SITE_OTHER): Payer: Medicare Other | Admitting: Cardiology

## 2015-08-19 VITALS — BP 180/75 | HR 63 | Ht 66.0 in | Wt 158.4 lb

## 2015-08-19 DIAGNOSIS — I4892 Unspecified atrial flutter: Secondary | ICD-10-CM | POA: Diagnosis not present

## 2015-08-19 DIAGNOSIS — I10 Essential (primary) hypertension: Secondary | ICD-10-CM

## 2015-08-19 NOTE — Patient Instructions (Signed)
Your physician recommends that you continue on your current medications as directed. Please refer to the Current Medication list given to you today. Please continue taking your blood pressures at home at the same time once a day using the same machine and same arm. Please use your as needed clonidine if your blood pressure is elevated as directed. Your physician recommends that you schedule a follow-up appointment in: 3 months. You will receive a reminder letter in the mail in about 1-2 months reminding you to call and schedule your appointment. If you don't receive this letter, please contact our office.

## 2015-08-19 NOTE — Progress Notes (Signed)
Cardiology Office Note  Date: 08/19/2015   ID: [name], DOB [birth year], MRN [id number]  PCP: Milana Obey, MD  Primary Cardiologist: Nona Dell, MD   Chief Complaint  Patient presents with  . Atrial arrhythmia  . Hypertension    History of Present Illness: [name] is an 82 y.o. female last seen in January. She is here today with her daughter. She continues to have trouble with elevated blood pressure intermittently, her daughter mentions that this is also often associated with emotional upset and anxiety. She is still going through the grieving process having lost her husband of 67 years. She has seen Dr. Sudie Bailey and was placed on clonidine 0.1 mg to be used when systolic blood pressure: 170.  Since last visit lisinopril was increased to 20 mg twice daily. Reports compliance with this.  She continues on Eliquis for stroke prophylaxis. No reported bleeding problems. No palpitations. She has tolerated amiodarone well in terms of suppression of SVT.  Past Medical History  Diagnosis Date  . Hypothyroidism   . Hypertension   . Type 2 diabetes mellitus (HCC)   . Palpitations     PAT/EAT/NSVT, normal LVEF  . Mitral regurgitation     Mild  . Anxiety   . Hyperlipidemia   . Depression   . Atrial fibrillation [facility])     Past Surgical History  Procedure Laterality Date  . None    . 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 hiatal hernia. Antral erosions status post gastric biopsy.  [name] dilation N/A 04/17/2014    Procedure: [name] DILATION;  Surgeon: Corbin Ade, MD;  [facility];  Service: Endoscopy;  Laterality: N/A;    Current Outpatient Prescriptions  Medication Sig Dispense Refill  . albuterol (PROVENTIL HFA;VENTOLIN HFA) 108 (90 BASE) MCG/ACT inhaler Inhale 1 puff into the lungs every 6 (six) hours as needed for  wheezing or shortness of breath.    . ALPRAZolam (XANAX) 0.25 MG tablet Take 0.25 mg by mouth at bedtime as needed for anxiety or sleep.     Marland Kitchen amiodarone (PACERONE) 200 MG tablet TAKE 1 TABLET BY MOUTH EVERY DAY 90 tablet 3  . atorvastatin (LIPITOR) 40 MG tablet Take 40 mg by mouth daily at 6 PM.    . budesonide-formoterol (SYMBICORT) 80-4.5 MCG/ACT inhaler Inhale 2 puffs into the lungs daily.    . cetirizine (ZYRTEC) 10 MG tablet Take 5-10 mg by mouth daily as needed for allergies or rhinitis.     . cloNIDine (CATAPRES) 0.1 MG tablet Take 0.1 mg by mouth 3 (three) times daily. For BP greater than 170    . ELIQUIS 2.5 MG TABS tablet TAKE 1 TABLET BY MOUTH TWICE DAILY 60 tablet 6  . ferrous sulfate 325 (65 FE) MG tablet Take 325 mg by mouth daily with breakfast.    . furosemide (LASIX) 20 MG tablet TAKE 1 TABLET (20 MG TOTAL) BY MOUTH DAILY AS NEEDED. 30 tablet 3  . glimepiride (AMARYL) 2 MG tablet Take 0.5 mg by mouth daily before breakfast.     . Guaifenesin-Codeine (DIABETIC TUSSIN C PO) Take by mouth as needed.    Marland Kitchen levothyroxine (SYNTHROID, LEVOTHROID) 25 MCG tablet Take 25 mcg by mouth daily before breakfast.    . lisinopril (PRINIVIL,ZESTRIL) 20 MG tablet Take 1 tablet (20 mg total) by mouth 2 (two) times daily. 180 tablet 3  . metoprolol tartrate (  LOPRESSOR) 25 MG tablet TAKE 1 TABLET BY MOUTH TWICE DAILY 60 tablet 6  . omeprazole (PRILOSEC) 20 MG capsule TAKE 1 CAPSULE BY MOUTH EVERY DAY 30 capsule 5  . potassium chloride SA (K-DUR,KLOR-CON) 20 MEQ tablet Take 20 mEq by mouth daily as needed (when taking furosemide).     . sennosides-docusate sodium (SENOKOT-S) 8.6-50 MG tablet Take 2-4 tablets by mouth daily.     No current facility-administered medications for this visit.   Allergies:  Chocolate   Social History: The patient  reports that she has never smoked. She has never used smokeless tobacco. She reports that she does not drink alcohol or use illicit drugs.   ROS:  Please see  the history of present illness. Otherwise, complete review of systems is positive for anxiety and grief.  All other systems are reviewed and negative.   Physical Exam: VS:  BP 180/75 mmHg  Pulse 63  Ht 5\' 6"  (1.676 m)  Wt 158 lb 6.4 oz (71.85 kg)  BMI 25.58 kg/m2  SpO2 100%, BMI Body mass index is 25.58 kg/(m^2).  Wt Readings from Last 3 Encounters:  08/19/15 158 lb 6.4 oz (71.85 kg)  06/18/15 162 lb 12.8 oz (73.846 kg)  03/24/15 159 lb 6.4 oz (72.303 kg)    General: Elderly woman, appears comfortable at rest. HEENT: Conjunctiva and lids normal, oropharynx clear. Neck: Supple, no elevated JVP, no thyromegaly. Lungs: Clear to auscultation, nonlabored breathing at rest. Cardiac: Regular rate and rhythm, no S3, paradoxically split S2, soft systolic murmur, no pericardial rub. Abdomen: Soft, nontender, bowel sounds present. Extremities: Chronic appearing edema, distal pulses 2+.  ECG: I personally reviewed the prior tracing from 12/03/2014 which showed sinus rhythm with left bundle branch block.  Recent Labwork: 09/09/2014: TSH 2.120 09/10/2014: B Natriuretic Peptide 265.0* 09/25/2014: ALT 16; AST 21; Hemoglobin 12.1; Platelets 292 12/17/2014: BUN 17; Creat 0.98*; Potassium 4.7; Sodium 139     Component Value Date/Time   CHOL 142 07/14/2014 0456   TRIG 32 07/14/2014 0456   HDL 58 07/14/2014 0456   CHOLHDL 2.4 07/14/2014 0456   VLDL 6 07/14/2014 0456   LDLCALC 78 07/14/2014 0456  October 2016: Cholesterol 167, HDL 69, triglycerides 57, LDL 87, potassium 4.3, BUN 17, creatinine 0.9, AST 16, ALT 22, TSH 2.4, hemoglobin 11.7, platelets 239 January 2017: Hemoglobin A1c 5.6  Other Studies Reviewed Today:  Echocardiogram 07/14/2014: Study Conclusions  - Left ventricle: The cavity size was normal. Systolic function was normal. The estimated ejection fraction was in the range of 50% to 55%. Wall motion was normal; there were no regional wall motion abnormalities. There was a  reduced contribution of atrial contraction to ventricular filling, due to increased ventricular diastolic pressure or atrial contractile dysfunction. Doppler parameters are consistent with a reversible restrictive pattern, indicative of decreased left ventricular diastolic compliance and/or increased left atrial pressure (grade 3 diastolic dysfunction). - Ventricular septum: Septal motion showed paradox. These changes are consistent with intraventricular conduction delay. - Aortic valve: Trileaflet; normal thickness, mildly calcified leaflets. - Mitral valve: There was moderate regurgitation. - Left atrium: The atrium was mildly to moderately dilated. - Tricuspid valve: There was moderate regurgitation. - Pulmonary arteries: PA peak pressure: 34 mm Hg (S).  Assessment and Plan:  1. Essential hypertension. Continue current doses of lisinopril and metoprolol. Might be best to use clonidine on a standing basis if tolerated. I have asked her to check her blood pressure as she has been doing, and if her clonidine is being used  more than twice a week, consider using this daily. Keep follow-up with Dr. Sudie Bailey.  2. History of PAF and SVT, well controlled on amiodarone. She continues on Eliquis without any active bleeding problems.  Current medicines were reviewed with the patient today.  Disposition: FU with me in 3 months.   Signed, Jonelle Sidle, MD, [facility] 08/19/2015 12:23 PM    [facility] at [facility] [address] [place], [place], [place] [address] Phone: [phone]; Fax: [phone]

## 2015-08-28 ENCOUNTER — Other Ambulatory Visit: Payer: Self-pay | Admitting: Cardiology

## 2015-09-29 ENCOUNTER — Other Ambulatory Visit: Payer: Self-pay | Admitting: Gastroenterology

## 2015-10-29 ENCOUNTER — Ambulatory Visit (INDEPENDENT_AMBULATORY_CARE_PROVIDER_SITE_OTHER): Payer: Medicare Other | Admitting: Nurse Practitioner

## 2015-10-29 ENCOUNTER — Encounter: Payer: Self-pay | Admitting: Nurse Practitioner

## 2015-10-29 VITALS — BP 203/87 | HR 65 | Temp 97.0°F | Ht 66.0 in | Wt 156.8 lb

## 2015-10-29 DIAGNOSIS — K5900 Constipation, unspecified: Secondary | ICD-10-CM | POA: Diagnosis not present

## 2015-10-29 DIAGNOSIS — R1314 Dysphagia, pharyngoesophageal phase: Secondary | ICD-10-CM

## 2015-10-29 NOTE — Progress Notes (Signed)
Referring Provider: Gareth Morgan, MD Primary Care Physician:  Milana Obey, MD Primary GI:  Dr. Jena Gauss  Chief Complaint  Patient presents with  . Dysphagia    HPI:   [name] is a 82 y.o. female who presents For follow-up on dysphagia. Last seen in our office 02/03/2015 at which point she stated she was doing well with dysphagia symptoms. Crushing large pills of applesauce, taking small pills whole and applesauce, taking PPI. She is status post upper endoscopy on 04/17/2014 with probable cervical esophageal web, critical Schatzki's ring status post dilation with 52 and 54 [place] Maloney dilator. Also noted scattered antral erosions. Also noted chronic gastritis negative for H. pylori. Last office visit recommendations continue PPI, follow up in one year.  Visit accompanied by granddaughter. Today she states her dysphagia symptoms have been returning, worse a couple weeks ago. Today is better. Solid food dysphagia, no pill dysphagia takes them crushed in applesauce. Diet includes meats, vegetables, mashed potatoes. States she cuts her meats up small. Cannot identify foods more difficult to swallow. States she's having shortness of breath, given inhaler by Dr. Sudie Bailey. Husband passed this year, about 5 months ago, increased stress and anxiety. When daughter redirected and asked again, she states she is not having problems swallowing. Denies chest pain, dyspnea, dizziness, lightheadedness, syncope, near syncope. Denies any other upper or lower GI symptoms.  Her blood pressure is elevated today and she has when necessary clonidine 0.1 mg. Daughter states several take it in the car. Recommend follow-up with primary care/cardiology for blood pressure management as recommended.  Past Medical History  Diagnosis Date  . Hypothyroidism   . Hypertension   . Type 2 diabetes mellitus (HCC)   . Palpitations     PAT/EAT/NSVT, normal LVEF  . Mitral regurgitation     Mild  . Anxiety     . Hyperlipidemia   . Depression   . Atrial fibrillation [facility])     Past Surgical History  Procedure Laterality Date  . None    . 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 hiatal hernia. Antral erosions status post gastric biopsy.  [name] dilation N/A 04/17/2014    Procedure: [name] DILATION;  Surgeon: Corbin Ade, MD;  Location: [facility];  Service: Endoscopy;  Laterality: N/A;    Current Outpatient Prescriptions  Medication Sig Dispense Refill  . albuterol (PROVENTIL HFA;VENTOLIN HFA) 108 (90 BASE) MCG/ACT inhaler Inhale 1 puff into the lungs every 6 (six) hours as needed for wheezing or shortness of breath.    . ALPRAZolam (XANAX) 0.25 MG tablet Take 0.25 mg by mouth at bedtime as needed for anxiety or sleep.     Marland Kitchen amiodarone (PACERONE) 200 MG tablet TAKE 1 TABLET BY MOUTH EVERY DAY 90 tablet 3  . atorvastatin (LIPITOR) 40 MG tablet Take 40 mg by mouth daily at 6 PM.    . budesonide-formoterol (SYMBICORT) 80-4.5 MCG/ACT inhaler Inhale 2 puffs into the lungs daily.    . cetirizine (ZYRTEC) 10 MG tablet Take 5-10 mg by mouth daily as needed for allergies or rhinitis.     . cloNIDine (CATAPRES) 0.1 MG tablet Take 0.1 mg by mouth 3 (three) times daily. For BP greater than 170    . ELIQUIS 2.5 MG TABS tablet TAKE 1 TABLET BY MOUTH TWICE DAILY 60 tablet 6  . ferrous sulfate 325 (65 FE) MG tablet Take 325 mg by mouth  daily with breakfast.    . furosemide (LASIX) 20 MG tablet TAKE 1 TABLET (20 MG TOTAL) BY MOUTH DAILY AS NEEDED. 30 tablet 3  . glimepiride (AMARYL) 2 MG tablet Take 0.5 mg by mouth daily before breakfast.     . Guaifenesin-Codeine (DIABETIC TUSSIN C PO) Take by mouth as needed.    Marland Kitchen. levothyroxine (SYNTHROID, LEVOTHROID) 25 MCG tablet Take 25 mcg by mouth daily before breakfast.    . lisinopril (PRINIVIL,ZESTRIL) 10 MG tablet TAKE 1 TABLET BY MOUTH TWICE DAILY  60 tablet 3  . lisinopril (PRINIVIL,ZESTRIL) 20 MG tablet Take 1 tablet (20 mg total) by mouth 2 (two) times daily. 180 tablet 3  . metoprolol tartrate (LOPRESSOR) 25 MG tablet TAKE 1 TABLET BY MOUTH TWICE DAILY 60 tablet 6  . omeprazole (PRILOSEC) 20 MG capsule TAKE 1 CAPSULE BY MOUTH EVERY DAY 30 capsule 5  . potassium chloride SA (K-DUR,KLOR-CON) 20 MEQ tablet Take 20 mEq by mouth daily as needed (when taking furosemide).     . sennosides-docusate sodium (SENOKOT-S) 8.6-50 MG tablet Take 2-4 tablets by mouth daily.     No current facility-administered medications for this visit.    Allergies as of 10/29/2015 - Review Complete 10/29/2015  Allergen Reaction Noted  . Chocolate  03/10/2014    Family History  Problem Relation Age of Onset  . Heart failure Mother     Died in her 6170s  . Tuberculosis Father     Died at young age  . Cancer Brother   . Arthritis    . Colon cancer      Social History   Social History  . Marital Status: Married    Spouse Name: N/A  . Number of Children: N/A  . Years of Education: 11   Occupational History  . Retired   .     Social History Main Topics  . Smoking status: Never Smoker   . Smokeless tobacco: Never Used  . Alcohol Use: No  . Drug Use: No  . Sexual Activity: Not Asked   Other Topics Concern  . None   Social History Narrative    Review of Systems: General: Negative for anorexia, weight loss, fever, chills, fatigue, weakness. ENT: Negative for hoarseness, difficulty swallowing CV: Negative for chest pain, angina, palpitations, peripheral edema.  Respiratory: Negative for cough, sputum, wheezing. Persistent dyspnea. GI: See history of present illness. GU:  Negative for dysuria, hematuria, urinary incontinence, urinary frequency, nocturnal urination.  Endo: Negative for unusual weight change.    Physical Exam: BP 203/87 mmHg  Pulse 65  Temp(Src) 97 F (36.1 C)  Ht 5\' 6"  (1.676 m)  Wt 156 lb 12.8 oz (71.124 kg)  BMI  25.32 kg/m2 General:   Alert and oriented. Pleasant and cooperative. Well-nourished and well-developed.  Ears:  Hard of hearing. Cardiovascular:  S1, S2 present without murmurs appreciated. Extremities without clubbing or edema. Respiratory:  Clear to auscultation bilaterally. No wheezes, rales, or rhonchi. No distress.  Gastrointestinal:  +BS, soft, non-tender and non-distended. No HSM noted. No guarding or rebound. No masses appreciated.  Rectal:  Deferred  Musculoskalatal:  Symmetrical without gross deformities. Neurologic:  Alert and oriented x4;  grossly normal neurologically. Psych:  Alert and cooperative. Normal mood and affect. Heme/Lymph/Immune: No excessive bruising noted.    10/29/2015 11:17 AM   Disclaimer: This note was dictated with voice recognition software. Similar sounding words can inadvertently be transcribed and may not be corrected upon review.

## 2015-10-29 NOTE — Assessment & Plan Note (Addendum)
Previous history of dysphagia, EGD completed with dilation in 2015. Patient at last visit doing well. Comes in today complaining of dysphagia however when questioned further it seems she is having more shortness of breath which she feels in her throat which occurs at times where she is not eating or taking pills. Unlikely worsening dysphagia. Still takes pills in applesauce which pass okay. Recommend continued dysphagia 3 diet, pills in applesauce. If has recurrent dysphagia symptoms can proceed with barium pill esophagram. Return for follow-up as needed.

## 2015-10-29 NOTE — Patient Instructions (Addendum)
1. Continue eating softer foods. I provided more information about this below. 2. Continue taking her pills and applesauce as you have been. 3. For her constipation, try this: Colace stool softener (over-the-counter) once a day. MiraLAX powder, 1 capful mixed into a beverage of your choice once a day or twice a day as needed for constipation. 4. Return for follow-up as needed for any recurrent or worsening symptoms.    Dysphagia Diet Level 3, Mechanically Advanced The dysphagia level 3 diet includes foods that are soft, moist, and can be chopped into 1-inch chunks. This diet is helpful for people with mild swallowing difficulties. It reduces the risk of food getting caught in the windpipe, trachea, or lungs. WHAT DO I NEED TO KNOW ABOUT THIS DIET?  You may eat foods that are soft and moist.  If you were on the dysphagia level 1 or level 2 diets, you may eat any of the foods included on those lists.  Avoid foods that are dry, hard, sticky, chewy, coarse, and crunchy. Also avoid large cuts of food.  Take small bites. Each bite should contain 1 inch or less of food.  Thicken liquids if instructed by your health care provider. Follow your health care provider's instructions on how to do this and to what consistency.  See your dietitian or speech language pathologist regularly for help with your dietary changes. WHAT FOODS CAN I EAT? Grains Moist breads without nuts or seeds. Biscuits, muffins, pancakes, and waffles well-moistened with syrup, jelly, margarine, or butter. Smooth cereals with plenty of milk to moisten them. Moist bread stuffing. Moist rice. Vegetables All cooked, soft vegetables. Shredded lettuce. Tender fried potatoes. Fruits All canned and cooked fruits. Soft, peeled fresh fruits, such as peaches, nectarines, kiwis, cantaloupe, honeydew melon, and watermelon without seeds. Soft berries, such as strawberries. Meat and Other Protein Sources Moist ground or finely diced or  sliced meats. Solid, tender cuts of meat. Meatloaf. Hamburger with a bun. Sausage patty. Deli thin-sliced lunch meat. Chicken, egg, or tuna salad sandwich. Sloppy joe. Moist fish. Eggs prepared any way. Casseroles with small chunks of meats, ground meats, or tender meats. Dairy Cheese spreads without coarse large chunks. Shredded cheese. Cheese slices. Cottage cheese. Milk at the right texture. Smooth frappes. Yogurt without nuts or coconut. Ask your health care provider whether you can have frozen desserts (such as malts or milk shakes) and thin liquids. Sweets/Desserts Soft, smooth, moist desserts. Non-chewy, smooth candy. Jam. Jelly. Honey. Preserves. Ask your health care provider whether you can have frozen desserts. Fats and Oils Butter. Oils. Margarine. Mayonnaise. Gravy. Spreads. Other All seasonings and sweeteners. All sauces without large chunks. The items listed above may not be a complete list of recommended foods or beverages. Contact your dietitian for more options. WHAT FOODS ARE NOT RECOMMENDED? Grains Coarse or dry cereals. Dry breads. Toast. Crackers. Tough, crusty breads, such French bread and baguettes. Tough, crisp fried potatoes. Potato skins. Dry bread stuffing. Granola. Popcorn. Chips. Vegetables All raw vegetables except shredded lettuce. Cooked corn. Rubbery or stiff cooked vegetables. Stringy vegetables, such as celery. Fruits Hard fruits that are difficult to chew, such as apples or pears. Stringy, high-pulp fruits, such as pineapple, papaya, or mango. Fruits with tough skins, such as grapes. Coconut. All dried fruits. Fruit leather. Fruit roll-ups. Fruit snacks. Meat and Other Protein Sources Dry or tough meats or poultry. Dry fish. Fish with bones. Peanut butter. All nuts and seeds. Dairy  Any with nuts, seeds, chocolate chips, dried fruit, coconut, or  pineapple. Sweets/Desserts Dry cakes. Chewy or dry cookies. Any with nuts, seeds, dry fruits, coconut, pineapple,  or anything dry, sticky, or hard. Chewy caramel. Licorice. Taffy-type candies. Ask your health care provider whether you can have frozen desserts. Fats and Oils Any with chunks, nuts, seeds, or pineapple. Olives. Rosita FirePickles. Other Soups with tough or large chunks of meats, poultry, or vegetables. Corn or clam chowder. The items listed above may not be a complete list of foods and beverages to avoid. Contact your dietitian for more information.   This information is not intended to replace advice given to you by your health care provider. Make sure you discuss any questions you have with your health care provider.   Document Released: 05/10/2005 Document Revised: 05/31/2014 Document Reviewed: 04/23/2013 Elsevier Interactive Patient Education [employer]2016 Elsevier Inc.

## 2015-10-29 NOTE — Progress Notes (Signed)
CC'ED TO PCP 

## 2015-10-29 NOTE — Assessment & Plan Note (Addendum)
Persistent constipation. Recommend Colace stool softener once a day, MiraLAX one capful once a day to twice a day as needed to promote adequate bowel movements. Return for follow-up as needed.

## 2015-11-03 ENCOUNTER — Other Ambulatory Visit (HOSPITAL_COMMUNITY): Payer: Self-pay | Admitting: Respiratory Therapy

## 2015-11-03 DIAGNOSIS — J4520 Mild intermittent asthma, uncomplicated: Secondary | ICD-10-CM

## 2015-11-03 DIAGNOSIS — R0600 Dyspnea, unspecified: Secondary | ICD-10-CM

## 2015-11-12 ENCOUNTER — Encounter: Payer: Self-pay | Admitting: Cardiology

## 2015-11-21 ENCOUNTER — Encounter (HOSPITAL_COMMUNITY): Payer: Self-pay

## 2015-11-24 ENCOUNTER — Ambulatory Visit (INDEPENDENT_AMBULATORY_CARE_PROVIDER_SITE_OTHER): Payer: Medicare Other | Admitting: Cardiology

## 2015-11-24 ENCOUNTER — Encounter: Payer: Self-pay | Admitting: Cardiology

## 2015-11-24 VITALS — BP 172/80 | HR 65 | Ht 66.0 in | Wt 161.0 lb

## 2015-11-24 DIAGNOSIS — I4892 Unspecified atrial flutter: Secondary | ICD-10-CM | POA: Diagnosis not present

## 2015-11-24 DIAGNOSIS — I10 Essential (primary) hypertension: Secondary | ICD-10-CM | POA: Diagnosis not present

## 2015-11-24 MED ORDER — SPIRONOLACTONE 25 MG PO TABS: 12.5000 mg | ORAL_TABLET | Freq: Every day | ORAL | Status: DC

## 2015-11-24 NOTE — Progress Notes (Signed)
Cardiology Office Note  Date: 11/24/2015   ID: Charlene BoardsSophia M Tondreau, DOB 12-15-1926, MRN [id number]  PCP: Milana ObeyStephen D [name], MD  Primary Cardiologist: Nona DellSamuel McDowell, MD   Chief Complaint  Patient presents with  . Atrial arrhythmia  . Hypertension    History of Present Illness: [name] is an 82 y.o. female last seen in March. She is here today with her granddaughter. Overall doing reasonably well, still misses her late husband quite a bit and has anxiety associated with this. I went over her home blood pressure checks.  We discussed her medications today. She continues on reasonable doses of lisinopril and Lopressor. Does not use Lasix or KCL with any regularity, has been on this for intermittent leg edema. Will try to place her on daily Aldactone instead to see if this may also help smooth out her blood pressure fluctuations somewhat. She does have clonidine for substantial elevations in blood pressure as per Dr. Sudie BaileyKnowlton.  I reviewed her ECG today which shows sinus rhythm with left bundle-branch block.  Past Medical History  Diagnosis Date  . Hypothyroidism   . Hypertension   . Type 2 diabetes mellitus (HCC)   . Palpitations     PAT/EAT/NSVT, normal LVEF  . Mitral regurgitation     Mild  . Anxiety   . Hyperlipidemia   . Depression   . Atrial fibrillation [facility](HCC)     Current Outpatient Prescriptions  Medication Sig Dispense Refill  . albuterol (PROVENTIL HFA;VENTOLIN HFA) 108 (90 BASE) MCG/ACT inhaler Inhale 1 puff into the lungs every 6 (six) hours as needed for wheezing or shortness of breath.    . ALPRAZolam (XANAX) 0.25 MG tablet Take 0.25 mg by mouth at bedtime as needed for anxiety or sleep.     Marland Kitchen. amiodarone (PACERONE) 200 MG tablet TAKE 1 TABLET BY MOUTH EVERY DAY 90 tablet 3  . atorvastatin (LIPITOR) 40 MG tablet Take 40 mg by mouth daily at 6 PM.    . budesonide-formoterol (SYMBICORT) 80-4.5 MCG/ACT inhaler Inhale 2 puffs into the lungs daily.    .  cetirizine (ZYRTEC) 10 MG tablet Take 5-10 mg by mouth daily as needed for allergies or rhinitis.     . cloNIDine (CATAPRES) 0.1 MG tablet Take 0.1 mg by mouth 3 (three) times daily. For BP greater than 170    . ELIQUIS 2.5 MG TABS tablet TAKE 1 TABLET BY MOUTH TWICE DAILY 60 tablet 6  . ferrous sulfate 325 (65 FE) MG tablet Take 325 mg by mouth daily with breakfast.    . glimepiride (AMARYL) 2 MG tablet Take 0.5 mg by mouth daily before breakfast.     . Guaifenesin-Codeine (DIABETIC TUSSIN C PO) Take by mouth as needed.    Marland Kitchen. levothyroxine (SYNTHROID, LEVOTHROID) 25 MCG tablet Take 25 mcg by mouth daily before breakfast.    . lisinopril (PRINIVIL,ZESTRIL) 20 MG tablet Take 1 tablet (20 mg total) by mouth 2 (two) times daily. 180 tablet 3  . metoprolol tartrate (LOPRESSOR) 25 MG tablet TAKE 1 TABLET BY MOUTH TWICE DAILY 60 tablet 6  . omeprazole (PRILOSEC) 20 MG capsule TAKE 1 CAPSULE BY MOUTH EVERY DAY 30 capsule 5  . sennosides-docusate sodium (SENOKOT-S) 8.6-50 MG tablet Take 2-4 tablets by mouth daily.    Marland Kitchen. spironolactone (ALDACTONE) 25 MG tablet Take 0.5 tablets (12.5 mg total) by mouth daily. 15 tablet 6   No current facility-administered medications for this visit.   Allergies:  Chocolate   Social History: The patient  reports that she has never smoked. She has never used smokeless tobacco. She reports that she does not drink alcohol or use illicit drugs.   ROS:  Please see the history of present illness. Otherwise, complete review of systems is positive for trouble with memory, decreased hearing.  All other systems are reviewed and negative.   Physical Exam: VS:  BP 172/80 mmHg  Pulse 65  Ht 5\' 6"  (1.676 m)  Wt 161 lb (73.029 kg)  BMI 26.00 kg/m2  SpO2 99%, BMI Body mass index is 26 kg/(m^2).  Wt Readings from Last 3 Encounters:  11/24/15 161 lb (73.029 kg)  10/29/15 156 lb 12.8 oz (71.124 kg)  08/19/15 158 lb 6.4 oz (71.85 kg)    General: Elderly woman, appears comfortable  at rest. HEENT: Conjunctiva and lids normal, oropharynx clear. Neck: Supple, no elevated JVP, no thyromegaly. Lungs: Clear to auscultation, nonlabored breathing at rest. Cardiac: Regular rate and rhythm, no S3, paradoxically split S2, soft systolic murmur, no pericardial rub. Abdomen: Soft, nontender, bowel sounds present. Extremities: Chronic appearing edema, distal pulses 2+.  ECG: I personally reviewed the tracing from 12/03/2014 which showed sinus rhythm with left bundle branch block.  Recent Labwork: 12/17/2014: BUN 17; Creat 0.98*; Potassium 4.7; Sodium 139     Component Value Date/Time   CHOL 142 07/14/2014 0456   TRIG 32 07/14/2014 0456   HDL 58 07/14/2014 0456   CHOLHDL 2.4 07/14/2014 0456   VLDL 6 07/14/2014 0456   LDLCALC 78 07/14/2014 0456    Other Studies Reviewed Today:  Echocardiogram 07/14/2014: Study Conclusions  - Left ventricle: The cavity size was normal. Systolic function was normal. The estimated ejection fraction was in the range of 50% to 55%. Wall motion was normal; there were no regional wall motion abnormalities. There was a reduced contribution of atrial contraction to ventricular filling, due to increased ventricular diastolic pressure or atrial contractile dysfunction. Doppler parameters are consistent with a reversible restrictive pattern, indicative of decreased left ventricular diastolic compliance and/or increased left atrial pressure (grade 3 diastolic dysfunction). - Ventricular septum: Septal motion showed paradox. These changes are consistent with intraventricular conduction delay. - Aortic valve: Trileaflet; normal thickness, mildly calcified leaflets. - Mitral valve: There was moderate regurgitation. - Left atrium: The atrium was mildly to moderately dilated. - Tricuspid valve: There was moderate regurgitation. - Pulmonary arteries: PA peak pressure: 34 mm Hg (S).  Assessment and Plan:  1. Essential  hypertension. We are stopping Lasix and KCl which were being used as needed. Start Aldactone 12.5 mg daily and otherwise continue antihypertensive regimen. Follow-up BMET in a few weeks.  2. History of PAF and SVT. Continue amiodarone and Eliquis.  Current medicines were reviewed with the patient today.   Orders Placed This Encounter  Procedures  . EKG 12-Lead    Disposition: Follow-up with me in 3 months.  Signed, Jonelle SidleSamuel G. McDowell, MD, [facility] 11/24/2015 12:11 PM    [facility] [address], [place], [place] [address] Phone: [phone]; Fax: [phone]) [phone]

## 2015-11-24 NOTE — Patient Instructions (Signed)
Medication Instructions:   Stop Lasix.  Stop Potassium.   Begin Aldactone 12.5mg  daily - [facility]  Continue all other medications.    Labwork:  BMET - order given today.  Due in 2 weeks, around December 08, 2015  Office will contact with results via phone or letter.    Testing/Procedures: NONE  Follow-Up: 3 months   Any Other Special Instructions Will Be Listed Below (If Applicable).  If you need a refill on your cardiac medications before your next appointment, please call your pharmacy.

## 2015-11-27 ENCOUNTER — Telehealth: Payer: Self-pay | Admitting: Cardiology

## 2015-11-27 NOTE — Telephone Encounter (Signed)
Returned call to patient.  Stated the roof of her mouth felt like skin was peeling off & felt irritated.  Informed her that she is diabetic & does have Symbicort inhaler (which she uses every day).  She may easily get yeast infection or mouth irritation if she is not inhaling all the Symbicort.  Advised to rinse mouth out really good after using & she could try warm salt water rinses in her mouth.  If not feeling better in a few days, advised to contact primary MD for evaluation.  She can also take her medications with milk.  Patient verbalized understanding.

## 2015-11-27 NOTE — Telephone Encounter (Signed)
Has question about drinking milk with her medication

## 2015-12-09 ENCOUNTER — Other Ambulatory Visit: Payer: Self-pay | Admitting: Cardiology

## 2015-12-09 LAB — BASIC METABOLIC PANEL
BUN: 15 mg/dL (ref 7–25)
CHLORIDE: 105 mmol/L (ref 98–110)
CO2: 22 mmol/L (ref 20–31)
Calcium: 8.9 mg/dL (ref 8.6–10.4)
Creat: 1.08 mg/dL — ABNORMAL HIGH (ref 0.60–0.88)
Glucose, Bld: 89 mg/dL (ref 65–99)
Potassium: 4.3 mmol/L (ref 3.5–5.3)
Sodium: 137 mmol/L (ref 135–146)

## 2015-12-11 ENCOUNTER — Telehealth: Payer: Self-pay | Admitting: *Deleted

## 2015-12-11 NOTE — Telephone Encounter (Signed)
-----   Message from Jonelle SidleSamuel G McDowell, MD sent at 12/10/2015  7:57 AM EDT ----- Results reviewed. Creatinine 1.1 with potassium 4.3. Aldactone started recently. Continue with same plan for now. A copy of this test should be forwarded to Milana ObeyStephen D [name], MD.

## 2015-12-11 NOTE — Telephone Encounter (Signed)
Patient informed and copy sent to PCP. 

## 2015-12-25 ENCOUNTER — Encounter: Payer: Self-pay | Admitting: Internal Medicine

## 2015-12-26 ENCOUNTER — Other Ambulatory Visit: Payer: Self-pay | Admitting: Cardiology

## 2016-01-13 ENCOUNTER — Other Ambulatory Visit: Payer: Self-pay

## 2016-01-13 ENCOUNTER — Encounter: Payer: Self-pay | Admitting: Internal Medicine

## 2016-01-13 ENCOUNTER — Ambulatory Visit (INDEPENDENT_AMBULATORY_CARE_PROVIDER_SITE_OTHER): Payer: Medicare Other | Admitting: Internal Medicine

## 2016-01-13 VITALS — BP 194/94 | HR 71 | Temp 98.1°F | Ht 66.0 in | Wt 159.2 lb

## 2016-01-13 DIAGNOSIS — R1310 Dysphagia, unspecified: Secondary | ICD-10-CM | POA: Diagnosis not present

## 2016-01-13 DIAGNOSIS — K219 Gastro-esophageal reflux disease without esophagitis: Secondary | ICD-10-CM

## 2016-01-13 NOTE — Patient Instructions (Addendum)
  Continue omeprazole daily  Will Schedule a barium pill esophogram - dysphagia  See Drs. Diona BrownerMcDowell and [place] regarding blood pressure  Further recommendations to follow

## 2016-01-13 NOTE — Progress Notes (Signed)
Primary Care Physician:  Milana ObeyStephen D [name], MD Primary Gastroenterologist:  Dr. Jena Gaussourk  Pre-Procedure History & Physical: HPI:  Charlene BoardsSophia M [name] is a 82 y.o. female here for follow-up of  GERD. Some dysphagia symptoms to solids. She has subsisting on a dysphagia 3 diet since being seen here a couple months ago. She has upper and lower dentures. History of cervical web dilated 2015.  In retrospect, patient/ family unsure whether or not his made much difference in her symptoms. If she takes her time while eating, she really does very well reflux symptoms well controlled on omeprazole 20 mg daily. No other intercurrent illnesses but, unfortunately, lost her husband in January after 67 years of marriage. She continues to be anticoagulated on eliquis.  Past Medical History:  Diagnosis Date  . Anxiety   . Atrial fibrillation (HCC)   . Depression   . Hyperlipidemia   . Hypertension   . Hypothyroidism   . Mitral regurgitation    Mild  . Palpitations    PAT/EAT/NSVT, normal LVEF  . Type 2 diabetes mellitus (HCC)     Past Surgical History:  Procedure Laterality Date  . 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 hiatal hernia. Antral erosions status post gastric biopsy.  Marland Kitchen. MALONEY DILATION N/A 04/17/2014   Procedure: Elease HashimotoMALONEY DILATION;  Surgeon: Corbin Adeobert M Rourk, MD;  Location: [facility];  Service: Endoscopy;  Laterality: N/A;  . none      Prior to Admission medications   Medication Sig Start Date End Date Taking? Authorizing Provider  albuterol (PROVENTIL HFA;VENTOLIN HFA) 108 (90 BASE) MCG/ACT inhaler Inhale 1 puff into the lungs every 6 (six) hours as needed for wheezing or shortness of breath.   Yes Historical Provider, MD  ALPRAZolam Prudy Feeler(XANAX) 0.25 MG tablet Take 0.25 mg by mouth at bedtime as needed for anxiety or sleep.    Yes Historical Provider, MD  amiodarone (PACERONE) 200  MG tablet TAKE 1 TABLET BY MOUTH EVERY DAY 07/29/15  Yes Jonelle SidleSamuel G McDowell, MD  atorvastatin (LIPITOR) 40 MG tablet Take 40 mg by mouth daily at 6 PM. 09/27/11  Yes Jonelle SidleSamuel G McDowell, MD  budesonide-formoterol [facility](SYMBICORT) 80-4.5 MCG/ACT inhaler Inhale 2 puffs into the lungs daily.   Yes Historical Provider, MD  cetirizine (ZYRTEC) 10 MG tablet Take 5-10 mg by mouth daily as needed for allergies or rhinitis.    Yes Historical Provider, MD  cloNIDine (CATAPRES) 0.1 MG tablet Take 0.1 mg by mouth 3 (three) times daily. For BP greater than 170   Yes Historical Provider, MD  ELIQUIS 2.5 MG TABS tablet TAKE 1 TABLET BY MOUTH TWICE DAILY 12/26/15  Yes Jonelle SidleSamuel G McDowell, MD  ferrous sulfate 325 (65 FE) MG tablet Take 325 mg by mouth daily with breakfast.   Yes Historical Provider, MD  glimepiride (AMARYL) 2 MG tablet Take 0.5 mg by mouth daily before breakfast.    Yes Historical Provider, MD  Guaifenesin-Codeine (DIABETIC TUSSIN C PO) Take by mouth as needed.   Yes Historical Provider, MD  levothyroxine (SYNTHROID, LEVOTHROID) 25 MCG tablet Take 25 mcg by mouth daily before breakfast.   Yes Historical Provider, MD  lisinopril (PRINIVIL,ZESTRIL) 20 MG tablet Take 1 tablet (20 mg total) by mouth 2 (two) times daily. 06/18/15  Yes Jonelle SidleSamuel G McDowell, MD  metoprolol tartrate (LOPRESSOR) 25 MG tablet TAKE 1 TABLET BY MOUTH TWICE DAILY 12/26/15  Yes Jonelle SidleSamuel G McDowell, MD  omeprazole (  PRILOSEC) 20 MG capsule TAKE 1 CAPSULE BY MOUTH EVERY DAY 09/29/15  Yes Tiffany KocherLeslie S Lewis, PA-C  sennosides-docusate sodium (SENOKOT-S) 8.6-50 MG tablet Take 2-4 tablets by mouth daily.   Yes Historical Provider, MD  spironolactone (ALDACTONE) 25 MG tablet Take 0.5 tablets (12.5 mg total) by mouth daily. 11/24/15  Yes Jonelle SidleSamuel G McDowell, MD    Allergies as of 01/13/2016 - Review Complete 01/13/2016  Allergen Reaction Noted  . Chocolate  03/10/2014    Family History  Problem Relation Age of Onset  . Heart failure Mother     Died in her 4970s  .  Tuberculosis Father     Died at young age  . Cancer Brother   . Arthritis    . Colon cancer      Social History   Social History  . Marital status: Married    Spouse name: N/A  . Number of children: N/A  . Years of education: 6611   Occupational History  . Retired   .  Retired   Social History Main Topics  . Smoking status: Never Smoker  . Smokeless tobacco: Never Used  . Alcohol use No  . Drug use: No  . Sexual activity: Not on file   Other Topics Concern  . Not on file   Social History Narrative  . No narrative on file    Review of Systems: See HPI, otherwise negative ROS  Physical Exam: BP (!) 194/94   Pulse 71   Temp 98.1 F (36.7 C) (Oral)   Ht 5\' 6"  (1.676 m)   Wt 159 lb 3.2 oz (72.2 kg)   BMI 25.70 kg/m  General:   Alert,  well-nourished, pleasant and cooperative in NAD; accompanied by her daughter / caregiver. ute distress. Heart:  Regular rate and rhythm; no murmurs, clicks, rubs,  or gallops. Abdomen: Non-distended, normal bowel sounds.  Soft and nontender without appreciable mass or hepatosplenomegaly.  Pulses:  Normal pulses noted. Extremities:  Without clubbing or edema.  Impression:  Pleasant [age] year old with vague symptoms of esophageal dysphagia. History of cervical web dilated previously. Unsure if the dilation really made a difference in the past. Patient has dentures which is impeding her ability to properly process food boluses. It is possible that a component of dementia may be setting into the picture complicating the history. GERD well-controlled on omeprazole. Blood pressure elevated today      Recommendations:   Continue omeprazole daily  Will Schedule a barium pill esophogram - dysphagia  See Drs. Diona BrownerMcDowell and [place] regarding blood pressure  Further recommendations to follow     Notice: This dictation was prepared with Dragon dictation along with smaller phrase technology. Any transcriptional errors that result from  this process are unintentional and may not be corrected upon review.

## 2016-01-14 ENCOUNTER — Telehealth: Payer: Self-pay

## 2016-01-14 NOTE — Telephone Encounter (Signed)
ok 

## 2016-01-14 NOTE — Telephone Encounter (Signed)
Pt called and requested procedure be cancelled. Was scheduled for barium pill esophogram 01/16/16. Stated she was feeling better. Able to eat breakfast and food was going down ok.

## 2016-01-16 ENCOUNTER — Ambulatory Visit: Payer: Self-pay | Admitting: Gastroenterology

## 2016-01-16 ENCOUNTER — Ambulatory Visit (HOSPITAL_COMMUNITY): Admission: RE | Admit: 2016-01-16 | Payer: Medicare Other | Source: Ambulatory Visit

## 2016-02-24 NOTE — Progress Notes (Signed)
Cardiology Office Note  Date: 02/25/2016   ID: [name], DOB May 15, 1927, MRN [id number]  PCP: Charlene ObeyStephen D [name], MD  Primary Cardiologist: Charlene DellSamuel Loella Hickle, MD   Chief Complaint  Patient presents with  . Atrial Fibrillation  . Leg Swelling    History of Present Illness: [name] is an 82 y.o. female last seen in July. She is here today with her daughter for a follow-up visit. Still seems to be having difficulty with anxiety following the passing of her husband. Blood pressure tends to spike in the evenings when she is most anxious.  At the last visit we started her on Aldactone 12.5 mg daily. Follow-up lab work is outlined below, renal function and potassium stable. She is still having some leg edema.  I reviewed her medications. We discussed using her clonidine 0.1 mg tablets each day in the evenings, might need to go to twice daily standing. Also plan to increase Aldactone to 25 mg daily and follow-up BMET in a few weeks.  Past Medical History:  Diagnosis Date  . Anxiety   . Atrial fibrillation (HCC)   . Depression   . Hyperlipidemia   . Hypertension   . Hypothyroidism   . Mitral regurgitation    Mild  . Palpitations    PAT/EAT/NSVT, normal LVEF  . Type 2 diabetes mellitus (HCC)     Current Outpatient Prescriptions  Medication Sig Dispense Refill  . albuterol (PROVENTIL HFA;VENTOLIN HFA) 108 (90 BASE) MCG/ACT inhaler Inhale 1 puff into the lungs every 6 (six) hours as needed for wheezing or shortness of breath.    . ALPRAZolam (XANAX) 0.25 MG tablet Take 0.25 mg by mouth at bedtime as needed for anxiety or sleep.     Marland Kitchen. amiodarone (PACERONE) 200 MG tablet TAKE 1 TABLET BY MOUTH EVERY DAY 90 tablet 3  . atorvastatin (LIPITOR) 40 MG tablet Take 40 mg by mouth daily at 6 PM.    . budesonide-formoterol (SYMBICORT) 80-4.5 MCG/ACT inhaler Inhale 2 puffs into the lungs daily.    . cetirizine (ZYRTEC) 10 MG tablet Take 5-10 mg by mouth daily as needed for  allergies or rhinitis.     . cloNIDine (CATAPRES) 0.1 MG tablet Take 1 tablet 0.1 mg daily in the evening. 30 tablet 3  . ELIQUIS 2.5 MG TABS tablet TAKE 1 TABLET BY MOUTH TWICE DAILY 60 tablet 6  . ferrous sulfate 325 (65 FE) MG tablet Take 325 mg by mouth daily with breakfast.    . glimepiride (AMARYL) 2 MG tablet Take 0.5 mg by mouth daily before breakfast.     . Guaifenesin-Codeine (DIABETIC TUSSIN C PO) Take by mouth as needed.    Marland Kitchen. levothyroxine (SYNTHROID, LEVOTHROID) 25 MCG tablet Take 25 mcg by mouth daily before breakfast.    . lisinopril (PRINIVIL,ZESTRIL) 20 MG tablet Take 1 tablet (20 mg total) by mouth 2 (two) times daily. 180 tablet 3  . metoprolol tartrate (LOPRESSOR) 25 MG tablet TAKE 1 TABLET BY MOUTH TWICE DAILY 60 tablet 6  . omeprazole (PRILOSEC) 20 MG capsule TAKE 1 CAPSULE BY MOUTH EVERY DAY 30 capsule 5  . sennosides-docusate sodium (SENOKOT-S) 8.6-50 MG tablet Take 2-4 tablets by mouth daily.    Marland Kitchen. spironolactone (ALDACTONE) 25 MG tablet Take 1 tablet (25 mg total) by mouth daily. 30 tablet 6   No current facility-administered medications for this visit.    Allergies:  Chocolate   Social History: The patient  reports that she has never smoked. She has  never used smokeless tobacco. She reports that she does not drink alcohol or use drugs.   ROS:  Please see the history of present illness. Otherwise, complete review of systems is positive for anxiety, intermittent vague sense of dysphasia - she has seen GI.  All other systems are reviewed and negative.   Physical Exam: VS:  BP (!) 148/68   Pulse 67   Ht 5\' 6"  (1.676 m)   Wt 157 lb (71.2 kg)   SpO2 99%   BMI 25.34 kg/m , BMI Body mass index is 25.34 kg/m.  Wt Readings from Last 3 Encounters:  02/25/16 157 lb (71.2 kg)  01/13/16 159 lb 3.2 oz (72.2 kg)  11/24/15 161 lb (73 kg)    General: Elderly woman, appears comfortable at rest. HEENT: Conjunctiva and lids normal, oropharynx clear. Neck: Supple, no  elevated JVP, no thyromegaly. Lungs: Clear to auscultation, nonlabored breathing at rest. Cardiac: Regular rate and rhythm, no S3, paradoxically split S2, soft systolic murmur, no pericardial rub. Abdomen: Soft, nontender, bowel sounds present. Extremities: Chronic appearing edema, distal pulses 2+.  ECG: I personally reviewed the tracing from 11/24/2015 which showed sinus rhythm with left bundle branch block.  Recent Labwork: 12/09/2015: BUN 15; Creat 1.08; Potassium 4.3; Sodium 137     Component Value Date/Time   CHOL 142 07/14/2014 0456   TRIG 32 07/14/2014 0456   HDL 58 07/14/2014 0456   CHOLHDL 2.4 07/14/2014 0456   VLDL 6 07/14/2014 0456   LDLCALC 78 07/14/2014 0456    Other Studies Reviewed Today:  Echocardiogram 07/14/2014: Study Conclusions  - Left ventricle: The cavity size was normal. Systolic function was normal. The estimated ejection fraction was in the range of 50% to 55%. Wall motion was normal; there were no regional wall motion abnormalities. There was a reduced contribution of atrial contraction to ventricular filling, due to increased ventricular diastolic pressure or atrial contractile dysfunction. Doppler parameters are consistent with a reversible restrictive pattern, indicative of decreased left ventricular diastolic compliance and/or increased left atrial pressure (grade 3 diastolic dysfunction). - Ventricular septum: Septal motion showed paradox. These changes are consistent with intraventricular conduction delay. - Aortic valve: Trileaflet; normal thickness, mildly calcified leaflets. - Mitral valve: There was moderate regurgitation. - Left atrium: The atrium was mildly to moderately dilated. - Tricuspid valve: There was moderate regurgitation. - Pulmonary arteries: PA peak pressure: 34 mm Hg (S).  Assessment and Plan:  1. Essential hypertension. Continue current regimen with using clonidine 0.1 mg tablets each evening and  increase Aldactone to 25 mg daily. Follow-up BMET in a few weeks.  2. Leg swelling. Increasing Aldactone dose.  3. Atrial fibrillation, no palpitations on amiodarone, Lopressor, and Eliquis.  Current medicines were reviewed with the patient today.   Orders Placed This Encounter  Procedures  . Basic Metabolic Panel (BMET)    Disposition: Follow-up with me in 3 months.  Signed, Jonelle Sidle, MD, [facility] 02/25/2016 1:51 PM    [facility] [address], [place], [place] [address] Phone: [phone]; Fax: [phone]

## 2016-02-25 ENCOUNTER — Encounter: Payer: Self-pay | Admitting: Cardiology

## 2016-02-25 ENCOUNTER — Ambulatory Visit (INDEPENDENT_AMBULATORY_CARE_PROVIDER_SITE_OTHER): Payer: Medicare Other | Admitting: Cardiology

## 2016-02-25 VITALS — BP 148/68 | HR 67 | Ht 66.0 in | Wt 157.0 lb

## 2016-02-25 DIAGNOSIS — R600 Localized edema: Secondary | ICD-10-CM

## 2016-02-25 DIAGNOSIS — I480 Paroxysmal atrial fibrillation: Secondary | ICD-10-CM

## 2016-02-25 DIAGNOSIS — I10 Essential (primary) hypertension: Secondary | ICD-10-CM

## 2016-02-25 MED ORDER — CLONIDINE HCL 0.1 MG PO TABS: ORAL_TABLET | ORAL | 3 refills | Status: DC

## 2016-02-25 MED ORDER — SPIRONOLACTONE 25 MG PO TABS: 25.0000 mg | ORAL_TABLET | Freq: Every day | ORAL | 6 refills | Status: DC

## 2016-02-25 NOTE — Patient Instructions (Signed)
Medication Instructions:  INCREASE ALDACTONE 25 MG DAILY  TAKE CLONIDINE 0.1 MG DAILY IN THE EVENING   Labwork: Your physician recommends that you return for lab work in: 2 WEEKS    Testing/Procedures: NONE  Follow-Up: Your physician recommends that you schedule a follow-up appointment in: 3 MONTHS    Any Other Special Instructions Will Be Listed Below (If Applicable).     If you need a refill on your cardiac medications before your next appointment, please call your pharmacy.

## 2016-03-14 ENCOUNTER — Emergency Department (HOSPITAL_COMMUNITY)
Admission: EM | Admit: 2016-03-14 | Discharge: 2016-03-14 | Disposition: A | Discharge status: Home / Self Care | Payer: Medicare Other | Attending: Emergency Medicine | Admitting: Emergency Medicine

## 2016-03-14 ENCOUNTER — Encounter (HOSPITAL_COMMUNITY): Payer: Self-pay | Admitting: Emergency Medicine

## 2016-03-14 ENCOUNTER — Emergency Department (HOSPITAL_COMMUNITY): Payer: Medicare Other

## 2016-03-14 DIAGNOSIS — R0600 Dyspnea, unspecified: Secondary | ICD-10-CM

## 2016-03-14 DIAGNOSIS — E039 Hypothyroidism, unspecified: Secondary | ICD-10-CM | POA: Diagnosis not present

## 2016-03-14 DIAGNOSIS — I110 Hypertensive heart disease with heart failure: Secondary | ICD-10-CM | POA: Diagnosis not present

## 2016-03-14 DIAGNOSIS — Z7984 Long term (current) use of oral hypoglycemic drugs: Secondary | ICD-10-CM | POA: Diagnosis not present

## 2016-03-14 DIAGNOSIS — E119 Type 2 diabetes mellitus without complications: Secondary | ICD-10-CM | POA: Insufficient documentation

## 2016-03-14 DIAGNOSIS — I5032 Chronic diastolic (congestive) heart failure: Secondary | ICD-10-CM | POA: Diagnosis not present

## 2016-03-14 DIAGNOSIS — R002 Palpitations: Secondary | ICD-10-CM | POA: Diagnosis not present

## 2016-03-14 DIAGNOSIS — R0602 Shortness of breath: Secondary | ICD-10-CM | POA: Diagnosis not present

## 2016-03-14 DIAGNOSIS — Z79899 Other long term (current) drug therapy: Secondary | ICD-10-CM | POA: Insufficient documentation

## 2016-03-14 LAB — COMPREHENSIVE METABOLIC PANEL
ALT: 27 U/L (ref 14–54)
AST: 25 U/L (ref 15–41)
Albumin: 3.8 g/dL (ref 3.5–5.0)
Alkaline Phosphatase: 68 U/L (ref 38–126)
Anion gap: 6 (ref 5–15)
BUN: 18 mg/dL (ref 6–20)
CHLORIDE: 106 mmol/L (ref 101–111)
CO2: 25 mmol/L (ref 22–32)
CREATININE: 0.99 mg/dL (ref 0.44–1.00)
Calcium: 9.1 mg/dL (ref 8.9–10.3)
GFR calc Af Amer: 58 mL/min — ABNORMAL LOW (ref >60)
GFR calc non Af Amer: 50 mL/min — ABNORMAL LOW (ref >60)
GLUCOSE: 111 mg/dL — AB (ref 65–99)
Potassium: 4.3 mmol/L (ref 3.5–5.1)
SODIUM: 137 mmol/L (ref 135–145)
Total Bilirubin: 0.6 mg/dL (ref 0.3–1.2)
Total Protein: 7.4 g/dL (ref 6.5–8.1)

## 2016-03-14 LAB — CBC
HCT: 37.9 % (ref 36.0–46.0)
HEMOGLOBIN: 12.4 g/dL (ref 12.0–15.0)
MCH: 30 pg (ref 26.0–34.0)
MCHC: 32.7 g/dL (ref 30.0–36.0)
MCV: 91.8 fL (ref 78.0–100.0)
PLATELETS: 264 10*3/uL (ref 150–400)
RBC: 4.13 MIL/uL (ref 3.87–5.11)
RDW: 15.7 % — ABNORMAL HIGH (ref 11.5–15.5)
WBC: 4.6 10*3/uL (ref 4.0–10.5)

## 2016-03-14 LAB — PROTIME-INR
INR: 1.13
Prothrombin Time: 14.6 seconds (ref 11.4–15.2)

## 2016-03-14 LAB — MAGNESIUM: Magnesium: 1.9 mg/dL (ref 1.7–2.4)

## 2016-03-14 LAB — TROPONIN I: Troponin I: 0.03 ng/mL (ref <0.03)

## 2016-03-14 LAB — BRAIN NATRIURETIC PEPTIDE: B NATRIURETIC PEPTIDE 5: 121 pg/mL — AB (ref 0.0–100.0)

## 2016-03-14 MED ORDER — ALBUTEROL SULFATE (2.5 MG/3ML) 0.083% IN NEBU: 5.0000 mg | INHALATION_SOLUTION | Freq: Once | RESPIRATORY_TRACT | Status: DC

## 2016-03-14 MED ORDER — ALBUTEROL SULFATE (2.5 MG/3ML) 0.083% IN NEBU
5.0000 mg | INHALATION_SOLUTION | Freq: Once | RESPIRATORY_TRACT | Status: AC
  Administered 2016-03-14: 5 mg via RESPIRATORY_TRACT
  Filled 2016-03-14: qty 6

## 2016-03-14 MED ORDER — ASPIRIN 81 MG PO CHEW
324.0000 mg | CHEWABLE_TABLET | Freq: Once | ORAL | Status: AC
  Administered 2016-03-14: 324 mg via ORAL
  Filled 2016-03-14: qty 4

## 2016-03-14 NOTE — ED Notes (Signed)
CRITICAL VALUE ALERT  Critical value received:  Neldon Mcina Ubaldo Daywalt RN  Date of notification:  03/14/2016  Time of notification:  2005  Critical value read back:Yes.    Nurse who received alert:  Neldon Mcina Isabella Roemmich RN  MD notified (1st page):  Dr. Jeraldine LootsLockwood  Time of first page:  2018  MD notified (2nd page):  Time of second page:  Responding MD:    Time MD responded:

## 2016-03-14 NOTE — ED Triage Notes (Signed)
Pt reports increasing SOB over the last week, Pt states she has had a productive cough with thick white sputum. No fever. Pt also reports she has felt like her heart rate has been too fast.

## 2016-03-14 NOTE — ED Provider Notes (Addendum)
[facility] Provider Note   CSN: [id number] Arrival date & time: 03/14/16  1826     History   Chief Complaint Chief Complaint  Patient presents with  . Shortness of Breath    HPI [name] is a 82 y.o. female.  HPI Patient presents with multiple complaints. She has multiple medical issues including asthma cardiac condition. Patient herself notes that she has had episodes of symptoms going back for a long time, and this current episode, the past 3 days is similar to multiple prior occurrences. However, over the past 3 days she has had more frequent episodes of palpitations, dyspnea, without chest pain without syncope, without nausea, vomiting. She is unsure of weight gain, weight loss, changes in chronic swelling. No recent changes in medication, and she continues to use her bronchodilator as scheduled. Patient is here with her daughter who assists with the history of present illness, ad lib. the patient has baseline anxiety. She also states that since the patient's husband died 8 months ago patient has had difficulty with her typical activities of daily living, coping.  Past Medical History:  Diagnosis Date  . Anxiety   . Atrial fibrillation (HCC)   . Depression   . Hyperlipidemia   . Hypertension   . Hypothyroidism   . Mitral regurgitation    Mild  . Palpitations    PAT/EAT/NSVT, normal LVEF  . Type 2 diabetes mellitus [facility](HCC)     Patient Active Problem List   Diagnosis Date Noted  . Constipation 10/29/2015  . Gastric erosions 02/03/2015  . Sinus bradycardia 09/26/2014  . Dyspnea on exertion 09/18/2014  . Chronic diastolic heart failure (HCC) 09/18/2014  . Dizziness 09/18/2014  . Atrial flutter with rapid ventricular response (HCC)   . Atrial fibrillation with RVR (HCC) 09/09/2014  . Atrial flutter (HCC) 07/13/2014  . Hyponatremia 07/13/2014  . Diabetes (HCC) 07/13/2014  . Hypothyroidism 07/13/2014  . Tachycardia 07/13/2014  . Dysphagia,  pharyngoesophageal phase 04/16/2014  . Neurogenic pain of lower extremity 03/15/2012  . Left bundle branch block 03/02/2011  . RENAL INSUFFICIENCY 05/22/2010  . Essential hypertension, benign 10/09/2009  . Mitral regurgitation 10/09/2009  . HYPERLIPIDEMIA-MIXED 02/12/2009  . Palpitations 02/12/2009    Past Surgical History:  Procedure Laterality Date  . 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 hiatal hernia. Antral erosions status post gastric biopsy.  Marland Kitchen. MALONEY DILATION N/A 04/17/2014   Procedure: Elease HashimotoMALONEY DILATION;  Surgeon: Corbin Adeobert M Rourk, MD;  Location: [facility];  Service: Endoscopy;  Laterality: N/A;  . none      OB History    Gravida Para Term Preterm AB Living   5 5 4 1   5    SAB TAB Ectopic Multiple Live Births                   Home Medications    Prior to Admission medications   Medication Sig Start Date End Date Taking? Authorizing Provider  albuterol (PROVENTIL HFA;VENTOLIN HFA) 108 (90 BASE) MCG/ACT inhaler Inhale 1 puff into the lungs every 6 (six) hours as needed for wheezing or shortness of breath.    Historical Provider, MD  ALPRAZolam Prudy Feeler(XANAX) 0.25 MG tablet Take 0.25 mg by mouth at bedtime as needed for anxiety or sleep.     Historical Provider, MD  amiodarone (PACERONE) 200 MG tablet TAKE 1 TABLET BY MOUTH EVERY DAY 07/29/15   Jonelle SidleSamuel G McDowell, MD  atorvastatin (  LIPITOR) 40 MG tablet Take 40 mg by mouth daily at 6 PM. 09/27/11   Jonelle Sidle, MD  budesonide-formoterol [facility]) 80-4.5 MCG/ACT inhaler Inhale 2 puffs into the lungs daily.    Historical Provider, MD  cetirizine (ZYRTEC) 10 MG tablet Take 5-10 mg by mouth daily as needed for allergies or rhinitis.     Historical Provider, MD  cloNIDine (CATAPRES) 0.1 MG tablet Take 1 tablet 0.1 mg daily in the evening. 02/25/16   Jonelle Sidle, MD  ELIQUIS 2.5 MG TABS tablet TAKE 1 TABLET BY  MOUTH TWICE DAILY 12/26/15   Jonelle Sidle, MD  ferrous sulfate 325 (65 FE) MG tablet Take 325 mg by mouth daily with breakfast.    Historical Provider, MD  glimepiride (AMARYL) 2 MG tablet Take 0.5 mg by mouth daily before breakfast.     Historical Provider, MD  Guaifenesin-Codeine (DIABETIC TUSSIN C PO) Take by mouth as needed.    Historical Provider, MD  levothyroxine (SYNTHROID, LEVOTHROID) 25 MCG tablet Take 25 mcg by mouth daily before breakfast.    Historical Provider, MD  lisinopril (PRINIVIL,ZESTRIL) 20 MG tablet Take 1 tablet (20 mg total) by mouth 2 (two) times daily. 06/18/15   Jonelle Sidle, MD  metoprolol tartrate (LOPRESSOR) 25 MG tablet TAKE 1 TABLET BY MOUTH TWICE DAILY 12/26/15   Jonelle Sidle, MD  omeprazole (PRILOSEC) 20 MG capsule TAKE 1 CAPSULE BY MOUTH EVERY DAY 09/29/15   Tiffany Kocher, PA-C  sennosides-docusate sodium (SENOKOT-S) 8.6-50 MG tablet Take 2-4 tablets by mouth daily.    Historical Provider, MD  spironolactone (ALDACTONE) 25 MG tablet Take 1 tablet (25 mg total) by mouth daily. 02/25/16   Jonelle Sidle, MD    Family History Family History  Problem Relation Age of Onset  . Heart failure Mother     Died in her 57s  . Tuberculosis Father     Died at young age  . Cancer Brother   . Arthritis    . Colon cancer      Social History Social History  Substance Use Topics  . Smoking status: Never Smoker  . Smokeless tobacco: Never Used  . Alcohol use No     Allergies   Chocolate   Review of Systems Review of Systems  Constitutional:       Per HPI, otherwise negative  HENT:       Per HPI, otherwise negative  Respiratory: Negative for chest tightness.   Cardiovascular: Negative for chest pain.  Gastrointestinal: Negative for abdominal pain, nausea and vomiting.  Endocrine:       Negative aside from HPI  Genitourinary:       Neg aside from HPI   Musculoskeletal:       Per HPI, otherwise negative  Skin: Negative.   Neurological:  Positive for weakness. Negative for syncope.  Psychiatric/Behavioral: The patient is nervous/anxious.      Physical Exam Updated Vital Signs BP (!) 210/70 (BP Location: Left Arm)   Pulse 68   Resp 16   Ht 5\' 6"  (1.676 m)   Wt 157 lb (71.2 kg)   SpO2 100%   BMI 25.34 kg/m   Physical Exam  Constitutional: She is oriented to person, place, and time. She has a sickly appearance. No distress.  HENT:  Head: Normocephalic and atraumatic.  Eyes: Conjunctivae and EOM are normal.  Cardiovascular: Normal rate and regular rhythm.   Pulmonary/Chest: No stridor. She has decreased breath sounds.  Abdominal: She exhibits no  distension.  Musculoskeletal: She exhibits no edema.  Symmetric bilateral lower extremity edema, nonpitting, no other appreciable deformity  Neurological: She is alert and oriented to person, place, and time. No cranial nerve deficit.  Skin: Skin is warm and dry.  Psychiatric: Her mood appears anxious.  Nursing note and vitals reviewed.    ED Treatments / Results  Labs (all labs ordered are listed, but only abnormal results are displayed) Labs Reviewed  CBC  COMPREHENSIVE METABOLIC PANEL  MAGNESIUM  PROTIME-INR  TROPONIN I  BRAIN NATRIURETIC PEPTIDE    EKG  EKG Interpretation  Date/Time:  Sunday March 14 2016 18:44:52 EDT Ventricular Rate:  68 PR Interval:    QRS Duration: 147 QT Interval:  446 QTC Calculation: 475 R Axis:   58 Text Interpretation:  Sinus rhythm IVCD, consider atypical LBBB No significant change since last tracing Abnormal ekg Confirmed by Gerhard Munch  MD [phone]) on 03/14/2016 7:14:56 PM       Radiology No results found.  Procedures Procedures (including critical care time)  Medications Ordered in ED Medications  aspirin chewable tablet 324 mg (not administered)  albuterol (PROVENTIL) (2.5 MG/3ML) 0.083% nebulizer solution 5 mg (not administered)  albuterol (PROVENTIL) (2.5 MG/3ML) 0.083% nebulizer solution 5 mg (5 mg  Nebulization Given 03/14/16 1915)     Initial Impression / Assessment and Plan / ED Course  I have reviewed the triage vital signs and the nursing notes.  Pertinent labs & imaging results that were available during my care of the patient were reviewed by me and considered in my medical decision making (see chart for details).  Clinical Course    8:57 PM Patient substantially improved, states that she feels much better, has no ongoing complaints. We discussed all findings, as well as chronic slight abnormalities. Patient will follow-up with primary care.   Elderly female with multiple medical issues including hypertension, CHF presents with palpitations, mild dyspnea. Here she is awake, alert, no evidence for decompensated heart failure, and although she has trivially elevated troponin, she has no chest pain, EKG is unchanged, and the patient has baseline abnormal troponin. With significant clinical improvement, no evidence for decompensated state, patient discharged in stable condition to follow-up with primary care.    Gerhard Munch, MD 03/14/16 [id number]    Gerhard Munch, MD 03/14/16 [phone]

## 2016-03-14 NOTE — Discharge Instructions (Signed)
As discussed, your evaluation today has been largely reassuring.  But, it is important that you monitor your condition carefully, and do not hesitate to return to the ED if you develop new, or concerning changes in your condition. ? ?Otherwise, please follow-up with your physician for appropriate ongoing care. ? ?

## 2016-03-17 ENCOUNTER — Encounter: Payer: Self-pay | Admitting: Nurse Practitioner

## 2016-03-17 ENCOUNTER — Ambulatory Visit (INDEPENDENT_AMBULATORY_CARE_PROVIDER_SITE_OTHER): Payer: Medicare Other | Admitting: Nurse Practitioner

## 2016-03-17 VITALS — BP 189/64 | HR 59 | Temp 97.5°F | Ht 66.0 in | Wt 155.0 lb

## 2016-03-17 DIAGNOSIS — K219 Gastro-esophageal reflux disease without esophagitis: Secondary | ICD-10-CM | POA: Insufficient documentation

## 2016-03-17 DIAGNOSIS — K5900 Constipation, unspecified: Secondary | ICD-10-CM | POA: Diagnosis not present

## 2016-03-17 DIAGNOSIS — R1314 Dysphagia, pharyngoesophageal phase: Secondary | ICD-10-CM | POA: Diagnosis not present

## 2016-03-17 NOTE — Progress Notes (Signed)
Referring Provider: Gareth Morgan, MD Primary Care Physician:  Milana Obey, MD Primary GI:  Dr. Jena Gauss  Chief Complaint  Patient presents with  . Dysphagia    went to ER Sunday, feels like something stuck in throat (said she is nervous?  . Constipation    HPI:   [name] is a 82 y.o. female who presents For follow-up on dysphagia. The patient was last seen in our office 01/13/2016 for dysphagia and GERD. At that point it was noted she had a history of cervical web dilation previously but they were not sure if this helped. Noted dentures likely impeding ability to properly processed food boluses as well as possible component of dementia playing a part. GERD well-controlled. Recommended continue omeprazole, schedule barium pill esophagram for dysphagia.   BPE appears to been ordered but not completed area  The patient was recently seen in the emergency department 03/14/2016 for multiple medical complaints. The patient states dysphagia was one of the things she went to the ER for, however the provider note does not mention dysphagia. She did have complaints of palpitations. Labs including CBC, CMP, magnesium, PT/INR, troponin, BNP were all normal. EKG found sinus rhythm IV CDE, consider atypical left bundle branch block without significant change in/tracing. Noted to have significant improvement in the emergency department and was discharged home without complaint.  Today she states she's doing "so-so" overall. Was in the ER this weekend for shortness of breath and feels better now. Had an attack last night, used her inhaler which helped. GERD symptoms well controlled. Denies overt dysphagia symptoms, but "feels like there's a knott in there." Cannot describe if she eats fast or slow. Has been following soft foods diet with some improvement in the knott feeling in her throat. Denies hematochezia. Has dark stools on iron, but are formed. States she didn't have swallowing study  done. Has continued constipation, daughter is concerned that she's taking laxatives regularly and not giving other non-laxative options a chance. Denies any other upper or lower GI symptoms.  Past Medical History:  Diagnosis Date  . Anxiety   . Atrial fibrillation (HCC)   . Depression   . Hyperlipidemia   . Hypertension   . Hypothyroidism   . Mitral regurgitation    Mild  . Palpitations    PAT/EAT/NSVT, normal LVEF  . Type 2 diabetes mellitus (HCC)     Past Surgical History:  Procedure Laterality Date  . 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 hiatal hernia. Antral erosions status post gastric biopsy.  Marland Kitchen MALONEY DILATION N/A 04/17/2014   Procedure: [name] DILATION;  Surgeon: Corbin Ade, MD;  Location: [facility];  Service: Endoscopy;  Laterality: N/A;  . none      Current Outpatient Prescriptions  Medication Sig Dispense Refill  . Albuterol Sulfate (PROAIR RESPICLICK) 108 (90 Base) MCG/ACT AEPB Inhale 1-2 puffs into the lungs daily as needed (for shortness of breath).    . ALPRAZolam (XANAX) 0.25 MG tablet Take 0.25 mg by mouth daily as needed for anxiety or sleep.     Marland Kitchen amiodarone (PACERONE) 200 MG tablet TAKE 1 TABLET BY MOUTH EVERY DAY 90 tablet 3  . atorvastatin (LIPITOR) 40 MG tablet Take 40 mg by mouth daily at 6 PM.    . bisacodyl (DULCOLAX) 5 MG EC tablet Take 5 mg by mouth daily as needed for moderate constipation.    . budesonide-formoterol (  SYMBICORT) 80-4.5 MCG/ACT inhaler Inhale 2 puffs into the lungs daily.    . cetirizine (ZYRTEC) 10 MG tablet Take 5-10 mg by mouth daily.     . cloNIDine (CATAPRES) 0.1 MG tablet Take 1 tablet 0.1 mg daily in the evening. 30 tablet 3  . ELIQUIS 2.5 MG TABS tablet TAKE 1 TABLET BY MOUTH TWICE DAILY 60 tablet 6  . ferrous sulfate 325 (65 FE) MG tablet Take 325 mg by mouth daily with breakfast.    . glimepiride  (AMARYL) 2 MG tablet Take 0.5 mg by mouth daily before breakfast.     . guaiFENesin-dextromethorphan (ROBITUSSIN DM) 100-10 MG/5ML syrup Take 5 mLs by mouth every 4 (four) hours as needed for cough (SUGAR FREE).    Marland Kitchen. levothyroxine (SYNTHROID, LEVOTHROID) 25 MCG tablet Take 25 mcg by mouth daily before breakfast.    . lisinopril (PRINIVIL,ZESTRIL) 20 MG tablet Take 1 tablet (20 mg total) by mouth 2 (two) times daily. 180 tablet 3  . metoprolol tartrate (LOPRESSOR) 25 MG tablet TAKE 1 TABLET BY MOUTH TWICE DAILY 60 tablet 6  . omeprazole (PRILOSEC) 20 MG capsule TAKE 1 CAPSULE BY MOUTH EVERY DAY 30 capsule 5  . spironolactone (ALDACTONE) 25 MG tablet Take 1 tablet (25 mg total) by mouth daily. 30 tablet 6   No current facility-administered medications for this visit.     Allergies as of 03/17/2016 - Review Complete 03/17/2016  Allergen Reaction Noted  . Chocolate  03/10/2014    Family History  Problem Relation Age of Onset  . Heart failure Mother     Died in her 4870s  . Tuberculosis Father     Died at young age  . Cancer Brother   . Arthritis    . Colon cancer      Social History   Social History  . Marital status: Married    Spouse name: N/A  . Number of children: N/A  . Years of education: 1011   Occupational History  . Retired   .  Retired   Social History Main Topics  . Smoking status: Never Smoker  . Smokeless tobacco: Never Used  . Alcohol use No  . Drug use: No  . Sexual activity: No   Other Topics Concern  . None   Social History Narrative  . None    Review of Systems: General: Negative for anorexia, fever, chills, fatigue, weakness. ENT: Negative for hoarseness. CV: Negative for chest pain, angina, palpitations, peripheral edema.  Respiratory: Negative for dyspnea at rest, cough, sputum, wheezing.  GI: See history of present illness.   Physical Exam: BP (!) 189/64   Pulse (!) 59   Temp 97.5 F (36.4 C) (Oral)   Ht 5\' 6"  (1.676 m)   Wt 155 lb  (70.3 kg)   BMI 25.02 kg/m  General:   Alert and oriented. Pleasant and cooperative. Well-nourished and well-developed.  Head:  Normocephalic and atraumatic. Eyes:  Without icterus, sclera clear and conjunctiva pink.  Ears:  Normal auditory acuity. Cardiovascular:  S1, S2 present without murmurs appreciated. Extremities without clubbing or edema. Respiratory:  Clear to auscultation bilaterally. No wheezes, rales, or rhonchi. No distress.  Gastrointestinal:  +BS, soft, non-tender and non-distended. No HSM noted. No guarding or rebound. No masses appreciated.  Rectal:  Deferred  Musculoskalatal:  Symmetrical without gross deformities. Neurologic:  Alert and oriented x4;  grossly normal neurologically. Psych:  Alert and cooperative. Normal mood and affect. Heme/Lymph/Immune: No excessive bruising noted.    03/17/2016 4:11 PM  Disclaimer: This note was dictated with voice recognition software. Similar sounding words can inadvertently be transcribed and may not be corrected upon review.

## 2016-03-17 NOTE — Patient Instructions (Signed)
1. Have your swallowing study completed. 2. I'm providing you with instructions below to help with constipation. 3. Return for follow-up in 3 months.   Bowel movement instructions: 1. Take Colace (a stool softener) once a day. 2. You can increase this to twice a day if needed. 3. Add a fiber supplement such as Benefiber once a day. There are multiple options available over-the-counter. If he needed help selecting an option you can discuss with the pharmacist. 4. Once this regimen has you with better bowel movements, you can use MiraLAX one to 2 times a day if you do not have a bowel movement in 2 or more days. 5. For the next 2 weeks she can used over-the-counter laxative as you have been. However, you should eventually be able to decrease the amount of laxatives your taken and eventually stop.

## 2016-03-17 NOTE — Assessment & Plan Note (Signed)
Patient describes a "lump in her throat." Difficult to discern whether she is having dysphagia symptoms or not. She previously was complaining of this and she has a history of esophageal web status post dilation. At this point I will encourage her to complete the swallowing study (BPE) as previously ordered to evaluate for esophageal motility disorder versus recurrent stricture/web/ring. Return for follow-up in 3 months.

## 2016-03-17 NOTE — Assessment & Plan Note (Signed)
Symptoms currently well-controlled on current medications. Continue medications, return for follow-up as needed.

## 2016-03-17 NOTE — Assessment & Plan Note (Signed)
The patient has ongoing constipation. Her daughter states that when they try to give her other options she does not give it more than a day to work. Discussed the need to allow treatment regimens to become effective. Her daughter is concerned about daily use of laxative to have a bowel movement and continued constipation. I will provide a bowel regimen to her including daily stool softener, daily Benefiber, and MiraLAX if no bowel movement in 2 or more days. Return for follow-up in 3 months.

## 2016-03-18 LAB — BASIC METABOLIC PANEL
BUN: 16 mg/dL (ref 7–25)
CALCIUM: 9.3 mg/dL (ref 8.6–10.4)
CO2: 26 mmol/L (ref 20–31)
CREATININE: 0.97 mg/dL — AB (ref 0.60–0.88)
Chloride: 104 mmol/L (ref 98–110)
GLUCOSE: 52 mg/dL — AB (ref 65–99)
Potassium: 4.3 mmol/L (ref 3.5–5.3)
Sodium: 138 mmol/L (ref 135–146)

## 2016-03-18 NOTE — Progress Notes (Signed)
cc'ed to pcp °

## 2016-03-24 ENCOUNTER — Ambulatory Visit (HOSPITAL_COMMUNITY)
Admission: RE | Admit: 2016-03-24 | Discharge: 2016-03-24 | Disposition: A | Discharge status: Home / Self Care | Payer: Medicare Other | Source: Ambulatory Visit | Attending: Internal Medicine | Admitting: Internal Medicine

## 2016-03-24 DIAGNOSIS — R1310 Dysphagia, unspecified: Secondary | ICD-10-CM | POA: Diagnosis present

## 2016-03-24 DIAGNOSIS — K224 Dyskinesia of esophagus: Secondary | ICD-10-CM | POA: Diagnosis not present

## 2016-04-01 ENCOUNTER — Telehealth: Payer: Self-pay

## 2016-04-01 NOTE — Telephone Encounter (Signed)
Pt called stating she was having a hard time catching her breath. She has been having some symptoms of a cold recently, but does not think this is related to that. She is not having any swelling, nor does it feel like her heart is racing. I advised her if she got any worse, she may want to go the ED to be evaluated. Please advise.

## 2016-04-01 NOTE — Telephone Encounter (Signed)
Agree, certainly if her symptoms suddenly worsen she should be seen in the ER. Alternatively if symptoms do not worsen, with recent cold-like symptoms it may be best to see her PCP first.

## 2016-04-11 ENCOUNTER — Encounter (HOSPITAL_COMMUNITY): Payer: Self-pay | Admitting: Emergency Medicine

## 2016-04-11 ENCOUNTER — Emergency Department (HOSPITAL_COMMUNITY)
Admission: EM | Admit: 2016-04-11 | Discharge: 2016-04-11 | Disposition: A | Discharge status: Home / Self Care | Payer: Medicare Other | Attending: Emergency Medicine | Admitting: Emergency Medicine

## 2016-04-11 ENCOUNTER — Emergency Department (HOSPITAL_COMMUNITY): Payer: Medicare Other

## 2016-04-11 DIAGNOSIS — J441 Chronic obstructive pulmonary disease with (acute) exacerbation: Secondary | ICD-10-CM

## 2016-04-11 DIAGNOSIS — Z79899 Other long term (current) drug therapy: Secondary | ICD-10-CM | POA: Diagnosis not present

## 2016-04-11 DIAGNOSIS — I5032 Chronic diastolic (congestive) heart failure: Secondary | ICD-10-CM | POA: Insufficient documentation

## 2016-04-11 DIAGNOSIS — I110 Hypertensive heart disease with heart failure: Secondary | ICD-10-CM | POA: Insufficient documentation

## 2016-04-11 DIAGNOSIS — Z7984 Long term (current) use of oral hypoglycemic drugs: Secondary | ICD-10-CM | POA: Diagnosis not present

## 2016-04-11 DIAGNOSIS — E119 Type 2 diabetes mellitus without complications: Secondary | ICD-10-CM | POA: Insufficient documentation

## 2016-04-11 DIAGNOSIS — E039 Hypothyroidism, unspecified: Secondary | ICD-10-CM | POA: Insufficient documentation

## 2016-04-11 DIAGNOSIS — R0602 Shortness of breath: Secondary | ICD-10-CM | POA: Diagnosis present

## 2016-04-11 LAB — BASIC METABOLIC PANEL
ANION GAP: 5 (ref 5–15)
BUN: 17 mg/dL (ref 6–20)
CHLORIDE: 107 mmol/L (ref 101–111)
CO2: 27 mmol/L (ref 22–32)
Calcium: 9.2 mg/dL (ref 8.9–10.3)
Creatinine, Ser: 1.13 mg/dL — ABNORMAL HIGH (ref 0.44–1.00)
GFR calc Af Amer: 49 mL/min — ABNORMAL LOW (ref >60)
GFR, EST NON AFRICAN AMERICAN: 42 mL/min — AB (ref >60)
GLUCOSE: 165 mg/dL — AB (ref 65–99)
POTASSIUM: 3.8 mmol/L (ref 3.5–5.1)
Sodium: 139 mmol/L (ref 135–145)

## 2016-04-11 LAB — CBC
HEMATOCRIT: 36.4 % (ref 36.0–46.0)
HEMOGLOBIN: 11.9 g/dL — AB (ref 12.0–15.0)
MCH: 30.1 pg (ref 26.0–34.0)
MCHC: 32.7 g/dL (ref 30.0–36.0)
MCV: 91.9 fL (ref 78.0–100.0)
PLATELETS: 263 10*3/uL (ref 150–400)
RBC: 3.96 MIL/uL (ref 3.87–5.11)
RDW: 15.2 % (ref 11.5–15.5)
WBC: 4.8 10*3/uL (ref 4.0–10.5)

## 2016-04-11 LAB — TROPONIN I: Troponin I: <0.03 ng/mL (ref <0.03)

## 2016-04-11 MED ORDER — ALBUTEROL SULFATE (2.5 MG/3ML) 0.083% IN NEBU
5.0000 mg | INHALATION_SOLUTION | Freq: Once | RESPIRATORY_TRACT | Status: AC
  Administered 2016-04-11: 5 mg via RESPIRATORY_TRACT
  Filled 2016-04-11: qty 6

## 2016-04-11 MED ORDER — PREDNISONE 20 MG PO TABS
40.0000 mg | ORAL_TABLET | Freq: Once | ORAL | Status: AC
  Administered 2016-04-11: 40 mg via ORAL
  Filled 2016-04-11: qty 2

## 2016-04-11 MED ORDER — IPRATROPIUM BROMIDE 0.02 % IN SOLN
0.5000 mg | Freq: Once | RESPIRATORY_TRACT | Status: AC
  Administered 2016-04-11: 0.5 mg via RESPIRATORY_TRACT
  Filled 2016-04-11: qty 2.5

## 2016-04-11 MED ORDER — PREDNISONE 20 MG PO TABS: 40.0000 mg | ORAL_TABLET | Freq: Every day | ORAL | 0 refills | Status: AC

## 2016-04-11 NOTE — ED Triage Notes (Signed)
Pt reports increasing SOB. Pt seen here approx 1 month ago but states she is not getting any better. Pt scheduled for esophageal dilation 12/4.

## 2016-04-11 NOTE — ED Provider Notes (Signed)
[facility] Provider Note   CSN: [id number] Arrival date & time: 04/11/16  1603     History   Chief Complaint Chief Complaint  Patient presents with  . Shortness of Breath    HPI [name] is a 81 y.o. female.  HPI Patient presents to my schwannoma to 3 days of shortness of breath.  No significant orthopnea.  No history of congestive heart failure.  She's tried her inhalers with some relief but not complete relief.  No fevers or chills.  No significant exertional shortness of breath.  Denies chest discomfort.  She is scheduled to see pulmonary in the next several weeks but felt worse over the past day or 2.  No recent vomiting or diarrhea.  No other complaints.   Past Medical History:  Diagnosis Date  . Anxiety   . Atrial fibrillation (HCC)   . Depression   . Hyperlipidemia   . Hypertension   . Hypothyroidism   . Mitral regurgitation    Mild  . Palpitations    PAT/EAT/NSVT, normal LVEF  . Type 2 diabetes mellitus [facility])     Patient Active Problem List   Diagnosis Date Noted  . GERD (gastroesophageal reflux disease) 03/17/2016  . Constipation 10/29/2015  . Gastric erosions 02/03/2015  . Sinus bradycardia 09/26/2014  . Dyspnea on exertion 09/18/2014  . Chronic diastolic heart failure (HCC) 09/18/2014  . Dizziness 09/18/2014  . Atrial flutter with rapid ventricular response (HCC)   . Atrial fibrillation with RVR (HCC) 09/09/2014  . Atrial flutter (HCC) 07/13/2014  . Hyponatremia 07/13/2014  . Diabetes (HCC) 07/13/2014  . Hypothyroidism 07/13/2014  . Tachycardia 07/13/2014  . Dysphagia, pharyngoesophageal phase 04/16/2014  . Neurogenic pain of lower extremity 03/15/2012  . Left bundle branch block 03/02/2011  . RENAL INSUFFICIENCY 05/22/2010  . Essential hypertension, benign 10/09/2009  . Mitral regurgitation 10/09/2009  . HYPERLIPIDEMIA-MIXED 02/12/2009  . Palpitations 02/12/2009    Past Surgical History:  Procedure Laterality Date  .  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 hiatal hernia. Antral erosions status post gastric biopsy.  Marland Kitchen MALONEY DILATION N/A 04/17/2014   Procedure: [name] DILATION;  Surgeon: Corbin Ade, MD;  Location: [facility];  Service: Endoscopy;  Laterality: N/A;  . none      OB History    Gravida Para Term Preterm AB Living   5 5 4 1   5    SAB TAB Ectopic Multiple Live Births                   Home Medications    Prior to Admission medications   Medication Sig Start Date End Date Taking? Authorizing Provider  Albuterol Sulfate (PROAIR RESPICLICK) 108 (90 Base) MCG/ACT AEPB Inhale 1-2 puffs into the lungs daily as needed (for shortness of breath).   Yes Historical Provider, MD  ALPRAZolam Prudy Feeler) 0.25 MG tablet Take 0.25 mg by mouth daily as needed for anxiety or sleep.    Yes Historical Provider, MD  amiodarone (PACERONE) 200 MG tablet TAKE 1 TABLET BY MOUTH EVERY DAY 07/29/15  Yes Jonelle Sidle, MD  atorvastatin (LIPITOR) 40 MG tablet Take 40 mg by mouth daily at 6 PM. 09/27/11  Yes Jonelle Sidle, MD  bisacodyl (DULCOLAX) 5 MG EC tablet Take 5 mg by mouth daily as needed for moderate constipation.   Yes Historical Provider, MD  budesonide-formoterol (SYMBICORT) 80-4.5 MCG/ACT inhaler Inhale 2 puffs into  the lungs daily.   Yes Historical Provider, MD  cetirizine (ZYRTEC) 10 MG tablet Take 5-10 mg by mouth daily.    Yes Historical Provider, MD  cloNIDine (CATAPRES) 0.1 MG tablet Take 1 tablet 0.1 mg daily in the evening. 02/25/16  Yes Jonelle SidleSamuel G McDowell, MD  ELIQUIS 2.5 MG TABS tablet TAKE 1 TABLET BY MOUTH TWICE DAILY 12/26/15  Yes Jonelle SidleSamuel G McDowell, MD  ferrous sulfate 325 (65 FE) MG tablet Take 325 mg by mouth daily with breakfast.   Yes Historical Provider, MD  guaiFENesin-dextromethorphan (ROBITUSSIN DM) 100-10 MG/5ML syrup Take 5 mLs by mouth every 4 (four) hours as needed for  cough (SUGAR FREE).   Yes Historical Provider, MD  levothyroxine (SYNTHROID, LEVOTHROID) 25 MCG tablet Take 25 mcg by mouth daily before breakfast.   Yes Historical Provider, MD  lisinopril (PRINIVIL,ZESTRIL) 20 MG tablet Take 1 tablet (20 mg total) by mouth 2 (two) times daily. 06/18/15  Yes Jonelle SidleSamuel G McDowell, MD  metoprolol tartrate (LOPRESSOR) 25 MG tablet TAKE 1 TABLET BY MOUTH TWICE DAILY 12/26/15  Yes Jonelle SidleSamuel G McDowell, MD  omeprazole (PRILOSEC) 20 MG capsule TAKE 1 CAPSULE BY MOUTH EVERY DAY 09/29/15  Yes Tiffany KocherLeslie S Lewis, PA-C  spironolactone (ALDACTONE) 25 MG tablet Take 1 tablet (25 mg total) by mouth daily. 02/25/16  Yes Jonelle SidleSamuel G McDowell, MD  glimepiride (AMARYL) 2 MG tablet Take 0.5 mg by mouth daily before breakfast.     Historical Provider, MD  predniSONE (DELTASONE) 20 MG tablet Take 2 tablets (40 mg total) by mouth daily. 04/11/16 04/16/16  Azalia BilisKevin Caleah Tortorelli, MD    Family History Family History  Problem Relation Age of Onset  . Heart failure Mother     Died in her 9870s  . Tuberculosis Father     Died at young age  . Cancer Brother   . Arthritis    . Colon cancer      Social History Social History  Substance Use Topics  . Smoking status: Never Smoker  . Smokeless tobacco: Never Used  . Alcohol use No     Allergies   Chocolate   Review of Systems Review of Systems  All other systems reviewed and are negative.    Physical Exam Updated Vital Signs BP (!) 121/38   Pulse (!) 52   Temp 97.8 F (36.6 C) (Oral)   Resp 20   Ht 5\' 6"  (1.676 m)   Wt 157 lb (71.2 kg)   SpO2 100%   BMI 25.34 kg/m   Physical Exam  Constitutional: She is oriented to person, place, and time. She appears well-developed and well-nourished. No distress.  HENT:  Head: Normocephalic and atraumatic.  Eyes: EOM are normal.  Neck: Normal range of motion.  Cardiovascular: Normal rate, regular rhythm and normal heart sounds.   Pulmonary/Chest: Effort normal. She has wheezes.  Abdominal: Soft.  She exhibits no distension. There is no tenderness.  Musculoskeletal: Normal range of motion.  Neurological: She is alert and oriented to person, place, and time.  Skin: Skin is warm and dry.  Psychiatric: She has a normal mood and affect. Judgment normal.  Nursing note and vitals reviewed.    ED Treatments / Results  Labs (all labs ordered are listed, but only abnormal results are displayed) Labs Reviewed  CBC - Abnormal; Notable for the following:       Result Value   Hemoglobin 11.9 (*)    All other components within normal limits  BASIC METABOLIC PANEL - Abnormal; Notable for  the following:    Glucose, Bld 165 (*)    Creatinine, Ser 1.13 (*)    GFR calc non Af Amer 42 (*)    GFR calc Af Amer 49 (*)    All other components within normal limits  TROPONIN I    EKG  EKG Interpretation  Date/Time:  Sunday April 11 2016 16:14:12 EST Ventricular Rate:  71 PR Interval:  164 QRS Duration: 142 QT Interval:  442 QTC Calculation: 480 R Axis:   34 Text Interpretation:  Normal sinus rhythm Left bundle branch block Abnormal ECG No significant change was found Confirmed by Karisa Nesser  MD, Caryn BeeKEVIN ([id number]) on 04/11/2016 5:09:28 PM       Radiology Dg Chest 2 View  Result Date: 04/11/2016 CLINICAL DATA:  Shortness of breath EXAM: CHEST  2 VIEW COMPARISON:  03/14/2016 chest radiograph. FINDINGS: Stable cardiomediastinal silhouette with normal heart size and aortic atherosclerosis. No pneumothorax. No pleural effusion. Mildly hyperinflated lungs. No pulmonary edema. No acute consolidative airspace disease. IMPRESSION: 1. Mildly hyperinflated lungs, suggesting obstructive lung disease. 2. Otherwise no active cardiopulmonary disease . 3. Aortic atherosclerosis. Electronically Signed   By: Delbert PhenixJason A Poff M.D.   On: 04/11/2016 17:09    Procedures Procedures (including critical care time)  Medications Ordered in ED Medications  albuterol (PROVENTIL) (2.5 MG/3ML) 0.083% nebulizer solution 5  mg (5 mg Nebulization Given 04/11/16 1629)  albuterol (PROVENTIL) (2.5 MG/3ML) 0.083% nebulizer solution 5 mg (5 mg Nebulization Given 04/11/16 1718)  ipratropium (ATROVENT) nebulizer solution 0.5 mg (0.5 mg Nebulization Given 04/11/16 1718)  predniSONE (DELTASONE) tablet 40 mg (40 mg Oral Given 04/11/16 1807)     Initial Impression / Assessment and Plan / ED Course  I have reviewed the triage vital signs and the nursing notes.  Pertinent labs & imaging results that were available during my care of the patient were reviewed by me and considered in my medical decision making (see chart for details).  Clinical Course     6:24 PM Jamesetta Sohyllis much better this time after bronchodilators.  Patient was placed on a steroid burst.  Outpatient primary care and pulmonary follow-up and she understands returned a year for a worsening symptoms.  No hypoxia.  No increased work of breathing.  I do not attention his admission the hospital for additional workup at this time.  Doubt PE.  Final Clinical Impressions(s) / ED Diagnoses   Final diagnoses:  COPD exacerbation (HCC)    New Prescriptions New Prescriptions   PREDNISONE (DELTASONE) 20 MG TABLET    Take 2 tablets (40 mg total) by mouth daily.     Azalia BilisKevin Lashan Gluth, MD 04/11/16 [phone]

## 2016-04-18 ENCOUNTER — Encounter (HOSPITAL_COMMUNITY): Payer: Self-pay | Admitting: Emergency Medicine

## 2016-04-18 ENCOUNTER — Emergency Department (HOSPITAL_COMMUNITY): Payer: Medicare Other

## 2016-04-18 ENCOUNTER — Emergency Department (HOSPITAL_COMMUNITY)
Admission: EM | Admit: 2016-04-18 | Discharge: 2016-04-18 | Disposition: A | Discharge status: Home / Self Care | Payer: Medicare Other | Attending: Emergency Medicine | Admitting: Emergency Medicine

## 2016-04-18 DIAGNOSIS — J441 Chronic obstructive pulmonary disease with (acute) exacerbation: Secondary | ICD-10-CM | POA: Insufficient documentation

## 2016-04-18 DIAGNOSIS — E039 Hypothyroidism, unspecified: Secondary | ICD-10-CM | POA: Diagnosis not present

## 2016-04-18 DIAGNOSIS — E119 Type 2 diabetes mellitus without complications: Secondary | ICD-10-CM | POA: Insufficient documentation

## 2016-04-18 DIAGNOSIS — I110 Hypertensive heart disease with heart failure: Secondary | ICD-10-CM | POA: Insufficient documentation

## 2016-04-18 DIAGNOSIS — Z7984 Long term (current) use of oral hypoglycemic drugs: Secondary | ICD-10-CM | POA: Diagnosis not present

## 2016-04-18 DIAGNOSIS — I5032 Chronic diastolic (congestive) heart failure: Secondary | ICD-10-CM | POA: Diagnosis not present

## 2016-04-18 DIAGNOSIS — R0602 Shortness of breath: Secondary | ICD-10-CM | POA: Diagnosis present

## 2016-04-18 DIAGNOSIS — Z79899 Other long term (current) drug therapy: Secondary | ICD-10-CM | POA: Insufficient documentation

## 2016-04-18 HISTORY — DX: Chronic obstructive pulmonary disease, unspecified: J44.9

## 2016-04-18 LAB — URINE MICROSCOPIC-ADD ON

## 2016-04-18 LAB — BASIC METABOLIC PANEL
ANION GAP: 7 (ref 5–15)
BUN: 23 mg/dL — AB (ref 6–20)
CALCIUM: 9 mg/dL (ref 8.9–10.3)
CO2: 26 mmol/L (ref 22–32)
Chloride: 100 mmol/L — ABNORMAL LOW (ref 101–111)
Creatinine, Ser: 1.04 mg/dL — ABNORMAL HIGH (ref 0.44–1.00)
GFR calc Af Amer: 54 mL/min — ABNORMAL LOW (ref >60)
GFR, EST NON AFRICAN AMERICAN: 47 mL/min — AB (ref >60)
GLUCOSE: 86 mg/dL (ref 65–99)
Potassium: 3.5 mmol/L (ref 3.5–5.1)
Sodium: 133 mmol/L — ABNORMAL LOW (ref 135–145)

## 2016-04-18 LAB — URINALYSIS, ROUTINE W REFLEX MICROSCOPIC
Bilirubin Urine: NEGATIVE
Glucose, UA: NEGATIVE mg/dL
Ketones, ur: NEGATIVE mg/dL
Nitrite: NEGATIVE
PH: 6 (ref 5.0–8.0)
Protein, ur: NEGATIVE mg/dL

## 2016-04-18 LAB — TROPONIN I: Troponin I: <0.03 ng/mL (ref <0.03)

## 2016-04-18 LAB — BRAIN NATRIURETIC PEPTIDE: B Natriuretic Peptide: 85 pg/mL (ref 0.0–100.0)

## 2016-04-18 MED ORDER — PREDNISONE 10 MG (21) PO TBPK: 10.0000 mg | ORAL_TABLET | Freq: Every day | ORAL | 0 refills | Status: DC

## 2016-04-18 MED ORDER — IPRATROPIUM-ALBUTEROL 0.5-2.5 (3) MG/3ML IN SOLN
3.0000 mL | Freq: Once | RESPIRATORY_TRACT | Status: AC
  Administered 2016-04-18: 3 mL via RESPIRATORY_TRACT
  Filled 2016-04-18: qty 3

## 2016-04-18 MED ORDER — METHYLPREDNISOLONE SODIUM SUCC 125 MG IJ SOLR
125.0000 mg | Freq: Once | INTRAMUSCULAR | Status: AC
  Administered 2016-04-18: 125 mg via INTRAVENOUS
  Filled 2016-04-18: qty 2

## 2016-04-18 NOTE — ED Provider Notes (Signed)
[facility]-EMERGENCY DEPT Provider Note   CSN: [id number] Arrival date & time: 04/18/16  1517     History   Chief Complaint Chief Complaint  Patient presents with  . Shortness of Breath    HPI [name] is a 82 y.o. female.  Pt presents to the ED today with sob.  She has a hx of COPD and was here last week for the same.  She was d/c'd on prednisone which was finished on Friday, 11/24.  Since she has been off the prednisone, her sob has been back.      Past Medical History:  Diagnosis Date  . Anxiety   . Atrial fibrillation (HCC)   . COPD (chronic obstructive pulmonary disease) (HCC)   . Depression   . Hyperlipidemia   . Hypertension   . Hypothyroidism   . Mitral regurgitation    Mild  . Palpitations    PAT/EAT/NSVT, normal LVEF  . Type 2 diabetes mellitus [facility](HCC)     Patient Active Problem List   Diagnosis Date Noted  . GERD (gastroesophageal reflux disease) 03/17/2016  . Constipation 10/29/2015  . Gastric erosions 02/03/2015  . Sinus bradycardia 09/26/2014  . Dyspnea on exertion 09/18/2014  . Chronic diastolic heart failure (HCC) 09/18/2014  . Dizziness 09/18/2014  . Atrial flutter with rapid ventricular response (HCC)   . Atrial fibrillation with RVR (HCC) 09/09/2014  . Atrial flutter (HCC) 07/13/2014  . Hyponatremia 07/13/2014  . Diabetes (HCC) 07/13/2014  . Hypothyroidism 07/13/2014  . Tachycardia 07/13/2014  . Dysphagia, pharyngoesophageal phase 04/16/2014  . Neurogenic pain of lower extremity 03/15/2012  . Left bundle branch block 03/02/2011  . RENAL INSUFFICIENCY 05/22/2010  . Essential hypertension, benign 10/09/2009  . Mitral regurgitation 10/09/2009  . HYPERLIPIDEMIA-MIXED 02/12/2009  . Palpitations 02/12/2009    Past Surgical History:  Procedure Laterality Date  . 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 hiatal hernia. Antral  erosions status post gastric biopsy.  Marland Kitchen. MALONEY DILATION N/A 04/17/2014   Procedure: Elease HashimotoMALONEY DILATION;  Surgeon: Corbin Adeobert M Rourk, MD;  Location: [facility] ENDO SUITE;  Service: Endoscopy;  Laterality: N/A;  . none      OB History    Gravida Para Term Preterm AB Living   5 5 4 1   5    SAB TAB Ectopic Multiple Live Births                   Home Medications    Prior to Admission medications   Medication Sig Start Date End Date Taking? Authorizing Provider  Albuterol Sulfate (PROAIR RESPICLICK) 108 (90 Base) MCG/ACT AEPB Inhale 1-2 puffs into the lungs daily as needed (for shortness of breath).   Yes Historical Provider, MD  ALPRAZolam Prudy Feeler(XANAX) 0.25 MG tablet Take 0.25 mg by mouth daily as needed for anxiety or sleep.    Yes Historical Provider, MD  amiodarone (PACERONE) 200 MG tablet TAKE 1 TABLET BY MOUTH EVERY DAY 07/29/15  Yes Jonelle SidleSamuel G McDowell, MD  atorvastatin (LIPITOR) 40 MG tablet Take 40 mg by mouth daily at 6 PM. 09/27/11  Yes Jonelle SidleSamuel G McDowell, MD  bisacodyl (DULCOLAX) 5 MG EC tablet Take 5 mg by mouth daily as needed for moderate constipation.   Yes Historical Provider, MD  budesonide-formoterol (SYMBICORT) 80-4.5 MCG/ACT inhaler Inhale 2 puffs into the lungs daily.   Yes Historical Provider, MD  cetirizine (ZYRTEC) 10 MG tablet Take 5-10 mg by mouth daily.  Yes Historical Provider, MD  cloNIDine (CATAPRES) 0.1 MG tablet Take 1 tablet 0.1 mg daily in the evening. 02/25/16  Yes Jonelle Sidle, MD  ELIQUIS 2.5 MG TABS tablet TAKE 1 TABLET BY MOUTH TWICE DAILY 12/26/15  Yes Jonelle Sidle, MD  ferrous sulfate 325 (65 FE) MG tablet Take 325 mg by mouth daily with breakfast.   Yes Historical Provider, MD  furosemide (LASIX) 20 MG tablet Take 20 mg by mouth once as needed for fluid.   Yes Historical Provider, MD  glimepiride (AMARYL) 1 MG tablet Take 1 mg by mouth daily before breakfast.    Yes Historical Provider, MD  guaiFENesin-dextromethorphan (ROBITUSSIN DM) 100-10 MG/5ML syrup Take 5  mLs by mouth every 4 (four) hours as needed for cough (SUGAR FREE).   Yes Historical Provider, MD  levothyroxine (SYNTHROID, LEVOTHROID) 25 MCG tablet Take 25 mcg by mouth daily before breakfast.   Yes Historical Provider, MD  lisinopril (PRINIVIL,ZESTRIL) 20 MG tablet Take 1 tablet (20 mg total) by mouth 2 (two) times daily. 06/18/15  Yes Jonelle Sidle, MD  metoprolol tartrate (LOPRESSOR) 25 MG tablet TAKE 1 TABLET BY MOUTH TWICE DAILY 12/26/15  Yes Jonelle Sidle, MD  omeprazole (PRILOSEC) 20 MG capsule TAKE 1 CAPSULE BY MOUTH EVERY DAY 09/29/15  Yes Tiffany Kocher, PA-C  spironolactone (ALDACTONE) 25 MG tablet Take 1 tablet (25 mg total) by mouth daily. 02/25/16  Yes Jonelle Sidle, MD  predniSONE (STERAPRED UNI-PAK 21 TAB) 10 MG (21) TBPK tablet Take 1 tablet (10 mg total) by mouth daily. Take 6 tabs by mouth daily  for 2 days, then 5 tabs for 2 days, then 4 tabs for 2 days, then 3 tabs for 2 days, 2 tabs for 2 days, then 1 tab by mouth daily for 2 days 04/18/16   Jacalyn Lefevre, MD    Family History Family History  Problem Relation Age of Onset  . Heart failure Mother     Died in her 37s  . Tuberculosis Father     Died at young age  . Cancer Brother   . Arthritis    . Colon cancer      Social History Social History  Substance Use Topics  . Smoking status: Never Smoker  . Smokeless tobacco: Never Used  . Alcohol use No     Allergies   Chocolate   Review of Systems Review of Systems  Respiratory: Positive for cough and shortness of breath.   All other systems reviewed and are negative.    Physical Exam Updated Vital Signs BP 183/61   Pulse (!) 58   Temp 98 F (36.7 C) (Oral)   Resp 13   Ht 5\' 6"  (1.676 m)   Wt 150 lb (68 kg)   SpO2 100%   BMI 24.21 kg/m   Physical Exam  Constitutional: She is oriented to person, place, and time. She appears well-developed and well-nourished.  HENT:  Head: Normocephalic and atraumatic.  Right Ear: External ear normal.    Left Ear: External ear normal.  Nose: Nose normal.  Mouth/Throat: Oropharynx is clear and moist.  Eyes: Conjunctivae and EOM are normal. Pupils are equal, round, and reactive to light.  Neck: Normal range of motion. Neck supple.  Cardiovascular: Normal rate, regular rhythm, normal heart sounds and intact distal pulses.   Pulmonary/Chest: Effort normal. She has wheezes.  Abdominal: Soft. Bowel sounds are normal.  Musculoskeletal: Normal range of motion.  Neurological: She is alert and oriented to person,  place, and time.  Skin: Skin is warm.  Psychiatric: She has a normal mood and affect. Her behavior is normal. Judgment and thought content normal.  Nursing note and vitals reviewed.    ED Treatments / Results  Labs (all labs ordered are listed, but only abnormal results are displayed) Labs Reviewed  BASIC METABOLIC PANEL - Abnormal; Notable for the following:       Result Value   Sodium 133 (*)    Chloride 100 (*)    BUN 23 (*)    Creatinine, Ser 1.04 (*)    GFR calc non Af Amer 47 (*)    GFR calc Af Amer 54 (*)    All other components within normal limits  URINALYSIS, ROUTINE W REFLEX MICROSCOPIC (NOT AT [facility]) - Abnormal; Notable for the following:    Specific Gravity, Urine <1.005 (*)    Hgb urine dipstick TRACE (*)    Leukocytes, UA LARGE (*)    All other components within normal limits  URINE MICROSCOPIC-ADD ON - Abnormal; Notable for the following:    Squamous Epithelial / LPF 6-30 (*)    Bacteria, UA FEW (*)    All other components within normal limits  BRAIN NATRIURETIC PEPTIDE  TROPONIN I    EKG  EKG Interpretation  Date/Time:  Sunday April 18 2016 15:39:54 EST Ventricular Rate:  65 PR Interval:  142 QRS Duration: 142 QT Interval:  460 QTC Calculation: 478 R Axis:   48 Text Interpretation:  Normal sinus rhythm Left bundle branch block Abnormal ECG No significant change since last tracing Confirmed by [facility] MD, Sufyan Meidinger ([id number]) on 04/18/2016 5:31:58 PM        Radiology Dg Chest 2 View  Result Date: 04/18/2016 CLINICAL DATA:  Shortness of Breath EXAM: CHEST  2 VIEW COMPARISON:  04/11/2016 FINDINGS: Cardiomediastinal silhouette is stable. No acute infiltrate or pleural effusion. No pulmonary edema. Mild hyperinflation again noted. Degenerative changes thoracic spine. IMPRESSION: No active cardiopulmonary disease. Mild hyperinflation. Degenerative changes thoracic spine. Electronically Signed   By: Natasha MeadLiviu  Pop M.D.   On: 04/18/2016 17:37    Procedures Procedures (including critical care time)  Medications Ordered in ED Medications  ipratropium-albuterol (DUONEB) 0.5-2.5 (3) MG/3ML nebulizer solution 3 mL (3 mLs Nebulization Given 04/18/16 1812)  methylPREDNISolone sodium succinate (SOLU-MEDROL) 125 mg/2 mL injection 125 mg (125 mg Intravenous Given 04/18/16 1723)     Initial Impression / Assessment and Plan / ED Course  I have reviewed the triage vital signs and the nursing notes.  Pertinent labs & imaging results that were available during my care of the patient were reviewed by me and considered in my medical decision making (see chart for details).  Clinical Course    Pt is feeling much better.  She will be d/c'd on a long prednisone taper.  She knows to return if worse.   Final Clinical Impressions(s) / ED Diagnoses   Final diagnoses:  COPD exacerbation (HCC)    New Prescriptions New Prescriptions   PREDNISONE (STERAPRED UNI-PAK 21 TAB) 10 MG (21) TBPK TABLET    Take 1 tablet (10 mg total) by mouth daily. Take 6 tabs by mouth daily  for 2 days, then 5 tabs for 2 days, then 4 tabs for 2 days, then 3 tabs for 2 days, 2 tabs for 2 days, then 1 tab by mouth daily for 2 days     Jacalyn LefevreJulie Mertis Mosher, MD 04/18/16 1944

## 2016-04-18 NOTE — ED Triage Notes (Addendum)
Patient c/o shortness of breath. Patient seen here in ER, diagnosed with COPD, patient given breathing treatments in ER  and discharged with prescription of prednisone. Denies any chest pain. Per family patient has increased swelling in legs. Per family patient has had a productive cough. Denies any fevers.

## 2016-04-22 ENCOUNTER — Encounter (HOSPITAL_COMMUNITY): Payer: Self-pay | Admitting: Cardiology

## 2016-04-22 ENCOUNTER — Emergency Department (HOSPITAL_COMMUNITY)
Admission: EM | Admit: 2016-04-22 | Discharge: 2016-04-22 | Disposition: A | Discharge status: Home / Self Care | Payer: Medicare Other | Attending: Emergency Medicine | Admitting: Emergency Medicine

## 2016-04-22 ENCOUNTER — Emergency Department (HOSPITAL_COMMUNITY): Payer: Medicare Other

## 2016-04-22 DIAGNOSIS — E039 Hypothyroidism, unspecified: Secondary | ICD-10-CM | POA: Diagnosis not present

## 2016-04-22 DIAGNOSIS — Z79899 Other long term (current) drug therapy: Secondary | ICD-10-CM | POA: Diagnosis not present

## 2016-04-22 DIAGNOSIS — J449 Chronic obstructive pulmonary disease, unspecified: Secondary | ICD-10-CM | POA: Diagnosis not present

## 2016-04-22 DIAGNOSIS — I5032 Chronic diastolic (congestive) heart failure: Secondary | ICD-10-CM | POA: Diagnosis not present

## 2016-04-22 DIAGNOSIS — R0602 Shortness of breath: Secondary | ICD-10-CM | POA: Diagnosis present

## 2016-04-22 DIAGNOSIS — Z7984 Long term (current) use of oral hypoglycemic drugs: Secondary | ICD-10-CM | POA: Insufficient documentation

## 2016-04-22 DIAGNOSIS — I110 Hypertensive heart disease with heart failure: Secondary | ICD-10-CM | POA: Insufficient documentation

## 2016-04-22 DIAGNOSIS — E119 Type 2 diabetes mellitus without complications: Secondary | ICD-10-CM | POA: Diagnosis not present

## 2016-04-22 LAB — TROPONIN I: Troponin I: <0.03 ng/mL (ref <0.03)

## 2016-04-22 LAB — COMPREHENSIVE METABOLIC PANEL
ALBUMIN: 3.2 g/dL — AB (ref 3.5–5.0)
ALT: 30 U/L (ref 14–54)
ANION GAP: 7 (ref 5–15)
AST: 26 U/L (ref 15–41)
Alkaline Phosphatase: 50 U/L (ref 38–126)
BUN: 50 mg/dL — AB (ref 6–20)
CHLORIDE: 105 mmol/L (ref 101–111)
CO2: 25 mmol/L (ref 22–32)
Calcium: 8.5 mg/dL — ABNORMAL LOW (ref 8.9–10.3)
Creatinine, Ser: 1.56 mg/dL — ABNORMAL HIGH (ref 0.44–1.00)
GFR calc Af Amer: 33 mL/min — ABNORMAL LOW (ref >60)
GFR, EST NON AFRICAN AMERICAN: 29 mL/min — AB (ref >60)
GLUCOSE: 151 mg/dL — AB (ref 65–99)
POTASSIUM: 3.7 mmol/L (ref 3.5–5.1)
Sodium: 137 mmol/L (ref 135–145)
Total Bilirubin: 0.6 mg/dL (ref 0.3–1.2)
Total Protein: 6.3 g/dL — ABNORMAL LOW (ref 6.5–8.1)

## 2016-04-22 LAB — CBC WITH DIFFERENTIAL/PLATELET
BASOS ABS: 0 10*3/uL (ref 0.0–0.1)
BASOS PCT: 0 %
EOS PCT: 0 %
Eosinophils Absolute: 0 10*3/uL (ref 0.0–0.7)
HCT: 38.1 % (ref 36.0–46.0)
Hemoglobin: 12.5 g/dL (ref 12.0–15.0)
Lymphocytes Relative: 2 %
Lymphs Abs: 0.3 10*3/uL — ABNORMAL LOW (ref 0.7–4.0)
MCH: 30 pg (ref 26.0–34.0)
MCHC: 32.8 g/dL (ref 30.0–36.0)
MCV: 91.4 fL (ref 78.0–100.0)
MONO ABS: 0.6 10*3/uL (ref 0.1–1.0)
Monocytes Relative: 4 %
Neutro Abs: 12.8 10*3/uL — ABNORMAL HIGH (ref 1.7–7.7)
Neutrophils Relative %: 94 %
PLATELETS: 264 10*3/uL (ref 150–400)
RBC: 4.17 MIL/uL (ref 3.87–5.11)
RDW: 15.1 % (ref 11.5–15.5)
WBC: 13.6 10*3/uL — ABNORMAL HIGH (ref 4.0–10.5)

## 2016-04-22 LAB — BRAIN NATRIURETIC PEPTIDE: B Natriuretic Peptide: 62 pg/mL (ref 0.0–100.0)

## 2016-04-22 MED ORDER — SODIUM CHLORIDE 0.9 % IV BOLUS (SEPSIS)
250.0000 mL | Freq: Once | INTRAVENOUS | Status: AC
  Administered 2016-04-22: 250 mL via INTRAVENOUS

## 2016-04-22 NOTE — ED Triage Notes (Signed)
Sob.  States sob has been for  "a while" and she has been seen here in the er several times for sob.  Diarrhea all night.  And c/o weakness.

## 2016-04-22 NOTE — Discharge Instructions (Signed)
Continue your current medications. Return for any new or worse symptoms. Today's workup without any explanation for your feeling of shortness of breath. Oxygen levels have been very good.

## 2016-04-22 NOTE — ED Provider Notes (Signed)
[facility]-EM[facility] DEPT Provider Note   CSN: [id number] Arrival date & time: 04/22/16  [id number]  By signing my name below, I, Placido SouLogan Joldersma, attest that this documentation has been prepared under the direction and in the presence of Vanetta MuldersScott Ruben Pyka, MD. Electronically Signed: Placido SouLogan Joldersma, ED Scribe. 04/22/16. 10:02 AM.   History   Chief Complaint Chief Complaint  Patient presents with  . Shortness of Breath    [place] CAVEAT: POOR HISTORIAN  HPI HPI Comments: [name] is a 82 y.o. female with a h/o COPD who presents to the Emergency Department complaining of constant, moderate, SOB onset the past few days. Pt was seen on 11/26 and 11/29 with SOB consistent with prior COPD exacerbations. She was d/c with prednisone during both prior visits. She confirms having taken her prednisone. Pt also reports persistent diarrhea onset last night and generalized weakness. She is on anticoagulation. No other associated complaints at this time.    The history is provided by the patient, medical records and a relative. No language interpreter was used.  Shortness of Breath  This is a recurrent problem. The problem occurs frequently.The problem has not changed since onset.She has had prior hospitalizations. She has had prior ED visits. Associated medical issues include COPD.    Past Medical History:  Diagnosis Date  . Anxiety   . Atrial fibrillation (HCC)   . COPD (chronic obstructive pulmonary disease) (HCC)   . Depression   . Hyperlipidemia   . Hypertension   . Hypothyroidism   . Mitral regurgitation    Mild  . Palpitations    PAT/EAT/NSVT, normal LVEF  . Type 2 diabetes mellitus [facility](HCC)     Patient Active Problem List   Diagnosis Date Noted  . GERD (gastroesophageal reflux disease) 03/17/2016  . Constipation 10/29/2015  . Gastric erosions 02/03/2015  . Sinus bradycardia 09/26/2014  . Dyspnea on exertion 09/18/2014  . Chronic diastolic heart failure (HCC) 09/18/2014  .  Dizziness 09/18/2014  . Atrial flutter with rapid ventricular response (HCC)   . Atrial fibrillation with RVR (HCC) 09/09/2014  . Atrial flutter (HCC) 07/13/2014  . Hyponatremia 07/13/2014  . Diabetes (HCC) 07/13/2014  . Hypothyroidism 07/13/2014  . Tachycardia 07/13/2014  . Dysphagia, pharyngoesophageal phase 04/16/2014  . Neurogenic pain of lower extremity 03/15/2012  . Left bundle branch block 03/02/2011  . RENAL INSUFFICIENCY 05/22/2010  . Essential hypertension, benign 10/09/2009  . Mitral regurgitation 10/09/2009  . HYPERLIPIDEMIA-MIXED 02/12/2009  . Palpitations 02/12/2009    Past Surgical History:  Procedure Laterality Date  . 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 hiatal hernia. Antral erosions status post gastric biopsy.  Marland Kitchen. MALONEY DILATION N/A 04/17/2014   Procedure: Elease HashimotoMALONEY DILATION;  Surgeon: Corbin Adeobert M Rourk, MD;  Location: [facility] ENDO SUITE;  Service: Endoscopy;  Laterality: N/A;  . none      OB History    Gravida Para Term Preterm AB Living   5 5 4 1   5    SAB TAB Ectopic Multiple Live Births                   Home Medications    Prior to Admission medications   Medication Sig Start Date End Date Taking? Authorizing Provider  Albuterol Sulfate (PROAIR RESPICLICK) 108 (90 Base) MCG/ACT AEPB Inhale 1-2 puffs into the lungs daily as needed (for shortness of breath).    Historical Provider, MD  ALPRAZolam Prudy Feeler(XANAX) 0.25 MG tablet Take  0.25 mg by mouth daily as needed for anxiety or sleep.     Historical Provider, MD  amiodarone (PACERONE) 200 MG tablet TAKE 1 TABLET BY MOUTH EVERY DAY 07/29/15   Jonelle SidleSamuel G McDowell, MD  atorvastatin (LIPITOR) 40 MG tablet Take 40 mg by mouth daily at 6 PM. 09/27/11   Jonelle SidleSamuel G McDowell, MD  bisacodyl (DULCOLAX) 5 MG EC tablet Take 5 mg by mouth daily as needed for moderate constipation.    Historical Provider, MD    budesonide-formoterol (SYMBICORT) 80-4.5 MCG/ACT inhaler Inhale 2 puffs into the lungs daily.    Historical Provider, MD  cetirizine (ZYRTEC) 10 MG tablet Take 5-10 mg by mouth daily.     Historical Provider, MD  cloNIDine (CATAPRES) 0.1 MG tablet Take 1 tablet 0.1 mg daily in the evening. 02/25/16   Jonelle SidleSamuel G McDowell, MD  ELIQUIS 2.5 MG TABS tablet TAKE 1 TABLET BY MOUTH TWICE DAILY 12/26/15   Jonelle SidleSamuel G McDowell, MD  ferrous sulfate 325 (65 FE) MG tablet Take 325 mg by mouth daily with breakfast.    Historical Provider, MD  furosemide (LASIX) 20 MG tablet Take 20 mg by mouth once as needed for fluid.    Historical Provider, MD  glimepiride (AMARYL) 1 MG tablet Take 1 mg by mouth daily before breakfast.     Historical Provider, MD  guaiFENesin-dextromethorphan (ROBITUSSIN DM) 100-10 MG/5ML syrup Take 5 mLs by mouth every 4 (four) hours as needed for cough (SUGAR FREE).    Historical Provider, MD  levothyroxine (SYNTHROID, LEVOTHROID) 25 MCG tablet Take 25 mcg by mouth daily before breakfast.    Historical Provider, MD  lisinopril (PRINIVIL,ZESTRIL) 20 MG tablet Take 1 tablet (20 mg total) by mouth 2 (two) times daily. 06/18/15   Jonelle SidleSamuel G McDowell, MD  metoprolol tartrate (LOPRESSOR) 25 MG tablet TAKE 1 TABLET BY MOUTH TWICE DAILY 12/26/15   Jonelle SidleSamuel G McDowell, MD  omeprazole (PRILOSEC) 20 MG capsule TAKE 1 CAPSULE BY MOUTH EVERY DAY 09/29/15   Tiffany KocherLeslie S Lewis, PA-C  predniSONE (STERAPRED UNI-PAK 21 TAB) 10 MG (21) TBPK tablet Take 1 tablet (10 mg total) by mouth daily. Take 6 tabs by mouth daily  for 2 days, then 5 tabs for 2 days, then 4 tabs for 2 days, then 3 tabs for 2 days, 2 tabs for 2 days, then 1 tab by mouth daily for 2 days 04/18/16   Jacalyn LefevreJulie Haviland, MD  spironolactone (ALDACTONE) 25 MG tablet Take 1 tablet (25 mg total) by mouth daily. 02/25/16   Jonelle SidleSamuel G McDowell, MD    Family History Family History  Problem Relation Age of Onset  . Heart failure Mother     Died in her 4570s  . Tuberculosis  Father     Died at young age  . Cancer Brother   . Arthritis    . Colon cancer      Social History Social History  Substance Use Topics  . Smoking status: Never Smoker  . Smokeless tobacco: Never Used  . Alcohol use No     Allergies   Chocolate   Review of Systems Review of Systems  Unable to perform ROS: Other   Physical Exam Updated Vital Signs Ht 5\' 6"  (1.676 m)   Wt 150 lb (68 kg)   BMI 24.21 kg/m   Physical Exam  Constitutional: She is oriented to person, place, and time. She appears well-developed and well-nourished. No distress.  HENT:  Head: Normocephalic and atraumatic.  Mouth/Throat: Oropharynx is clear and moist.  Eyes: Conjunctivae and EOM are normal. Pupils are equal, round, and reactive to light.  Neck: Normal range of motion. Neck supple. No tracheal deviation present.  Cardiovascular: Normal rate, regular rhythm, normal heart sounds and intact distal pulses.  Exam reveals no gallop and no friction rub.   No murmur heard. Pulmonary/Chest: Breath sounds normal. No respiratory distress. She has no wheezes. She has no rales.  100% on RA during examination  Abdominal: Soft. Bowel sounds are normal.  Musculoskeletal: Normal range of motion. She exhibits edema.  Pitting edema noted to bilateral ankles  Neurological: She is alert and oriented to person, place, and time. No cranial nerve deficit. She exhibits normal muscle tone. Coordination normal.  Able to move fingers and toes normally   Skin: Skin is warm and dry.  Psychiatric: She has a normal mood and affect. Her behavior is normal.  Nursing note and vitals reviewed.  ED Treatments / Results  Labs (all labs ordered are listed, but only abnormal results are displayed) Labs Reviewed  CBC WITH DIFFERENTIAL/PLATELET - Abnormal; Notable for the following:       Result Value   WBC 13.6 (*)    Neutro Abs 12.8 (*)    Lymphs Abs 0.3 (*)    All other components within normal limits  COMPREHENSIVE  METABOLIC PANEL - Abnormal; Notable for the following:    Glucose, Bld 151 (*)    BUN 50 (*)    Creatinine, Ser 1.56 (*)    Calcium 8.5 (*)    Total Protein 6.3 (*)    Albumin 3.2 (*)    GFR calc non Af Amer 29 (*)    GFR calc Af Amer 33 (*)    All other components within normal limits  BRAIN NATRIURETIC PEPTIDE  TROPONIN I   Results for orders placed or performed during the hospital encounter of 04/22/16  CBC with Differential  Result Value Ref Range   WBC 13.6 (H) 4.0 - 10.5 K/uL   RBC 4.17 3.87 - 5.11 MIL/uL   Hemoglobin 12.5 12.0 - 15.0 g/dL   HCT 16.1 09.6 - 04.5 %   MCV 91.4 78.0 - 100.0 fL   MCH 30.0 26.0 - 34.0 pg   MCHC 32.8 30.0 - 36.0 g/dL   RDW 40.9 81.1 - 91.4 %   Platelets 264 150 - 400 K/uL   Neutrophils Relative % 94 %   Neutro Abs 12.8 (H) 1.7 - 7.7 K/uL   Lymphocytes Relative 2 %   Lymphs Abs 0.3 (L) 0.7 - 4.0 K/uL   Monocytes Relative 4 %   Monocytes Absolute 0.6 0.1 - 1.0 K/uL   Eosinophils Relative 0 %   Eosinophils Absolute 0.0 0.0 - 0.7 K/uL   Basophils Relative 0 %   Basophils Absolute 0.0 0.0 - 0.1 K/uL  Comprehensive metabolic panel  Result Value Ref Range   Sodium 137 135 - 145 mmol/L   Potassium 3.7 3.5 - 5.1 mmol/L   Chloride 105 101 - 111 mmol/L   CO2 25 22 - 32 mmol/L   Glucose, Bld 151 (H) 65 - 99 mg/dL   BUN 50 (H) 6 - 20 mg/dL   Creatinine, Ser 7.82 (H) 0.44 - 1.00 mg/dL   Calcium 8.5 (L) 8.9 - 10.3 mg/dL   Total Protein 6.3 (L) 6.5 - 8.1 g/dL   Albumin 3.2 (L) 3.5 - 5.0 g/dL   AST 26 15 - 41 U/L   ALT 30 14 - 54 U/L   Alkaline Phosphatase 50 38 -  126 U/L   Total Bilirubin 0.6 0.3 - 1.2 mg/dL   GFR calc non Af Amer 29 (L) >60 mL/min   GFR calc Af Amer 33 (L) >60 mL/min   Anion gap 7 5 - 15  Brain natriuretic peptide  Result Value Ref Range   B Natriuretic Peptide 62.0 0.0 - 100.0 pg/mL  Troponin I  Result Value Ref Range   Troponin I <0.03 <0.03 ng/mL     EKG  EKG Interpretation  Date/Time:  Thursday April 22 2016  08:57:03 EST Ventricular Rate:  63 PR Interval:    QRS Duration: 145 QT Interval:  503 QTC Calculation: 515 R Axis:   148 Text Interpretation:  Right and left arm electrode reversal, interpretation assumes no reversal Sinus rhythm Probable lateral infarct, age indeterminate Anterior infarct, old Prolonged QT interval Confirmed by Deretha Emory  MD, Arieanna Pressey [phone]) on 04/22/2016 9:18:52 AM       Radiology Dg Chest 2 View  Result Date: 04/22/2016 CLINICAL DATA:  Shortness of breath EXAM: CHEST  2 VIEW COMPARISON:  04/18/2016 FINDINGS: AP and lateral views of the chest show hyperexpansion without focal airspace consolidation. No pulmonary edema or pleural effusion. Cardiopericardial silhouette is at upper limits of normal for size. The visualized bony structures of the thorax are intact. Telemetry leads overlie the chest. IMPRESSION: Hyperexpansion without acute findings. Electronically Signed   By: Kennith Center M.D.   On: 04/22/2016 10:54    Procedures Procedures  DIAGNOSTIC STUDIES: Oxygen Saturation is 100% on RA, normal by my interpretation.    COORDINATION OF CARE: 9:54 AM Discussed next steps with pt and her relative. They verbalized understanding and are agreeable with the plan.    Medications Ordered in ED Medications - No data to display   Initial Impression / Assessment and Plan / ED Course  I have reviewed the triage vital signs and the nursing notes.  Pertinent labs & imaging results that were available during my care of the patient were reviewed by me and considered in my medical decision making (see chart for details).  Clinical Course    Patient with feeling of shortness of breath several visits workup here today negative for that. Some evidence of some mild dehydration patient treated with some IV normal saline for this. Patient doing better. Discussed with family members. Patient clear for discharge home. Patient has a history of COPD but no wheezing here. Does have  inhalers at home. Patient without any hypoxia at all oxygen saturation is her in the upper 90s which is excellent for her age. Lungs are clear bilaterally.  Leukocytosis is somewhat unexplained. But no significant anemia.   I personally performed the services described in this documentation, which was scribed in my presence. The recorded information has been reviewed and is accurate.    Final Clinical Impressions(s) / ED Diagnoses   Final diagnoses:  None    New Prescriptions New Prescriptions   No medications on file     Vanetta Mulders, MD 04/22/16 1415

## 2016-04-26 ENCOUNTER — Telehealth: Payer: Self-pay | Admitting: Cardiology

## 2016-04-26 ENCOUNTER — Ambulatory Visit (INDEPENDENT_AMBULATORY_CARE_PROVIDER_SITE_OTHER): Payer: Medicare Other | Admitting: Nurse Practitioner

## 2016-04-26 ENCOUNTER — Telehealth: Payer: Self-pay | Admitting: Internal Medicine

## 2016-04-26 ENCOUNTER — Encounter: Payer: Self-pay | Admitting: Nurse Practitioner

## 2016-04-26 ENCOUNTER — Other Ambulatory Visit: Payer: Self-pay | Admitting: *Deleted

## 2016-04-26 VITALS — BP 175/67 | HR 65 | Temp 97.5°F | Ht 66.0 in | Wt 145.0 lb

## 2016-04-26 DIAGNOSIS — R1314 Dysphagia, pharyngoesophageal phase: Secondary | ICD-10-CM

## 2016-04-26 DIAGNOSIS — I10 Essential (primary) hypertension: Secondary | ICD-10-CM

## 2016-04-26 MED ORDER — FUROSEMIDE 20 MG PO TABS: 20.0000 mg | ORAL_TABLET | Freq: Every day | ORAL | 1 refills | Status: DC | PRN

## 2016-04-26 NOTE — Patient Instructions (Signed)
1. Contact her cardiologist and her lung doctor to get the okay to proceed with your procedure. 2. Keep your appointment with Dr. Juanetta GoslingHawkins 3. We will schedule your procedure for you. 4. Return for follow-up in 4 months.

## 2016-04-26 NOTE — Telephone Encounter (Signed)
Patient has questions about her medications. / tg

## 2016-04-26 NOTE — Progress Notes (Signed)
Referring Provider: Gareth MorganKnowlton, Steve, MD Primary Care Physician:  Milana ObeyStephen D [name], MD Primary GI:  Dr. Jena Gaussourk  Chief Complaint  Patient presents with  . Dysphagia    consult for EGD, food and meds    HPI:   [name] is a 82 y.o. female who presents for chief complaint of "consult for EGD." The patient was last seen in our office 03/17/2016 for constipation, dysphagia, GERD. Noted history of cervical web dilation but unsure if this helped. BPE had previously been ordered but not completed. At the time of her last visit stated she was doing "so-so" overall. Has been seen a few times recently for COPD exacerbation. Her symptoms well controlled at that point, no overt dysphagia symptoms but "feels like her to not and there." Has been following a soft food diet with some improvement, noted continued constipation and her daughter felt that she was likely not taking her laxatives regularly or giving non-laxative options a chance. Recommended daily Colace, can increase to twice a day if needed, add fiber supplement daily, take MiraLAX one to 2 times a day if no productive bowel movement in 2 or more days. She was encouraged to complete the barium pill esophagram to further evaluate her dysphagia Return for follow-up in 3 months.  BPE was completed on 03/24/2016 which found narrowing at the gastroesophageal junction obstructing a 12.5 mm diameter barium tablet, laryngeal penetration without aspiration, diffuse age-related esophageal dysmotility.  Today she states sh'es doing ok overall, having some ear problems. Is having some increased anxiety as of late which causes hypertension. Her BP is high today, carries Clonidine  When she saw her BP. She is continuing to have swallowing difficulties "feels like something there." Denies GERD symptoms, fever, chills. Is having some decline in weight due to decreased po intake because of "fear of swallowing" per her daughter. Objectively has lost about 10  lbs in 1 month. Denies palpitations, excessive bleeding or bruising. States her breathing is doing ok. Daughter states the majority of the issue is anxiety-related hyperventilation. Denies chest pain, dyspnea, dizziness, lightheadedness, syncope, near syncope. Denies any other upper or lower GI symptoms.  Past Medical History:  Diagnosis Date  . Anxiety   . Atrial fibrillation (HCC)   . COPD (chronic obstructive pulmonary disease) (HCC)   . Depression   . Hyperlipidemia   . Hypertension   . Hypothyroidism   . Mitral regurgitation    Mild  . Palpitations    PAT/EAT/NSVT, normal LVEF  . Type 2 diabetes mellitus (HCC)     Past Surgical History:  Procedure Laterality Date  . 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 hiatal hernia. Antral erosions status post gastric biopsy.  Marland Kitchen. MALONEY DILATION N/A 04/17/2014   Procedure: Elease HashimotoMALONEY DILATION;  Surgeon: Corbin Adeobert M Rourk, MD;  Location: [facility];  Service: Endoscopy;  Laterality: N/A;  . none      Current Outpatient Prescriptions  Medication Sig Dispense Refill  . Albuterol Sulfate (PROAIR RESPICLICK) 108 (90 Base) MCG/ACT AEPB Inhale 1-2 puffs into the lungs daily as needed (for shortness of breath).    . ALPRAZolam (XANAX) 0.25 MG tablet Take 0.25 mg by mouth daily as needed for anxiety or sleep.     Marland Kitchen. amiodarone (PACERONE) 200 MG tablet TAKE 1 TABLET BY MOUTH EVERY DAY 90 tablet 3  . atorvastatin (LIPITOR) 40 MG tablet Take 40 mg by mouth daily at 6  PM.    . budesonide-formoterol (SYMBICORT) 80-4.5 MCG/ACT inhaler Inhale 2 puffs into the lungs daily.    . cetirizine (ZYRTEC) 10 MG tablet Take 5-10 mg by mouth as needed for allergies or rhinitis.     . cloNIDine (CATAPRES) 0.1 MG tablet Take 1 tablet 0.1 mg daily in the evening. 30 tablet 3  . ELIQUIS 2.5 MG TABS tablet TAKE 1 TABLET BY MOUTH TWICE DAILY 60 tablet 6  . ferrous  sulfate 325 (65 FE) MG tablet Take 325 mg by mouth daily with breakfast.    . furosemide (LASIX) 20 MG tablet Take 20 mg by mouth daily as needed for fluid.     Marland Kitchen glimepiride (AMARYL) 1 MG tablet Take 1 mg by mouth daily before breakfast.     . guaiFENesin-dextromethorphan (ROBITUSSIN DM) 100-10 MG/5ML syrup Take 5 mLs by mouth every 4 (four) hours as needed for cough (SUGAR FREE).    Marland Kitchen levothyroxine (SYNTHROID, LEVOTHROID) 25 MCG tablet Take 25 mcg by mouth daily before breakfast.    . lisinopril (PRINIVIL,ZESTRIL) 20 MG tablet Take 1 tablet (20 mg total) by mouth 2 (two) times daily. 180 tablet 3  . metoprolol tartrate (LOPRESSOR) 25 MG tablet TAKE 1 TABLET BY MOUTH TWICE DAILY 60 tablet 6  . omeprazole (PRILOSEC) 20 MG capsule TAKE 1 CAPSULE BY MOUTH EVERY DAY 30 capsule 5  . predniSONE (STERAPRED UNI-PAK 21 TAB) 10 MG (21) TBPK tablet Take 1 tablet (10 mg total) by mouth daily. Take 6 tabs by mouth daily  for 2 days, then 5 tabs for 2 days, then 4 tabs for 2 days, then 3 tabs for 2 days, 2 tabs for 2 days, then 1 tab by mouth daily for 2 days 42 tablet 0  . spironolactone (ALDACTONE) 25 MG tablet Take 1 tablet (25 mg total) by mouth daily. 30 tablet 6   No current facility-administered medications for this visit.     Allergies as of 04/26/2016 - Review Complete 04/26/2016  Allergen Reaction Noted  . Chocolate  03/10/2014    Family History  Problem Relation Age of Onset  . Heart failure Mother     Died in her 21s  . Tuberculosis Father     Died at young age  . Cancer Brother   . Arthritis    . Colon cancer      Social History   Social History  . Marital status: Married    Spouse name: N/A  . Number of children: N/A  . Years of education: 75   Occupational History  . Retired   .  Retired   Social History Main Topics  . Smoking status: Never Smoker  . Smokeless tobacco: Never Used  . Alcohol use No  . Drug use: No  . Sexual activity: No   Other Topics Concern  .  None   Social History Narrative  . None    Review of Systems: General: Negative for anorexia, weight loss, fever, chills, fatigue, weakness. ENT: Negative for hoarseness. CV: Negative for chest pain, angina, palpitations, dyspnea on exertion, peripheral edema.  Respiratory: Negative for dyspnea at rest, dyspnea on exertion, cough, sputum, wheezing.  GI: See history of present illness. Endo: Negative for unusual weight change.  Heme: Negative for bruising or bleeding.   Physical Exam: BP (!) 175/67   Pulse 65   Temp 97.5 F (36.4 C) (Oral)   Ht 5\' 6"  (1.676 m)   Wt 145 lb (65.8 kg)   BMI 23.40 kg/m  General:  Alert and oriented. Pleasant and cooperative. Well-nourished and well-developed.  Eyes:  Without icterus, sclera clear and conjunctiva pink.  Ears:  Normal auditory acuity. Cardiovascular:  S1, S2 present without murmurs appreciated. Extremities without clubbing or edema. Respiratory:  Clear to auscultation bilaterally. No wheezes, rales, or rhonchi. No distress.  Gastrointestinal:  +BS, soft, non-tender and non-distended. No HSM noted. No guarding or rebound. No masses appreciated.  Rectal:  Deferred  Neurologic:  Alert and oriented x4;  grossly normal neurologically. Psych:  Alert and cooperative. Normal mood and affect. Heme/Lymph/Immune: No excessive bruising noted.    04/26/2016 10:42 AM   Disclaimer: This note was dictated with voice recognition software. Similar sounding words can inadvertently be transcribed and may not be corrected upon review.

## 2016-04-26 NOTE — Telephone Encounter (Signed)
noted 

## 2016-04-26 NOTE — Progress Notes (Signed)
CC'ED TO PCP 

## 2016-04-26 NOTE — Assessment & Plan Note (Signed)
The patient continues with dysphagia symptoms. Feels like "something is in my throat." Barium pill esophagram did show narrowing and the patient has a history of web status post dilation. Also age-related dysmotility on barium pill esophagram. She is following a soft diet currently. At this point they're wanting to pursue upper endoscopy with dilation. She will need to be cleared by cardiology and pulmonary as she was just recently seen in the emergency department for COPD exacerbation. Her lungs sound very good today, no wheezing. Her daughter, again, thinks that her breathing issues, like her hypertension, are due to anxiety. Return for follow-up in 4 months.  Proceed with EGD +/- dilation with Dr. Jena Gaussourk in near future: the risks, benefits, and alternatives have been discussed with the patient in detail. The patient states understanding and desires to proceed.  Patient is currently on Xanax, Eliquis. No other anticoagulants, anxiolytics, chronic pain medications, or antidepressants. She does take Aldactone for atrial fibrillation.   We will need clearance by cardiology and the okay to hold her anticoagulation prior to EGD. We will recheck the cardiology and pulmonary for clearance.

## 2016-04-26 NOTE — Telephone Encounter (Signed)
furosemide (LASIX) 20 MG tablet   [facility]

## 2016-04-26 NOTE — Telephone Encounter (Signed)
Pt called back to say that her daughter called Dr Sudie BaileyKnowlton and everything is ok.

## 2016-04-26 NOTE — Telephone Encounter (Signed)
Done

## 2016-04-26 NOTE — Telephone Encounter (Signed)
Pt was seen today and called this afternoon asking about her fluid pill. She wanted to know if she was able to take it because her ankles are swelling and didn't know if that was a medications that needed to be held. Please advise and call her at [phone]

## 2016-04-26 NOTE — Assessment & Plan Note (Signed)
Patient has a history of hypertension and today her blood pressure was 175/67. She has Klonopin 0.1 mg to take as needed. She took her Klonopin in our office. Her daughter feels her blood pressure issues are mostly anxiety. Continue to monitor, see cardiology as appropriate.

## 2016-05-05 ENCOUNTER — Telehealth: Payer: Self-pay | Admitting: Cardiology

## 2016-05-05 NOTE — Telephone Encounter (Signed)
Pt is having a procedure done on Wednesday 12/20, pt is needing to stop her Eliquis --Dr. Jena Gaussourk is doing the procedure. Please give her a call and let her know what she needs to do

## 2016-05-06 ENCOUNTER — Other Ambulatory Visit: Payer: Self-pay

## 2016-05-06 DIAGNOSIS — R1310 Dysphagia, unspecified: Secondary | ICD-10-CM

## 2016-05-06 NOTE — Telephone Encounter (Signed)
Will forward to cardiologist for advice

## 2016-05-06 NOTE — Telephone Encounter (Signed)
Chart indicates that she is scheduled for EGD with possible dilatation. Would hold Eliquis 48 hours prior to procedure.

## 2016-05-06 NOTE — Telephone Encounter (Signed)
I spoke with both pt and son Zenaida Niecemos, they understand to hold eliquis 48 hrs before EGD

## 2016-05-07 NOTE — Telephone Encounter (Signed)
Noted  

## 2016-05-12 ENCOUNTER — Ambulatory Visit (HOSPITAL_COMMUNITY)
Admission: RE | Admit: 2016-05-12 | Discharge: 2016-05-12 | Disposition: A | Discharge status: Home / Self Care | Payer: Medicare Other | Source: Ambulatory Visit | Attending: Internal Medicine | Admitting: Internal Medicine

## 2016-05-12 ENCOUNTER — Encounter (HOSPITAL_COMMUNITY)
Admission: RE | Disposition: A | Discharge status: Home / Self Care | Payer: Self-pay | Source: Ambulatory Visit | Attending: Internal Medicine

## 2016-05-12 ENCOUNTER — Encounter (HOSPITAL_COMMUNITY): Payer: Self-pay

## 2016-05-12 DIAGNOSIS — K449 Diaphragmatic hernia without obstruction or gangrene: Secondary | ICD-10-CM | POA: Insufficient documentation

## 2016-05-12 DIAGNOSIS — E119 Type 2 diabetes mellitus without complications: Secondary | ICD-10-CM | POA: Insufficient documentation

## 2016-05-12 DIAGNOSIS — J449 Chronic obstructive pulmonary disease, unspecified: Secondary | ICD-10-CM | POA: Insufficient documentation

## 2016-05-12 DIAGNOSIS — K222 Esophageal obstruction: Secondary | ICD-10-CM | POA: Insufficient documentation

## 2016-05-12 DIAGNOSIS — Z79899 Other long term (current) drug therapy: Secondary | ICD-10-CM | POA: Insufficient documentation

## 2016-05-12 DIAGNOSIS — I10 Essential (primary) hypertension: Secondary | ICD-10-CM | POA: Diagnosis not present

## 2016-05-12 DIAGNOSIS — F329 Major depressive disorder, single episode, unspecified: Secondary | ICD-10-CM | POA: Diagnosis not present

## 2016-05-12 DIAGNOSIS — E039 Hypothyroidism, unspecified: Secondary | ICD-10-CM | POA: Insufficient documentation

## 2016-05-12 DIAGNOSIS — F419 Anxiety disorder, unspecified: Secondary | ICD-10-CM | POA: Insufficient documentation

## 2016-05-12 DIAGNOSIS — I340 Nonrheumatic mitral (valve) insufficiency: Secondary | ICD-10-CM | POA: Diagnosis not present

## 2016-05-12 DIAGNOSIS — K219 Gastro-esophageal reflux disease without esophagitis: Secondary | ICD-10-CM | POA: Insufficient documentation

## 2016-05-12 DIAGNOSIS — E785 Hyperlipidemia, unspecified: Secondary | ICD-10-CM | POA: Insufficient documentation

## 2016-05-12 DIAGNOSIS — R1310 Dysphagia, unspecified: Secondary | ICD-10-CM | POA: Insufficient documentation

## 2016-05-12 HISTORY — PX: ESOPHAGOGASTRODUODENOSCOPY: SHX5428

## 2016-05-12 HISTORY — PX: MALONEY DILATION: SHX5535

## 2016-05-12 LAB — GLUCOSE, CAPILLARY: Glucose-Capillary: 92 mg/dL (ref 65–99)

## 2016-05-12 SURGERY — EGD (ESOPHAGOGASTRODUODENOSCOPY)
Anesthesia: Moderate Sedation

## 2016-05-12 MED ORDER — STERILE WATER FOR IRRIGATION IR SOLN
Status: DC | PRN
  Administered 2016-05-12: 08:00:00

## 2016-05-12 MED ORDER — LIDOCAINE VISCOUS 2 % MT SOLN
OROMUCOSAL | Status: AC
  Filled 2016-05-12: qty 15

## 2016-05-12 MED ORDER — MEPERIDINE HCL 100 MG/ML IJ SOLN
INTRAMUSCULAR | Status: AC
  Filled 2016-05-12: qty 2

## 2016-05-12 MED ORDER — MIDAZOLAM HCL 5 MG/5ML IJ SOLN
INTRAMUSCULAR | Status: DC | PRN
  Administered 2016-05-12: 1 mg via INTRAVENOUS
  Administered 2016-05-12: 2 mg via INTRAVENOUS
  Administered 2016-05-12: 1 mg via INTRAVENOUS

## 2016-05-12 MED ORDER — LIDOCAINE VISCOUS 2 % MT SOLN
OROMUCOSAL | Status: DC | PRN
  Administered 2016-05-12: 3 mL via OROMUCOSAL

## 2016-05-12 MED ORDER — SODIUM CHLORIDE 0.9 % IV SOLN
INTRAVENOUS | Status: DC
  Administered 2016-05-12: 07:00:00 via INTRAVENOUS

## 2016-05-12 MED ORDER — ONDANSETRON HCL 4 MG/2ML IJ SOLN
INTRAMUSCULAR | Status: DC | PRN
  Administered 2016-05-12: 4 mg via INTRAVENOUS

## 2016-05-12 MED ORDER — ONDANSETRON HCL 4 MG/2ML IJ SOLN
INTRAMUSCULAR | Status: AC
  Filled 2016-05-12: qty 2

## 2016-05-12 MED ORDER — MEPERIDINE HCL 100 MG/ML IJ SOLN
INTRAMUSCULAR | Status: DC | PRN
  Administered 2016-05-12 (×2): 25 mg via INTRAVENOUS

## 2016-05-12 MED ORDER — MIDAZOLAM HCL 5 MG/5ML IJ SOLN
INTRAMUSCULAR | Status: AC
  Filled 2016-05-12: qty 10

## 2016-05-12 NOTE — Discharge Instructions (Addendum)
Dysphagia Diet Level 2, Mechanically Altered The dysphagia level 2 diet includes foods that are blended, chopped, ground, or mashed so they are easier to chew and swallow. The foods are soft, moist, and can be chopped into -inch chunks (such as pancakes, pasta, and bananas). In order to be on this diet, you must be able to chew. This diet helps you transition between the pureed textures of the dysphagia level 1 diet to more solid textures. This diet is helpful for people with mild to moderate swallowing difficulties. It reduces the risk of food getting caught in the windpipe, trachea, or lungs. You may need help or supervision during meals while following this diet so that you eat safely. You will be on this diet until your health care provider advances the texture of your diet. What do I need to know about this diet? Foods  You may eat foods that are soft and moist.  You may need to use a blender, whisk, or masher to soften some of your foods.  You can moisten foods with gravies, sauces, vegetable or fruit juice, milk, half and half, or water when blending, mashing, or grinding your foods to the right consistency.  If you were on the dysphagia level 1 diet, you may still eat any of the foods included in that diet.  Avoid foods that are dry, hard, sticky, chewy, coarse, and crunchy. Also avoid large cuts of food.  Take small bites. Each bite should contain  inch or less of food. Liquids  Avoid liquids with seeds and chunks.  Thicken liquids, if instructed by your health care provider. Your health care provider will tell you the consistency to which you should thicken your liquids for safe swallowing. To thicken a liquid, use a commercial thickener or a thickening food (such as rice cereal or potato flakes). Ask your health care provider for specific recommendations on thickeners. See your dietitian or health care provider regularly for help with your dietary changes. What foods can I  eat? Grains  Store-bought soft breads that do not have nuts or seeds. Pancakes, sweet rolls, [place] pastries, and [place] toast that have been moistened with syrup or sauce to form a slurry when blended. Well-cooked pasta, noodles, and bread dressing. Well-cooked noodles and pasta in sauce. Moist macaroni and cheese. Soft dumplings or spaetzle with gravy or butter. Cooked cereals (including oatmeal). Low-texture dry cereals, such as rice puff, corn, or wheat-flake cereals, with milk (if thin liquids are not allowed, make sure all of the milk is absorbed by the cereal before eating it). Vegetables  Very soft, well-cooked vegetables in pieces less than  inch in size. Cooked potatoes that are moist, not crispy, and with sauce. Fruits  Canned or cooked fruits that are soft or moist and do not have skin or seeds. Fresh, soft bananas. Fruit juices with a small amount of pulp (if thin liquids are allowed). Gelatin or plain gelatin with canned fruit, except pineapple. Meat and Other Protein Sources  Tender, moist meats, poultry, or fish cooked with gravy or sauce and cubed to -inch bites or smaller. Ground meat. Moist meatball or meatloaf. Fish without bones. Moist casseroles without rice. Tuna, egg, or meat salad without chunks or hard-to-chew vegetables, such as celery and onions. Smooth quiche without large chunks. Scrambled, poached, or soft-cooked eggs with butter, margarine, sauce, or gravy. Tofu. Well-cooked, moistened and mashed beans, peas, baked beans, and other legumes. Casseroles without rice (such as tuna noodle casserole or soft moist meat lasagna). Dairy  Cream cheese. Yogurt. Cottage cheese. Ask your health care provider if milk is allowed. Sweets/Desserts  Pudding. Custard. Soft fruit pies with crust on the bottom only. Crisps and cobblers without seeds or nuts and with soft crusts. Soft, moist cakes. Icing. Pre-gelled cookies. Soft, moist cookies dunked in milk, coffee, or another liquid.  Jelly. Soft, smooth chocolate bars that are easily chewed. Jams and preserves without seeds. Ask your health care provider whether you can have frozen desserts. Fats and Oils  Butter. Margarine. Cream for cereal, depending on liquid consistency allowed. Gravy. Cream sauces. Mayonnaise. Salad dressings. Cream cheese. Cheese spreads, plain or with soft fruits or vegetables added. Sour cream. Sour cream dips with soft fruits or vegetables added. Whipped toppings. Other  Sauces and salsas that have soft chunks that are about  inch or smaller. The items listed above may not be a complete list of recommended foods or beverages. Contact your dietitian for more options.  What foods are not recommended? Grains  All breads not listed in the recommended list. Breads that are hard or have nuts or seeds. Coarse cereals. Cereals that have nuts, seeds, dried fruits, or coconut. Rice. Corn. Vegetables  Whole, raw, frozen, or dried vegetables. Tough, fibrous, chewy, or stringy cooked vegetables, such as celery, peas, broccoli, cabbage, Brussels sprouts, and asparagus. Potato skins. Potato and other vegetable chips. Fried or French-fried potatoes. Cooked corn and peas. Fruits  Whole raw, frozen, or dried fruits, including coconut. Pineapple. Fruits with seeds. Meat and Other Protein Sources  Dry, tough meats, such as bacon, sausage, and hot dogs. Cheese slices and cubes. Peanut butter. Hard boiled or fried eggs. Nuts. Seeds. Pizza. Sandwiches. Dry casseroles or casseroles with rice or large chunks. Dairy  Yogurt with nuts, seeds, or large chunks. Sweets/Desserts  Coarse, hard, chewy, or sticky desserts. Any dessert with nuts, seeds, coconut, pineapple, or dried fruit. Ask your health care provider whether you can have frozen desserts. Fats and Oils  Avoid fats with chunky, large textures, such as those with nuts or fruits. Other  Soups and casseroles with large chunks. The items listed above may not be a  complete list of foods and beverages to avoid. Contact your dietitian for more information.  This information is not intended to replace advice given to you by your health care provider. Make sure you discuss any questions you have with your health care provider. Document Released: 05/10/2005 Document Revised: 10/16/2015 Document Reviewed: 04/23/2013 Elsevier Interactive Patient Education  2017 [employer] Dysphagia Diet Level 1, Pureed The dysphasia level 1 diet includes foods that are completely pureed and smooth. The foods have a pudding-like texture, such as the texture of pureed pancakes, mashed potatoes, and yogurt. The diet does not include foods with lumps or coarse textures. Liquids should be smooth and may either be thin, nectar-thick, honey-like, or spoon-thick. This diet is helpful for people with moderate to severe swallowing problems. It reduces the risk of food getting caught in the windpipe, trachea, or lungs. You may need help or supervision during meals while following this diet. What do I need to know about this diet? Foods  You may eat foods that are soft and have a pudding-like texture. If a food does not have this texture, you may be able to eat the food after:  Pureeing it. This can be done with a blender or whisk.  Moistening it with liquid. For example, you may have bread if you soak it in milk or syrup.  Avoid foods that are  hard, dry, sticky, chunky, lumpy, or stringy. Also avoid foods with nuts, seeds, raisins, skins, and pulp.  Do not eat foods that you have to chew. If you have to chew the food, then you cannot eat it.  Eat a variety of foods to get all the nutrients you need. Liquids  You may drink liquids that are smooth. Your health care provider will tell you if you should drink thin or thickened liquids.  To thicken a liquid, use a food and beverage thickener or a thickening food. Thickened liquids are usually a pudding-like consistency.  Thin  liquids include fruit juices, milk, coffee, tea, yogurts, shakes, and similar foods that melt to thin liquid at room temperature.  Avoid liquids with seeds, pulp, or chunks. See your dietitian or health care provider regularly for help with your dietary changes. What foods can I eat? Grains  Store-bought soft breads, pancakes, and [place] toast that have a smooth, moist texture and do not have nuts or seeds (you will need to moisten the food with liquid). Cooked cereals that have a pudding-like consistency, such as cream of wheat or farina (no oatmeal). Pureed, well-cooked pasta, rice, and plain bread stuffing. Vegetables  Pureed vegetables. Soft avocado. Smooth tomato paste or sauce. Strained or pureed soups (these may need to be thickened as directed). Mashed or pureed potatoes without skin (can be seasoned with butter, smooth gravy, margarine, or sour cream). Fruits  Pureed fruits such as melons and apples without seeds or pulp. Mashed bananas. Smooth tomato paste or sauce. Fruit juices without pulp or seeds. Strained or pureed soups. Meat and Other Protein Sources  Pureed meat. Smooth pate or liverwurst. Smooth souffles. Pureed beans (such as lentils). Pureed eggs. Dairy  Yogurt. Smooth cheese sauces. Milk (may need to be thickened). Nutritional dairy drinks or shakes. Ask your health care provider whether you can have ice cream. Condiments  Finely ground salt, pepper, and other ground spices. Sweets/Desserts  Smooth puddings and custards. Pureed desserts. Souffles. Whipped topping. Ask your health care provider whether you can have frozen desserts. Fats and Oils  Butter. Margarine. Smooth and strained gravy. Sour cream. Mayonnaise. Cream cheese. Whipped topping. Smooth sauces (such as white sauce, cheese sauce, or hollandaise sauce). The items listed above may not be a complete list of recommended foods or beverages. Contact your dietitian for more options.  What foods are not  recommended? Grains  Oatmeal. Dry cereals. Hard breads. Vegetables  Whole vegetables. Stringy vegetables (such as celery). Thin tomato sauce. Fruits  Whole fresh, frozen, canned, or dried fruits that have not been pureed. Stringy fruits (such as pineapple). Meat and Other Protein Sources  Whole or ground meat, fish, or poultry. Dried or cooked lentils or legumes that have been cooked but not mashed or pureed. Non-pureed eggs. Nuts and seeds. Peanut butter. Dairy  Non-pureed cheese. Dairy products with lumps or chunks. Ask your health care provider whether you can have ice cream. Condiments  Coarse or seeded herbs and spices. Sweets/Desserts  [place] preserves. Jams with seeds. Solid desserts. Sticky, chewy sweets (such as licorice and caramel). Ask your health care provider whether you can have frozen desserts. Fats and Oils  Sauces of fats with lumps or chunks. The items listed above may not be a complete list of foods and beverages to avoid. Contact your dietitian for more information.  This information is not intended to replace advice given to you by your health care provider. Make sure you discuss any questions you have with your  health care provider. Document Released: 05/10/2005 Document Revised: 10/16/2015 Document Reviewed: 04/23/2013 Elsevier Interactive Patient Education  2017 [employer] Resume Eliquis tomorrow  Continue omeprazole 20 mg daily  Dysphagia 1 diet today; advanced to dysphagia 2/3 tomorrow.  Swallowing precautions reviewed with patient's daughters.  Office visit with us in 3 months. March  20 at 10:00am with Wynne DustEric Gill  GERD information provided  EGD Discharge instructions Please read the instructions outlined below and refer to this sheet in the next few weeks. These discharge instructions provide you with general information on caring for yourself after you leave the hospital. Your doctor may also give you specific instructions. While your treatment has  been planned according to the most current medical practices available, unavoidable complications occasionally occur. If you have any problems or questions after discharge, please call your doctor. ACTIVITY  You may resume your regular activity but move at a slower pace for the next 24 hours.   Take frequent rest periods for the next 24 hours.   Walking will help expel (get rid of) the air and reduce the bloated feeling in your abdomen.   No driving for 24 hours (because of the anesthesia (medicine) used during the test).   You may shower.   Do not sign any important legal documents or operate any machinery for 24 hours (because of the anesthesia used during the test).  NUTRITION  Drink plenty of fluids.   You may resume your normal diet.   Begin with a light meal and progress to your normal diet.   Avoid alcoholic beverages for 24 hours or as instructed by your caregiver.  MEDICATIONS  You may resume your normal medications unless your caregiver tells you otherwise.  WHAT YOU CAN EXPECT TODAY  You may experience abdominal discomfort such as a feeling of fullness or gas pains.  FOLLOW-UP  Your doctor will discuss the results of your test with you.  SEEK IMMEDIATE MEDICAL ATTENTION IF ANY OF THE FOLLOWING OCCUR:  Excessive nausea (feeling sick to your stomach) and/or vomiting.   Severe abdominal pain and distention (swelling).   Trouble swallowing.   Temperature over 101 F (37.8 C).   Rectal bleeding or vomiting of blood.    Gastroesophageal Reflux Disease, Adult Normally, food travels down the esophagus and stays in the stomach to be digested. However, when a person has gastroesophageal reflux disease (GERD), food and stomach acid move back up into the esophagus. When this happens, the esophagus becomes sore and inflamed. Over time, GERD can create small holes (ulcers) in the lining of the esophagus. What are the causes? This condition is caused by a problem with  the muscle between the esophagus and the stomach (lower esophageal sphincter, or LES). Normally, the LES muscle closes after food passes through the esophagus to the stomach. When the LES is weakened or abnormal, it does not close properly, and that allows food and stomach acid to go back up into the esophagus. The LES can be weakened by certain dietary substances, medicines, and medical conditions, including:  Tobacco use.  Pregnancy.  Having a hiatal hernia.  Heavy alcohol use.  Certain foods and beverages, such as coffee, chocolate, onions, and peppermint. What increases the risk? This condition is more likely to develop in:  People who have an increased body weight.  People who have connective tissue disorders.  People who use NSAID medicines. What are the signs or symptoms? Symptoms of this condition include:  Heartburn.  Difficult or painful swallowing.  The  feeling of having a lump in the throat.  Abitter taste in the mouth.  Bad breath.  Having a large amount of saliva.  Having an upset or bloated stomach.  Belching.  Chest pain.  Shortness of breath or wheezing.  Ongoing (chronic) cough or a night-time cough.  Wearing away of tooth enamel.  Weight loss. Different conditions can cause chest pain. Make sure to see your health care provider if you experience chest pain. How is this diagnosed? Your health care provider will take a medical history and perform a physical exam. To determine if you have mild or severe GERD, your health care provider may also monitor how you respond to treatment. You may also have other tests, including:  An endoscopy toexamine your stomach and esophagus with a small camera.  A test thatmeasures the acidity level in your esophagus.  A test thatmeasures how much pressure is on your esophagus.  A barium swallow or modified barium swallow to show the shape, size, and functioning of your esophagus. How is this treated? The  goal of treatment is to help relieve your symptoms and to prevent complications. Treatment for this condition may vary depending on how severe your symptoms are. Your health care provider may recommend:  Changes to your diet.  Medicine.  Surgery. Follow these instructions at home: Diet  Follow a diet as recommended by your health care provider. This may involve avoiding foods and drinks such as:  Coffee and tea (with or without caffeine).  Drinks that containalcohol.  Energy drinks and sports drinks.  Carbonated drinks or sodas.  Chocolate and cocoa.  Peppermint and mint flavorings.  Garlic and onions.  Horseradish.  Spicy and acidic foods, including peppers, chili powder, curry powder, vinegar, hot sauces, and barbecue sauce.  Citrus fruit juices and citrus fruits, such as oranges, lemons, and limes.  Tomato-based foods, such as red sauce, chili, salsa, and pizza with red sauce.  Fried and fatty foods, such as donuts, french fries, potato chips, and high-fat dressings.  High-fat meats, such as hot dogs and fatty cuts of red and white meats, such as rib eye steak, sausage, ham, and bacon.  High-fat dairy items, such as whole milk, butter, and cream cheese.  Eat small, frequent meals instead of large meals.  Avoid drinking large amounts of liquid with your meals.  Avoid eating meals during the 2-3 hours before bedtime.  Avoid lying down right after you eat.  Do not exercise right after you eat. General instructions  Pay attention to any changes in your symptoms.  Take over-the-counter and prescription medicines only as told by your health care provider. Do not take aspirin, ibuprofen, or other NSAIDs unless your health care provider told you to do so.  Do not use any tobacco products, including cigarettes, chewing tobacco, and e-cigarettes. If you need help quitting, ask your health care provider.  Wear loose-fitting clothing. Do not wear anything tight around  your waist that causes pressure on your abdomen.  Raise (elevate) the head of your bed 6 inches (15cm).  Try to reduce your stress, such as with yoga or meditation. If you need help reducing stress, ask your health care provider.  If you are overweight, reduce your weight to an amount that is healthy for you. Ask your health care provider for guidance about a safe weight loss goal.  Keep all follow-up visits as told by your health care provider. This is important. Contact a health care provider if:  You have new symptoms.  You have unexplained weight loss.  You have difficulty swallowing, or it hurts to swallow.  You have wheezing or a persistent cough.  Your symptoms do not improve with treatment.  You have a hoarse voice. Get help right away if:  You have pain in your arms, neck, jaw, teeth, or back.  You feel sweaty, dizzy, or light-headed.  You have chest pain or shortness of breath.  You vomit and your vomit looks like blood or coffee grounds.  You faint.  Your stool is bloody or black.  You cannot swallow, drink, or eat. This information is not intended to replace advice given to you by your health care provider. Make sure you discuss any questions you have with your health care provider. Document Released: 02/17/2005 Document Revised: 10/08/2015 Document Reviewed: 09/04/2014 Elsevier Interactive Patient Education  2017 [employer].

## 2016-05-12 NOTE — H&P (View-Only) (Signed)
Referring Provider: Gareth MorganKnowlton, Steve, MD Primary Care Physician:  Milana ObeyStephen D [name], MD Primary GI:  Dr. Jena Gaussourk  Chief Complaint  Patient presents with  . Dysphagia    consult for EGD, food and meds    HPI:   [name] is a 82 y.o. female who presents for chief complaint of "consult for EGD." The patient was last seen in our office 03/17/2016 for constipation, dysphagia, GERD. Noted history of cervical web dilation but unsure if this helped. BPE had previously been ordered but not completed. At the time of her last visit stated she was doing "so-so" overall. Has been seen a few times recently for COPD exacerbation. Her symptoms well controlled at that point, no overt dysphagia symptoms but "feels like her to not and there." Has been following a soft food diet with some improvement, noted continued constipation and her daughter felt that she was likely not taking her laxatives regularly or giving non-laxative options a chance. Recommended daily Colace, can increase to twice a day if needed, add fiber supplement daily, take MiraLAX one to 2 times a day if no productive bowel movement in 2 or more days. She was encouraged to complete the barium pill esophagram to further evaluate her dysphagia Return for follow-up in 3 months.  BPE was completed on 03/24/2016 which found narrowing at the gastroesophageal junction obstructing a 12.5 mm diameter barium tablet, laryngeal penetration without aspiration, diffuse age-related esophageal dysmotility.  Today she states sh'es doing ok overall, having some ear problems. Is having some increased anxiety as of late which causes hypertension. Her BP is high today, carries Clonidine  When she saw her BP. She is continuing to have swallowing difficulties "feels like something there." Denies GERD symptoms, fever, chills. Is having some decline in weight due to decreased po intake because of "fear of swallowing" per her daughter. Objectively has lost about 10  lbs in 1 month. Denies palpitations, excessive bleeding or bruising. States her breathing is doing ok. Daughter states the majority of the issue is anxiety-related hyperventilation. Denies chest pain, dyspnea, dizziness, lightheadedness, syncope, near syncope. Denies any other upper or lower GI symptoms.  Past Medical History:  Diagnosis Date  . Anxiety   . Atrial fibrillation (HCC)   . COPD (chronic obstructive pulmonary disease) (HCC)   . Depression   . Hyperlipidemia   . Hypertension   . Hypothyroidism   . Mitral regurgitation    Mild  . Palpitations    PAT/EAT/NSVT, normal LVEF  . Type 2 diabetes mellitus (HCC)     Past Surgical History:  Procedure Laterality Date  . 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 hiatal hernia. Antral erosions status post gastric biopsy.  Marland Kitchen. MALONEY DILATION N/A 04/17/2014   Procedure: Elease HashimotoMALONEY DILATION;  Surgeon: Corbin Adeobert M Rourk, MD;  Location: [facility];  Service: Endoscopy;  Laterality: N/A;  . none      Current Outpatient Prescriptions  Medication Sig Dispense Refill  . Albuterol Sulfate (PROAIR RESPICLICK) 108 (90 Base) MCG/ACT AEPB Inhale 1-2 puffs into the lungs daily as needed (for shortness of breath).    . ALPRAZolam (XANAX) 0.25 MG tablet Take 0.25 mg by mouth daily as needed for anxiety or sleep.     Marland Kitchen. amiodarone (PACERONE) 200 MG tablet TAKE 1 TABLET BY MOUTH EVERY DAY 90 tablet 3  . atorvastatin (LIPITOR) 40 MG tablet Take 40 mg by mouth daily at 6  PM.    . budesonide-formoterol (SYMBICORT) 80-4.5 MCG/ACT inhaler Inhale 2 puffs into the lungs daily.    . cetirizine (ZYRTEC) 10 MG tablet Take 5-10 mg by mouth as needed for allergies or rhinitis.     . cloNIDine (CATAPRES) 0.1 MG tablet Take 1 tablet 0.1 mg daily in the evening. 30 tablet 3  . ELIQUIS 2.5 MG TABS tablet TAKE 1 TABLET BY MOUTH TWICE DAILY 60 tablet 6  . ferrous  sulfate 325 (65 FE) MG tablet Take 325 mg by mouth daily with breakfast.    . furosemide (LASIX) 20 MG tablet Take 20 mg by mouth daily as needed for fluid.     Marland Kitchen glimepiride (AMARYL) 1 MG tablet Take 1 mg by mouth daily before breakfast.     . guaiFENesin-dextromethorphan (ROBITUSSIN DM) 100-10 MG/5ML syrup Take 5 mLs by mouth every 4 (four) hours as needed for cough (SUGAR FREE).    Marland Kitchen levothyroxine (SYNTHROID, LEVOTHROID) 25 MCG tablet Take 25 mcg by mouth daily before breakfast.    . lisinopril (PRINIVIL,ZESTRIL) 20 MG tablet Take 1 tablet (20 mg total) by mouth 2 (two) times daily. 180 tablet 3  . metoprolol tartrate (LOPRESSOR) 25 MG tablet TAKE 1 TABLET BY MOUTH TWICE DAILY 60 tablet 6  . omeprazole (PRILOSEC) 20 MG capsule TAKE 1 CAPSULE BY MOUTH EVERY DAY 30 capsule 5  . predniSONE (STERAPRED UNI-PAK 21 TAB) 10 MG (21) TBPK tablet Take 1 tablet (10 mg total) by mouth daily. Take 6 tabs by mouth daily  for 2 days, then 5 tabs for 2 days, then 4 tabs for 2 days, then 3 tabs for 2 days, 2 tabs for 2 days, then 1 tab by mouth daily for 2 days 42 tablet 0  . spironolactone (ALDACTONE) 25 MG tablet Take 1 tablet (25 mg total) by mouth daily. 30 tablet 6   No current facility-administered medications for this visit.     Allergies as of 04/26/2016 - Review Complete 04/26/2016  Allergen Reaction Noted  . Chocolate  03/10/2014    Family History  Problem Relation Age of Onset  . Heart failure Mother     Died in her 21s  . Tuberculosis Father     Died at young age  . Cancer Brother   . Arthritis    . Colon cancer      Social History   Social History  . Marital status: Married    Spouse name: N/A  . Number of children: N/A  . Years of education: 75   Occupational History  . Retired   .  Retired   Social History Main Topics  . Smoking status: Never Smoker  . Smokeless tobacco: Never Used  . Alcohol use No  . Drug use: No  . Sexual activity: No   Other Topics Concern  .  None   Social History Narrative  . None    Review of Systems: General: Negative for anorexia, weight loss, fever, chills, fatigue, weakness. ENT: Negative for hoarseness. CV: Negative for chest pain, angina, palpitations, dyspnea on exertion, peripheral edema.  Respiratory: Negative for dyspnea at rest, dyspnea on exertion, cough, sputum, wheezing.  GI: See history of present illness. Endo: Negative for unusual weight change.  Heme: Negative for bruising or bleeding.   Physical Exam: BP (!) 175/67   Pulse 65   Temp 97.5 F (36.4 C) (Oral)   Ht 5\' 6"  (1.676 m)   Wt 145 lb (65.8 kg)   BMI 23.40 kg/m  General:  Alert and oriented. Pleasant and cooperative. Well-nourished and well-developed.  Eyes:  Without icterus, sclera clear and conjunctiva pink.  Ears:  Normal auditory acuity. Cardiovascular:  S1, S2 present without murmurs appreciated. Extremities without clubbing or edema. Respiratory:  Clear to auscultation bilaterally. No wheezes, rales, or rhonchi. No distress.  Gastrointestinal:  +BS, soft, non-tender and non-distended. No HSM noted. No guarding or rebound. No masses appreciated.  Rectal:  Deferred  Neurologic:  Alert and oriented x4;  grossly normal neurologically. Psych:  Alert and cooperative. Normal mood and affect. Heme/Lymph/Immune: No excessive bruising noted.    04/26/2016 10:42 AM   Disclaimer: This note was dictated with voice recognition software. Similar sounding words can inadvertently be transcribed and may not be corrected upon review.

## 2016-05-12 NOTE — Op Note (Signed)
[facility] Patient Name: [name] Procedure Date: 05/12/2016 7:08 AM MRN: [id number] Date of Birth: March 08, 1927 Attending MD: Gennette Pacobert Michael Noa Galvao , MD CSN: [id number] Age: 82 Admit Type: Outpatient Procedure:                Upper GI endoscopy with maloney dilation Indications:              Dysphagia Providers:                Gennette Pacobert Michael Denorris Reust, MD, Criselda PeachesLurae B. Patsy LagerAlbert RN, RN,                            Burke Keelsrisann Tilley, Technician, Dyann Ruddleonya Wilson Referring MD:              Medicines:                Midazolam 4 mg IV, Meperidine 50 mg IV, Ondansetron                            4 mg IV Complications:            No immediate complications. Estimated Blood Loss:     Estimated blood loss was minimal. Procedure:                Pre-Anesthesia Assessment:                           - Prior to the procedure, a History and Physical                            was performed, and patient medications and                            allergies were reviewed. The patient's tolerance of                            previous anesthesia was also reviewed. The risks                            and benefits of the procedure and the sedation                            options and risks were discussed with the patient.                            All questions were answered, and informed consent                            was obtained. ASA Grade Assessment: III - A patient                            with severe systemic disease. After reviewing the                            risks and benefits, the patient was deemed in  satisfactory condition to undergo the procedure.                           After obtaining informed consent, the endoscope was                            passed under direct vision. Throughout the                            procedure, the patient's blood pressure, pulse, and                            oxygen saturations were monitored continuously. The                 EG-[id number] ([id number]) scope was introduced through the                            mouth, and advanced to the second part of duodenum.                            The upper GI endoscopy was accomplished without                            difficulty. The patient tolerated the procedure                            well. The patient tolerated the procedure well. The                            patient tolerated the procedure well. Scope In: 7:52:48 AM Scope Out: 7:59:52 AM Total Procedure Duration: 0 hours 7 minutes 4 seconds  Findings:      A moderate Schatzki ring (acquired) was found at the gastroesophageal       junction.      A medium-sized hiatal hernia was present.      The cardia and gastric fundus were normal on retroflexion.      The duodenal bulb and second portion of the duodenum were normal. The       scope was withdrawn. Dilation was performed with a Maloney dilator with       no resistance at 52 Fr. The scope was withdrawn. Dilation was performed       with a Maloney dilator with no resistance at 54 Fr. The scope was       withdrawn. Dilation was performed with a Maloney dilator with mild       resistance at 56 Fr. The dilation site was examined following endoscope       reinsertion and showed moderate improvement in luminal narrowing.       Estimated blood loss was minimal. Impression:               - Moderate Schatzki ring. Dilated.                           - Medium-sized hiatal hernia.                           -  Normal duodenal bulb and second portion of the                            duodenum.                           - No specimens collected. Moderate Sedation:      Moderate (conscious) sedation was administered by the endoscopy nurse       and supervised by the endoscopist. The following parameters were       monitored: oxygen saturation, heart rate, blood pressure, respiratory       rate, EKG, adequacy of pulmonary ventilation, and response to care.        Total physician intraservice time was 15 minutes. Recommendation:           - Patient has a contact number available for                            emergencies. The signs and symptoms of potential                            delayed complications were discussed with the                            patient. Return to normal activities tomorrow.                            Written discharge instructions were provided to the                            patient.                           - Resume previous diet.                           - Continue present medications.                           - Resume Eliquis (apixaban) at prior dose tomorrow.                            Continue omeprazole 20 mg daily.                           - No repeat upper endoscopy.                           - Return to GI office in 3 months. Procedure Code(s):        --- Professional ---                           [phone], Esophagogastroduodenoscopy, flexible,                            transoral; diagnostic, including collection of  specimen(s) by brushing or washing, when performed                            (separate procedure)                           43450, Dilation of esophagus, by unguided sound or                            bougie, single or multiple passes                           99152, Moderate sedation services provided by the                            same physician or other qualified health care                            professional performing the diagnostic or                            therapeutic service that the sedation supports,                            requiring the presence of an independent trained                            observer to assist in the monitoring of the                            patient's level of consciousness and physiological                            status; initial 15 minutes of intraservice time,                            patient age 37 years or  older Diagnosis Code(s):        --- Professional ---                           K22.2, Esophageal obstruction                           K44.9, Diaphragmatic hernia without obstruction or                            gangrene                           R13.10, Dysphagia, unspecified CPT copyright 2016 [employer]. All rights reserved. The codes documented in this report are preliminary and upon coder review may  be revised to meet current compliance requirements. Gerrit Friendsobert M. Nyxon Strupp, MD Gennette Pacobert Michael Briony Parveen, MD 05/12/2016 8:14:18 AM This report has been signed electronically. Number of Addenda: 0

## 2016-05-12 NOTE — Interval H&P Note (Signed)
History and Physical Interval Note:  05/12/2016 7:40 AM  [name]  has presented today for surgery, with the diagnosis of dysphagia  The various methods of treatment have been discussed with the patient and family. After consideration of risks, benefits and other options for treatment, the patient has consented to  Procedure(s) with comments: ESOPHAGOGASTRODUODENOSCOPY (EGD) (N/A) - 730  MALONEY DILATION (N/A) as a surgical intervention .  The patient's history has been reviewed, patient examined, no change in status, stable for surgery.  I have reviewed the patient's chart and labs.  Questions were answered to the patient's satisfaction.     [name]  No change. Patient reportedly not on any anticoagulation at this time. EGD/ED per plan.  The risks, benefits, limitations, alternatives and imponderables have been reviewed with the patient. Potential for esophageal dilation, biopsy, etc. have also been reviewed.  Questions have been answered. All parties agreeable.

## 2016-05-15 ENCOUNTER — Encounter (HOSPITAL_COMMUNITY): Payer: Self-pay | Admitting: Emergency Medicine

## 2016-05-15 ENCOUNTER — Emergency Department (HOSPITAL_COMMUNITY)
Admission: EM | Admit: 2016-05-15 | Discharge: 2016-05-15 | Disposition: A | Discharge status: Home / Self Care | Payer: Medicare Other | Attending: Emergency Medicine | Admitting: Emergency Medicine

## 2016-05-15 DIAGNOSIS — I110 Hypertensive heart disease with heart failure: Secondary | ICD-10-CM | POA: Insufficient documentation

## 2016-05-15 DIAGNOSIS — I10 Essential (primary) hypertension: Secondary | ICD-10-CM

## 2016-05-15 DIAGNOSIS — J449 Chronic obstructive pulmonary disease, unspecified: Secondary | ICD-10-CM | POA: Insufficient documentation

## 2016-05-15 DIAGNOSIS — Z79899 Other long term (current) drug therapy: Secondary | ICD-10-CM | POA: Diagnosis not present

## 2016-05-15 DIAGNOSIS — I5032 Chronic diastolic (congestive) heart failure: Secondary | ICD-10-CM | POA: Insufficient documentation

## 2016-05-15 DIAGNOSIS — Z7984 Long term (current) use of oral hypoglycemic drugs: Secondary | ICD-10-CM | POA: Insufficient documentation

## 2016-05-15 DIAGNOSIS — E876 Hypokalemia: Secondary | ICD-10-CM | POA: Diagnosis not present

## 2016-05-15 DIAGNOSIS — Z7982 Long term (current) use of aspirin: Secondary | ICD-10-CM | POA: Insufficient documentation

## 2016-05-15 DIAGNOSIS — E039 Hypothyroidism, unspecified: Secondary | ICD-10-CM | POA: Insufficient documentation

## 2016-05-15 DIAGNOSIS — E119 Type 2 diabetes mellitus without complications: Secondary | ICD-10-CM | POA: Insufficient documentation

## 2016-05-15 DIAGNOSIS — R42 Dizziness and giddiness: Secondary | ICD-10-CM | POA: Diagnosis present

## 2016-05-15 LAB — I-STAT CHEM 8, ED
BUN: 7 mg/dL (ref 6–20)
CHLORIDE: 98 mmol/L — AB (ref 101–111)
CREATININE: 0.9 mg/dL (ref 0.44–1.00)
Calcium, Ion: 1.1 mmol/L — ABNORMAL LOW (ref 1.15–1.40)
Glucose, Bld: 79 mg/dL (ref 65–99)
HEMATOCRIT: 32 % — AB (ref 36.0–46.0)
Hemoglobin: 10.9 g/dL — ABNORMAL LOW (ref 12.0–15.0)
POTASSIUM: 3.2 mmol/L — AB (ref 3.5–5.1)
SODIUM: 138 mmol/L (ref 135–145)
TCO2: 25 mmol/L (ref 0–100)

## 2016-05-15 MED ORDER — POTASSIUM CHLORIDE CRYS ER 10 MEQ PO TBCR: 20.0000 meq | EXTENDED_RELEASE_TABLET | Freq: Every day | ORAL | 0 refills | Status: DC

## 2016-05-15 MED ORDER — POTASSIUM CHLORIDE CRYS ER 20 MEQ PO TBCR
40.0000 meq | EXTENDED_RELEASE_TABLET | Freq: Once | ORAL | Status: AC
  Administered 2016-05-15: 40 meq via ORAL
  Filled 2016-05-15: qty 2

## 2016-05-15 NOTE — Discharge Instructions (Signed)
Follow-up with your family doctor in 1-2 weeks

## 2016-05-15 NOTE — ED Provider Notes (Signed)
[facility]-EMERGENCY DEPT Provider Note   CSN: [id number] Arrival date & time: 05/15/16  1425     History   Chief Complaint Chief Complaint  Patient presents with  . Hypertension    HPI [name] is a 82 y.o. female.  Patient states that her blood pressure was elevated today and she felt little dizzy. Patient now states she's no longer dizzy   The history is provided by the patient. No language interpreter was used.  Hypertension  This is a chronic problem. The current episode started 3 to 5 hours ago. The problem occurs rarely. The problem has been resolved. Pertinent negatives include no chest pain, no abdominal pain and no headaches. Nothing aggravates the symptoms. Nothing relieves the symptoms. She has tried nothing for the symptoms.    Past Medical History:  Diagnosis Date  . Anxiety   . Atrial fibrillation (HCC)   . COPD (chronic obstructive pulmonary disease) (HCC)   . Depression   . Hyperlipidemia   . Hypertension   . Hypothyroidism   . Mitral regurgitation    Mild  . Palpitations    PAT/EAT/NSVT, normal LVEF  . Type 2 diabetes mellitus [facility](HCC)     Patient Active Problem List   Diagnosis Date Noted  . GERD (gastroesophageal reflux disease) 03/17/2016  . Constipation 10/29/2015  . Gastric erosions 02/03/2015  . Sinus bradycardia 09/26/2014  . Dyspnea on exertion 09/18/2014  . Chronic diastolic heart failure (HCC) 09/18/2014  . Dizziness 09/18/2014  . Atrial flutter with rapid ventricular response (HCC)   . Atrial fibrillation with RVR (HCC) 09/09/2014  . Atrial flutter (HCC) 07/13/2014  . Hyponatremia 07/13/2014  . Diabetes (HCC) 07/13/2014  . Hypothyroidism 07/13/2014  . Tachycardia 07/13/2014  . Dysphagia, pharyngoesophageal phase 04/16/2014  . Neurogenic pain of lower extremity 03/15/2012  . Left bundle branch block 03/02/2011  . RENAL INSUFFICIENCY 05/22/2010  . Essential hypertension, benign 10/09/2009  . Mitral regurgitation 10/09/2009  .  HYPERLIPIDEMIA-MIXED 02/12/2009  . Palpitations 02/12/2009    Past Surgical History:  Procedure Laterality Date  . 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 hiatal hernia. Antral erosions status post gastric biopsy.  Marland Kitchen. MALONEY DILATION N/A 04/17/2014   Procedure: Elease HashimotoMALONEY DILATION;  Surgeon: Corbin Adeobert M Rourk, MD;  Location: [facility]O SUITE;  Service: Endoscopy;  Laterality: N/A;  . none      OB History    Gravida Para Term Preterm AB Living   5 5 4 1   5    SAB TAB Ectopic Multiple Live Births                   Home Medications    Prior to Admission medications   Medication Sig Start Date End Date Taking? Authorizing Provider  Albuterol Sulfate (PROAIR RESPICLICK) 108 (90 Base) MCG/ACT AEPB Inhale 1-2 puffs into the lungs daily as needed (for shortness of breath).   Yes Historical Provider, MD  ALPRAZolam Prudy Feeler(XANAX) 0.25 MG tablet Take 0.25 mg by mouth daily as needed for anxiety or sleep.    Yes Historical Provider, MD  amiodarone (PACERONE) 200 MG tablet TAKE 1 TABLET BY MOUTH EVERY DAY 07/29/15  Yes Jonelle SidleSamuel G McDowell, MD  atorvastatin (LIPITOR) 40 MG tablet Take 40 mg by mouth daily at 6 PM. 09/27/11  Yes Jonelle SidleSamuel G McDowell, MD  bisacodyl (BISACODYL) 5 MG EC tablet Take 5 mg by mouth daily as needed for mild constipation.   Yes  Historical Provider, MD  bismuth subsalicylate (PEPTO BISMOL) 262 MG/15ML suspension Take 30 mLs by mouth daily as needed for indigestion.   Yes Historical Provider, MD  Carbamide Peroxide (EAR WAX DROPS OT) Place 1 drop in ear(s) daily as needed (ear wax removal).   Yes Historical Provider, MD  cetirizine (ZYRTEC) 10 MG tablet Take 5 mg by mouth daily as needed for allergies or rhinitis.    Yes Historical Provider, MD  cloNIDine (CATAPRES) 0.1 MG tablet Take 1 tablet 0.1 mg daily in the evening. Patient taking differently: Take 0.1 mg by mouth every evening.  Take 1 tablet 0.1 mg daily in the evening. 02/25/16  Yes Jonelle Sidle, MD  COD LIVER OIL PO Take 15 mLs by mouth daily.   Yes Historical Provider, MD  ELIQUIS 2.5 MG TABS tablet TAKE 1 TABLET BY MOUTH TWICE DAILY 12/26/15  Yes Jonelle Sidle, MD  ferrous sulfate 325 (65 FE) MG tablet Take 325 mg by mouth daily with breakfast.   Yes Historical Provider, MD  fluticasone furoate-vilanterol (BREO ELLIPTA) 200-25 MCG/INH AEPB Inhale 1 puff into the lungs daily.   Yes Historical Provider, MD  furosemide (LASIX) 20 MG tablet Take 1 tablet (20 mg total) by mouth daily as needed for fluid. 04/26/16  Yes Jonelle Sidle, MD  glimepiride (AMARYL) 1 MG tablet Take 1 mg by mouth daily before breakfast.    Yes Historical Provider, MD  guaiFENesin-dextromethorphan (ROBITUSSIN DM) 100-10 MG/5ML syrup Take 5 mLs by mouth every 4 (four) hours as needed for cough (SUGAR FREE).   Yes Historical Provider, MD  levothyroxine (SYNTHROID, LEVOTHROID) 25 MCG tablet Take 25 mcg by mouth daily before breakfast.   Yes Historical Provider, MD  lisinopril (PRINIVIL,ZESTRIL) 20 MG tablet Take 1 tablet (20 mg total) by mouth 2 (two) times daily. 06/18/15  Yes Jonelle Sidle, MD  metoprolol tartrate (LOPRESSOR) 25 MG tablet TAKE 1 TABLET BY MOUTH TWICE DAILY 12/26/15  Yes Jonelle Sidle, MD  nystatin (MYCOSTATIN) 100000 UNIT/ML suspension Use as directed 5 mLs in the mouth or throat 4 (four) times daily.   Yes Historical Provider, MD  omeprazole (PRILOSEC) 20 MG capsule TAKE 1 CAPSULE BY MOUTH EVERY DAY 09/29/15  Yes Tiffany Kocher, PA-C  spironolactone (ALDACTONE) 25 MG tablet Take 1 tablet (25 mg total) by mouth daily. 02/25/16  Yes Jonelle Sidle, MD  Aspirin Buf,CaCarb-MgCarb-MgO, (BUFFERIN PO) Take 1 tablet by mouth daily as needed (pain).    Historical Provider, MD  polyethylene glycol (MIRALAX / GLYCOLAX) packet Take 17 g by mouth daily as needed for mild constipation.    Historical Provider, MD  potassium chloride SA  (K-DUR,KLOR-CON) 10 MEQ tablet Take 2 tablets (20 mEq total) by mouth daily. 05/15/16   Bethann Berkshire, MD  potassium chloride SA (K-DUR,KLOR-CON) 10 MEQ tablet Take 2 tablets (20 mEq total) by mouth daily. 05/15/16   Bethann Berkshire, MD    Family History Family History  Problem Relation Age of Onset  . Heart failure Mother     Died in her 31s  . Tuberculosis Father     Died at young age  . Cancer Brother   . Arthritis    . Colon cancer      Social History Social History  Substance Use Topics  . Smoking status: Never Smoker  . Smokeless tobacco: Never Used  . Alcohol use No     Allergies   Chocolate   Review of Systems Review of Systems  Constitutional: Negative  for appetite change and fatigue.  HENT: Negative for congestion, ear discharge and sinus pressure.   Eyes: Negative for discharge.  Respiratory: Negative for cough.   Cardiovascular: Negative for chest pain.  Gastrointestinal: Negative for abdominal pain and diarrhea.  Genitourinary: Negative for frequency and hematuria.  Musculoskeletal: Negative for back pain.  Skin: Negative for rash.  Neurological: Positive for dizziness. Negative for seizures and headaches.  Psychiatric/Behavioral: Negative for hallucinations.     Physical Exam Updated Vital Signs BP 186/66 (BP Location: Left Arm)   Pulse 79   Temp 99.2 F (37.3 C) (Oral)   Resp 16   Ht 5\' 6"  (1.676 m)   Wt 140 lb (63.5 kg)   SpO2 100%   BMI 22.60 kg/m   Physical Exam  Constitutional: She is oriented to person, place, and time. She appears well-developed.  HENT:  Head: Normocephalic.  Eyes: Conjunctivae and EOM are normal. No scleral icterus.  Neck: Neck supple. No thyromegaly present.  Cardiovascular: Normal rate and regular rhythm.  Exam reveals no gallop and no friction rub.   No murmur heard. Pulmonary/Chest: No stridor. She has no wheezes. She has no rales. She exhibits no tenderness.  Abdominal: She exhibits no distension. There is no  tenderness. There is no rebound.  Musculoskeletal: Normal range of motion. She exhibits no edema.  Lymphadenopathy:    She has no cervical adenopathy.  Neurological: She is oriented to person, place, and time. She exhibits normal muscle tone. Coordination normal.  Skin: No rash noted. No erythema.  Psychiatric: She has a normal mood and affect. Her behavior is normal.     ED Treatments / Results  Labs (all labs ordered are listed, but only abnormal results are displayed) Labs Reviewed  I-STAT CHEM 8, ED - Abnormal; Notable for the following:       Result Value   Potassium 3.2 (*)    Chloride 98 (*)    Calcium, Ion 1.10 (*)    Hemoglobin 10.9 (*)    HCT 32.0 (*)    All other components within normal limits    EKG  EKG Interpretation None       Radiology No results found.  Procedures Procedures (including critical care time)  Medications Ordered in ED Medications  potassium chloride SA (K-DUR,KLOR-CON) CR tablet 40 mEq (not administered)     Initial Impression / Assessment and Plan / ED Course  I have reviewed the triage vital signs and the nursing notes.  Pertinent labs & imaging results that were available during my care of the patient were reviewed by me and considered in my medical decision making (see chart for details).  Clinical Course     Hypertension and hypokalemia. Patient given small vessel potassium. Patient's blood pressure improved in the emergency department she will be discharged home to follow-up with her PCP  Final Clinical Impressions(s) / ED Diagnoses   Final diagnoses:  Essential hypertension    New Prescriptions New Prescriptions   POTASSIUM CHLORIDE SA (K-DUR,KLOR-CON) 10 MEQ TABLET    Take 2 tablets (20 mEq total) by mouth daily.   POTASSIUM CHLORIDE SA (K-DUR,KLOR-CON) 10 MEQ TABLET    Take 2 tablets (20 mEq total) by mouth daily.     Bethann BerkshireJoseph Latoyia Tecson, MD 05/15/16 [phone]

## 2016-05-15 NOTE — ED Triage Notes (Signed)
Pt reports she took her BP today and readings were as high as "280/80", states minor blurring of vision started this morning. Denies dizziness; face is symmetric. Had esophageal stretching this week.

## 2016-05-18 ENCOUNTER — Encounter (HOSPITAL_COMMUNITY): Payer: Self-pay | Admitting: Internal Medicine

## 2016-05-19 ENCOUNTER — Telehealth: Payer: Self-pay | Admitting: Internal Medicine

## 2016-05-19 NOTE — Telephone Encounter (Signed)
Please call patient regarding her stomach, she is having pain

## 2016-05-20 ENCOUNTER — Telehealth: Payer: Self-pay | Admitting: Internal Medicine

## 2016-05-20 MED ORDER — ONDANSETRON HCL 4 MG PO TABS: 4.0000 mg | ORAL_TABLET | Freq: Two times a day (BID) | ORAL | 0 refills | Status: DC | PRN

## 2016-05-20 MED ORDER — ONDANSETRON HCL 4 MG/5ML PO SOLN: 4.0000 mg | Freq: Two times a day (BID) | ORAL | 0 refills | Status: DC | PRN

## 2016-05-20 NOTE — Telephone Encounter (Signed)
Pt is aware.  

## 2016-05-20 NOTE — Telephone Encounter (Signed)
I have already sent in zofran tablet. I will send in RX for liquid zofran. Please call [facility] and let them know of change.

## 2016-05-20 NOTE — Telephone Encounter (Signed)
Patient called back wanting to know if something can be called in for her in liquid form because she has a hard time swallowing.

## 2016-05-20 NOTE — Telephone Encounter (Signed)
Called pharmacy- LM on their voicemail.

## 2016-05-20 NOTE — Telephone Encounter (Signed)
RX for zofran. One time rx. Will have to have ov if persistent nausea.

## 2016-05-20 NOTE — Telephone Encounter (Signed)
Routing to [facility]SL- see other phone note.

## 2016-05-20 NOTE — Telephone Encounter (Signed)
Spoke with the pt, she said she wasn't having any pain, just a lot of nausea that lasts all day. No vomiting. Pt wants to know if we can call in something for her. [facility] uses [facility].

## 2016-05-20 NOTE — Addendum Note (Signed)
Addended by: Tiffany KocherLEWIS, Evaan Tidwell S on: 05/20/2016 03:38 PM   Modules accepted: Orders

## 2016-05-24 ENCOUNTER — Other Ambulatory Visit: Payer: Self-pay | Admitting: Cardiology

## 2016-05-28 ENCOUNTER — Ambulatory Visit: Payer: Self-pay | Admitting: Cardiology

## 2016-05-31 ENCOUNTER — Other Ambulatory Visit: Payer: Self-pay

## 2016-06-01 ENCOUNTER — Other Ambulatory Visit: Payer: Self-pay | Admitting: Gastroenterology

## 2016-06-03 NOTE — Progress Notes (Signed)
Cardiology Office Note  Date: 06/04/2016   ID: [name], DOB Nov 14, 1926, MRN [id number]  PCP: Charlene Obey, MD  Primary Cardiologist: Charlene Dell, MD   Chief Complaint  Patient presents with  . Atrial Fibrillation    History of Present Illness: [name] is an 83 y.o. female last seen in October 2017. She is here today with her son whom I am meeting for the first time. [name] reports a variety of concerns ranging from difficulty swallowing, intermittent cough which has improved with cough syrup, questions about her medications, also shortness of breath that occurs sometimes at nighttime requiring use of her MDI.  She still misses her late husband very much, it is obvious that she has had substantial difficulty adjusting to this. Her children help look in on her.  Records indicate EGD in December 2017 with Dr. Jena [name], patient had a moderate Schatzki ring that was dilated as well as a medium sized hiatal hernia.  I went over her medications in detail with the patient and her son. He asked about changing her Lopressor to once daily formulation and we plan to initiate Toprol-XL instead.  Past Medical History:  Diagnosis Date  . Anxiety   . Atrial fibrillation (HCC)   . COPD (chronic obstructive pulmonary disease) (HCC)   . Depression   . Hyperlipidemia   . Hypertension   . Hypothyroidism   . Mitral regurgitation    Mild  . Palpitations    PAT/EAT/NSVT, normal LVEF  . Type 2 diabetes mellitus (HCC)     Past Surgical History:  Procedure Laterality Date  . 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 hiatal hernia. Antral erosions status post gastric biopsy.  . ESOPHAGOGASTRODUODENOSCOPY N/A 05/12/2016   Procedure: ESOPHAGOGASTRODUODENOSCOPY (EGD);  Surgeon: Charlene Ade, MD;  Location: [facility] ENDO SUITE;  Service: Endoscopy;  Laterality: N/A;  730     . MALONEY DILATION N/A 04/17/2014   Procedure: [name] DILATION;  Surgeon: Charlene Ade, MD;  Location: [facility] ENDO SUITE;  Service: Endoscopy;  Laterality: N/A;  [name] DILATION N/A 05/12/2016   Procedure: [name] DILATION;  Surgeon: Charlene Ade, MD;  Location: [facility] END[facility] SU[facility];  Service: Endoscopy;  Laterality: N/A;  . none      Current Outpatient Prescriptions  Medication Sig Dispense Refill  . Albuterol Sulfate (PROAIR RESPICLICK) 108 (90 Base) MCG/ACT AEPB Inhale 1-2 puffs into the lungs daily as needed (for shortness of breath).    . ALPRAZolam (XANAX) 0.25 MG tablet Take 0.25 mg by mouth daily as needed for anxiety or sleep.     Marland Kitchen amiodarone (PACERONE) 200 MG tablet TAKE 1 TABLET BY MOUTH EVERY DAY 90 tablet 3  . Aspirin Buf,CaCarb-MgCarb-MgO, (BUFFERIN PO) Take 1 tablet by mouth daily as needed (pain).    Marland Kitchen atorvastatin (LIPITOR) 40 MG tablet Take 40 mg by mouth daily at 6 PM.    . bisacodyl (BISACODYL) 5 MG EC tablet Take 5 mg by mouth daily as needed for mild constipation.    . bismuth subsalicylate (PEPTO BISMOL) 262 MG/15ML suspension Take 30 mLs by mouth daily as needed for indigestion.    . Carbamide Peroxide (EAR WAX DROPS OT) Place 1 drop in ear(s) daily as needed (ear wax removal).    . cetirizine (ZYRTEC) 10 MG tablet Take 5 mg by mouth daily as needed for allergies or rhinitis.     . cloNIDine (CATAPRES)  0.1 MG tablet Take 1 tablet 0.1 mg daily in the evening. (Patient taking differently: Take 0.1 mg by mouth every evening. Take 1 tablet 0.1 mg daily in the evening.) 30 tablet 3  . COD LIVER OIL PO Take 15 mLs by mouth daily.    Marland Kitchen ELIQUIS 2.5 MG TABS tablet TAKE 1 TABLET BY MOUTH TWICE DAILY 60 tablet 6  . ferrous sulfate 325 (65 FE) MG tablet Take 325 mg by mouth daily with breakfast.    . fluticasone furoate-vilanterol (BREO ELLIPTA) 200-25 MCG/INH AEPB Inhale 1 puff into the lungs daily.    . furosemide (LASIX) 20 MG tablet Take 1 tablet (20 mg total) by mouth daily as  needed for fluid. 30 tablet 1  . glimepiride (AMARYL) 1 MG tablet Take 1 mg by mouth daily before breakfast.     . guaiFENesin-dextromethorphan (ROBITUSSIN DM) 100-10 MG/5ML syrup Take 5 mLs by mouth every 4 (four) hours as needed for cough (SUGAR FREE).    Marland Kitchen levothyroxine (SYNTHROID, LEVOTHROID) 25 MCG tablet Take 25 mcg by mouth daily before breakfast.    . lisinopril (PRINIVIL,ZESTRIL) 20 MG tablet TAKE ONE TABLET BY MOUTH TWICE DAILY 180 tablet 3  . metoprolol tartrate (LOPRESSOR) 25 MG tablet TAKE 1 TABLET BY MOUTH TWICE DAILY 60 tablet 6  . nystatin (MYCOSTATIN) 100000 UNIT/ML suspension Use as directed 5 mLs in the mouth or throat 4 (four) times daily.    Marland Kitchen omeprazole (PRILOSEC) 20 MG capsule TAKE 1 CAPSULE BY MOUTH EVERY DAY 30 capsule 5  . ondansetron (ZOFRAN) 4 MG/5ML solution TAKE ONE TEASPOONFUL ( ) BY MOUTH 2 TIMES DAILY AS NEEDED FOR NAUSEA OR VOMITING. 100 mL 0  . polyethylene glycol (MIRALAX / GLYCOLAX) packet Take 17 g by mouth daily as needed for mild constipation.    . potassium chloride SA (K-DUR,KLOR-CON) 10 MEQ tablet Take 2 tablets (20 mEq total) by mouth daily. 30 tablet 0  . potassium chloride SA (K-DUR,KLOR-CON) 10 MEQ tablet Take 2 tablets (20 mEq total) by mouth daily. 3 tablet 0  . spironolactone (ALDACTONE) 25 MG tablet Take 1 tablet (25 mg total) by mouth daily. 30 tablet 6   No current facility-administered medications for this visit.    Allergies:  Chocolate   Social History: The patient  reports that she has never smoked. She has never used smokeless tobacco. She reports that she does not drink alcohol or use drugs.   ROS:  Please see the history of present illness. Otherwise, complete review of systems is positive for hearing loss.  All other systems are reviewed and negative.   Physical Exam: VS:  BP (!) 168/60   Pulse 84   Ht 5\' 6"  (1.676 m)   Wt 137 lb (62.1 kg)   SpO2 99%   BMI 22.11 kg/m , BMI Body mass index is 22.11 kg/m.  Wt Readings from  Last 3 Encounters:  06/04/16 137 lb (62.1 kg)  05/15/16 140 lb (63.5 kg)  04/26/16 145 lb (65.8 kg)    General: Elderly woman, appears comfortable at rest. HEENT: Conjunctiva and lids normal, oropharynx clear. Neck: Supple, no elevated JVP, no thyromegaly. Lungs: Clear to auscultation, nonlabored breathing at rest. Cardiac: Regular rate and rhythm, no S3, paradoxically split S2, soft systolic murmur, no pericardial rub. Abdomen: Soft, nontender, bowel sounds present. Extremities: Chronic appearing lower leg edema, distal pulses 2+. Skin: Warm and dry. Musculoskeletal: No kyphosis. Neuropsychiatric: Alert and oriented 3, affect appropriate.  ECG: I personally reviewed the tracing from 05/15/2016 which  showed sinus rhythm with left bundle-branch block.  Recent Labwork: 03/14/2016: Magnesium 1.9 04/22/2016: ALT 30; AST 26; B Natriuretic Peptide 62.0; Platelets 264 05/15/2016: BUN 7; Creatinine, Ser 0.90; Hemoglobin 10.9; Potassium 3.2; Sodium 138     Component Value Date/Time   CHOL 142 07/14/2014 0456   TRIG 32 07/14/2014 0456   HDL 58 07/14/2014 0456   CHOLHDL 2.4 07/14/2014 0456   VLDL 6 07/14/2014 0456   LDLCALC 78 07/14/2014 0456    Other Studies Reviewed Today:  Echocardiogram 07/14/2014: Study Conclusions  - Left ventricle: The cavity size was normal. Systolic function was normal. The estimated ejection fraction was in the range of 50% to 55%. Wall motion was normal; there were no regional wall motion abnormalities. There was a reduced contribution of atrial contraction to ventricular filling, due to increased ventricular diastolic pressure or atrial contractile dysfunction. Doppler parameters are consistent with a reversible restrictive pattern, indicative of decreased left ventricular diastolic compliance and/or increased left atrial pressure (grade 3 diastolic dysfunction). - Ventricular septum: Septal motion showed paradox. These changes are  consistent with intraventricular conduction delay. - Aortic valve: Trileaflet; normal thickness, mildly calcified leaflets. - Mitral valve: There was moderate regurgitation. - Left atrium: The atrium was mildly to moderately dilated. - Tricuspid valve: There was moderate regurgitation. - Pulmonary arteries: PA peak pressure: 34 mm Hg (S).  Assessment and Plan:  1. Paroxysmal atrial fibrillation. Plan to continue amiodarone and Eliquis. We will switch Lopressor to Toprol-XL 50 mg daily.  2. Essential hypertension. Current regimen includes clonidine, Aldactone, lisinopril, and now Toprol-XL instead of Lopressor.  3. Moderate mitral regurgitation, managed conservatively.  4.  Acute COPD. She follows with Dr. Juanetta GoslingHawkins and is on [place] and [employer].  Current medicines were reviewed with the patient today.  Disposition: Follow-up in 4 months.   Signed, Jonelle SidleSamuel G. Evangaline Jou, MD, [facility] 06/04/2016 11:03 AM    [facility] [address], [place], [place] [address] Phone: [phone]; Fax: [phone]

## 2016-06-04 ENCOUNTER — Encounter: Payer: Self-pay | Admitting: Cardiology

## 2016-06-04 ENCOUNTER — Ambulatory Visit (INDEPENDENT_AMBULATORY_CARE_PROVIDER_SITE_OTHER): Payer: Medicare Other | Admitting: Cardiology

## 2016-06-04 VITALS — BP 168/60 | HR 84 | Ht 66.0 in | Wt 137.0 lb

## 2016-06-04 DIAGNOSIS — J449 Chronic obstructive pulmonary disease, unspecified: Secondary | ICD-10-CM

## 2016-06-04 DIAGNOSIS — I340 Nonrheumatic mitral (valve) insufficiency: Secondary | ICD-10-CM

## 2016-06-04 DIAGNOSIS — I10 Essential (primary) hypertension: Secondary | ICD-10-CM | POA: Diagnosis not present

## 2016-06-04 DIAGNOSIS — I480 Paroxysmal atrial fibrillation: Secondary | ICD-10-CM | POA: Diagnosis not present

## 2016-06-04 MED ORDER — METOPROLOL SUCCINATE ER 50 MG PO TB24: 50.0000 mg | ORAL_TABLET | Freq: Every day | ORAL | 3 refills | Status: DC

## 2016-06-04 NOTE — Patient Instructions (Addendum)
Your physician recommends that you schedule a follow-up appointment in: 4 months with Dr Diona BrownerMcDowell    Stop metoprolol (lopressor)    START Toprol XL 50 mg daily      Thank you for choosing [facility] !

## 2016-06-10 ENCOUNTER — Encounter (HOSPITAL_COMMUNITY): Payer: Self-pay | Admitting: *Deleted

## 2016-06-10 ENCOUNTER — Emergency Department (HOSPITAL_COMMUNITY)
Admission: EM | Admit: 2016-06-10 | Discharge: 2016-06-10 | Disposition: A | Discharge status: Home / Self Care | Payer: Medicare Other | Attending: Emergency Medicine | Admitting: Emergency Medicine

## 2016-06-10 ENCOUNTER — Emergency Department (HOSPITAL_COMMUNITY): Payer: Medicare Other

## 2016-06-10 DIAGNOSIS — R0602 Shortness of breath: Secondary | ICD-10-CM | POA: Insufficient documentation

## 2016-06-10 DIAGNOSIS — I5032 Chronic diastolic (congestive) heart failure: Secondary | ICD-10-CM | POA: Diagnosis not present

## 2016-06-10 DIAGNOSIS — J449 Chronic obstructive pulmonary disease, unspecified: Secondary | ICD-10-CM | POA: Diagnosis not present

## 2016-06-10 DIAGNOSIS — Z7984 Long term (current) use of oral hypoglycemic drugs: Secondary | ICD-10-CM | POA: Diagnosis not present

## 2016-06-10 DIAGNOSIS — Z79899 Other long term (current) drug therapy: Secondary | ICD-10-CM | POA: Diagnosis not present

## 2016-06-10 DIAGNOSIS — E119 Type 2 diabetes mellitus without complications: Secondary | ICD-10-CM | POA: Diagnosis not present

## 2016-06-10 DIAGNOSIS — I110 Hypertensive heart disease with heart failure: Secondary | ICD-10-CM | POA: Insufficient documentation

## 2016-06-10 LAB — BASIC METABOLIC PANEL
ANION GAP: 7 (ref 5–15)
BUN: 22 mg/dL — ABNORMAL HIGH (ref 6–20)
CO2: 24 mmol/L (ref 22–32)
CREATININE: 1.79 mg/dL — AB (ref 0.44–1.00)
Calcium: 9.1 mg/dL (ref 8.9–10.3)
Chloride: 101 mmol/L (ref 101–111)
GFR, EST AFRICAN AMERICAN: 28 mL/min — AB (ref >60)
GFR, EST NON AFRICAN AMERICAN: 24 mL/min — AB (ref >60)
GLUCOSE: 116 mg/dL — AB (ref 65–99)
Potassium: 4.6 mmol/L (ref 3.5–5.1)
Sodium: 132 mmol/L — ABNORMAL LOW (ref 135–145)

## 2016-06-10 LAB — CBC
HCT: 31.8 % — ABNORMAL LOW (ref 36.0–46.0)
HEMOGLOBIN: 10.6 g/dL — AB (ref 12.0–15.0)
MCH: 30.1 pg (ref 26.0–34.0)
MCHC: 33.3 g/dL (ref 30.0–36.0)
MCV: 90.3 fL (ref 78.0–100.0)
PLATELETS: 407 10*3/uL — AB (ref 150–400)
RBC: 3.52 MIL/uL — AB (ref 3.87–5.11)
RDW: 17 % — ABNORMAL HIGH (ref 11.5–15.5)
WBC: 5.7 10*3/uL (ref 4.0–10.5)

## 2016-06-10 MED ORDER — IPRATROPIUM-ALBUTEROL 0.5-2.5 (3) MG/3ML IN SOLN
3.0000 mL | Freq: Once | RESPIRATORY_TRACT | Status: AC
  Administered 2016-06-10: 3 mL via RESPIRATORY_TRACT
  Filled 2016-06-10: qty 3

## 2016-06-10 NOTE — Discharge Instructions (Signed)
Usual albuterol inhaler 2 puffs every 6 hours on a daily basis. Return for any new or worse symptoms. Chest x-ray was negative for pneumonia. Make appointment to follow-up with your regular doctor.

## 2016-06-10 NOTE — ED Triage Notes (Addendum)
Pt c/o SOB and productive cough that comes and goes x 2 weeks. Pt usually uses her inhaler and feels better, but reports that today it didn't work.

## 2016-06-10 NOTE — ED Provider Notes (Signed)
[facility]-EM[facility] DEPT Provider Note   CSN: [id number] Arrival date & time: 06/10/16  1756     History   Chief Complaint Chief Complaint  Patient presents with  . Shortness of Breath    HPI [name] is a 83 y.o. female.  Patient presents with concern for shortness of breath and occasional productive cough on and off for 2 weeks. Family brought her in because she was saying she was feeling very short of breath here today. Normally she can use her inhaler and helps. Family not sure how often she is using an inhaler.      Past Medical History:  Diagnosis Date  . Anxiety   . Atrial fibrillation (HCC)   . COPD (chronic obstructive pulmonary disease) (HCC)   . Depression   . Hyperlipidemia   . Hypertension   . Hypothyroidism   . Mitral regurgitation    Mild  . Palpitations    PAT/EAT/NSVT, normal LVEF  . Type 2 diabetes mellitus [facility])     Patient Active Problem List   Diagnosis Date Noted  . GERD (gastroesophageal reflux disease) 03/17/2016  . Constipation 10/29/2015  . Gastric erosions 02/03/2015  . Sinus bradycardia 09/26/2014  . Dyspnea on exertion 09/18/2014  . Chronic diastolic heart failure (HCC) 09/18/2014  . Dizziness 09/18/2014  . Atrial flutter with rapid ventricular response (HCC)   . Atrial fibrillation with RVR (HCC) 09/09/2014  . Atrial flutter (HCC) 07/13/2014  . Hyponatremia 07/13/2014  . Diabetes (HCC) 07/13/2014  . Hypothyroidism 07/13/2014  . Tachycardia 07/13/2014  . Dysphagia, pharyngoesophageal phase 04/16/2014  . Neurogenic pain of lower extremity 03/15/2012  . Left bundle branch block 03/02/2011  . RENAL INSUFFICIENCY 05/22/2010  . Essential hypertension, benign 10/09/2009  . Mitral regurgitation 10/09/2009  . HYPERLIPIDEMIA-MIXED 02/12/2009  . Palpitations 02/12/2009    Past Surgical History:  Procedure Laterality Date  . ESOPHAGOGASTRODUODENOSCOPY N/A 04/17/2014   RMR: probable cervical esophageal web and critical Schzki's  ring status post dilation as described above. hiatal hernia. Antral erosions status post gastric biopsy.  . ESOPHAGOGASTRODUODENOSCOPY N/A 05/12/2016   Procedure: ESOPHAGOGASTRODUODENOSCOPY (EGD);  Surgeon: Corbin Ade, MD;  Location: [facility] ENDO SUITE;  Service: Endoscopy;  Laterality: N/A;  730   . MALONEY DILATION N/A 04/17/2014   Procedure: [name] DILATION;  Surgeon: Corbin Ade, MD;  Location: [facility] ENDO SUITE;  Service: Endoscopy;  Laterality: N/A;  [name] DILATION N/A 05/12/2016   Procedure: [name] DILATION;  Surgeon: Corbin Ade, MD;  Location: [facility] ENDO SUITE;  Service: Endoscopy;  Laterality: N/A;  . none      OB History    Gravida Para Term Preterm AB Living   5 5 4 1   5    SAB TAB Ectopic Multiple Live Births                   Home Medications    Prior to Admission medications   Medication Sig Start Date End Date Taking? Authorizing Provider  Albuterol Sulfate (PROAIR RESPICLICK) 108 (90 Base) MCG/ACT AEPB Inhale 1-2 puffs into the lungs daily as needed (for shortness of breath).   Yes Historical Provider, MD  ALPRAZolam Prudy Feeler) 0.25 MG tablet Take 0.25 mg by mouth daily as needed for anxiety or sleep.    Yes Historical Provider, MD  amiodarone (PACERONE) 200 MG tablet TAKE 1 TABLET BY MOUTH EVERY DAY 07/29/15  Yes Jonelle Sidle, MD  Aspirin Buf,CaCarb-MgCarb-MgO, (BUFFERIN PO) Take 1 tablet by mouth daily as needed (pain).  Yes Historical Provider, MD  atorvastatin (LIPITOR) 40 MG tablet Take 40 mg by mouth daily at 6 PM. 09/27/11  Yes Jonelle Sidle, MD  bisacodyl (BISACODYL) 5 MG EC tablet Take 5 mg by mouth daily as needed for mild constipation.   Yes Historical Provider, MD  Carbamide Peroxide (EAR WAX DROPS OT) Place 1 drop in ear(s) daily as needed (ear wax removal).   Yes Historical Provider, MD  cetirizine (ZYRTEC) 10 MG tablet Take 5 mg by mouth daily as needed for allergies or rhinitis.    Yes Historical Provider, MD  cloNIDine (CATAPRES) 0.1 MG tablet  Take 1 tablet 0.1 mg daily in the evening. Patient taking differently: Take 0.1 mg by mouth every evening. Take 1 tablet 0.1 mg daily in the evening. 02/25/16  Yes Jonelle Sidle, MD  COD LIVER OIL PO Take 15 mLs by mouth daily.   Yes Historical Provider, MD  ELIQUIS 2.5 MG TABS tablet TAKE 1 TABLET BY MOUTH TWICE DAILY 12/26/15  Yes Jonelle Sidle, MD  ferrous sulfate 325 (65 FE) MG tablet Take 325 mg by mouth daily with breakfast.   Yes Historical Provider, MD  fluconazole (DIFLUCAN) 100 MG tablet Take 1 tablet by mouth daily. 05/28/16  Yes Historical Provider, MD  fluticasone furoate-vilanterol (BREO ELLIPTA) 200-25 MCG/INH AEPB Inhale 1 puff into the lungs daily.   Yes Historical Provider, MD  furosemide (LASIX) 20 MG tablet Take 1 tablet (20 mg total) by mouth daily as needed for fluid. 04/26/16  Yes Jonelle Sidle, MD  glimepiride (AMARYL) 1 MG tablet Take 1 mg by mouth daily before breakfast.    Yes Historical Provider, MD  guaiFENesin-dextromethorphan (ROBITUSSIN DM) 100-10 MG/5ML syrup Take 5 mLs by mouth every 4 (four) hours as needed for cough (SUGAR FREE).   Yes Historical Provider, MD  levothyroxine (SYNTHROID, LEVOTHROID) 25 MCG tablet Take 25 mcg by mouth daily before breakfast.   Yes Historical Provider, MD  lisinopril (PRINIVIL,ZESTRIL) 20 MG tablet TAKE ONE TABLET BY MOUTH TWICE DAILY 05/25/16  Yes Jonelle Sidle, MD  metoprolol succinate (TOPROL-XL) 50 MG 24 hr tablet Take 1 tablet (50 mg total) by mouth daily. Take with or immediately following a meal. 06/04/16 09/02/16 Yes Jonelle Sidle, MD  omeprazole (PRILOSEC) 20 MG capsule TAKE 1 CAPSULE BY MOUTH EVERY DAY 09/29/15  Yes Tiffany Kocher, PA-C  ondansetron (ZOFRAN) 4 MG/5ML solution TAKE ONE TEASPOONFUL ( ) BY MOUTH 2 TIMES DAILY AS NEEDED FOR NAUSEA OR VOMITING. 06/02/16  Yes Gelene Mink, NP  polyethylene glycol [facility] / Ethelene Hal) packet Take 17 g by mouth daily as needed for mild constipation.   Yes Historical Provider,  MD  potassium chloride SA (K-DUR,KLOR-CON) 10 MEQ tablet Take 2 tablets (20 mEq total) by mouth daily. 05/15/16  Yes Bethann Berkshire, MD  bismuth subsalicylate (PEPTO BISMOL) 262 MG/15ML suspension Take 30 mLs by mouth daily as needed for indigestion.    Historical Provider, MD  nystatin (MYCOSTATIN) 100000 UNIT/ML suspension Use as directed 5 mLs in the mouth or throat 4 (four) times daily.    Historical Provider, MD    Family History Family History  Problem Relation Age of Onset  . Heart failure Mother     Died in her 51s  . Tuberculosis Father     Died at young age  . Cancer Brother   . Arthritis    . Colon cancer      Social History Social History  Substance Use Topics  .  Smoking status: Never Smoker  . Smokeless tobacco: Never Used  . Alcohol use No     Allergies   Chocolate   Review of Systems Review of Systems  Constitutional: Negative for fever.  HENT: Negative for congestion.   Eyes: Negative for redness.  Respiratory: Positive for shortness of breath. Negative for wheezing.   Cardiovascular: Negative for chest pain.  Gastrointestinal: Negative for abdominal pain.  Genitourinary: Negative for dysuria.  Musculoskeletal: Negative for back pain.  Skin: Negative for rash.  Neurological: Negative for headaches.  Hematological: Does not bruise/bleed easily.  Psychiatric/Behavioral: Negative for confusion.     Physical Exam Updated Vital Signs BP 164/55   Pulse (!) 57   Temp 98.1 F (36.7 C) (Oral)   Resp 17   Ht 5\' 6"  (1.676 m)   Wt 62.1 kg   SpO2 100%   BMI 22.11 kg/m   Physical Exam  Constitutional: She is oriented to person, place, and time. She appears well-developed and well-nourished. No distress.  HENT:  Head: Normocephalic and atraumatic.  Mouth/Throat: Oropharynx is clear and moist. No oropharyngeal exudate.  White coating on tongue.  Eyes: EOM are normal. Pupils are equal, round, and reactive to light.  Neck: Normal range of motion. Neck  supple.  Cardiovascular: Normal rate, regular rhythm and normal heart sounds.   Pulmonary/Chest: Effort normal and breath sounds normal. No respiratory distress. She has no wheezes.  Abdominal: Soft. Bowel sounds are normal.  Musculoskeletal: Normal range of motion. She exhibits no edema.  Neurological: She is alert and oriented to person, place, and time. No cranial nerve deficit or sensory deficit. She exhibits normal muscle tone. Coordination normal.  Skin: Skin is warm.  Nursing note and vitals reviewed.    ED Treatments / Results  Labs (all labs ordered are listed, but only abnormal results are displayed) Labs Reviewed  BASIC METABOLIC PANEL - Abnormal; Notable for the following:       Result Value   Sodium 132 (*)    Glucose, Bld 116 (*)    BUN 22 (*)    Creatinine, Ser 1.79 (*)    GFR calc non Af Amer 24 (*)    GFR calc Af Amer 28 (*)    All other components within normal limits  CBC - Abnormal; Notable for the following:    RBC 3.52 (*)    Hemoglobin 10.6 (*)    HCT 31.8 (*)    RDW 17.0 (*)    Platelets 407 (*)    All other components within normal limits    EKG  EKG Interpretation  Date/Time:  Thursday June 10 2016 18:16:12 EST Ventricular Rate:  69 PR Interval:  172 QRS Duration: 146 QT Interval:  458 QTC Calculation: 490 R Axis:   26 Text Interpretation:  Normal sinus rhythm with sinus arrhythmia Left bundle branch block Abnormal ECG No significant change since last tracing Confirmed by Deny Chevez  MD, Cardale Dorer [phone]) on 06/10/2016 8:09:22 PM       Radiology Dg Chest 2 View  Result Date: 06/10/2016 CLINICAL DATA:  Shortness breath and intermittent cough for 2 weeks. History of atrial fibrillation, diabetes and hypertension. EXAM: CHEST  2 VIEW COMPARISON:  Chest radiograph April 22, 2016 FINDINGS: Cardiac silhouette is upper limits of normal in size. Calcified aortic knob. Similar hyperinflation. Mildly calcified aortic knob. Skin fold RIGHT chest. No  pleural effusion or focal consolidation. No pneumothorax. Osteopenia. Mild degenerative change of thoracic spine. IMPRESSION: Borderline cardiomegaly.  No acute cardiopulmonary process. Electronically  Signed   By: Awilda Metroourtnay  Bloomer M.D.   On: 06/10/2016 18:56    Procedures Procedures (including critical care time)  Medications Ordered in ED Medications  ipratropium-albuterol (DUONEB) 0.5-2.5 (3) MG/3ML nebulizer solution 3 mL (3 mLs Nebulization Given 06/10/16 2019)     Initial Impression / Assessment and Plan / ED Course  I have reviewed the triage vital signs and the nursing notes.  Pertinent labs & imaging results that were available during my care of the patient were reviewed by me and considered in my medical decision making (see chart for details).   patient with feeling shortness of breath. No wheezing but felt much better after albuterol Atrovent nebulizer. Chest x-ray negative for pneumonia. Patient feels much better. Labs without significant abnormalities. Patient encouraged to drink more fluids. Patient follow-up with primary care doctor. Patient will start using her albuterol inhaler every 6 hours.  Patient nontoxic no acute distress.    Final Clinical Impressions(s) / ED Diagnoses   Final diagnoses:  SOB (shortness of breath)    New Prescriptions New Prescriptions   No medications on file     Vanetta MuldersScott Elieser Tetrick, MD 06/10/16 2209

## 2016-06-16 ENCOUNTER — Ambulatory Visit: Payer: Self-pay | Admitting: Nurse Practitioner

## 2016-06-23 ENCOUNTER — Other Ambulatory Visit: Payer: Self-pay | Admitting: Cardiology

## 2016-06-24 ENCOUNTER — Ambulatory Visit (INDEPENDENT_AMBULATORY_CARE_PROVIDER_SITE_OTHER): Payer: Medicare Other | Admitting: Nurse Practitioner

## 2016-06-24 ENCOUNTER — Encounter: Payer: Self-pay | Admitting: Nurse Practitioner

## 2016-06-24 VITALS — BP 182/66 | HR 67 | Temp 97.9°F | Ht 66.0 in | Wt 140.2 lb

## 2016-06-24 DIAGNOSIS — R1314 Dysphagia, pharyngoesophageal phase: Secondary | ICD-10-CM

## 2016-06-24 DIAGNOSIS — K5900 Constipation, unspecified: Secondary | ICD-10-CM

## 2016-06-24 NOTE — Assessment & Plan Note (Signed)
The patient recently had an EGD with dilation of a moderate Schatzki's ring for dysphagia. Her dysphagia is improved, especially concerning solid foods. Occasional episode of pill dysphagia but from her description it sounds like she was not drinking water with taking pills. I encouraged her to take all of her pills one at a time with a good amount of water to help it pass. She states she will do this. Continue to monitor. Return for follow-up in 3 months.

## 2016-06-24 NOTE — Patient Instructions (Addendum)
1. I am giving you samples of Amitiza 8 g. Take a pill, twice a day, with a full stomach. 2. I am giving you samples to last 2 weeks. 3. Call us in 1-2 weeks and let us know if it is helping your constipation. Keep in mind it will likely take several days for it to become effective for you. 4. Return for follow-up in 3 months.

## 2016-06-24 NOTE — Progress Notes (Signed)
Referring Provider: Gareth Morgan, MD Primary Care Physician:  Milana Obey, MD Primary GI:  Dr. Jena Gauss  Chief Complaint  Patient presents with  . Dysphagia    at times with pills, eating ok    HPI:   [name] is a 83 y.o. female who presents For follow-up on dysphagia. The patient was last seen in our office on 04/26/2016 also for dysphagia. It was noted at that time a barium pill esophagram completed 03/24/2016 which found narrowing of the gastroesophageal junction which obstructed 12.5 mm barium tablet. At her last visit she was doing well overall but continuing with swallowing difficulties and is having decreased oral intake because of "fear of swallowing" per her daughter. Objectively has lost 10 pounds in the previous month. She was arranged for upper endoscopy.  EGD completed 05/12/2016 which found moderate Schatzki's ring status post dilation, medium sized hiatal hernia, normal duodenum, normal stomach. Recommended continue present medications, resume Eliquis the day after the procedure, continue omeprazole daily, no necessary repeat upper endoscopy.  Today she states she's doing well overall. Swallowing is improved. Occasional pill dysphagia which improves with water. No more solid food dysphagia. She has a "funny feeling" in her throat when she eats apple sauce or takes a pill. Has had stable weight or some increased weight recently and she is happy with that. Is also having constipation, states MiraLAX doesn't work well. Denies hematochezia, melena. Denies chest pain, dizziness, lightheadedness, syncope, near syncope. Denies any other upper or lower GI symptoms.  Past Medical History:  Diagnosis Date  . Anxiety   . Atrial fibrillation (HCC)   . COPD (chronic obstructive pulmonary disease) (HCC)   . Depression   . Hyperlipidemia   . Hypertension   . Hypothyroidism   . Mitral regurgitation    Mild  . Palpitations    PAT/EAT/NSVT, normal LVEF  . Type 2  diabetes mellitus (HCC)     Past Surgical History:  Procedure Laterality Date  . 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 hiatal hernia. Antral erosions status post gastric biopsy.  . ESOPHAGOGASTRODUODENOSCOPY N/A 05/12/2016   Procedure: ESOPHAGOGASTRODUODENOSCOPY (EGD);  Surgeon: Corbin Ade, MD;  Location: [facility] ENDO SU[facility];  Service: Endoscopy;  Laterality: N/A;  730   . MALONEY DILATION N/A 04/17/2014   Procedure: [name] DILATION;  Surgeon: Corbin Ade, MD;  Location: [facility] ENDO SUITE;  Service: Endoscopy;  Laterality: N/A;  [name] DILATION N/A 05/12/2016   Procedure: [name] DILATION;  Surgeon: Corbin Ade, MD;  Location: [facility] ENDO SUITE;  Service: Endoscopy;  Laterality: N/A;  . none      Current Outpatient Prescriptions  Medication Sig Dispense Refill  . ALPRAZolam (XANAX) 0.25 MG tablet Take 0.25 mg by mouth daily as needed for anxiety or sleep.     Marland Kitchen amiodarone (PACERONE) 200 MG tablet TAKE 1 TABLET BY MOUTH EVERY DAY 90 tablet 3  . atorvastatin (LIPITOR) 40 MG tablet Take 40 mg by mouth daily at 6 PM.    . bisacodyl (BISACODYL) 5 MG EC tablet Take 5 mg by mouth daily as needed for mild constipation.    . bismuth subsalicylate (PEPTO BISMOL) 262 MG/15ML suspension Take 30 mLs by mouth daily as needed for indigestion.    . cetirizine (ZYRTEC) 10 MG tablet Take 5 mg by mouth daily as needed for allergies or rhinitis.     . cloNIDine (CATAPRES) 0.1 MG tablet  TAKE 1 TABLET BY MOUTH EVERY EVENING 30 tablet 6  . COD LIVER OIL PO Take 15 mLs by mouth daily.    Marland Kitchen ELIQUIS 2.5 MG TABS tablet TAKE 1 TABLET BY MOUTH TWICE DAILY 60 tablet 6  . ferrous sulfate 325 (65 FE) MG tablet Take 325 mg by mouth daily with breakfast.    . furosemide (LASIX) 20 MG tablet TAKE 1 TABLET BY MOUTH EVERY DAY AS NEEDED FOR FLUID 30 tablet 6  . glimepiride (AMARYL) 1 MG tablet Take 1 mg by mouth  daily before breakfast.     . guaiFENesin-dextromethorphan (ROBITUSSIN DM) 100-10 MG/5ML syrup Take 5 mLs by mouth every 4 (four) hours as needed for cough (SUGAR FREE).    Marland Kitchen levothyroxine (SYNTHROID, LEVOTHROID) 25 MCG tablet Take 25 mcg by mouth daily before breakfast.    . lisinopril (PRINIVIL,ZESTRIL) 20 MG tablet TAKE ONE TABLET BY MOUTH TWICE DAILY 180 tablet 3  . metoprolol succinate (TOPROL-XL) 50 MG 24 hr tablet Take 1 tablet (50 mg total) by mouth daily. Take with or immediately following a meal. 90 tablet 3  . omeprazole (PRILOSEC) 20 MG capsule TAKE 1 CAPSULE BY MOUTH EVERY DAY 30 capsule 5  . ondansetron (ZOFRAN) 4 MG/5ML solution TAKE ONE TEASPOONFUL ( ) BY MOUTH 2 TIMES DAILY AS NEEDED FOR NAUSEA OR VOMITING. 100 mL 0  . potassium chloride SA (K-DUR,KLOR-CON) 10 MEQ tablet Take 2 tablets (20 mEq total) by mouth daily. 3 tablet 0  . spironolactone (ALDACTONE) 25 MG tablet Take 25 mg by mouth daily.    Marland Kitchen umeclidinium bromide (INCRUSE ELLIPTA) 62.5 MCG/INH AEPB Inhale 1 puff into the lungs daily.    . Albuterol Sulfate (PROAIR RESPICLICK) 108 (90 Base) MCG/ACT AEPB Inhale 1-2 puffs into the lungs daily as needed (for shortness of breath).    . Aspirin Buf,CaCarb-MgCarb-MgO, (BUFFERIN PO) Take 1 tablet by mouth daily as needed (pain).    . Carbamide Peroxide (EAR WAX DROPS OT) Place 1 drop in ear(s) daily as needed (ear wax removal).    . fluconazole (DIFLUCAN) 100 MG tablet Take 1 tablet by mouth daily.    . fluticasone furoate-vilanterol (BREO ELLIPTA) 200-25 MCG/INH AEPB Inhale 1 puff into the lungs daily.    Marland Kitchen nystatin (MYCOSTATIN) 100000 UNIT/ML suspension Use as directed 5 mLs in the mouth or throat 4 (four) times daily.    . polyethylene glycol (MIRALAX / GLYCOLAX) packet Take 17 g by mouth daily as needed for mild constipation.     No current facility-administered medications for this visit.     Allergies as of 06/24/2016 - Review Complete 06/24/2016  Allergen Reaction  Noted  . Chocolate  03/10/2014    Family History  Problem Relation Age of Onset  . Heart failure Mother     Died in her 84s  . Tuberculosis Father     Died at young age  . Cancer Brother   . Arthritis    . Colon cancer      Social History   Social History  . Marital status: Married    Spouse name: N/A  . Number of children: N/A  . Years of education: 60   Occupational History  . Retired   .  Retired   Social History Main Topics  . Smoking status: Never Smoker  . Smokeless tobacco: Never Used  . Alcohol use No  . Drug use: No  . Sexual activity: No   Other Topics Concern  . None   Social History Narrative  .  None    Review of Systems: General: Negative for anorexia, weight loss, fever, chills, fatigue, weakness. ENT: Negative for hoarseness.  CV: Negative for chest pain, angina, palpitations, peripheral edema.  Respiratory: Negative for dyspnea at rest, cough, sputum, wheezing.  GI: See history of present illness. Endo: Negative for unusual weight change.  Heme: Negative for bruising or bleeding.  Physical Exam: BP (!) 182/66   Pulse 67   Temp 97.9 F (36.6 C) (Oral)   Ht 5\' 6"  (1.676 m)   Wt 140 lb 3.2 oz (63.6 kg)   BMI 22.63 kg/m  General:   Alert and oriented. Pleasant and cooperative. Well-nourished and well-developed.  Eyes:  Without icterus, sclera clear and conjunctiva pink.  Ears:  Normal auditory acuity. Cardiovascular:  S1, S2 present without murmurs appreciated. Extremities without clubbing or edema. Respiratory:  Clear to auscultation bilaterally. No wheezes, rales, or rhonchi. No distress.  Gastrointestinal:  +BS, soft, non-tender and non-distended. No HSM noted. No guarding or rebound. No masses appreciated.  Rectal:  Deferred  Musculoskalatal:  Symmetrical without gross deformities. Skin:  Intact without significant lesions or rashes. Neurologic:  Alert and oriented x4;  grossly normal neurologically. Psych:  Alert and cooperative.  Normal mood and affect. Heme/Lymph/Immune: No excessive bruising noted.    06/24/2016 11:22 AM   Disclaimer: This note was dictated with voice recognition software. Similar sounding words can inadvertently be transcribed and may not be corrected upon review.

## 2016-06-24 NOTE — Progress Notes (Signed)
cc'ed to pcp °

## 2016-06-24 NOTE — Assessment & Plan Note (Signed)
The patient complains of constipation, MiraLAX has not worked well for her. At this point I will trial her on Amitiza 8 g twice daily on a full stomach. We will provide samples for 2 weeks and request progress report in 1-2 weeks. No red flag/warning signs or symptoms such as rectal bleeding, melena. Return for follow-up in 3 months.

## 2016-06-28 ENCOUNTER — Telehealth: Payer: Self-pay | Admitting: Internal Medicine

## 2016-06-28 NOTE — Telephone Encounter (Signed)
Routing to the refill box. 

## 2016-06-28 NOTE — Telephone Encounter (Signed)
Pt called to say that she needed a refill on ondansetron and send it to [facility]. She is still having trouble with her stomach and acid reflux. [phone]

## 2016-06-29 ENCOUNTER — Telehealth: Payer: Self-pay | Admitting: Gastroenterology

## 2016-06-29 NOTE — Telephone Encounter (Signed)
PATIENT CALLED, [phone], AND STATED THE PHARMACY HAS NOT GOTTEN THE REFILL PRESCRIPTION FOR HER NAUSEA MEDICATION SHE CALLED ABOUT YESTERDAY

## 2016-06-30 MED ORDER — ONDANSETRON HCL 4 MG/5ML PO SOLN: 4.0000 mg | Freq: Three times a day (TID) | ORAL | 1 refills | Status: DC | PRN

## 2016-06-30 NOTE — Telephone Encounter (Signed)
Forwarding to refill box.  

## 2016-06-30 NOTE — Addendum Note (Signed)
Addended by: Delane GingerGILL, ERIC A on: 06/30/2016 11:57 AM   Modules accepted: Orders

## 2016-06-30 NOTE — Telephone Encounter (Signed)
PT is aware.

## 2016-06-30 NOTE — Telephone Encounter (Signed)
Please tell the patient Rx sent to pharmacy 

## 2016-07-01 ENCOUNTER — Telehealth: Payer: Self-pay | Admitting: Internal Medicine

## 2016-07-01 MED ORDER — LUBIPROSTONE 24 MCG PO CAPS: 24.0000 ug | ORAL_CAPSULE | Freq: Two times a day (BID) | ORAL | 1 refills | Status: DC

## 2016-07-01 NOTE — Telephone Encounter (Signed)
Pt is aware, she said that was fine with her. We are out of samples of amitiza . Pt wants to know if you will send in an rx to her pharmacy?

## 2016-07-01 NOTE — Telephone Encounter (Signed)
[name], you gave her Amitiza 8mcg. Do you want to increase it?

## 2016-07-01 NOTE — Telephone Encounter (Signed)
Done. I only sent in 1-2 months in case it doesn't work. Call us back in a week and let us know if it's helping and we can extend the prescription.

## 2016-07-01 NOTE — Addendum Note (Signed)
Addended by: Delane GingerGILL, ERIC A on: 07/01/2016 04:36 PM   Modules accepted: Orders

## 2016-07-01 NOTE — Telephone Encounter (Signed)
Yes, lets increase to 24 mcg. Thanks

## 2016-07-01 NOTE — Telephone Encounter (Signed)
Pt seen recently by EG. She said that she has tried the samples given to her and it's not working. She said that she is still constipated and wants to speak with the nurse. [phone]

## 2016-07-23 ENCOUNTER — Other Ambulatory Visit: Payer: Self-pay | Admitting: Cardiology

## 2016-07-28 ENCOUNTER — Telehealth: Payer: Self-pay

## 2016-07-28 NOTE — Telephone Encounter (Signed)
Pt called to let us know that the Amitiza is not working anymore. She said that the medication( ondansetron 100 mg) has on the bottle that it will cause constipation. Pt advise

## 2016-08-03 ENCOUNTER — Telehealth: Payer: Self-pay | Admitting: Internal Medicine

## 2016-08-03 NOTE — Telephone Encounter (Signed)
Routing to EG- see other phone note. 

## 2016-08-03 NOTE — Telephone Encounter (Signed)
Pt had called on 07/28/16 about her medication not working and hasn't heard from us. Please advise. Pt is still constipated. [phone]

## 2016-08-05 ENCOUNTER — Encounter: Payer: Self-pay | Admitting: Nurse Practitioner

## 2016-08-05 NOTE — Telephone Encounter (Signed)
Have her pick up samples of Amitiza 24 mcg, also twice a day. Call in 1-2 weeks and let us know if it is working any better. If it doesn't we can try switching to Linzess.

## 2016-08-05 NOTE — Telephone Encounter (Signed)
We do not have samples of the Amitiza 24 mcg. I spoke to Wynne DustEric Gill, NP and he said to try Linzess 72 ncg once daily. I tried to call pt and line was busy.  Samples of the Linzess 72 mcg #8 are at front for pick up.

## 2016-08-06 NOTE — Telephone Encounter (Signed)
PT is aware to come by and pick up the samples and to take pill 30 min before breakfast.

## 2016-08-07 ENCOUNTER — Other Ambulatory Visit: Payer: Self-pay | Admitting: Nurse Practitioner

## 2016-08-10 ENCOUNTER — Ambulatory Visit: Payer: Self-pay | Admitting: Nurse Practitioner

## 2016-08-23 ENCOUNTER — Other Ambulatory Visit: Payer: Self-pay | Admitting: Nurse Practitioner

## 2016-08-23 ENCOUNTER — Other Ambulatory Visit: Payer: Self-pay | Admitting: Cardiology

## 2016-08-25 ENCOUNTER — Ambulatory Visit: Payer: Self-pay | Admitting: Nurse Practitioner

## 2016-09-21 ENCOUNTER — Ambulatory Visit: Payer: Self-pay | Admitting: Nurse Practitioner

## 2016-09-22 ENCOUNTER — Encounter: Payer: Self-pay | Admitting: Nurse Practitioner

## 2016-09-22 ENCOUNTER — Ambulatory Visit (INDEPENDENT_AMBULATORY_CARE_PROVIDER_SITE_OTHER): Payer: Medicare Other | Admitting: Nurse Practitioner

## 2016-09-22 VITALS — BP 168/68 | HR 61 | Temp 98.1°F | Ht 66.0 in | Wt 135.6 lb

## 2016-09-22 DIAGNOSIS — R1314 Dysphagia, pharyngoesophageal phase: Secondary | ICD-10-CM | POA: Diagnosis not present

## 2016-09-22 DIAGNOSIS — K5900 Constipation, unspecified: Secondary | ICD-10-CM | POA: Diagnosis not present

## 2016-09-22 NOTE — Assessment & Plan Note (Signed)
Dysphagia essentially resolved. Her larger pills have been turned capsules that she can take with applesauce. Continue to monitor, notify of any recurrent symptoms. Return for follow-up in 6 months.

## 2016-09-22 NOTE — Progress Notes (Signed)
Referring Provider: Gareth Morgan, MD Primary Care Physician:  Milana Obey, MD Primary GI:  Dr. Jena Gauss  Chief Complaint  Patient presents with  . Dysphagia    HPI:   [name] is a 83 y.o. female who presents for follow-up on constipation and dysphagia. The patient was last seen in our office 06/24/2016 for the same. EGD completed 05/12/2016 with dilation of Schatzki's ring, medium size hiatal hernia, otherwise normal. Continue omeprazole daily. At the time of her last visit she was doing well, swallowing improved, and occasional pill dysphagia which improves with water and no more solid food dysphagia. Weight stable with some increase recently. Was having constipation symptoms with MiraLAX ineffective. She was given Amitiza 8 g twice daily and request a progress report. Return for follow-up in 3 months.  She called 07/01/2016 stating samples not working and her Amitiza dose was increased to 24 g. She was switched to Linzess 72 g daily and samples provided.  Today she states she's doing well. Linzess did not work for her very well. Takes Dulcolax about twice a week which helps her have regular bowel movements. Has a good appetite. Denies abdominal pain, N/V, hematochezia, melena. Nausea improved with Zofran. No more dysphagia. Denies any other upper or lower GI symptoms.  Past Medical History:  Diagnosis Date  . Anxiety   . Atrial fibrillation (HCC)   . COPD (chronic obstructive pulmonary disease) (HCC)   . Depression   . Hyperlipidemia   . Hypertension   . Hypothyroidism   . Mitral regurgitation    Mild  . Palpitations    PAT/EAT/NSVT, normal LVEF  . Type 2 diabetes mellitus (HCC)     Past Surgical History:  Procedure Laterality Date  . 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 hiatal hernia. Antral erosions status post gastric biopsy.  .  ESOPHAGOGASTRODUODENOSCOPY N/A 05/12/2016   Procedure: ESOPHAGOGASTRODUODENOSCOPY (EGD);  Surgeon: Corbin Ade, MD;  Location: [facility];  Service: Endoscopy;  Laterality: N/A;  730   . MALONEY DILATION N/A 04/17/2014   Procedure: [name] DILATION;  Surgeon: Corbin Ade, MD;  Location: [facility];  Service: Endoscopy;  Laterality: N/A;  [name] DILATION N/A 05/12/2016   Procedure: [name] DILATION;  Surgeon: Corbin Ade, MD;  Location: [facility];  Service: Endoscopy;  Laterality: N/A;  . none      Current Outpatient Prescriptions  Medication Sig Dispense Refill  . ALPRAZolam (XANAX) 0.25 MG tablet Take 0.25 mg by mouth daily as needed for anxiety or sleep.     Marland Kitchen amiodarone (PACERONE) 200 MG tablet TAKE 1 TABLET BY MOUTH EVERY DAY 90 tablet 3  . atorvastatin (LIPITOR) 40 MG tablet Take 40 mg by mouth daily at 6 PM.    . bisacodyl (BISACODYL) 5 MG EC tablet Take 5 mg by mouth daily as needed for mild constipation.    . bismuth subsalicylate (PEPTO BISMOL) 262 MG/15ML suspension Take 30 mLs by mouth daily as needed for indigestion.    . cetirizine (ZYRTEC) 10 MG tablet Take 5 mg by mouth daily as needed for allergies or rhinitis.     . cloNIDine (CATAPRES) 0.1 MG tablet TAKE 1 TABLET BY MOUTH EVERY EVENING 30 tablet 6  . COD LIVER OIL PO Take 15 mLs by mouth daily.    Marland Kitchen ELIQUIS 2.5 MG TABS tablet TAKE ONE TABLET BY MOUTH TWICE DAILY 60 tablet 6  .  ferrous sulfate 325 (65 FE) MG tablet Take 325 mg by mouth daily with breakfast.    . furosemide (LASIX) 20 MG tablet TAKE 1 TABLET BY MOUTH EVERY DAY AS NEEDED FOR FLUID 30 tablet 6  . glimepiride (AMARYL) 1 MG tablet Take 1 mg by mouth daily before breakfast.     . guaiFENesin-dextromethorphan (ROBITUSSIN DM) 100-10 MG/5ML syrup Take 5 mLs by mouth every 4 (four) hours as needed for cough (SUGAR FREE).    Marland Kitchen levothyroxine (SYNTHROID, LEVOTHROID) 25 MCG tablet Take 25 mcg by mouth daily before breakfast.    . lisinopril  (PRINIVIL,ZESTRIL) 20 MG tablet TAKE ONE TABLET BY MOUTH TWICE DAILY 180 tablet 3  . metoprolol succinate (TOPROL-XL) 50 MG 24 hr tablet Take 1 tablet (50 mg total) by mouth daily. Take with or immediately following a meal. 90 tablet 3  . omeprazole (PRILOSEC) 20 MG capsule TAKE 1 CAPSULE BY MOUTH EVERY DAY 30 capsule 5  . ondansetron (ZOFRAN) 4 MG/5ML solution TAKE ONE TEASPOONFUL ( ) BY MOUTH EVERY 8 HOURS AS NEEDED FOR NAUSEA OR VOMITING 100 mL 1  . potassium chloride SA (K-DUR,KLOR-CON) 10 MEQ tablet Take 2 tablets (20 mEq total) by mouth daily. 3 tablet 0  . spironolactone (ALDACTONE) 25 MG tablet Take 25 mg by mouth daily.    Marland Kitchen spironolactone (ALDACTONE) 25 MG tablet TAKE 1 TABLET BY MOUTH EVERY DAY 30 tablet 6  . umeclidinium bromide (INCRUSE ELLIPTA) 62.5 MCG/INH AEPB Inhale 1 puff into the lungs daily.     No current facility-administered medications for this visit.     Allergies as of 09/22/2016 - Review Complete 09/22/2016  Allergen Reaction Noted  . Chocolate  03/10/2014    Family History  Problem Relation Age of Onset  . Heart failure Mother     Died in her 57s  . Tuberculosis Father     Died at young age  . Cancer Brother   . Arthritis    . Colon cancer      Social History   Social History  . Marital status: Married    Spouse name: N/A  . Number of children: N/A  . Years of education: 60   Occupational History  . Retired   .  Retired   Social History Main Topics  . Smoking status: Never Smoker  . Smokeless tobacco: Never Used  . Alcohol use No  . Drug use: No  . Sexual activity: No   Other Topics Concern  . None   Social History Narrative  . None    Review of Systems: General: Negative for anorexia, weight loss, fever, chills, fatigue. ENT: Negative for hoarseness, difficulty swallowing. CV: Negative for chest pain, angina, palpitations, peripheral edema.  Respiratory: Negative for dyspnea at rest, cough, sputum, wheezing.  GI: See history of  present illness. Endo: Negative for unusual weight change.  Heme: Negative for bruising or bleeding.   Physical Exam: BP (!) 168/68   Pulse 61   Temp 98.1 F (36.7 C) (Oral)   Ht  (1.676 m)   Wt 135 lb 9.6 oz (61.5 kg)   BMI 21.89 kg/m  General:   Alert and oriented. Pleasant and cooperative. Well-nourished and well-developed.  Ears:  Normal auditory acuity. Cardiovascular:  S1, S2 present without murmurs appreciated. Extremities without clubbing or edema. Respiratory:  Clear to auscultation bilaterally. No wheezes, rales, or rhonchi. No distress.  Gastrointestinal:  +BS, soft, non-tender and non-distended. No HSM noted. No guarding or rebound. No masses appreciated.  Rectal:  Deferred  Musculoskalatal:  Symmetrical without gross deformities. Neurologic:  Alert and oriented x4;  grossly normal neurologically. Psych:  Alert and cooperative. Normal mood and affect. Heme/Lymph/Immune: No excessive bruising noted.    09/22/2016 11:34 AM   Disclaimer: This note was dictated with voice recognition software. Similar sounding words can inadvertently be transcribed and may not be corrected upon review.

## 2016-09-22 NOTE — Assessment & Plan Note (Signed)
Continue chronic constipation, difficult to treat. She is failed Amitiza 8 g, Amitiza 24 g, Linzess 72 g. What seems to generally worked best for her is Dulcolax which she typically takes about twice a week. I have advised her to avoid taking it anymore than twice a week to prevent laxative dependence. She and her daughter verbalized understanding. Otherwise, continue current medications. She has multiple questions related to other non-GI issues but has a primary care appointment coming up within the next week. I have advised her to write down all these questions and concerns to bring to her PCP visit. Return for follow-up in 6 months. Call if any worsening symptoms before then.

## 2016-09-22 NOTE — Progress Notes (Signed)
cc'ed to pcp °

## 2016-09-22 NOTE — Patient Instructions (Signed)
1. Continue taking your current medications. 2. Right down all your questions regarding things like weakness to bring to your primary care doctor office visit which is coming up. 3. You can use a laxative but I would avoid taking it more than about twice a week. 4. Return for follow-up in 6 months.

## 2016-10-07 ENCOUNTER — Encounter (HOSPITAL_COMMUNITY): Payer: Self-pay | Admitting: Emergency Medicine

## 2016-10-07 ENCOUNTER — Emergency Department (HOSPITAL_COMMUNITY): Payer: Medicare Other

## 2016-10-07 ENCOUNTER — Emergency Department (HOSPITAL_COMMUNITY)
Admission: EM | Admit: 2016-10-07 | Discharge: 2016-10-07 | Disposition: A | Discharge status: Home / Self Care | Payer: Medicare Other | Attending: Emergency Medicine | Admitting: Emergency Medicine

## 2016-10-07 DIAGNOSIS — E119 Type 2 diabetes mellitus without complications: Secondary | ICD-10-CM | POA: Insufficient documentation

## 2016-10-07 DIAGNOSIS — Y939 Activity, unspecified: Secondary | ICD-10-CM | POA: Insufficient documentation

## 2016-10-07 DIAGNOSIS — E039 Hypothyroidism, unspecified: Secondary | ICD-10-CM | POA: Insufficient documentation

## 2016-10-07 DIAGNOSIS — W19XXXA Unspecified fall, initial encounter: Secondary | ICD-10-CM

## 2016-10-07 DIAGNOSIS — W010XXA Fall on same level from slipping, tripping and stumbling without subsequent striking against object, initial encounter: Secondary | ICD-10-CM | POA: Insufficient documentation

## 2016-10-07 DIAGNOSIS — Y92009 Unspecified place in unspecified non-institutional (private) residence as the place of occurrence of the external cause: Secondary | ICD-10-CM | POA: Diagnosis not present

## 2016-10-07 DIAGNOSIS — S20212A Contusion of left front wall of thorax, initial encounter: Secondary | ICD-10-CM | POA: Diagnosis not present

## 2016-10-07 DIAGNOSIS — I10 Essential (primary) hypertension: Secondary | ICD-10-CM | POA: Diagnosis not present

## 2016-10-07 DIAGNOSIS — S299XXA Unspecified injury of thorax, initial encounter: Secondary | ICD-10-CM | POA: Diagnosis present

## 2016-10-07 DIAGNOSIS — Y999 Unspecified external cause status: Secondary | ICD-10-CM | POA: Insufficient documentation

## 2016-10-07 MED ORDER — ACETAMINOPHEN 325 MG PO TABS
650.0000 mg | ORAL_TABLET | Freq: Once | ORAL | Status: AC
  Administered 2016-10-07: 650 mg via ORAL
  Filled 2016-10-07: qty 2

## 2016-10-07 MED ORDER — ACETAMINOPHEN 325 MG PO TABS
ORAL_TABLET | ORAL | Status: AC
  Filled 2016-10-07: qty 1

## 2016-10-07 NOTE — ED Notes (Signed)
ED Provider at bedside. 

## 2016-10-07 NOTE — Discharge Instructions (Signed)
Take over the counter tylenol, as directed on packaging, as needed for discomfort.  Apply moist heat or ice to the area(s) of discomfort, for 15 minutes at a time, several times per day for the next few days.  Do not fall asleep on a heating or ice pack.  Call your regular medical doctor today to schedule a follow up appointment within the next 2 days.  Return to the Emergency Department immediately if worsening.

## 2016-10-07 NOTE — ED Triage Notes (Signed)
Patient c/o left flank pain that started on Sunday after falling. Patient denies hitting head, LOC, or dizziness. Denies any urinary pain, nausea, vomiting, or complications with BMs. Patient does report pain with deep breath.

## 2016-10-07 NOTE — ED Notes (Signed)
PT c/o left side pain from fall.

## 2016-10-07 NOTE — ED Provider Notes (Signed)
[facility] Provider Note   CSN: [id number] Arrival date & time: 10/07/16  1006     History   Chief Complaint Chief Complaint  Patient presents with  . Fall    HPI [name] is a 83 y.o. female.  HPI  Pt was seen at 1030.  Per pt and her family, c/o sudden onset and resolution of one episode of slip and fall that occurred 4 days ago. Pt states she got up from a chair and was walking in her bedroom when she slipped and fell onto her left side. Pt was ambulatory directly after and since the fall. Pt c/o continued left sided ribs "pain" since the fall. Denies hitting head, no LOC, no AMS, no prodromal symptoms before fall, no neck or back pain, no extremities pain, no fevers, no rash, no focal motor weakness, no tingling/numbness in extremities, no abd pain, no CP/SOB.     Past Medical History:  Diagnosis Date  . Anxiety   . Atrial fibrillation (HCC)   . COPD (chronic obstructive pulmonary disease) (HCC)   . Depression   . Hyperlipidemia   . Hypertension   . Hypothyroidism   . Mitral regurgitation    Mild  . Palpitations    PAT/EAT/NSVT, normal LVEF  . Type 2 diabetes mellitus [facility]     Patient Active Problem List   Diagnosis Date Noted  . GERD (gastroesophageal reflux disease) 03/17/2016  . Constipation 10/29/2015  . Gastric erosions 02/03/2015  . Sinus bradycardia 09/26/2014  . Dyspnea on exertion 09/18/2014  . Chronic diastolic heart failure (HCC) 09/18/2014  . Dizziness 09/18/2014  . Atrial flutter with rapid ventricular response (HCC)   . Atrial fibrillation with RVR (HCC) 09/09/2014  . Atrial flutter (HCC) 07/13/2014  . Hyponatremia 07/13/2014  . Diabetes (HCC) 07/13/2014  . Hypothyroidism 07/13/2014  . Tachycardia 07/13/2014  . Dysphagia, pharyngoesophageal phase 04/16/2014  . Neurogenic pain of lower extremity 03/15/2012  . Left bundle branch block 03/02/2011  . RENAL INSUFFICIENCY 05/22/2010  . Essential hypertension, benign 10/09/2009    . Mitral regurgitation 10/09/2009  . HYPERLIPIDEMIA-MIXED 02/12/2009  . Palpitations 02/12/2009    Past Surgical History:  Procedure Laterality Date  . 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 hiatal hernia. Antral erosions status post gastric biopsy.  . ESOPHAGOGASTRODUODENOSCOPY N/A 05/12/2016   Procedure: ESOPHAGOGASTRODUODENOSCOPY (EGD);  Surgeon: Corbin Ade, MD;  Location[facility];  Service: Endoscopy;  Laterality: N/A;  730   . MALONEY DILATION N/A 04/17/2014   Procedure: [name] DILATION;  Surgeon: Corbin Ade, MD;  Location: [facility];  Service: Endoscopy;  Laterality: N/A;  [name] DILATION N/A 05/12/2016   Procedure: [name] DILATION;  Surgeon: Corbin Ade, MD;  Location: [facility];  Service: Endoscopy;  Laterality: N/A;  . none      OB History    Gravida Para Term Preterm AB Living   5 5 4 1   5    SAB TAB Ectopic Multiple Live Births                   Home Medications    Prior to Admission medications   Medication Sig Start Date End Date Taking? Authorizing Provider  ALPRAZolam Prudy Feeler) 0.25 MG tablet Take 0.25 mg by mouth daily as needed for anxiety or sleep.     [provider]  amiodarone (PACERONE) 200 MG tablet TAKE 1 TABLET BY MOUTH  EVERY DAY 07/23/16   Jonelle Sidle, MD  atorvastatin (LIPITOR) 40 MG tablet Take 40 mg by mouth daily at 6 PM. 09/27/11   Jonelle Sidle, MD  bisacodyl (BISACODYL) 5 MG EC tablet Take 5 mg by mouth daily as needed for mild constipation.    [provider]  bismuth subsalicylate (PEPTO BISMOL) 262 MG/15ML suspension Take 30 mLs by mouth daily as needed for indigestion.    [provider]  cetirizine (ZYRTEC) 10 MG tablet Take 5 mg by mouth daily as needed for allergies or rhinitis.     [provider]  cloNIDine (CATAPRES) 0.1 MG tablet TAKE 1 TABLET BY MOUTH  EVERY EVENING 06/23/16   Jonelle Sidle, MD  COD LIVER OIL PO Take 15 mLs by mouth daily.    [provider]  ELIQUIS 2.5 MG TABS tablet TAKE ONE TABLET BY MOUTH TWICE DAILY 07/23/16   Jonelle Sidle, MD  ferrous sulfate 325 (65 FE) MG tablet Take 325 mg by mouth daily with breakfast.    [provider]  furosemide (LASIX) 20 MG tablet TAKE 1 TABLET BY MOUTH EVERY DAY AS NEEDED FOR FLUID 06/23/16   Jonelle Sidle, MD  glimepiride (AMARYL) 1 MG tablet Take 1 mg by mouth daily before breakfast.     [provider]  guaiFENesin-dextromethorphan (ROBITUSSIN DM) 100-10 MG/5ML syrup Take 5 mLs by mouth every 4 (four) hours as needed for cough (SUGAR FREE).    [provider]  levothyroxine (SYNTHROID, LEVOTHROID) 25 MCG tablet Take 25 mcg by mouth daily before breakfast.    [provider]  lisinopril (PRINIVIL,ZESTRIL) 20 MG tablet TAKE ONE TABLET BY MOUTH TWICE DAILY 05/25/16   Jonelle Sidle, MD  metoprolol succinate (TOPROL-XL) 50 MG 24 hr tablet Take 1 tablet (50 mg total) by mouth daily. Take with or immediately following a meal. 06/04/16   Jonelle Sidle, MD  omeprazole (PRILOSEC) 20 MG capsule TAKE 1 CAPSULE BY MOUTH EVERY DAY 09/29/15   Tiffany Kocher, PA-C  ondansetron (ZOFRAN) 4 MG/5ML solution TAKE ONE TEASPOONFUL ( ) BY MOUTH EVERY 8 HOURS AS NEEDED FOR NAUSEA OR VOMITING 08/11/16   Tiffany Kocher, PA-C  potassium chloride SA (K-DUR,KLOR-CON) 10 MEQ tablet Take 2 tablets (20 mEq total) by mouth daily. 05/15/16   Bethann Berkshire, MD  spironolactone (ALDACTONE) 25 MG tablet Take 25 mg by mouth daily.    [provider]  spironolactone (ALDACTONE) 25 MG tablet TAKE 1 TABLET BY MOUTH EVERY DAY 08/23/16   Jonelle Sidle, MD  umeclidinium bromide (INCRUSE ELLIPTA) 62.5 MCG/INH AEPB Inhale 1 puff into the lungs daily.    [provider]    Family History Family History  Problem Relation Age of Onset  . Heart failure  Mother        Died in her 76s  . Tuberculosis Father        Died at young age  . Cancer Brother   . Arthritis Unknown   . Colon cancer Unknown     Social History Social History  Substance Use Topics  . Smoking status: Never Smoker  . Smokeless tobacco: Never Used  . Alcohol use No     Allergies   Chocolate   Review of Systems Review of Systems ROS: Statement: All systems negative except as marked or noted in the HPI; Constitutional: Negative for fever and chills. ; ; Eyes: Negative for eye pain, redness and discharge. ; ; ENMT: Negative for ear  pain, hoarseness, nasal congestion, sinus pressure and sore throat. ; ; Cardiovascular: Negative for chest pain, palpitations, diaphoresis, dyspnea and peripheral edema. ; ; Respiratory: Negative for cough, wheezing and stridor. ; ; Gastrointestinal: Negative for nausea, vomiting, diarrhea, abdominal pain, blood in stool, hematemesis, jaundice and rectal bleeding. . ; ; Genitourinary: Negative for dysuria, flank pain and hematuria. ; ; Musculoskeletal: +left ribs pain. Negative for back pain and neck pain. Negative for swelling and deformity..; ; Skin: Negative for pruritus, rash, abrasions, blisters, bruising and skin lesion.; ; Neuro: Negative for headache, lightheadedness and neck stiffness. Negative for weakness, altered level of consciousness, altered mental status, extremity weakness, paresthesias, involuntary movement, seizure and syncope.       Physical Exam Updated Vital Signs BP (!) 180/73 (BP Location: Left Arm)   Pulse 63   Temp 98 F (36.7 C) (Oral)   Resp 18   Ht 5\' 6"  (1.676 m)   Wt 135 lb (61.2 kg)   SpO2 100%   BMI 21.79 kg/m   Physical Exam 1035: Physical examination: Vital signs and O2 SAT: Reviewed; Constitutional: Well developed, Well nourished, Well hydrated, In no acute distress; Head and Face: Normocephalic, Atraumatic; Eyes: EOMI, PERRL, No scleral icterus; ENMT: Mouth and pharynx normal, Mucous membranes  moist; Neck: Supple, Trachea midline; Spine:  No midline CS, TS, LS tenderness.; Cardiovascular: Irregular rate and rhythm, No gallop; Respiratory: Breath sounds clear & equal bilaterally, No wheezes, Normal respiratory effort/excursion; Chest: Nontender, No deformity, Movement normal, No crepitus, No abrasions or ecchymosis.; Abdomen: Soft, Nontender, Nondistended, Normal bowel sounds, No abrasions or ecchymosis.; Genitourinary: No CVA tenderness;  Extremities: No deformity, Pt can lift extended bilat LE's off stretcher without pain. Full range of motion major/large joints of bilat UE's and LE's without pain or tenderness to palp, Neurovascularly intact, Pulses normal, No tenderness, No edema, Pelvis stable; Neuro: AA&Ox3, GCS 15.  +HOH, otherwise major CN grossly intact. Speech clear. No gross focal motor or sensory deficits in extremities.; Skin: Color normal, Warm, Dry    ED Treatments / Results  Labs (all labs ordered are listed, but only abnormal results are displayed)   EKG  EKG Interpretation None       Radiology   Procedures Procedures (including critical care time)  Medications Ordered in ED Medications  acetaminophen (TYLENOL) tablet 650 mg (650 mg Oral Given 10/07/16 1122)     Initial Impression / Assessment and Plan / ED Course  I have reviewed the triage vital signs and the nursing notes.  Pertinent labs & imaging results that were available during my care of the patient were reviewed by me and considered in my medical decision making (see chart for details).  MDM Reviewed: previous chart, nursing note and vitals Interpretation: x-ray    Dg Ribs Unilateral W/chest Left Result Date: 10/07/2016 CLINICAL DATA:  Recent fall, landing on the left side against a bed. Left flank and rib pain. Pain with deep inspiration. Initial encounter. EXAM: LEFT RIBS AND CHEST - 3+ VIEW COMPARISON:  Chest radiographs 06/10/2016 FINDINGS: The cardiac silhouette is upper limits of  normal in size, unchanged. Aortic atherosclerosis is again noted. The lungs remain hyperinflated with chronic interstitial coarsening. No airspace consolidation, edema, pleural effusion, or pneumothorax is identified. No rib fracture or other acute osseous abnormality is identified. Thoracic spondylosis is noted. IMPRESSION: 1. No rib fracture identified. 2. COPD. Electronically Signed   By: Sebastian Ache M.D.   On: 10/07/2016 11:38   Dg Lumbar Spine Complete Result Date: 10/07/2016 CLINICAL  DATA:  Status post fall onto the left side this past Sunday. EXAM: LUMBAR SPINE - COMPLETE 4+ VIEW COMPARISON:  None in PACs FINDINGS: The lumbar vertebral bodies are preserved in height. The disc space heights are reasonably well-maintained. There is multilevel endplate spurring predominantly laterally. There is no spondylolisthesis. There is no significant facet joint hypertrophy. The pedicles and transverse processes are intact where visualized. The observed portions of the sacrum are normal. IMPRESSION: There is no acute bony abnormality of the lumbar spine. No significant disc space narrowing. Mild multilevel endplate spurring is present. Electronically Signed   By: David  SwazilandJordan M.D.   On: 10/07/2016 11:26    1220:  XR reassuring. Abd remains benign, resps easy, NAD. Tx symptomatically at this time. Dx and testing d/w pt and family.  Questions answered.  Verb understanding, agreeable to d/c home with outpt f/u.  Final Clinical Impressions(s) / ED Diagnoses   Final diagnoses:  None    New Prescriptions New Prescriptions   No medications on file     Samuel JesterMcManus, Adalaya Irion, DO 10/12/16 [id number]

## 2016-10-08 NOTE — Progress Notes (Signed)
Cardiology Office Note  Date: 10/11/2016   ID: [name], DOB 04/22/1927, MRN [id number]  PCP: Charlene Morgan, MD  Primary Cardiologist: Charlene Dell, MD   Chief Complaint  Patient presents with  . PAF    History of Present Illness: [name] is an 83 y.o. female last seen in January. She is here today with her daughter for a follow-up visit. Patient was seen just recently in the ER after a fall at home without loss of consciousness. No obvious acute fractures by x-rays of the chest and lumbar spine. Lab work is reviewed below.  She does not report any chest pain or palpitations. Describes chronic leg weakness. She has been using a cane. Still cooks for herself and states that she has a good appetite.  I reviewed her medications. Cardiac regimen includes amiodarone, Lipitor, clonidine, Eliquis, Lasix, lisinopril, Toprol-XL, and Aldactone.  Past Medical History:  Diagnosis Date  . Anxiety   . Atrial fibrillation (HCC)   . COPD (chronic obstructive pulmonary disease) (HCC)   . Depression   . Hyperlipidemia   . Hypertension   . Hypothyroidism   . Mitral regurgitation    Mild  . Palpitations    PAT/EAT/NSVT, normal LVEF  . Type 2 diabetes mellitus (HCC)     Past Surgical History:  Procedure Laterality Date  . 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 hiatal hernia. Antral erosions status post gastric biopsy.  . ESOPHAGOGASTRODUODENOSCOPY N/A 05/12/2016   Procedure: ESOPHAGOGASTRODUODENOSCOPY (EGD);  Surgeon: Corbin Ade, MD;  [facility];  Service: Endoscopy;  Laterality: N/A;  730   . MALONEY DILATION N/A 04/17/2014   Procedure: [name] DILATION;  Surgeon: Corbin Ade, MD;  [facility];  Service: Endoscopy;  Laterality: N/A;  [name] DILATION N/A 05/12/2016   Procedure: [name] DILATION;  Surgeon: Corbin Ade, MD;   [facility];  Service: Endoscopy;  Laterality: N/A;  . none      Current Outpatient Prescriptions  Medication Sig Dispense Refill  . acetaminophen (TYLENOL) 650 MG CR tablet Take 650 mg by mouth as needed for pain.    Marland Kitchen ALPRAZolam (XANAX) 0.25 MG tablet Take 0.25 mg by mouth daily as needed for anxiety or sleep.     Marland Kitchen amiodarone (PACERONE) 200 MG tablet TAKE 1 TABLET BY MOUTH EVERY DAY 90 tablet 3  . aspirin buffered (BUFFERIN) 325 MG TABS tablet Take 325 mg by mouth daily as needed (pain).    Marland Kitchen atorvastatin (LIPITOR) 40 MG tablet Take 40 mg by mouth daily at 6 PM.    . bisacodyl (BISACODYL) 5 MG EC tablet Take 5 mg by mouth daily as needed for mild constipation.    . bismuth subsalicylate (PEPTO BISMOL) 262 MG/15ML suspension Take 30 mLs by mouth daily as needed for indigestion.    . cetirizine (ZYRTEC) 10 MG tablet Take 5-10 mg by mouth daily as needed for allergies or rhinitis.     . cloNIDine (CATAPRES) 0.1 MG tablet TAKE 1 TABLET BY MOUTH EVERY EVENING 30 tablet 6  . COD LIVER OIL PO Take 15 mLs by mouth daily.    Marland Kitchen ELIQUIS 2.5 MG TABS tablet TAKE ONE TABLET BY MOUTH TWICE DAILY 60 tablet 6  . ferrous sulfate 325 (65 FE) MG tablet Take 325 mg by mouth daily with breakfast.    . furosemide (LASIX) 20 MG tablet TAKE 1 TABLET  BY MOUTH EVERY DAY AS NEEDED FOR FLUID 30 tablet 6  . glimepiride (AMARYL) 1 MG tablet Take 1 mg by mouth daily before breakfast.     . guaiFENesin-dextromethorphan (ROBITUSSIN DM) 100-10 MG/5ML syrup Take 5 mLs by mouth every 4 (four) hours as needed for cough (SUGAR FREE).    Marland Kitchen. levothyroxine (SYNTHROID, LEVOTHROID) 25 MCG tablet Take 25 mcg by mouth daily before breakfast.    . lisinopril (PRINIVIL,ZESTRIL) 20 MG tablet TAKE ONE TABLET BY MOUTH TWICE DAILY 180 tablet 3  . metoprolol succinate (TOPROL-XL) 50 MG 24 hr tablet Take 1 tablet (50 mg total) by mouth daily. Take with or immediately following a meal. 90 tablet 3  . omeprazole (PRILOSEC) 20 MG  capsule TAKE 1 CAPSULE BY MOUTH EVERY DAY 30 capsule 5  . ondansetron (ZOFRAN) 4 MG/5ML solution TAKE ONE TEASPOONFUL (5ML) BY MOUTH EVERY 8 HOURS AS NEEDED FOR NAUSEA OR VOMITING 100 mL 1  . potassium chloride SA (K-DUR,KLOR-CON) 10 MEQ tablet Take 2 tablets (20 mEq total) by mouth daily. 3 tablet 0  . spironolactone (ALDACTONE) 25 MG tablet TAKE 1 TABLET BY MOUTH EVERY DAY 30 tablet 6  . umeclidinium bromide (INCRUSE ELLIPTA) 62.5 MCG/INH AEPB Inhale 1 puff into the lungs daily.     No current facility-administered medications for this visit.    Allergies:  Chocolate   Social History: The patient  reports that she has never smoked. She has never used smokeless tobacco. She reports that she does not drink alcohol or use drugs.   ROS:  Please see the history of present illness. Otherwise, complete review of systems is positive for hearing loss.  All other systems are reviewed and negative.   Physical Exam: VS:  BP (!) 160/71   Pulse 61   Ht 5\' 6"  (1.676 m)   Wt 136 lb 12.8 oz (62.1 kg)   SpO2 100%   BMI 22.08 kg/m , BMI Body mass index is 22.08 kg/m.  Wt Readings from Last 3 Encounters:  10/11/16 136 lb 12.8 oz (62.1 kg)  10/07/16 135 lb (61.2 kg)  09/22/16 135 lb 9.6 oz (61.5 kg)    General: Elderly woman, appears comfortable at rest. Using a cane to walk. HEENT: Conjunctiva and lids normal, oropharynx clear. Neck: Supple, no elevated JVP, no thyromegaly. Lungs: Clear to auscultation, nonlabored breathing at rest. Cardiac: Regular rate and rhythm, no S3, paradoxically split S2, soft systolic murmur, no pericardial rub. Abdomen: Soft, nontender, bowel sounds present. Extremities: Chronic appearing lower leg edema, distal pulses 2+. Skin: Warm and dry. Musculoskeletal: No kyphosis. Neuropsychiatric: Alert and oriented 3, affect appropriate.  ECG: I personally reviewed the tracing from 06/10/2016 which showed sinus rhythm with left bundle branch block.  Recent  Labwork: 03/14/2016: Magnesium 1.9 04/22/2016: ALT 30; AST 26; B Natriuretic Peptide 62.0 06/10/2016: BUN 22; Creatinine, Ser 1.79; Hemoglobin 10.6; Platelets 407; Potassium 4.6; Sodium 132     Component Value Date/Time   CHOL 142 07/14/2014 0456   TRIG 32 07/14/2014 0456   HDL 58 07/14/2014 0456   CHOLHDL 2.4 07/14/2014 0456   VLDL 6 07/14/2014 0456   LDLCALC 78 07/14/2014 0456    Other Studies Reviewed Today:  Echocardiogram 07/14/2014: Study Conclusions  - Left ventricle: The cavity size was normal. Systolic function was normal. The estimated ejection fraction was in the range of 50% to 55%. Wall motion was normal; there were no regional wall motion abnormalities. There was a reduced contribution of atrial contraction to ventricular filling, due to  increased ventricular diastolic pressure or atrial contractile dysfunction. Doppler parameters are consistent with a reversible restrictive pattern, indicative of decreased left ventricular diastolic compliance and/or increased left atrial pressure (grade 3 diastolic dysfunction). - Ventricular septum: Septal motion showed paradox. These changes are consistent with intraventricular conduction delay. - Aortic valve: Trileaflet; normal thickness, mildly calcified leaflets. - Mitral valve: There was moderate regurgitation. - Left atrium: The atrium was mildly to moderately dilated. - Tricuspid valve: There was moderate regurgitation. - Pulmonary arteries: PA peak pressure: 34 mm Hg (S).  Assessment and Plan:  1. Paroxysmal atrial fibrillation. We will continue current treatment with Toprol-XL, amiodarone, and Eliquis. Recent lab work reviewed. She has had no progressive palpitations.  2. Essential hypertension on regimen outlined above. Blood pressure is "up and down," control complicated by anxiety as well.  3. Moderate mitral regurgitation by echocardiogram 2016. This is being followed  conservatively.  Current medicines were reviewed with the patient today.  Disposition: Follow-up in 4 months.  Signed, Jonelle Sidle, MD, [facility] 10/11/2016 11:16 AM    [facility] [address] [place], [place], [place] [address] Phone: [phone]; Fax: [phone]

## 2016-10-11 ENCOUNTER — Ambulatory Visit (INDEPENDENT_AMBULATORY_CARE_PROVIDER_SITE_OTHER): Payer: Medicare Other | Admitting: Cardiology

## 2016-10-11 ENCOUNTER — Encounter: Payer: Self-pay | Admitting: Cardiology

## 2016-10-11 VITALS — BP 160/71 | HR 61 | Ht 66.0 in | Wt 136.8 lb

## 2016-10-11 DIAGNOSIS — I10 Essential (primary) hypertension: Secondary | ICD-10-CM

## 2016-10-11 DIAGNOSIS — I480 Paroxysmal atrial fibrillation: Secondary | ICD-10-CM | POA: Diagnosis not present

## 2016-10-11 DIAGNOSIS — I340 Nonrheumatic mitral (valve) insufficiency: Secondary | ICD-10-CM

## 2016-10-11 NOTE — Patient Instructions (Signed)

## 2016-12-07 ENCOUNTER — Other Ambulatory Visit (HOSPITAL_COMMUNITY): Payer: Self-pay | Admitting: Family Medicine

## 2016-12-07 DIAGNOSIS — R634 Abnormal weight loss: Secondary | ICD-10-CM

## 2016-12-08 ENCOUNTER — Encounter: Payer: Self-pay | Admitting: Cardiology

## 2016-12-14 ENCOUNTER — Ambulatory Visit (HOSPITAL_COMMUNITY): Payer: Medicare Other

## 2017-01-13 ENCOUNTER — Other Ambulatory Visit: Payer: Self-pay | Admitting: Cardiology

## 2017-01-28 ENCOUNTER — Telehealth: Payer: Self-pay | Admitting: Cardiology

## 2017-01-28 NOTE — Telephone Encounter (Signed)
Patient notified there would be no need to hold blood thinner for CT scan. Patient verbalized understanding.

## 2017-01-28 NOTE — Telephone Encounter (Signed)
[name] called stating that she is having a CT 02-02-17 at [facility]. She wants to know if she is suppose to come off of Her blood thinner.    [phone].

## 2017-02-02 ENCOUNTER — Ambulatory Visit (HOSPITAL_COMMUNITY)
Admission: RE | Admit: 2017-02-02 | Discharge: 2017-02-02 | Disposition: A | Discharge status: Home / Self Care | Payer: Medicare Other | Source: Ambulatory Visit | Attending: Family Medicine | Admitting: Family Medicine

## 2017-02-02 DIAGNOSIS — R634 Abnormal weight loss: Secondary | ICD-10-CM | POA: Insufficient documentation

## 2017-02-02 DIAGNOSIS — R932 Abnormal findings on diagnostic imaging of liver and biliary tract: Secondary | ICD-10-CM | POA: Insufficient documentation

## 2017-02-02 DIAGNOSIS — R918 Other nonspecific abnormal finding of lung field: Secondary | ICD-10-CM | POA: Diagnosis not present

## 2017-02-02 DIAGNOSIS — E2749 Other adrenocortical insufficiency: Secondary | ICD-10-CM | POA: Diagnosis not present

## 2017-02-14 ENCOUNTER — Ambulatory Visit (INDEPENDENT_AMBULATORY_CARE_PROVIDER_SITE_OTHER): Payer: Medicare Other | Admitting: Cardiology

## 2017-02-14 ENCOUNTER — Encounter: Payer: Self-pay | Admitting: Cardiology

## 2017-02-14 ENCOUNTER — Other Ambulatory Visit: Payer: Self-pay | Admitting: Cardiology

## 2017-02-14 VITALS — BP 150/70 | HR 68 | Ht 66.0 in | Wt 129.0 lb

## 2017-02-14 DIAGNOSIS — I480 Paroxysmal atrial fibrillation: Secondary | ICD-10-CM

## 2017-02-14 DIAGNOSIS — I340 Nonrheumatic mitral (valve) insufficiency: Secondary | ICD-10-CM

## 2017-02-14 DIAGNOSIS — I10 Essential (primary) hypertension: Secondary | ICD-10-CM

## 2017-02-14 NOTE — Progress Notes (Signed)
Cardiology Office Note  Date: 02/14/2017   ID: [name], DOB 11-14-1926, MRN [id number]  PCP: Gareth Morgan, MD  Primary Cardiologist: Nona Dell, MD   Chief Complaint  Patient presents with  . PAF    History of Present Illness: [name] is an 83 y.o. female last seen in May. She is here today with her granddaughter. Recently she has been undergoing evaluation for weight loss by Dr. Sudie Bailey. She reportedly had lab work done (results being requested) and also an abdominal and pelvic CT scan. She is waiting to hear on the results.  From a cardiac perspective she does not report any palpitations or chest pain. She is chronically fatigued, also unsteady on her feet. She has been using a walker recently.  I reviewed her cardiac medications which are outlined below and stable. She reports no bleeding problems on Eliquis.  Past Medical History:  Diagnosis Date  . Anxiety   . Atrial fibrillation (HCC)   . COPD (chronic obstructive pulmonary disease) (HCC)   . Depression   . Hyperlipidemia   . Hypertension   . Hypothyroidism   . Mitral regurgitation    Mild  . Palpitations    PAT/EAT/NSVT, normal LVEF  . Type 2 diabetes mellitus (HCC)     Past Surgical History:  Procedure Laterality Date  . 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 hiatal hernia. Antral erosions status post gastric biopsy.  . ESOPHAGOGASTRODUODENOSCOPY N/A 05/12/2016   Procedure: ESOPHAGOGASTRODUODENOSCOPY (EGD);  Surgeon: Corbin Ade, MD;  [facility];  Service: Endoscopy;  Laterality: N/A;  730   . MALONEY DILATION N/A 04/17/2014   Procedure: [name] DILATION;  Surgeon: Corbin Ade, MD;  [facility];  Service: Endoscopy;  Laterality: N/A;  [name] DILATION N/A 05/12/2016   Procedure: [name] DILATION;  Surgeon: Corbin Ade, MD;  [facility];  Service: Endoscopy;  Laterality: N/A;  . none      Current Outpatient Prescriptions  Medication Sig Dispense Refill  . acetaminophen (TYLENOL) 650 MG CR tablet Take 650 mg by mouth as needed for pain.    Marland Kitchen ALPRAZolam (XANAX) 0.25 MG tablet Take 0.25 mg by mouth daily as needed for anxiety or sleep.     Marland Kitchen amiodarone (PACERONE) 200 MG tablet TAKE 1 TABLET BY MOUTH EVERY DAY 90 tablet 3  . amoxicillin (AMOXIL) 500 MG capsule Take 500 mg by mouth 3 (three) times daily.    Marland Kitchen aspirin buffered (BUFFERIN) 325 MG TABS tablet Take 325 mg by mouth daily as needed (pain).    Marland Kitchen atorvastatin (LIPITOR) 40 MG tablet Take 40 mg by mouth daily at 6 PM.    . bisacodyl (BISACODYL) 5 MG EC tablet Take 5 mg by mouth daily as needed for mild constipation.    . bismuth subsalicylate (PEPTO BISMOL) 262 MG/15ML suspension Take 30 mLs by mouth daily as needed for indigestion.    . cetirizine (ZYRTEC) 10 MG tablet Take 5-10 mg by mouth daily as needed for allergies or rhinitis.     . cloNIDine (CATAPRES) 0.1 MG tablet TAKE ONE TABLET BY MOUTH EVERY EVENING 30 tablet 6  . COD LIVER OIL PO Take 15 mLs by mouth daily.    Marland Kitchen ELIQUIS 2.5 MG TABS tablet TAKE ONE TABLET BY MOUTH TWICE DAILY 60 tablet 3  . ferrous sulfate 325 (65 FE) MG tablet Take 325 mg  by mouth daily with breakfast.    . furosemide (LASIX) 20 MG tablet TAKE 1 TABLET BY MOUTH EVERY DAY AS NEEDED FOR FLUID 30 tablet 6  . glimepiride (AMARYL) 1 MG tablet Take 1 mg by mouth daily before breakfast.     . guaiFENesin-dextromethorphan (ROBITUSSIN DM) 100-10 MG/5ML syrup Take 5 mLs by mouth every 4 (four) hours as needed for cough (SUGAR FREE).    Marland Kitchen levothyroxine (SYNTHROID, LEVOTHROID) 25 MCG tablet Take 25 mcg by mouth daily before breakfast.    . lisinopril (PRINIVIL,ZESTRIL) 20 MG tablet TAKE ONE TABLET BY MOUTH TWICE DAILY 180 tablet 3  . metoprolol succinate (TOPROL-XL) 50 MG 24 hr tablet Take 1 tablet (50 mg total) by mouth daily. Take with or  immediately following a meal. 90 tablet 3  . omeprazole (PRILOSEC) 20 MG capsule TAKE 1 CAPSULE BY MOUTH EVERY DAY 30 capsule 5  . ondansetron (ZOFRAN) 4 MG/5ML solution TAKE ONE TEASPOONFUL ( ) BY MOUTH EVERY 8 HOURS AS NEEDED FOR NAUSEA OR VOMITING 100 mL 1  . potassium chloride SA (K-DUR,KLOR-CON) 10 MEQ tablet Take 2 tablets (20 mEq total) by mouth daily. 3 tablet 0  . spironolactone (ALDACTONE) 25 MG tablet TAKE 1 TABLET BY MOUTH EVERY DAY 30 tablet 6  . umeclidinium bromide (INCRUSE ELLIPTA) 62.5 MCG/INH AEPB Inhale 1 puff into the lungs daily.     No current facility-administered medications for this visit.    Allergies:  Chocolate   Social History: The patient  reports that she has never smoked. She has never used smokeless tobacco. She reports that she does not drink alcohol or use drugs.   ROS:  Please see the history of present illness. Otherwise, complete review of systems is positive for hearing loss, leg weakness.  All other systems are reviewed and negative.   Physical Exam: VS:  BP (!) 150/70   Pulse 68   Ht  (1.676 m)   Wt 129 lb (58.5 kg)   SpO2 98%   BMI 20.82 kg/m , BMI Body mass index is 20.82 kg/m.  Wt Readings from Last 3 Encounters:  02/14/17 129 lb (58.5 kg)  10/11/16 136 lb 12.8 oz (62.1 kg)  10/07/16 135 lb (61.2 kg)    General: Elderly woman, appears comfortable at rest. Using a cane. HEENT: Conjunctiva and lids normal, oropharynx clear. Neck: Supple, no elevated JVP or carotid bruits, no thyromegaly. Lungs: Clear to auscultation, nonlabored breathing at rest. Cardiac: Regular rate and rhythm, no S3, paradoxically split S2, soft systolic murmur, no pericardial rub. Abdomen: Soft, nontender, bowel sounds present. Extremities: Chronic appearing lower leg edema, distal pulses 2+. Skin: Warm and dry. Musculoskeletal: No kyphosis. Neuropsychiatric: Alert and oriented x3, affect grossly appropriate.  ECG: I personally reviewed the tracing from  06/10/2016 which showed sinus rhythm with left bundle branch block.   Recent Labwork: 03/14/2016: Magnesium 1.9 04/22/2016: ALT 30; AST 26; B Natriuretic Peptide 62.0 06/10/2016: BUN 22; Creatinine, Ser 1.79; Hemoglobin 10.6; Platelets 407; Potassium 4.6; Sodium 132     Component Value Date/Time   CHOL 142 07/14/2014 0456   TRIG 32 07/14/2014 0456   HDL 58 07/14/2014 0456   CHOLHDL 2.4 07/14/2014 0456   VLDL 6 07/14/2014 0456   LDLCALC 78 07/14/2014 0456    Other Studies Reviewed Today:  Echocardiogram 07/14/2014: Study Conclusions  - Left ventricle: The cavity size was normal. Systolic function was normal. The estimated ejection fraction was in the range of 50% to 55%. Wall motion was normal; there were  no regional wall motion abnormalities. There was a reduced contribution of atrial contraction to ventricular filling, due to increased ventricular diastolic pressure or atrial contractile dysfunction. Doppler parameters are consistent with a reversible restrictive pattern, indicative of decreased left ventricular diastolic compliance and/or increased left atrial pressure (grade 3 diastolic dysfunction). - Ventricular septum: Septal motion showed paradox. These changes are consistent with intraventricular conduction delay. - Aortic valve: Trileaflet; normal thickness, mildly calcified leaflets. - Mitral valve: There was moderate regurgitation. - Left atrium: The atrium was mildly to moderately dilated. - Tricuspid valve: There was moderate regurgitation. - Pulmonary arteries: PA peak pressure: 34 mm Hg (S).  Assessment and Plan:  1. Paroxysmal atrial fibrillation. Patient reports no palpitations and heart rate is regular today. She has done well on Toprol-XL, amiodarone, and Eliquis. Requesting most recent lab work from Dr. Sudie Bailey.  2. Essential hypertension, no changes made present regimen.  3. Moderate mitral regurgitation by previous  echocardiogram. Murmur is stable on examination, relatively soft. Continue with conservative follow-up.  Current medicines were reviewed with the patient today.  Disposition: Follow-up in 6 months.  Signed, Jonelle Sidle, MD, [facility] 02/14/2017 11:02 AM    [facility] at [facility] [address] [place], [place], [place] [address] Phone: [phone]; Fax: [phone]

## 2017-02-14 NOTE — Patient Instructions (Signed)

## 2017-03-15 ENCOUNTER — Other Ambulatory Visit: Payer: Self-pay | Admitting: Cardiology

## 2017-03-29 ENCOUNTER — Ambulatory Visit: Payer: Medicare Other | Admitting: Nurse Practitioner

## 2017-03-29 ENCOUNTER — Encounter: Payer: Self-pay | Admitting: Nurse Practitioner

## 2017-03-29 VITALS — BP 156/62 | HR 58 | Temp 98.0°F | Ht 66.0 in | Wt 124.8 lb

## 2017-03-29 DIAGNOSIS — K219 Gastro-esophageal reflux disease without esophagitis: Secondary | ICD-10-CM

## 2017-03-29 DIAGNOSIS — K5900 Constipation, unspecified: Secondary | ICD-10-CM

## 2017-03-29 DIAGNOSIS — R1314 Dysphagia, pharyngoesophageal phase: Secondary | ICD-10-CM

## 2017-03-29 NOTE — Patient Instructions (Signed)
1. Continue taking your current medications. 2. Return for follow-up in 1 year. 3. Call us if you have any questions or concerns.

## 2017-03-29 NOTE — Assessment & Plan Note (Signed)
Patient has chronic constipation and we have tried multiple medications without good result.  She is currently taking a women's laxative which she feels works well and she is satisfied with the results.  Continue all current medications, return for follow-up in 1 year.

## 2017-03-29 NOTE — Progress Notes (Signed)
cc'd to pcp 

## 2017-03-29 NOTE — Assessment & Plan Note (Signed)
Current symptoms currently well managed on PPI.  No significant breakthrough symptoms.  Recommend she continue her PPI.  Return for follow-up in 1 year or sooner if needed.

## 2017-03-29 NOTE — Progress Notes (Signed)
Referring Provider: Gareth MorganKnowlton, Steve, MD Primary Care Physician:  Gareth MorganKnowlton, Steve, MD Primary GI:  Dr. Jena Gaussourk  Chief Complaint  Patient presents with  . Constipation    HPI:   [name] is a 83 y.o. female who presents for follow-up on dysphagia and constipation.  The patient was last seen in our office 09/22/2016 for the same.  She did not have good constipation result with Amitiza 24 mcg or Linzess 72 mcg.  She was taking Dulcolax about twice a week which was helping.  No other GI symptoms.  History of Schatzki's ring with last EGD completed 05/12/2016 with dilation of Schatzki's ring, medium size hiatal hernia, otherwise normal.  Recommended continue omeprazole daily.  Overall her dysphagia has been essentially resolved.  Recommended continue current medications, return for follow-up in 6 months.  Today she is accompanied by her son.  They state she's doing ok overall. Has cerumen impaction and has an appointment with PCP to clean her ears out. Is seeing pulmonary (Dr. Juanetta GoslingHawkins) next week for her breathing. No further dysphagia symptoms. No significant breakthrough GERD symptoms. Constipation is improved with Women's laxative (Miralax, Amitiza, and Linzess did not help). She is satisfied with her constipation symptoms at this time. Denies hematochezia, melena, abdominal pain, N/V. Denies chest pain, dizziness, lightheadedness, syncope, near syncope. Denies any other upper or lower GI symptoms.  NOTE: Patient PMH and PSH incomplete in this note due to computer issues. See history tab for complete information. History tab information was reviewed with the patient and found to be correct.  Past Medical History:  Diagnosis Date  . Anxiety   . Atrial fibrillation (HCC)   . COPD (chronic obstructive pulmonary disease) (HCC)   . Depression   . Hyperlipidemia   . Hypertension   . Hypothyroidism   . Mitral regurgitation    Mild  . Palpitations    PAT/EAT/NSVT, normal LVEF  . Type 2  diabetes mellitus (HCC)     Past Surgical History:  Procedure Laterality Date  . none      Current Outpatient Medications  Medication Sig Dispense Refill  . acetaminophen (TYLENOL) 650 MG CR tablet Take 650 mg by mouth as needed for pain.    Marland Kitchen. ALPRAZolam (XANAX) 0.25 MG tablet Take 0.25 mg by mouth daily as needed for anxiety or sleep.     Marland Kitchen. amiodarone (PACERONE) 200 MG tablet TAKE 1 TABLET BY MOUTH EVERY DAY 90 tablet 3  . aspirin buffered (BUFFERIN) 325 MG TABS tablet Take 325 mg by mouth daily as needed (pain).    Marland Kitchen. atorvastatin (LIPITOR) 40 MG tablet Take 40 mg by mouth daily at 6 PM.    . bismuth subsalicylate (PEPTO BISMOL) 262 MG/15ML suspension Take 30 mLs by mouth daily as needed for indigestion.    . cetirizine (ZYRTEC) 10 MG tablet Take 5-10 mg by mouth daily as needed for allergies or rhinitis.     . cloNIDine (CATAPRES) 0.1 MG tablet TAKE ONE TABLET BY MOUTH EVERY EVENING 30 tablet 6  . COD LIVER OIL PO Take 15 mLs by mouth daily.    Marland Kitchen. ELIQUIS 2.5 MG TABS tablet TAKE ONE TABLET BY MOUTH TWICE DAILY 60 tablet 3  . ferrous sulfate 325 (65 FE) MG tablet Take 325 mg by mouth daily with breakfast.    . furosemide (LASIX) 20 MG tablet TAKE 1 TABLET BY MOUTH EVERY DAY AS NEEDED FOR FLUID 30 tablet 6  . glimepiride (AMARYL) 1 MG tablet Take 1 mg by  mouth daily before breakfast.     . guaiFENesin-dextromethorphan (ROBITUSSIN DM) 100-10 MG/5ML syrup Take 5 mLs by mouth every 4 (four) hours as needed for cough (SUGAR FREE).    Marland Kitchen levothyroxine (SYNTHROID, LEVOTHROID) 25 MCG tablet Take 25 mcg by mouth daily before breakfast.    . lisinopril (PRINIVIL,ZESTRIL) 20 MG tablet TAKE ONE TABLET BY MOUTH TWICE DAILY 180 tablet 3  . metoprolol succinate (TOPROL-XL) 50 MG 24 hr tablet Take 1 tablet (50 mg total) by mouth daily. Take with or immediately following a meal. 90 tablet 3  . omeprazole (PRILOSEC) 20 MG capsule TAKE 1 CAPSULE BY MOUTH EVERY DAY 30 capsule 5  . polyethylene glycol (MIRALAX  / GLYCOLAX) packet Take 17 g 2 (two) times a week by mouth.    . potassium chloride SA (K-DUR,KLOR-CON) 10 MEQ tablet Take 2 tablets (20 mEq total) by mouth daily. 3 tablet 0  . spironolactone (ALDACTONE) 25 MG tablet TAKE ONE TABLET BY MOUTH EVERY MORNING 30 tablet 6  . umeclidinium bromide (INCRUSE ELLIPTA) 62.5 MCG/INH AEPB Inhale 1 puff into the lungs daily.     No current facility-administered medications for this visit.     Allergies as of 03/29/2017 - Review Complete 03/29/2017  Allergen Reaction Noted  . Chocolate  03/10/2014    Family History  Problem Relation Age of Onset  . Heart failure Mother        Died in her 30s  . Tuberculosis Father        Died at young age  . Cancer Brother   . Arthritis Unknown   . Colon cancer Unknown     Social History   Socioeconomic History  . Marital status: Married    Spouse name: None  . Number of children: None  . Years of education: 12  . Highest education level: None  Social Needs  . Financial resource strain: None  . Food insecurity - worry: None  . Food insecurity - inability: None  . Transportation needs - medical: None  . Transportation needs - non-medical: None  Occupational History  . Occupation: Retired    Associate Professor: RETIRED  Tobacco Use  . Smoking status: Never Smoker  . Smokeless tobacco: Never Used  Substance and Sexual Activity  . Alcohol use: No    Alcohol/week: 0.0 oz  . Drug use: No  . Sexual activity: No  Other Topics Concern  . None  Social History Narrative  . None    Review of Systems: General: Negative for anorexia, weight loss, fever, chills, fatigue, weakness. ENT: Negative for hoarseness, difficulty swallowing. CV: Negative for chest pain, angina, palpitations, peripheral edema.  Respiratory: Negative for dyspnea at rest, cough, sputum, wheezing.  GI: See history of present illness. Endo: Negative for unusual weight change.  Heme: Negative for bruising or bleeding.   Physical Exam: BP  (!) 156/62   Pulse (!) 58   Temp 98 F (36.7 C) (Oral)   Ht 5\' 6"  (1.676 m)   Wt 124 lb 12.8 oz (56.6 kg)   BMI 20.14 kg/m  General:   Alert and oriented. Pleasant and cooperative. Well-nourished and well-developed.  Head:  Normocephalic and atraumatic. Eyes:  Without icterus, sclera clear and conjunctiva pink.  Ears:  Hard of hearing. Mouth:  No deformity or lesions, oral mucosa pink.  Throat/Neck:  Supple, without mass or thyromegaly. Cardiovascular:  S1, S2 present without murmurs appreciated. Respiratory:  Clear to auscultation bilaterally. No wheezes, rales, or rhonchi. No distress.  Gastrointestinal:  +BS, soft,  non-tender and non-distended. No HSM noted. No guarding or rebound. No masses appreciated.  Rectal:  Deferred  Musculoskalatal:  Symmetrical without gross deformities. Neurologic:  Alert and oriented x4;  grossly normal neurologically. Psych:  Alert and cooperative. Normal mood and affect. Heme/Lymph/Immune: No excessive bruising noted.    03/29/2017 11:58 AM   Disclaimer: This note was dictated with voice recognition software. Similar sounding words can inadvertently be transcribed and may not be corrected upon review.

## 2017-03-29 NOTE — Assessment & Plan Note (Signed)
Dysphasia symptoms essentially remain resolved.  She states "I can feel food when I swallow" but denies actual dysphagia or regurgitation.  She still takes large pills crushed in applesauce.  I told her this is okay, as long as her prescription medicine bottle and/or the pharmacist do not specifically tell her not to crush it.  She verbalized understanding, as did her son.  Continue medications, specifically PPI for GERD to help prevent recurrent dysphasia.  Return for follow-up in 1 year.

## 2017-04-12 ENCOUNTER — Other Ambulatory Visit (HOSPITAL_COMMUNITY): Payer: Self-pay | Admitting: Family Medicine

## 2017-04-12 DIAGNOSIS — Z8709 Personal history of other diseases of the respiratory system: Secondary | ICD-10-CM

## 2017-04-12 DIAGNOSIS — R9389 Abnormal findings on diagnostic imaging of other specified body structures: Secondary | ICD-10-CM

## 2017-04-19 ENCOUNTER — Ambulatory Visit (HOSPITAL_COMMUNITY)
Admission: RE | Admit: 2017-04-19 | Discharge: 2017-04-19 | Disposition: A | Discharge status: Home / Self Care | Payer: Medicare Other | Source: Ambulatory Visit | Attending: Family Medicine | Admitting: Family Medicine

## 2017-04-19 DIAGNOSIS — Z8709 Personal history of other diseases of the respiratory system: Secondary | ICD-10-CM | POA: Diagnosis present

## 2017-04-19 DIAGNOSIS — R9389 Abnormal findings on diagnostic imaging of other specified body structures: Secondary | ICD-10-CM | POA: Diagnosis not present

## 2017-04-29 ENCOUNTER — Encounter (HOSPITAL_COMMUNITY): Payer: Self-pay

## 2017-04-29 ENCOUNTER — Encounter (HOSPITAL_COMMUNITY): Payer: Medicare Other | Attending: Oncology | Admitting: Oncology

## 2017-04-29 ENCOUNTER — Encounter (HOSPITAL_COMMUNITY): Payer: Medicare Other

## 2017-04-29 VITALS — BP 169/58 | HR 62 | Temp 97.6°F | Resp 18 | Ht 66.0 in | Wt 128.0 lb

## 2017-04-29 DIAGNOSIS — D631 Anemia in chronic kidney disease: Secondary | ICD-10-CM

## 2017-04-29 DIAGNOSIS — Z9889 Other specified postprocedural states: Secondary | ICD-10-CM | POA: Diagnosis not present

## 2017-04-29 DIAGNOSIS — F419 Anxiety disorder, unspecified: Secondary | ICD-10-CM | POA: Insufficient documentation

## 2017-04-29 DIAGNOSIS — Z7982 Long term (current) use of aspirin: Secondary | ICD-10-CM | POA: Insufficient documentation

## 2017-04-29 DIAGNOSIS — I4891 Unspecified atrial fibrillation: Secondary | ICD-10-CM | POA: Insufficient documentation

## 2017-04-29 DIAGNOSIS — E785 Hyperlipidemia, unspecified: Secondary | ICD-10-CM | POA: Insufficient documentation

## 2017-04-29 DIAGNOSIS — Z7984 Long term (current) use of oral hypoglycemic drugs: Secondary | ICD-10-CM | POA: Insufficient documentation

## 2017-04-29 DIAGNOSIS — N189 Chronic kidney disease, unspecified: Secondary | ICD-10-CM

## 2017-04-29 DIAGNOSIS — I340 Nonrheumatic mitral (valve) insufficiency: Secondary | ICD-10-CM | POA: Diagnosis not present

## 2017-04-29 DIAGNOSIS — Z8249 Family history of ischemic heart disease and other diseases of the circulatory system: Secondary | ICD-10-CM | POA: Diagnosis not present

## 2017-04-29 DIAGNOSIS — E119 Type 2 diabetes mellitus without complications: Secondary | ICD-10-CM | POA: Diagnosis not present

## 2017-04-29 DIAGNOSIS — J449 Chronic obstructive pulmonary disease, unspecified: Secondary | ICD-10-CM | POA: Insufficient documentation

## 2017-04-29 DIAGNOSIS — Z809 Family history of malignant neoplasm, unspecified: Secondary | ICD-10-CM | POA: Insufficient documentation

## 2017-04-29 DIAGNOSIS — Z825 Family history of asthma and other chronic lower respiratory diseases: Secondary | ICD-10-CM | POA: Diagnosis not present

## 2017-04-29 DIAGNOSIS — E039 Hypothyroidism, unspecified: Secondary | ICD-10-CM | POA: Diagnosis not present

## 2017-04-29 DIAGNOSIS — Z79899 Other long term (current) drug therapy: Secondary | ICD-10-CM | POA: Insufficient documentation

## 2017-04-29 DIAGNOSIS — F329 Major depressive disorder, single episode, unspecified: Secondary | ICD-10-CM | POA: Diagnosis not present

## 2017-04-29 DIAGNOSIS — R634 Abnormal weight loss: Secondary | ICD-10-CM | POA: Diagnosis not present

## 2017-04-29 DIAGNOSIS — Z791 Long term (current) use of non-steroidal anti-inflammatories (NSAID): Secondary | ICD-10-CM | POA: Insufficient documentation

## 2017-04-29 DIAGNOSIS — I129 Hypertensive chronic kidney disease with stage 1 through stage 4 chronic kidney disease, or unspecified chronic kidney disease: Secondary | ICD-10-CM | POA: Diagnosis not present

## 2017-04-29 LAB — CBC WITH DIFFERENTIAL/PLATELET
BASOS ABS: 0 10*3/uL (ref 0.0–0.1)
Basophils Relative: 0 %
Eosinophils Absolute: 0 10*3/uL (ref 0.0–0.7)
Eosinophils Relative: 1 %
HCT: 27.8 % — ABNORMAL LOW (ref 36.0–46.0)
HEMOGLOBIN: 8.7 g/dL — AB (ref 12.0–15.0)
LYMPHS ABS: 1.1 10*3/uL (ref 0.7–4.0)
LYMPHS PCT: 30 %
MCH: 28.3 pg (ref 26.0–34.0)
MCHC: 31.3 g/dL (ref 30.0–36.0)
MCV: 90.6 fL (ref 78.0–100.0)
Monocytes Absolute: 0.2 10*3/uL (ref 0.1–1.0)
Monocytes Relative: 7 %
NEUTROS ABS: 2.3 10*3/uL (ref 1.7–7.7)
NEUTROS PCT: 62 %
PLATELETS: 242 10*3/uL (ref 150–400)
RBC: 3.07 MIL/uL — AB (ref 3.87–5.11)
RDW: 16.4 % — ABNORMAL HIGH (ref 11.5–15.5)
WBC: 3.6 10*3/uL — AB (ref 4.0–10.5)

## 2017-04-29 LAB — FOLATE: FOLATE: 10.5 ng/mL (ref >5.9)

## 2017-04-29 LAB — COMPREHENSIVE METABOLIC PANEL
ALK PHOS: 68 U/L (ref 38–126)
ALT: 54 U/L (ref 14–54)
AST: 45 U/L — AB (ref 15–41)
Albumin: 3.7 g/dL (ref 3.5–5.0)
Anion gap: 7 (ref 5–15)
BUN: 22 mg/dL — AB (ref 6–20)
CALCIUM: 8.9 mg/dL (ref 8.9–10.3)
CHLORIDE: 107 mmol/L (ref 101–111)
CO2: 22 mmol/L (ref 22–32)
CREATININE: 1.18 mg/dL — AB (ref 0.44–1.00)
GFR, EST AFRICAN AMERICAN: 46 mL/min — AB (ref >60)
GFR, EST NON AFRICAN AMERICAN: 40 mL/min — AB (ref >60)
Glucose, Bld: 87 mg/dL (ref 65–99)
Potassium: 3.7 mmol/L (ref 3.5–5.1)
SODIUM: 136 mmol/L (ref 135–145)
Total Bilirubin: 0.7 mg/dL (ref 0.3–1.2)
Total Protein: 6.8 g/dL (ref 6.5–8.1)

## 2017-04-29 LAB — RETICULOCYTES
RBC.: 3.07 MIL/uL — ABNORMAL LOW (ref 3.87–5.11)
Retic Count, Absolute: 33.8 10*3/uL (ref 19.0–186.0)
Retic Ct Pct: 1.1 % (ref 0.4–3.1)

## 2017-04-29 LAB — LACTATE DEHYDROGENASE: LDH: 215 U/L — AB (ref 98–192)

## 2017-04-29 LAB — IRON AND TIBC
Iron: 30 ug/dL (ref 28–170)
Saturation Ratios: 7 % — ABNORMAL LOW (ref 10.4–31.8)
TIBC: 405 ug/dL (ref 250–450)
UIBC: 375 ug/dL

## 2017-04-29 LAB — FERRITIN: FERRITIN: 10 ng/mL — AB (ref 11–307)

## 2017-04-29 LAB — VITAMIN B12: VITAMIN B 12: 703 pg/mL (ref 180–914)

## 2017-04-29 NOTE — Progress Notes (Signed)
[facility] Initial Visit:  Patient Care Team: Lemmie Evens, MD as PCP - General (Family Medicine) Danie Binder, MD as Consulting Physician (Gastroenterology) Lemmie Evens, MD (Family Medicine)  CHIEF COMPLAINTS/PURPOSE OF CONSULTATION:  Abnormal weight loss  HISTORY OF PRESENTING ILLNESS: [name] 83 y.o. female presents with her daughter today for evaluation of abnormal weight loss.  Patient states she is lost about 30 pounds unintentionally since June 2017.  She states her appetite is very good and she cooks her own meals and they are very nutritious.  She lives alone and performs most of her own ADLs.  Her daughter does have a housekeeper that comes out to help with major cleaning for the patient.  Patient had a CT abdomen and pelvis without contrast performed on 02/02/2017 for evaluation for weight loss and was noted to have increased attenuation in the liver of unknown certain significance, chronic calcification of the left adrenal gland, minimal right lower lobe infiltrate, otherwise no acute abnormalities.  Patient had a chest x-ray performed on 04/19/2017 which showed no acute cardiopulmonary disease.  Patient also had a complete abdominal ultrasound performed on 04/19/2017 which was negative for gallstones, no ductal dilatation, normal echogenicity in the liver parenchyma and no focal abnormalities were seen in the liver.  Her most recent blood work from 04/13/2017 demonstrated CMP with a creatinine of 1.58, BUN 34, and GFR 34, AST 38, ALT 39, hemoglobin A1c 5.3, sed rate 34.  CBC on 04/13/2017 demonstrated WBC 3.9K, hemoglobin 8.8 g/dL, hematocrit 28.2%, MCV 88.4, platelet count 214K, differential was normal.  Review of Systems - Oncology ROS as per HPI otherwise 12 point ROS is negative.  MEDICAL HISTORY: Past Medical History:  Diagnosis Date  . Anxiety   . Atrial fibrillation ([place])   . COPD (chronic obstructive pulmonary disease) ([place])   .  Depression   . Hyperlipidemia   . Hypertension   . Hypothyroidism   . Mitral regurgitation    Mild  . Palpitations    PAT/EAT/NSVT, normal LVEF  . Type 2 diabetes mellitus ([place])     SURGICAL HISTORY: Past Surgical History:  Procedure Laterality Date  . 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 hiatal hernia. Antral erosions status post gastric biopsy.  . ESOPHAGOGASTRODUODENOSCOPY N/A 05/12/2016   Procedure: ESOPHAGOGASTRODUODENOSCOPY (EGD);  Surgeon: Daneil Dolin, MD;  Location: [facility];  Service: Endoscopy;  Laterality: N/A;  730   . MALONEY DILATION N/A 04/17/2014   Procedure: Venia Minks DILATION;  Surgeon: Daneil Dolin, MD;  Location: [facility];  Service: Endoscopy;  Laterality: N/A;  Venia Minks DILATION N/A 05/12/2016   Procedure: Venia Minks DILATION;  Surgeon: Daneil Dolin, MD;  Location: [facility];  Service: Endoscopy;  Laterality: N/A;  . none      SOCIAL HISTORY: Social History   Socioeconomic History  . Marital status: Married    Spouse name: Not on file  . Number of children: Not on file  . Years of education: 80  . Highest education level: Not on file  Social Needs  . Financial resource strain: Not on file  . Food insecurity - worry: Not on file  . Food insecurity - inability: Not on file  . Transportation needs - medical: Not on file  . Transportation needs - non-medical: Not on file  Occupational History  . Occupation: Retired    Fish farm manager: RETIRED  Tobacco Use  .  Smoking status: Never Smoker  . Smokeless tobacco: Never Used  Substance and Sexual Activity  . Alcohol use: No    Alcohol/week: 0.0 oz  . Drug use: No  . Sexual activity: No  Other Topics Concern  . Not on file  Social History Narrative  . Not on file    FAMILY HISTORY Family History  Problem Relation Age of Onset  . Heart failure Mother        Died in her 73s   . Tuberculosis Father        Died at young age  . Cancer Brother   . Arthritis Unknown   . Colon cancer Unknown     ALLERGIES:  is allergic to chocolate.  MEDICATIONS:  Current Outpatient Medications  Medication Sig Dispense Refill  . acetaminophen (TYLENOL) 650 MG CR tablet Take 650 mg by mouth as needed for pain.    Marland Kitchen ALPRAZolam (XANAX) 0.25 MG tablet Take 0.25 mg by mouth daily as needed for anxiety or sleep.     Marland Kitchen amiodarone (PACERONE) 200 MG tablet TAKE 1 TABLET BY MOUTH EVERY DAY 90 tablet 3  . aspirin buffered (BUFFERIN) 325 MG TABS tablet Take 325 mg by mouth daily as needed (pain).    Marland Kitchen atorvastatin (LIPITOR) 40 MG tablet Take 40 mg by mouth daily at 6 PM.    . bismuth subsalicylate (PEPTO BISMOL) 262 MG/15ML suspension Take 30 mLs by mouth daily as needed for indigestion.    . cetirizine (ZYRTEC) 10 MG tablet Take 5-10 mg by mouth daily as needed for allergies or rhinitis.     . cloNIDine (CATAPRES) 0.1 MG tablet TAKE ONE TABLET BY MOUTH EVERY EVENING 30 tablet 6  . COD LIVER OIL PO Take 15 mLs by mouth daily.    Marland Kitchen ELIQUIS 2.5 MG TABS tablet TAKE ONE TABLET BY MOUTH TWICE DAILY 60 tablet 3  . ferrous sulfate 325 (65 FE) MG tablet Take 325 mg by mouth daily with breakfast.    . furosemide (LASIX) 20 MG tablet TAKE 1 TABLET BY MOUTH EVERY DAY AS NEEDED FOR FLUID 30 tablet 6  . glimepiride (AMARYL) 1 MG tablet Take 1 mg by mouth daily before breakfast.     . guaiFENesin-dextromethorphan (ROBITUSSIN DM) 100-10 MG/5ML syrup Take 5 mLs by mouth every 4 (four) hours as needed for cough (SUGAR FREE).    Marland Kitchen levothyroxine (SYNTHROID, LEVOTHROID) 25 MCG tablet Take 25 mcg by mouth daily before breakfast.    . lisinopril (PRINIVIL,ZESTRIL) 20 MG tablet TAKE ONE TABLET BY MOUTH TWICE DAILY 180 tablet 3  . metoprolol succinate (TOPROL-XL) 50 MG 24 hr tablet Take 1 tablet (50 mg total) by mouth daily. Take with or immediately following a meal. 90 tablet 3  . omeprazole (PRILOSEC) 20 MG  capsule TAKE 1 CAPSULE BY MOUTH EVERY DAY 30 capsule 5  . polyethylene glycol (MIRALAX / GLYCOLAX) packet Take 17 g 2 (two) times a week by mouth.    . potassium chloride SA (K-DUR,KLOR-CON) 10 MEQ tablet Take 2 tablets (20 mEq total) by mouth daily. 3 tablet 0  . spironolactone (ALDACTONE) 25 MG tablet TAKE ONE TABLET BY MOUTH EVERY MORNING 30 tablet 6  . umeclidinium bromide (INCRUSE ELLIPTA) 62.5 MCG/INH AEPB Inhale 1 puff into the lungs daily.     No current facility-administered medications for this visit.     PHYSICAL EXAMINATION:  ECOG PERFORMANCE STATUS: 1 - Symptomatic but completely ambulatory   Vitals:   04/29/17 0952  BP: (!) 169/58  Pulse: 62  Resp: 18  Temp: 97.6 F (36.4 C)  SpO2: 100%    Filed Weights   04/29/17 0952  Weight: 128 lb (58.1 kg)     Physical Exam Constitutional: Well-developed, well-nourished, and in no distress.   HENT:  Head: Normocephalic and atraumatic.  Mouth/Throat: No oropharyngeal exudate. Mucosa moist. Eyes: Pupils are equal, round, and reactive to light. Conjunctivae are normal. No scleral icterus.  Neck: Normal range of motion. Neck supple. No JVD present.  Cardiovascular: Normal rate, regular rhythm and normal heart sounds.  Exam reveals no gallop and no friction rub.   No murmur heard. Pulmonary/Chest: Effort normal and breath sounds normal. No respiratory distress. No wheezes.No rales.  Abdominal: Soft. Bowel sounds are normal. No distension. There is no tenderness. There is no guarding.  Musculoskeletal: No tenderness. 1+ trace edema. Lymphadenopathy:    No cervical or supraclavicular adenopathy.  Neurological: Alert and oriented to person, place, and time. No cranial nerve deficit.  Skin: Skin is warm and dry. No rash noted. No erythema. No pallor.  Psychiatric: Affect and judgment normal.    LABORATORY DATA: I have personally reviewed the data as listed:  Appointment on 04/29/2017  Component Date Value Ref Range  Status  . WBC 04/29/2017 3.6* 4.0 - 10.5 K/uL Final  . RBC 04/29/2017 3.07* 3.87 - 5.11 MIL/uL Final  . Hemoglobin 04/29/2017 8.7* 12.0 - 15.0 g/dL Final  . HCT 04/29/2017 27.8* 36.0 - 46.0 % Final  . MCV 04/29/2017 90.6  78.0 - 100.0 fL Final  . MCH 04/29/2017 28.3  26.0 - 34.0 pg Final  . MCHC 04/29/2017 31.3  30.0 - 36.0 g/dL Final  . RDW 04/29/2017 16.4* 11.5 - 15.5 % Final  . Platelets 04/29/2017 242  150 - 400 K/uL Final  . Neutrophils Relative % 04/29/2017 62  % Final  . Neutro Abs 04/29/2017 2.3  1.7 - 7.7 K/uL Final  . Lymphocytes Relative 04/29/2017 30  % Final  . Lymphs Abs 04/29/2017 1.1  0.7 - 4.0 K/uL Final  . Monocytes Relative 04/29/2017 7  % Final  . Monocytes Absolute 04/29/2017 0.2  0.1 - 1.0 K/uL Final  . Eosinophils Relative 04/29/2017 1  % Final  . Eosinophils Absolute 04/29/2017 0.0  0.0 - 0.7 K/uL Final  . Basophils Relative 04/29/2017 0  % Final  . Basophils Absolute 04/29/2017 0.0  0.0 - 0.1 K/uL Final  . Sodium 04/29/2017 136  135 - 145 mmol/L Final  . Potassium 04/29/2017 3.7  3.5 - 5.1 mmol/L Final  . Chloride 04/29/2017 107  101 - 111 mmol/L Final  . CO2 04/29/2017 22  22 - 32 mmol/L Final  . Glucose, Bld 04/29/2017 87  65 - 99 mg/dL Final  . BUN 04/29/2017 22* 6 - 20 mg/dL Final  . Creatinine, Ser 04/29/2017 1.18* 0.44 - 1.00 mg/dL Final  . Calcium 04/29/2017 8.9  8.9 - 10.3 mg/dL Final  . Total Protein 04/29/2017 6.8  6.5 - 8.1 g/dL Final  . Albumin 04/29/2017 3.7  3.5 - 5.0 g/dL Final  . AST 04/29/2017 45* 15 - 41 U/L Final  . ALT 04/29/2017 54  14 - 54 U/L Final  . Alkaline Phosphatase 04/29/2017 68  38 - 126 U/L Final  . Total Bilirubin 04/29/2017 0.7  0.3 - 1.2 mg/dL Final  . GFR calc non Af Amer 04/29/2017 40* >60 mL/min Final  . GFR calc Af Amer 04/29/2017 46* >60 mL/min Final   Comment: (NOTE) The eGFR has been calculated  using the CKD EPI equation. This calculation has not been validated in all clinical situations. eGFR's persistently <60  mL/min signify possible Chronic Kidney Disease.   . Anion gap 04/29/2017 7  5 - 15 Final  . Retic Ct Pct 04/29/2017 1.1  0.4 - 3.1 % Final  . RBC. 04/29/2017 3.07* 3.87 - 5.11 MIL/uL Final  . Retic Count, Absolute 04/29/2017 33.8  19.0 - 186.0 K/uL Final  . LDH 04/29/2017 215* 98 - 192 U/L Final    RADIOGRAPHIC STUDIES: I have personally reviewed the radiological images as listed and agree with the findings in the report  No results found.  ASSESSMENT/PLAN 1. Abnormal unintentional weight loss- - I have low suspicion that her weight loss is due to a solid malignancy given recent negative CXR, abd [place], and CT abd/pelvis which have all been done in the last 3 months.  -Consider a nutrition consult.  2. Anemia in the setting of CKD. -Patient does have significant anemia with hemoglobin in the 8 g/dL range. Will perform an anemia workup with labs below. -RTC in 2 weeks to review labs and discuss next plan of care. If all the workup labs are negative for a cause for her anemia other than anemia of chronic renal disease, will plan to start aranesp/procrit.  Orders Placed This Encounter  Procedures  . CBC with Differential    Standing Status:   Future    Number of Occurrences:   1    Standing Expiration Date:   04/29/2018  . Comprehensive metabolic panel    Standing Status:   Future    Number of Occurrences:   1    Standing Expiration Date:   04/29/2018  . Multiple Myeloma Panel (SPEP&IFE w/QIG)    Standing Status:   Future    Number of Occurrences:   1    Standing Expiration Date:   04/29/2018  . Kappa/lambda light chains    Standing Status:   Future    Number of Occurrences:   1    Standing Expiration Date:   04/29/2018  . Erythropoietin    Standing Status:   Future    Number of Occurrences:   1    Standing Expiration Date:   04/29/2018  . Iron and TIBC    Standing Status:   Future    Number of Occurrences:   1    Standing Expiration Date:   04/29/2018  . Ferritin    Standing  Status:   Future    Number of Occurrences:   1    Standing Expiration Date:   04/29/2018  . Vitamin B12    Standing Status:   Future    Number of Occurrences:   1    Standing Expiration Date:   04/29/2018  . Folate    Standing Status:   Future    Number of Occurrences:   1    Standing Expiration Date:   04/29/2018  . Reticulocytes    Standing Status:   Future    Number of Occurrences:   1    Standing Expiration Date:   04/29/2018  . Hemoglobinopathy evaluation    Standing Status:   Future    Number of Occurrences:   1    Standing Expiration Date:   04/29/2018  . Lactate dehydrogenase    Standing Status:   Future    Number of Occurrences:   1    Standing Expiration Date:   04/29/2018    All questions were answered. The  patient knows to call the clinic with any problems, questions or concerns.  This note was electronically signed.    Twana First, MD  04/29/2017 11:33 AM

## 2017-04-29 NOTE — Patient Instructions (Signed)
[facility] at [facility]  Discharge Instructions:  You were seen by DR. Zhou today _______________________________________________________________  Thank you for choosing [facility] at [facility] to provide your oncology and hematology care.  To afford each patient quality time with our providers, please arrive at least 15 minutes before your scheduled appointment.  You need to re-schedule your appointment if you arrive 10 or more minutes late.  We strive to give you quality time with our providers, and arriving late affects you and other patients whose appointments are after yours.  Also, if you no show three or more times for appointments you may be dismissed from the clinic.  Again, thank you for choosing [facility] at [facility]. Our hope is that these requests will allow you access to exceptional care and in a timely manner. _______________________________________________________________  If you have questions after your visit, please contact our office at [phone] between the hours of 8:30 a.m. and 5:00 p.m. Voicemails left after 4:30 p.m. will not be returned until the following business day. _______________________________________________________________  For prescription refill requests, have your pharmacy contact our office. _______________________________________________________________  Recommendations made by the consultant and any test results will be sent to your referring physician. _______________________________________________________________ 

## 2017-04-30 LAB — ERYTHROPOIETIN: Erythropoietin: 31.2 m[IU]/mL — ABNORMAL HIGH (ref 2.6–18.5)

## 2017-05-02 LAB — MULTIPLE MYELOMA PANEL, SERUM
ALBUMIN/GLOB SERPL: 1.1 (ref 0.7–1.7)
ALPHA2 GLOB SERPL ELPH-MCNC: 0.7 g/dL (ref 0.4–1.0)
Albumin SerPl Elph-Mcnc: 3.4 g/dL (ref 2.9–4.4)
Alpha 1: 0.2 g/dL (ref 0.0–0.4)
B-GLOBULIN SERPL ELPH-MCNC: 1.1 g/dL (ref 0.7–1.3)
GAMMA GLOB SERPL ELPH-MCNC: 1.1 g/dL (ref 0.4–1.8)
GLOBULIN, TOTAL: 3.1 g/dL (ref 2.2–3.9)
IGG (IMMUNOGLOBIN G), SERUM: 1084 mg/dL (ref 700–1600)
IgA: 445 mg/dL — ABNORMAL HIGH (ref 64–422)
IgM (Immunoglobulin M), Srm: 55 mg/dL (ref 26–217)
Total Protein ELP: 6.5 g/dL (ref 6.0–8.5)

## 2017-05-02 LAB — KAPPA/LAMBDA LIGHT CHAINS
KAPPA, LAMDA LIGHT CHAIN RATIO: 2.26 — AB (ref 0.26–1.65)
Kappa free light chain: 51 mg/L — ABNORMAL HIGH (ref 3.3–19.4)
LAMDA FREE LIGHT CHAINS: 22.6 mg/L (ref 5.7–26.3)

## 2017-05-04 LAB — HEMOGLOBINOPATHY EVALUATION
HGB A2 QUANT: 2 % (ref 1.8–3.2)
HGB A: 98 % (ref 96.4–98.8)
HGB C: 0 %
HGB F QUANT: 0 % (ref 0.0–2.0)
HGB S QUANTITAION: 0 %
HGB VARIANT: 0 %

## 2017-05-12 ENCOUNTER — Other Ambulatory Visit: Payer: Self-pay | Admitting: Cardiology

## 2017-05-12 ENCOUNTER — Encounter (HOSPITAL_COMMUNITY): Payer: Self-pay | Admitting: Oncology

## 2017-05-12 ENCOUNTER — Encounter (HOSPITAL_COMMUNITY): Payer: Medicare Other | Attending: Oncology | Admitting: Oncology

## 2017-05-12 ENCOUNTER — Other Ambulatory Visit: Payer: Self-pay

## 2017-05-12 DIAGNOSIS — D631 Anemia in chronic kidney disease: Secondary | ICD-10-CM

## 2017-05-12 DIAGNOSIS — R634 Abnormal weight loss: Secondary | ICD-10-CM

## 2017-05-12 DIAGNOSIS — D508 Other iron deficiency anemias: Secondary | ICD-10-CM

## 2017-05-12 DIAGNOSIS — N189 Chronic kidney disease, unspecified: Secondary | ICD-10-CM

## 2017-05-12 DIAGNOSIS — D509 Iron deficiency anemia, unspecified: Secondary | ICD-10-CM | POA: Insufficient documentation

## 2017-05-13 NOTE — Progress Notes (Signed)
[facility] Initial Visit:  Patient Care Team: Lemmie Evens, MD as PCP - General (Family Medicine) Danie Binder, MD as Consulting Physician (Gastroenterology) Lemmie Evens, MD (Family Medicine)  CHIEF COMPLAINTS/PURPOSE OF CONSULTATION:  Abnormal weight loss  HISTORY OF PRESENTING ILLNESS: [name] 83 y.o. female presents with her daughter today for evaluation of abnormal weight loss.  Patient states she is lost about 30 pounds unintentionally since June 2017.  She states her appetite is very good and she cooks her own meals and they are very nutritious.  She lives alone and performs most of her own ADLs.  Her daughter does have a housekeeper that comes out to help with major cleaning for the patient.  Patient had a CT abdomen and pelvis without contrast performed on 02/02/2017 for evaluation for weight loss and was noted to have increased attenuation in the liver of unknown certain significance, chronic calcification of the left adrenal gland, minimal right lower lobe infiltrate, otherwise no acute abnormalities.  Patient had a chest x-ray performed on 04/19/2017 which showed no acute cardiopulmonary disease.  Patient also had a complete abdominal ultrasound performed on 04/19/2017 which was negative for gallstones, no ductal dilatation, normal echogenicity in the liver parenchyma and no focal abnormalities were seen in the liver.  Her most recent blood work from 04/13/2017 demonstrated CMP with a creatinine of 1.58, BUN 34, and GFR 34, AST 38, ALT 39, hemoglobin A1c 5.3, sed rate 34.  CBC on 04/13/2017 demonstrated WBC 3.9K, hemoglobin 8.8 g/dL, hematocrit 28.2%, MCV 88.4, platelet count 214K, differential was normal.  INTERVAL HISTORY: Patient presents today with her daughter for continued follow-up and review of her anemia workup.  Since her last visit she has lost another 5 pounds.  However she states she continues to herself very healthy meals and eats 3 meals  a day with evening snack around 9 PM.  She states her appetite is great.  She denies any chest pain, shortness of breath, abdominal pain, nausea, vomiting, diarrhea, focal weakness.  She denies any bleeding including melena, hematochezia, hematuria.  Review of Systems - Oncology ROS as per HPI otherwise 12 point ROS is negative.  MEDICAL HISTORY: Past Medical History:  Diagnosis Date  . Anxiety   . Atrial fibrillation ([place])   . COPD (chronic obstructive pulmonary disease) ([place])   . Depression   . Hyperlipidemia   . Hypertension   . Hypothyroidism   . Mitral regurgitation    Mild  . Palpitations    PAT/EAT/NSVT, normal LVEF  . Type 2 diabetes mellitus ([place])     SURGICAL HISTORY: Past Surgical History:  Procedure Laterality Date  . 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 hiatal hernia. Antral erosions status post gastric biopsy.  . ESOPHAGOGASTRODUODENOSCOPY N/A 05/12/2016   Procedure: ESOPHAGOGASTRODUODENOSCOPY (EGD);  Surgeon: Daneil Dolin, MD;  [facility];  Service: Endoscopy;  Laterality: N/A;  730   . MALONEY DILATION N/A 04/17/2014   Procedure: Venia Minks DILATION;  Surgeon: Daneil Dolin, MD;  Location: [facility];  Service: Endoscopy;  Laterality: N/A;  Venia Minks DILATION N/A 05/12/2016   Procedure: Venia Minks DILATION;  Surgeon: Daneil Dolin, MD;  Location: [facility];  Service: Endoscopy;  Laterality: N/A;  . none      SOCIAL HISTORY: Social History   Socioeconomic History  . Marital status: Married    Spouse name: Not on file  .  Number of children: Not on file  . Years of education: 68  . Highest education level: Not on file  Social Needs  . Financial resource strain: Not on file  . Food insecurity - worry: Not on file  . Food insecurity - inability: Not on file  . Transportation needs - medical: Not on file  . Transportation needs  - non-medical: Not on file  Occupational History  . Occupation: Retired    Fish farm manager: RETIRED  Tobacco Use  . Smoking status: Never Smoker  . Smokeless tobacco: Never Used  Substance and Sexual Activity  . Alcohol use: No    Alcohol/week: 0.0 oz  . Drug use: No  . Sexual activity: No  Other Topics Concern  . Not on file  Social History Narrative  . Not on file    FAMILY HISTORY Family History  Problem Relation Age of Onset  . Heart failure Mother        Died in her 56s  . Tuberculosis Father        Died at young age  . Cancer Brother   . Arthritis Unknown   . Colon cancer Unknown     ALLERGIES:  is allergic to chocolate.  MEDICATIONS:  Current Outpatient Medications  Medication Sig Dispense Refill  . acetaminophen (TYLENOL) 650 MG CR tablet Take 650 mg by mouth as needed for pain.    Marland Kitchen ALPRAZolam (XANAX) 0.25 MG tablet Take 0.25 mg by mouth daily as needed for anxiety or sleep.     Marland Kitchen amiodarone (PACERONE) 200 MG tablet TAKE 1 TABLET BY MOUTH EVERY DAY 90 tablet 3  . aspirin buffered (BUFFERIN) 325 MG TABS tablet Take 325 mg by mouth daily as needed (pain).    Marland Kitchen atorvastatin (LIPITOR) 40 MG tablet Take 40 mg by mouth daily at 6 PM.    . bismuth subsalicylate (PEPTO BISMOL) 262 MG/15ML suspension Take 30 mLs by mouth daily as needed for indigestion.    . cetirizine (ZYRTEC) 10 MG tablet Take 5-10 mg by mouth daily as needed for allergies or rhinitis.     . cloNIDine (CATAPRES) 0.1 MG tablet TAKE ONE TABLET BY MOUTH EVERY EVENING 30 tablet 6  . COD LIVER OIL PO Take 15 mLs by mouth daily.    Marland Kitchen ELIQUIS 2.5 MG TABS tablet TAKE ONE TABLET BY MOUTH TWICE DAILY 60 tablet 3  . ferrous sulfate 325 (65 FE) MG tablet Take 325 mg by mouth daily with breakfast.    . furosemide (LASIX) 20 MG tablet TAKE 1 TABLET BY MOUTH EVERY DAY AS NEEDED FOR FLUID 30 tablet 6  . glimepiride (AMARYL) 1 MG tablet Take 1 mg by mouth daily before breakfast.     . guaiFENesin-dextromethorphan  (ROBITUSSIN DM) 100-10 MG/5ML syrup Take 5 mLs by mouth every 4 (four) hours as needed for cough (SUGAR FREE).    Marland Kitchen levothyroxine (SYNTHROID, LEVOTHROID) 25 MCG tablet Take 25 mcg by mouth daily before breakfast.    . lisinopril (PRINIVIL,ZESTRIL) 20 MG tablet TAKE ONE TABLET BY MOUTH TWICE DAILY 180 tablet 3  . metoprolol succinate (TOPROL-XL) 50 MG 24 hr tablet TAKE ONE TABLET BY MOUTH EVERY MORNING immediately following a meal. 90 tablet 3  . omeprazole (PRILOSEC) 20 MG capsule TAKE 1 CAPSULE BY MOUTH EVERY DAY 30 capsule 5  . polyethylene glycol (MIRALAX / GLYCOLAX) packet Take 17 g 2 (two) times a week by mouth.    . potassium chloride (MICRO-K) 10 MEQ CR capsule TAKE TWO CAPSULES BY MOUTH  DAILY FOR low potassium  5  . spironolactone (ALDACTONE) 25 MG tablet TAKE ONE TABLET BY MOUTH EVERY MORNING 30 tablet 6  . umeclidinium bromide (INCRUSE ELLIPTA) 62.5 MCG/INH AEPB Inhale 1 puff into the lungs daily.     No current facility-administered medications for this visit.     PHYSICAL EXAMINATION:  ECOG PERFORMANCE STATUS: 1 - Symptomatic but completely ambulatory   Vitals:   05/12/17 1342  BP: (!) 185/56  Pulse: (!) 59  Resp: 18  Temp: 97.9 F (36.6 C)  SpO2: 100%    Filed Weights   05/12/17 1342  Weight: 123 lb 6.4 oz (56 kg)     Physical Exam Constitutional: Well-developed, well-nourished, and in no distress.   HENT:  Head: Normocephalic and atraumatic.  Mouth/Throat: No oropharyngeal exudate. Mucosa moist. Eyes: Pupils are equal, round, and reactive to light. Conjunctivae are normal. No scleral icterus.  Neck: Normal range of motion. Neck supple. No JVD present.  Cardiovascular: Normal rate, regular rhythm and normal heart sounds.  Exam reveals no gallop and no friction rub.   No murmur heard. Pulmonary/Chest: Effort normal and breath sounds normal. No respiratory distress. No wheezes.No rales.  Abdominal: Soft. Bowel sounds are normal. No distension. There is no  tenderness. There is no guarding.  Musculoskeletal: No tenderness. 1+ trace edema. Lymphadenopathy:    No cervical or supraclavicular adenopathy.  Neurological: Alert and oriented to person, place, and time. No cranial nerve deficit.  Skin: Skin is warm and dry. No rash noted. No erythema. No pallor.  Psychiatric: Affect and judgment normal.    LABORATORY DATA: I have personally reviewed the data as listed:  Appointment on 04/29/2017  Component Date Value Ref Range Status  . WBC 04/29/2017 3.6* 4.0 - 10.5 K/uL Final  . RBC 04/29/2017 3.07* 3.87 - 5.11 MIL/uL Final  . Hemoglobin 04/29/2017 8.7* 12.0 - 15.0 g/dL Final  . HCT 04/29/2017 27.8* 36.0 - 46.0 % Final  . MCV 04/29/2017 90.6  78.0 - 100.0 fL Final  . MCH 04/29/2017 28.3  26.0 - 34.0 pg Final  . MCHC 04/29/2017 31.3  30.0 - 36.0 g/dL Final  . RDW 04/29/2017 16.4* 11.5 - 15.5 % Final  . Platelets 04/29/2017 242  150 - 400 K/uL Final  . Neutrophils Relative % 04/29/2017 62  % Final  . Neutro Abs 04/29/2017 2.3  1.7 - 7.7 K/uL Final  . Lymphocytes Relative 04/29/2017 30  % Final  . Lymphs Abs 04/29/2017 1.1  0.7 - 4.0 K/uL Final  . Monocytes Relative 04/29/2017 7  % Final  . Monocytes Absolute 04/29/2017 0.2  0.1 - 1.0 K/uL Final  . Eosinophils Relative 04/29/2017 1  % Final  . Eosinophils Absolute 04/29/2017 0.0  0.0 - 0.7 K/uL Final  . Basophils Relative 04/29/2017 0  % Final  . Basophils Absolute 04/29/2017 0.0  0.0 - 0.1 K/uL Final  . Sodium 04/29/2017 136  135 - 145 mmol/L Final  . Potassium 04/29/2017 3.7  3.5 - 5.1 mmol/L Final  . Chloride 04/29/2017 107  101 - 111 mmol/L Final  . CO2 04/29/2017 22  22 - 32 mmol/L Final  . Glucose, Bld 04/29/2017 87  65 - 99 mg/dL Final  . BUN 04/29/2017 22* 6 - 20 mg/dL Final  . Creatinine, Ser 04/29/2017 1.18* 0.44 - 1.00 mg/dL Final  . Calcium 04/29/2017 8.9  8.9 - 10.3 mg/dL Final  . Total Protein 04/29/2017 6.8  6.5 - 8.1 g/dL Final  . Albumin 04/29/2017 3.7  3.5 -  5.0 g/dL  Final  . AST 04/29/2017 45* 15 - 41 U/L Final  . ALT 04/29/2017 54  14 - 54 U/L Final  . Alkaline Phosphatase 04/29/2017 68  38 - 126 U/L Final  . Total Bilirubin 04/29/2017 0.7  0.3 - 1.2 mg/dL Final  . GFR calc non Af Amer 04/29/2017 40* >60 mL/min Final  . GFR calc Af Amer 04/29/2017 46* >60 mL/min Final   Comment: (NOTE) The eGFR has been calculated using the CKD EPI equation. This calculation has not been validated in all clinical situations. eGFR's persistently <60 mL/min signify possible Chronic Kidney Disease.   . Anion gap 04/29/2017 7  5 - 15 Final  . IgG (Immunoglobin G), Serum 04/29/2017 1,084  700 - 1,600 mg/dL Final  . IgA 04/29/2017 445* 64 - 422 mg/dL Final  . IgM (Immunoglobulin M), Srm 04/29/2017 55  26 - 217 mg/dL Final  . Total Protein ELP 04/29/2017 6.5  6.0 - 8.5 g/dL Corrected  . Albumin SerPl Elph-Mcnc 04/29/2017 3.4  2.9 - 4.4 g/dL Corrected  . Alpha 1 04/29/2017 0.2  0.0 - 0.4 g/dL Corrected  . Alpha2 Glob SerPl Elph-Mcnc 04/29/2017 0.7  0.4 - 1.0 g/dL Corrected  . B-Globulin SerPl Elph-Mcnc 04/29/2017 1.1  0.7 - 1.3 g/dL Corrected  . Gamma Glob SerPl Elph-Mcnc 04/29/2017 1.1  0.4 - 1.8 g/dL Corrected  . M Protein SerPl Elph-Mcnc 04/29/2017 Not Observed  Not Observed g/dL Corrected  . Globulin, Total 04/29/2017 3.1  2.2 - 3.9 g/dL Corrected  . Albumin/Glob SerPl 04/29/2017 1.1  0.7 - 1.7 Corrected  . IFE 1 04/29/2017 Comment   Corrected   Comment: (NOTE) An apparent polyclonal gammopathy: IgA. Kappa and lambda typing appear increased.   . Please Note 04/29/2017 Comment   Corrected   Comment: (NOTE) Protein electrophoresis scan will follow via computer, mail, or courier delivery. Performed At: [facility] [place], [place] [id number] Rush Farmer MD [id number]   . Kappa free light chain 04/29/2017 51.0* 3.3 - 19.4 mg/L Final  . Lamda free light chains 04/29/2017 22.6  5.7 - 26.3 mg/L Final  . Kappa, lamda light chain ratio  04/29/2017 2.26* 0.26 - 1.65 Final   Comment: (NOTE) Performed At: [facility] [place], [place] [id number] Rush Farmer MD [id number]   . Erythropoietin 04/29/2017 31.2* 2.6 - 18.5 mIU/mL Final   Comment: (NOTE) [employer] UniCel DxI 800 Immunoassay System Performed At: [facility] [place], [place] [id number] Rush Farmer MD [id number]   . Iron 04/29/2017 30  28 - 170 ug/dL Final  . TIBC 04/29/2017 405  250 - 450 ug/dL Final  . Saturation Ratios 04/29/2017 7* 10.4 - 31.8 % Final  . UIBC 04/29/2017 375  ug/dL Final   Performed at [facility], [place] [address], [place], [place] [address]  . Ferritin 04/29/2017 10* 11 - 307 ng/mL Final   Performed at [facility], [place] [address] [place], [place] [address]  . Vitamin B-12 04/29/2017 703  180 - 914 pg/mL Final   Comment: (NOTE) This assay is not validated for testing neonatal or myeloproliferative syndrome specimens for Vitamin B12 levels. Performed at [facility], [place] [address] [place], [place] [address]   . Folate 04/29/2017 10.5  >5.9 ng/mL Final   Performed at [place] [address] [place], [place] [address]  . Retic Ct Pct 04/29/2017 1.1  0.4 - 3.1 % Final  .  RBC. 04/29/2017 3.07* 3.87 - 5.11 MIL/uL Final  . Retic Count, Absolute 04/29/2017 33.8  19.0 - 186.0 K/uL Final  . Hgb A2 Quant 04/29/2017 2.0  1.8 - 3.2 % Final  . Hgb F Quant 04/29/2017 0.0  0.0 - 2.0 % Final  . Hgb S Quant 04/29/2017 0.0  0.0 % Final  . Hgb C 04/29/2017 0.0  0.0 % Corrected  . Hgb A 04/29/2017 98.0  96.4 - 98.8 % Final  . Hgb Variant 04/29/2017 0.0  0.0 % Corrected  . Please Note: 04/29/2017 Comment   Corrected   Comment: (NOTE) Normal adult hemoglobin present. Performed At: [facility] [place], [place] [id number] Rush Farmer MD [id number]   . LDH 04/29/2017 215* 98 - 192 U/L Final    RADIOGRAPHIC STUDIES: I have  personally reviewed the radiological images as listed and agree with the findings in the report  No results found.  ASSESSMENT/PLAN 1. Abnormal unintentional weight loss- - I have low suspicion that her weight loss is due to a solid malignancy given recent negative CXR, abd [place], and CT abd/pelvis which have all been done in the last 3 months.  - I have placed in a nutrition consult.  2. Anemia in the setting of CKD. -Patient does have significant anemia with hemoglobin in the 8 g/dL range.  -I have reviewed her anemia workup labs in detail with her and her daughter today.  SPEP/IFE was negative for monoclonal paraprotein and demonstrated an apparent polyclonal gammopathy, IgA kappa lambda typing appearing increased. This is likely due to underlying inflammation and is not diagnostic of MM.  Folate and vitamin B12 are within normal limits.  Patients EPO level should be higher given the severity of anemia. Patient has iron deficiency with a ferritin of 10, serum iron 30.  Calculated her iron deficiency is 1500.  I have discussed given her 2 doses of IV Injectafer and patient is willing to proceed, therefore will get it set up.   -If she still has an anemia after correction of her iron deficiency, she may benefit from ESA to get her hemoglobin close to 11 g/dL. -Return to clinic in 2 months for follow-up with repeat iron studies and CBC.   Orders Placed This Encounter  Procedures  . CBC with Differential    Standing Status:   Future    Standing Expiration Date:   05/13/2018  . Comprehensive metabolic panel    Standing Status:   Future    Standing Expiration Date:   05/13/2018  . Iron and TIBC    Standing Status:   Future    Standing Expiration Date:   05/13/2018  . Ferritin    Standing Status:   Future    Standing Expiration Date:   05/13/2018  . Erythropoietin    Standing Status:   Future    Standing Expiration Date:   05/13/2018    All questions were answered. The patient knows to  call the clinic with any problems, questions or concerns.  This note was electronically signed.    Twana First, MD  05/13/2017 3:41 PM

## 2017-05-27 ENCOUNTER — Inpatient Hospital Stay (HOSPITAL_COMMUNITY): Payer: Medicare Other | Attending: Oncology

## 2017-05-27 ENCOUNTER — Encounter (HOSPITAL_COMMUNITY): Payer: Self-pay

## 2017-05-27 ENCOUNTER — Inpatient Hospital Stay (HOSPITAL_BASED_OUTPATIENT_CLINIC_OR_DEPARTMENT_OTHER): Payer: Medicare Other

## 2017-05-27 ENCOUNTER — Other Ambulatory Visit: Payer: Self-pay

## 2017-05-27 VITALS — BP 165/63 | HR 50 | Temp 97.9°F | Resp 18 | Wt 132.0 lb

## 2017-05-27 DIAGNOSIS — D508 Other iron deficiency anemias: Secondary | ICD-10-CM

## 2017-05-27 DIAGNOSIS — D509 Iron deficiency anemia, unspecified: Secondary | ICD-10-CM | POA: Insufficient documentation

## 2017-05-27 MED ORDER — FERRIC CARBOXYMALTOSE 750 MG/15ML IV SOLN
750.0000 mg | Freq: Once | INTRAVENOUS | Status: AC
  Administered 2017-05-27: 750 mg via INTRAVENOUS
  Filled 2017-05-27: qty 15

## 2017-05-27 MED ORDER — SODIUM CHLORIDE 0.9 % IV SOLN
INTRAVENOUS | Status: DC
  Administered 2017-05-27: 14:00:00 via INTRAVENOUS

## 2017-05-27 NOTE — Patient Instructions (Signed)
[facility] at [facility] Discharge Instructions  RECOMMENDATIONS MADE BY THE CONSULTANT AND ANY TEST RESULTS WILL BE SENT TO YOUR REFERRING PHYSICIAN.  Injectafer given today Follow up as scheduled.  Thank you for choosing [facility] at [facility] to provide your oncology and hematology care.  To afford each patient quality time with our provider, please arrive at least 15 minutes before your scheduled appointment time.    If you have a lab appointment with the Cancer Center please come in thru the  Main Entrance and check in at the main information desk  You need to re-schedule your appointment should you arrive 10 or more minutes late.  We strive to give you quality time with our providers, and arriving late affects you and other patients whose appointments are after yours.  Also, if you no show three or more times for appointments you may be dismissed from the clinic at the providers discretion.     Again, thank you for choosing [facility].  Our hope is that these requests will decrease the amount of time that you wait before being seen by our physicians.       _____________________________________________________________  Should you have questions after your visit to [facility], please contact our office at [phone] between the hours of 8:30 a.m. and 4:30 p.m.  Voicemails left after 4:30 p.m. will not be returned until the following business day.  For prescription refill requests, have your pharmacy contact our office.       Resources For Cancer Patients and their Caregivers ? American Cancer Society: Can assist with transportation, wigs, general needs, runs Look Good Feel Better.        1-888-227-6333 ? Cancer Care: Provides financial assistance, online support groups, medication/co-pay assistance.  1-800-813-HOPE (4673) ? [facility] Assists [place] cancer patients and  their families through emotional , educational and financial support.  336-427-4357 ? [place] DSS Where to apply for food stamps, Medicaid and utility assistance. 336-342-1394 ? [facility]: Transportation to medical appointments. 336-347-2287 ? Social Security Administration: May apply for disability if have a Stage IV cancer. 336-342-7796 1-800-772-1213 ? [place] Aging, Disability and Transit Services: Assists with nutrition, care and transit needs. 336-349-2343  Cancer Center Support Programs: @10RELATIVEDAYS@ > Cancer Support Group  2nd Tuesday of the month 1pm-2pm, Journey Room  > Creative Journey  3rd Tuesday of the month 1130am-1pm, Journey Room  > Look Good Feel Better  1st Wednesday of the month 10am-12 noon, Journey Room (Call American Cancer Society to register 1-800-395-5775)   

## 2017-05-27 NOTE — Progress Notes (Signed)
Treatment given per orders. Patient tolerated it well without problems. Vitals stable and discharged home from clinic ambulatory. Follow up as scheduled.  

## 2017-05-27 NOTE — Progress Notes (Signed)
Nutrition Assessment   Reason for Assessment:   Weight loss  ASSESSMENT:  84 year old female with Fe deficiency anemia. Past medical history of afib, COPD, HLD, HTN, DM, dilation of schatzki ring 04/2016, constipation.    Met with patient and daughter during infusion of in injectafer.  Patient reports good appetite, "I eat and cook my meals."  Daughter reports patient uses fresh ingredients.  Patient reports that she eats 2 eggs, grits, sausage, coffee for breakfast.  For lunch usually eats 1 chicken leg, green beans, diet soda and sometimes a piece of cake. For dinner will fix turnip greens, roast cornbread and sometimes have a tomato sandwich (whole).  Typically has a evening snack of nabs or graham crackers.  Usually drinks water or diet drinks, sometimes milk.  Has recently started drinking ensure daily (not sure what type).  Patient reports some constipation but better with medication.    Daughter reports that over the past year and half patient has had trouble swallowing due to esophageal stricture which has been stretched and dialated.  During that time intake dropped, patient was pureeing foods and patient became afraid to eat.  Also patient has lost her husband of 62 years and daughter feels patient is still in mourning over him.  Medications were changed per daughter as well which she feels has effected intake and contributed to weight loss.  Fatigue and anemia also likely playing a role.    Nutrition Focused Physical Exam: deferred, patient in clothes and wrapped up in blanket  Medications: Fe, amaryl, prilosec, miralax, KCL  Labs: reviewed  Anthropometrics:   Height: 62 inches Weight: 132 lb today was 123 lb on 12/20 Noted 137 lb Jan 2018, 156 lb 10/2015 BMI: 24  Recent weight gain from office visit on 12/20  15% weight loss in the last year and half.   Estimated Energy Needs  Kcals: 1680-1900 calories/d Protein: 56-67 g/day Fluid: 1.6 L/d  NUTRITION DIAGNOSIS:  Inadequate oral intake related to dysphagia, death of spouse, anemia as evidenced by 15% weight loss over the last year and half   INTERVENTION:   Discussed daily calorie goal and provided patient with sample menu. Discussed ways to increase calories and protein to promote weight gain.  Fact sheet provided Also discussed foods high in iron. Fact sheet provided Discussed oral nutrition supplements and provided samples of ensure enlive and glucerna.  Provided patient with 1st case of ensure plus today.  Recommend 350 calorie shake at this time to promote weight gain vs lower calorie, diabetic friendly shake.  Would encourage medication adjustments vs restricting diet at this time due to previous weight loss.  Contact information provided to patient and daughter.     MONITORING, EVALUATION, GOAL: weight trends, intake   NEXT VISIT: Jan 11 during infusion  Tiffanyann Deroo B. Zenia Resides, [place], [place] Registered Dietitian [phone] (pager)

## 2017-06-03 ENCOUNTER — Encounter (HOSPITAL_COMMUNITY): Payer: Self-pay

## 2017-06-03 ENCOUNTER — Inpatient Hospital Stay (HOSPITAL_COMMUNITY): Payer: Medicare Other

## 2017-06-03 ENCOUNTER — Inpatient Hospital Stay (HOSPITAL_COMMUNITY): Payer: Medicare Other | Attending: Oncology

## 2017-06-03 VITALS — BP 164/54 | HR 56 | Temp 97.7°F | Resp 18

## 2017-06-03 DIAGNOSIS — D508 Other iron deficiency anemias: Secondary | ICD-10-CM

## 2017-06-03 DIAGNOSIS — D509 Iron deficiency anemia, unspecified: Secondary | ICD-10-CM | POA: Diagnosis not present

## 2017-06-03 MED ORDER — SODIUM CHLORIDE 0.9 % IV SOLN
750.0000 mg | Freq: Once | INTRAVENOUS | Status: AC
  Administered 2017-06-03: 750 mg via INTRAVENOUS
  Filled 2017-06-03: qty 15

## 2017-06-03 MED ORDER — SODIUM CHLORIDE 0.9 % IV SOLN
INTRAVENOUS | Status: DC
  Administered 2017-06-03: 14:00:00 via INTRAVENOUS

## 2017-06-03 NOTE — Patient Instructions (Signed)
[facility] at [facility] Discharge Instructions  RECOMMENDATIONS MADE BY THE CONSULTANT AND ANY TEST RESULTS WILL BE SENT TO YOUR REFERRING PHYSICIAN.  Received Injectafer infusion today. Follow-up as scheduled. Call clinic for any questions or concerns  Thank you for choosing [facility] at [facility] to provide your oncology and hematology care.  To afford each patient quality time with our provider, please arrive at least 15 minutes before your scheduled appointment time.    If you have a lab appointment with the Cancer Center please come in thru the  Main Entrance and check in at the main information desk  You need to re-schedule your appointment should you arrive 10 or more minutes late.  We strive to give you quality time with our providers, and arriving late affects you and other patients whose appointments are after yours.  Also, if you no show three or more times for appointments you may be dismissed from the clinic at the providers discretion.     Again, thank you for choosing [facility].  Our hope is that these requests will decrease the amount of time that you wait before being seen by our physicians.       _____________________________________________________________  Should you have questions after your visit to [facility], please contact our office at [phone] between the hours of 8:30 a.m. and 4:30 p.m.  Voicemails left after 4:30 p.m. will not be returned until the following business day.  For prescription refill requests, have your pharmacy contact our office.       Resources For Cancer Patients and their Caregivers ? American Cancer Society: Can assist with transportation, wigs, general needs, runs Look Good Feel Better.        1-888-227-6333 ? Cancer Care: Provides financial assistance, online support groups, medication/co-pay assistance.  1-800-813-HOPE (4673) ? [facility] Assists [place] cancer patients and their families through emotional , educational and financial support.  336-427-4357 ? [place] DSS Where to apply for food stamps, Medicaid and utility assistance. 336-342-1394 ? RCATS: Transportation to medical appointments. 336-347-2287 ? Social Security Administration: May apply for disability if have a Stage IV cancer. 336-342-7796 1-800-772-1213 ? [place] Aging, Disability and Transit Services: Assists with nutrition, care and transit needs. 336-349-2343  Cancer Center Support Programs: @10RELATIVEDAYS@ > Cancer Support Group  2nd Tuesday of the month 1pm-2pm, Journey Room  > Creative Journey  3rd Tuesday of the month 1130am-1pm, Journey Room  > Look Good Feel Better  1st Wednesday of the month 10am-12 noon, Journey Room (Call American Cancer Society to register 1-800-395-5775)   

## 2017-06-03 NOTE — Progress Notes (Signed)
Nutrition Follow-up:  Patient with Fe deficiency anemia and weight loss.  Patient seen in infusion (injectafer) with daughter.  Patient continues to report good appetite and eating 3 meals per day.  Has been trying to drink ensure plus at least daily but may drink some in am and rest in pm.    Medications: reviewed  Labs: no new labs today  Anthropometrics:   No new weight today   NUTRITION DIAGNOSIS: Inadequate oral intake improving   INTERVENTION:   Reviewed high calorie, high protein foods with patient and daughter.   Encouraged intake of oral nutrition supplements for added calories and protein to promote weight gain.   Questions answered.   Encouraged patient and daughter to reach out to RD with questions or concerns    MONITORING, EVALUATION, GOAL: weight trends, intake   NEXT VISIT: as needed, patient to contact RD  Kyndahl Jablon B. Freida BusmanAllen, RD, LDN Registered Dietitian [phone] (pager)

## 2017-06-03 NOTE — Progress Notes (Signed)
[name] tolerated Injectafer infusion well without complaints or incident. VSS upon discharge. Pt discharged self ambulatory using cane in satisfactory condition accompanied by family member

## 2017-06-09 ENCOUNTER — Other Ambulatory Visit: Payer: Self-pay | Admitting: Cardiology

## 2017-06-17 ENCOUNTER — Other Ambulatory Visit: Payer: Self-pay | Admitting: Cardiology

## 2017-07-07 ENCOUNTER — Inpatient Hospital Stay (HOSPITAL_COMMUNITY): Payer: Medicare Other | Attending: Internal Medicine

## 2017-07-07 ENCOUNTER — Other Ambulatory Visit: Payer: Self-pay | Admitting: Cardiology

## 2017-07-07 DIAGNOSIS — R634 Abnormal weight loss: Secondary | ICD-10-CM | POA: Diagnosis not present

## 2017-07-07 DIAGNOSIS — N189 Chronic kidney disease, unspecified: Secondary | ICD-10-CM | POA: Insufficient documentation

## 2017-07-07 DIAGNOSIS — D508 Other iron deficiency anemias: Secondary | ICD-10-CM

## 2017-07-07 DIAGNOSIS — M7989 Other specified soft tissue disorders: Secondary | ICD-10-CM | POA: Diagnosis not present

## 2017-07-07 DIAGNOSIS — D631 Anemia in chronic kidney disease: Secondary | ICD-10-CM | POA: Insufficient documentation

## 2017-07-07 LAB — COMPREHENSIVE METABOLIC PANEL
ALK PHOS: 89 U/L (ref 38–126)
ALT: 51 U/L (ref 14–54)
AST: 43 U/L — AB (ref 15–41)
Albumin: 3.6 g/dL (ref 3.5–5.0)
Anion gap: 9 (ref 5–15)
BUN: 25 mg/dL — AB (ref 6–20)
CALCIUM: 8.9 mg/dL (ref 8.9–10.3)
CHLORIDE: 107 mmol/L (ref 101–111)
CO2: 23 mmol/L (ref 22–32)
CREATININE: 1.46 mg/dL — AB (ref 0.44–1.00)
GFR calc non Af Amer: 31 mL/min — ABNORMAL LOW (ref >60)
GFR, EST AFRICAN AMERICAN: 36 mL/min — AB (ref >60)
GLUCOSE: 130 mg/dL — AB (ref 65–99)
Potassium: 4.9 mmol/L (ref 3.5–5.1)
SODIUM: 139 mmol/L (ref 135–145)
Total Bilirubin: 0.5 mg/dL (ref 0.3–1.2)
Total Protein: 7 g/dL (ref 6.5–8.1)

## 2017-07-07 LAB — CBC WITH DIFFERENTIAL/PLATELET
BASOS PCT: 0 %
Basophils Absolute: 0 10*3/uL (ref 0.0–0.1)
EOS PCT: 1 %
Eosinophils Absolute: 0 10*3/uL (ref 0.0–0.7)
HCT: 34.2 % — ABNORMAL LOW (ref 36.0–46.0)
Hemoglobin: 10.9 g/dL — ABNORMAL LOW (ref 12.0–15.0)
LYMPHS ABS: 1 10*3/uL (ref 0.7–4.0)
Lymphocytes Relative: 31 %
MCH: 30.4 pg (ref 26.0–34.0)
MCHC: 31.9 g/dL (ref 30.0–36.0)
MCV: 95.3 fL (ref 78.0–100.0)
MONO ABS: 0.2 10*3/uL (ref 0.1–1.0)
Monocytes Relative: 7 %
NEUTROS PCT: 61 %
Neutro Abs: 2 10*3/uL (ref 1.7–7.7)
PLATELETS: 164 10*3/uL (ref 150–400)
RBC: 3.59 MIL/uL — ABNORMAL LOW (ref 3.87–5.11)
RDW: 18.8 % — AB (ref 11.5–15.5)
WBC: 3.2 10*3/uL — ABNORMAL LOW (ref 4.0–10.5)

## 2017-07-07 LAB — FERRITIN: Ferritin: 566 ng/mL — ABNORMAL HIGH (ref 11–307)

## 2017-07-07 LAB — IRON AND TIBC
IRON: 98 ug/dL (ref 28–170)
Saturation Ratios: 33 % — ABNORMAL HIGH (ref 10.4–31.8)
TIBC: 297 ug/dL (ref 250–450)
UIBC: 199 ug/dL

## 2017-07-08 LAB — ERYTHROPOIETIN: Erythropoietin: 6.5 m[IU]/mL (ref 2.6–18.5)

## 2017-07-13 ENCOUNTER — Other Ambulatory Visit: Payer: Self-pay

## 2017-07-13 ENCOUNTER — Encounter (HOSPITAL_COMMUNITY): Payer: Self-pay | Admitting: Oncology

## 2017-07-13 ENCOUNTER — Inpatient Hospital Stay (HOSPITAL_BASED_OUTPATIENT_CLINIC_OR_DEPARTMENT_OTHER): Payer: Medicare Other | Admitting: Oncology

## 2017-07-13 VITALS — BP 188/75 | HR 62 | Temp 97.8°F | Resp 16 | Wt 123.0 lb

## 2017-07-13 DIAGNOSIS — R634 Abnormal weight loss: Secondary | ICD-10-CM | POA: Diagnosis not present

## 2017-07-13 DIAGNOSIS — M7989 Other specified soft tissue disorders: Secondary | ICD-10-CM | POA: Diagnosis not present

## 2017-07-13 DIAGNOSIS — D508 Other iron deficiency anemias: Secondary | ICD-10-CM

## 2017-07-13 DIAGNOSIS — N189 Chronic kidney disease, unspecified: Secondary | ICD-10-CM | POA: Diagnosis not present

## 2017-07-13 DIAGNOSIS — D631 Anemia in chronic kidney disease: Secondary | ICD-10-CM | POA: Diagnosis not present

## 2017-07-13 NOTE — Patient Instructions (Signed)
[facility] at [facility] Discharge Instructions  RECOMMENDATIONS MADE BY THE CONSULTANT AND ANY TEST RESULTS WILL BE SENT TO YOUR REFERRING PHYSICIAN.  You were seen today by Jenny Burns, NP  Thank you for choosing [facility] at [facility] to provide your oncology and hematology care.  To afford each patient quality time with our provider, please arrive at least 15 minutes before your scheduled appointment time.    If you have a lab appointment with the Cancer Center please come in thru the  Main Entrance and check in at the main information desk  You need to re-schedule your appointment should you arrive 10 or more minutes late.  We strive to give you quality time with our providers, and arriving late affects you and other patients whose appointments are after yours.  Also, if you no show three or more times for appointments you may be dismissed from the clinic at the providers discretion.     Again, thank you for choosing [facility].  Our hope is that these requests will decrease the amount of time that you wait before being seen by our physicians.       _____________________________________________________________  Should you have questions after your visit to [facility], please contact our office at [phone] between the hours of 8:30 a.m. and 4:30 p.m.  Voicemails left after 4:30 p.m. will not be returned until the following business day.  For prescription refill requests, have your pharmacy contact our office.       Resources For Cancer Patients and their Caregivers ? American Cancer Society: Can assist with transportation, wigs, general needs, runs Look Good Feel Better.        1-888-227-6333 ? Cancer Care: Provides financial assistance, online support groups, medication/co-pay assistance.  1-800-813-HOPE (4673) ? [facility] Assists [place] cancer patients and their  families through emotional , educational and financial support.  336-427-4357 ? [place] DSS Where to apply for food stamps, Medicaid and utility assistance. 336-342-1394 ? RCATS: Transportation to medical appointments. 336-347-2287 ? Social Security Administration: May apply for disability if have a Stage IV cancer. 336-342-7796 1-800-772-1213 ? [place] Aging, Disability and Transit Services: Assists with nutrition, care and transit needs. 336-349-2343  Cancer Center Support Programs: @10RELATIVEDAYS@ > Cancer Support Group  2nd Tuesday of the month 1pm-2pm, Journey Room  > Creative Journey  3rd Tuesday of the month 1130am-1pm, Journey Room  > Look Good Feel Better  1st Wednesday of the month 10am-12 noon, Journey Room (Call American Cancer Society to register 1-800-395-5775)    

## 2017-07-13 NOTE — Progress Notes (Signed)
[facility] Initial Visit:  Patient Care Team: Lemmie Evens, MD as PCP - General (Family Medicine) Danie Binder, MD as Consulting Physician (Gastroenterology) Lemmie Evens, MD (Family Medicine)  CHIEF COMPLAINTS/PURPOSE OF CONSULTATION:  Iron deficiency anemia and weight loss  HISTORY OF PRESENTING ILLNESS: Patient presents today with her daughter for evaluation of iron deficiency anemia and history of abnormal weight loss. Patient was noted to have a 30 pound unintentional weight loss since June 2017. She noted her appetite to be well and very nutritious. She lives at home alone. Patient had weight loss workup including a CT abdomen and pelvis without contrast indicating increased attenuation in the liver of unknown significance, chronic calcification of left adrenal gland, minimal right lower lobe infiltrate with no other abnormalities. Chest x-ray performed on 04/19/2017 showed no active cardiopulmonary disease. Abdominal ultrasound performed on 04/19/2017 was negative for gallstones, no ductal dilatation, and normal-appearing liver. Blood work appeared stable.  Iron panel from 04/29/2017 showed a ferritin of 10 with saturations of 7%. Hemoglobin 8.7. Patient was given 2 doses of IV injectafer on 05/27/2017 and 06/03/2017. She tolerated these well without incident.  Additionally, she was evaluated by dietary indicating an adequate oral intake related to dysphagia, death of her spouse of 77 years and anemia.recommendations were offered to patient and daughter. She was scheduled for repeat visit in January during second infusion of iron.  INTERVAL HISTORY: Patient presents today with daughter for continued follow-up and review of repeat iron studies and CBC. Patient notes to feel "much better" since IV iron in January. Her appetite is 100% and energy levels are 50%. She denies any pain. She continues to have weakness but this is much improved. She notes occasional  leg swelling but this is chronic and she takes a "fluid pill" when they are swollen. She notes dizziness with an elevated blood pressure but otherwise offers no complaints. Weight today is stable from visit in December. She denies any chest pain, shortness of breath, abdominal pain, nausea, vomiting, diarrhea. She denies any bleeding including melena, hematochezia or hematuria.  They are here today for lab results.   Review of Systems  Constitutional: Positive for fatigue (chronic). Negative for appetite change (continues to be good).  HENT:   Positive for hearing loss (chronic). Negative for nosebleeds.   Eyes: Negative.   Respiratory: Negative.  Negative for cough, hemoptysis, shortness of breath and wheezing.   Cardiovascular: Positive for leg swelling (bilateral lower extremities. Takes "fluid pill" when needed). Negative for chest pain.  Gastrointestinal: Positive for constipation (patient takes a laxative twice a week prescribed by PCP). Negative for blood in stool, diarrhea, nausea, rectal pain and vomiting.  Endocrine: Negative.   Genitourinary: Negative.  Negative for bladder incontinence, dysuria and frequency.   Musculoskeletal: Negative for myalgias.  Skin: Negative.  Negative for itching and rash.  Neurological: Positive for dizziness (with elevated blood pressure).  Hematological: Negative.  Does not bruise/bleed easily.  Psychiatric/Behavioral: Negative.  Negative for confusion. The patient is not nervous/anxious.    ROS as per HPI otherwise 12 point ROS is negative.  MEDICAL HISTORY: Past Medical History:  Diagnosis Date  . Anxiety   . Atrial fibrillation ([place])   . COPD (chronic obstructive pulmonary disease) ([place])   . Depression   . Hyperlipidemia   . Hypertension   . Hypothyroidism   . Mitral regurgitation    Mild  . Palpitations    PAT/EAT/NSVT, normal LVEF  . Type 2 diabetes mellitus ([place])  SURGICAL HISTORY: Past Surgical History:  Procedure Laterality  Date  . 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 hiatal hernia. Antral erosions status post gastric biopsy.  . ESOPHAGOGASTRODUODENOSCOPY N/A 05/12/2016   Procedure: ESOPHAGOGASTRODUODENOSCOPY (EGD);  Surgeon: Daneil Dolin, MD;  Location[facility];  Service: Endoscopy;  Laterality: N/A;  730   . MALONEY DILATION N/A 04/17/2014   Procedure: Venia Minks DILATION;  Surgeon: Daneil Dolin, MD;  Location: [facility];  Service: Endoscopy;  Laterality: N/A;  Venia Minks DILATION N/A 05/12/2016   Procedure: Venia Minks DILATION;  Surgeon: Daneil Dolin, MD;  Location: [facility];  Service: Endoscopy;  Laterality: N/A;  . none      SOCIAL HISTORY: Social History   Socioeconomic History  . Marital status: Married    Spouse name: Not on file  . Number of children: Not on file  . Years of education: 10  . Highest education level: Not on file  Social Needs  . Financial resource strain: Not on file  . Food insecurity - worry: Not on file  . Food insecurity - inability: Not on file  . Transportation needs - medical: Not on file  . Transportation needs - non-medical: Not on file  Occupational History  . Occupation: Retired    Fish farm manager: RETIRED  Tobacco Use  . Smoking status: Never Smoker  . Smokeless tobacco: Never Used  Substance and Sexual Activity  . Alcohol use: No    Alcohol/week: 0.0 oz  . Drug use: No  . Sexual activity: No  Other Topics Concern  . Not on file  Social History Narrative  . Not on file    FAMILY HISTORY Family History  Problem Relation Age of Onset  . Heart failure Mother        Died in her 43s  . Tuberculosis Father        Died at young age  . Cancer Brother   . Arthritis Unknown   . Colon cancer Unknown     ALLERGIES:  is allergic to chocolate.  MEDICATIONS:  Current Outpatient Medications  Medication Sig Dispense Refill  .  acetaminophen (TYLENOL) 650 MG CR tablet Take 650 mg by mouth as needed for pain.    Marland Kitchen ALPRAZolam (XANAX) 0.25 MG tablet Take 0.25 mg by mouth daily as needed for anxiety or sleep.     Marland Kitchen amiodarone (PACERONE) 200 MG tablet TAKE ONE TABLET BY MOUTH EVERY MORNING 90 tablet 1  . aspirin buffered (BUFFERIN) 325 MG TABS tablet Take 325 mg by mouth daily as needed (pain).    Marland Kitchen atorvastatin (LIPITOR) 40 MG tablet Take 40 mg by mouth daily at 6 PM.    . bismuth subsalicylate (PEPTO BISMOL) 262 MG/15ML suspension Take 30 mLs by mouth daily as needed for indigestion.    . cetirizine (ZYRTEC) 10 MG tablet Take 5-10 mg by mouth daily as needed for allergies or rhinitis.     . cloNIDine (CATAPRES) 0.1 MG tablet TAKE ONE TABLET BY MOUTH EVERY EVENING 30 tablet 6  . COD LIVER OIL PO Take 15 mLs by mouth daily.    Marland Kitchen ELIQUIS 2.5 MG TABS tablet TAKE ONE TABLET BY MOUTH TWICE DAILY 60 tablet 3  . ferrous sulfate 325 (65 FE) MG tablet Take 325 mg by mouth daily with breakfast.    . furosemide (LASIX) 20 MG tablet TAKE 1 TABLET BY MOUTH EVERY DAY AS NEEDED FOR FLUID  30 tablet 6  . glimepiride (AMARYL) 1 MG tablet Take 1 mg by mouth daily before breakfast.     . guaiFENesin-dextromethorphan (ROBITUSSIN DM) 100-10 MG/5ML syrup Take 5 mLs by mouth every 4 (four) hours as needed for cough (SUGAR FREE).    Marland Kitchen levothyroxine (SYNTHROID, LEVOTHROID) 25 MCG tablet Take 25 mcg by mouth daily before breakfast.    . lisinopril (PRINIVIL,ZESTRIL) 20 MG tablet TAKE ONE TABLET BY MOUTH TWICE DAILY 180 tablet 3  . metoprolol succinate (TOPROL-XL) 50 MG 24 hr tablet TAKE ONE TABLET BY MOUTH EVERY MORNING immediately following a meal. 90 tablet 3  . omeprazole (PRILOSEC) 20 MG capsule TAKE 1 CAPSULE BY MOUTH EVERY DAY 30 capsule 5  . polyethylene glycol (MIRALAX / GLYCOLAX) packet Take 17 g 2 (two) times a week by mouth.    . potassium chloride (MICRO-K) 10 MEQ CR capsule TAKE TWO CAPSULES BY MOUTH DAILY FOR low potassium  5  .  spironolactone (ALDACTONE) 25 MG tablet TAKE ONE TABLET BY MOUTH EVERY MORNING 30 tablet 6  . umeclidinium bromide (INCRUSE ELLIPTA) 62.5 MCG/INH AEPB Inhale 1 puff into the lungs daily.     No current facility-administered medications for this visit.     PHYSICAL EXAMINATION:  ECOG PERFORMANCE STATUS: 1 - Symptomatic but completely ambulatory   Vitals:   07/13/17 1535  BP: (!) 188/75  Pulse: 62  Resp: 16  Temp: 97.8 F (36.6 C)  SpO2: 100%    Filed Weights   07/13/17 1535  Weight: 123 lb (55.8 kg)     Physical Exam  Constitutional: She is oriented to person, place, and time and well-developed, well-nourished, and in no distress. Vital signs are normal.  HENT:  Head: Normocephalic and atraumatic.  Eyes: Pupils are equal, round, and reactive to light.  Neck: Normal range of motion.  Cardiovascular: Normal rate, regular rhythm and normal heart sounds.  No murmur heard. Pulmonary/Chest: Effort normal and breath sounds normal. She has no wheezes.  Abdominal: Soft. Normal appearance and bowel sounds are normal. She exhibits no distension. There is no tenderness.  Musculoskeletal: Normal range of motion. She exhibits no edema.       Right ankle: She exhibits swelling.       Left ankle: She exhibits swelling.  Nonpitting edema  Neurological: She is alert and oriented to person, place, and time. Gait normal.  Skin: Skin is warm and dry. No rash noted.  Psychiatric: Mood, memory, affect and judgment normal.   Constitutional: Well-developed, well-nourished, and in no distress.   HENT:  Head: Normocephalic and atraumatic.  Mouth/Throat: No oropharyngeal exudate. Mucosa moist. Eyes: Pupils are equal, round, and reactive to light. Conjunctivae are normal. No scleral icterus.  Neck: Normal range of motion. Neck supple. No JVD present.  Cardiovascular: Normal rate, regular rhythm and normal heart sounds.  Exam reveals no gallop and no friction rub.   No murmur  heard. Pulmonary/Chest: Effort normal and breath sounds normal. No respiratory distress. No wheezes.No rales.  Abdominal: Soft. Bowel sounds are normal. No distension. There is no tenderness. There is no guarding.  Musculoskeletal: No tenderness. 1+ trace edema. Lymphadenopathy:    No cervical or supraclavicular adenopathy.  Neurological: Alert and oriented to person, place, and time. No cranial nerve deficit.  Skin: Skin is warm and dry. No rash noted. No erythema. No pallor.  Psychiatric: Affect and judgment normal.    LABORATORY DATA: I have personally reviewed the data as listed:  Appointment on 07/07/2017  Component Date Value  Ref Range Status  . Erythropoietin 07/07/2017 6.5  2.6 - 18.5 mIU/mL Final   Comment: (NOTE) [facility] [employer] UniCel DxI [place] obtained with different assay methods or kits cannot be used interchangeably. Results cannot be interpreted as absolute evidence of the presence or absence of malignant disease. Performed At: [facility] [place], [place] [id number] Rush Farmer MD [id number] Performed at [facility], [address] [place], [place] [address]   . Ferritin 07/07/2017 566* 11 - 307 ng/mL Final   Performed at [facility], [place] [address], [place], [place] [address]  . Iron 07/07/2017 98  28 - 170 ug/dL Final  . TIBC 07/07/2017 297  250 - 450 ug/dL Final  . Saturation Ratios 07/07/2017 33* 10.4 - 31.8 % Final  . UIBC 07/07/2017 199  ug/dL Final   Performed at [facility], [place] [address] [place], [place] [address]  . Sodium 07/07/2017 139  135 - 145 mmol/L Final  . Potassium 07/07/2017 4.9  3.5 - 5.1 mmol/L Final  . Chloride 07/07/2017 107  101 - 111 mmol/L Final  . CO2 07/07/2017 23  22 - 32 mmol/L Final  . Glucose, Bld 07/07/2017 130* 65 - 99 mg/dL Final  . BUN 07/07/2017 25* 6 - 20 mg/dL Final  . Creatinine, Ser 07/07/2017 1.46* 0.44 - 1.00 mg/dL Final  . Calcium 07/07/2017 8.9   8.9 - 10.3 mg/dL Final  . Total Protein 07/07/2017 7.0  6.5 - 8.1 g/dL Final  . Albumin 07/07/2017 3.6  3.5 - 5.0 g/dL Final  . AST 07/07/2017 43* 15 - 41 U/L Final  . ALT 07/07/2017 51  14 - 54 U/L Final  . Alkaline Phosphatase 07/07/2017 89  38 - 126 U/L Final  . Total Bilirubin 07/07/2017 0.5  0.3 - 1.2 mg/dL Final  . GFR calc non Af Amer 07/07/2017 31* >60 mL/min Final  . GFR calc Af Amer 07/07/2017 36* >60 mL/min Final   Comment: (NOTE) The eGFR has been calculated using the CKD EPI equation. This calculation has not been validated in all clinical situations. eGFR's persistently <60 mL/min signify possible Chronic Kidney Disease.   Georgiann Hahn gap 07/07/2017 9  5 - 15 Final   Performed at [facility], [address], [place], [place] [address]  . WBC 07/07/2017 3.2* 4.0 - 10.5 K/uL Final  . RBC 07/07/2017 3.59* 3.87 - 5.11 MIL/uL Final  . Hemoglobin 07/07/2017 10.9* 12.0 - 15.0 g/dL Final  . HCT 07/07/2017 34.2* 36.0 - 46.0 % Final  . MCV 07/07/2017 95.3  78.0 - 100.0 fL Final  . MCH 07/07/2017 30.4  26.0 - 34.0 pg Final  . MCHC 07/07/2017 31.9  30.0 - 36.0 g/dL Final  . RDW 07/07/2017 18.8* 11.5 - 15.5 % Final  . Platelets 07/07/2017 164  150 - 400 K/uL Final  . Neutrophils Relative % 07/07/2017 61  % Final  . Neutro Abs 07/07/2017 2.0  1.7 - 7.7 K/uL Final  . Lymphocytes Relative 07/07/2017 31  % Final  . Lymphs Abs 07/07/2017 1.0  0.7 - 4.0 K/uL Final  . Monocytes Relative 07/07/2017 7  % Final  . Monocytes Absolute 07/07/2017 0.2  0.1 - 1.0 K/uL Final  . Eosinophils Relative 07/07/2017 1  % Final  . Eosinophils Absolute 07/07/2017 0.0  0.0 - 0.7 K/uL Final  . Basophils Relative 07/07/2017 0  % Final  . Basophils Absolute 07/07/2017 0.0  0.0 - 0.1 K/uL Final   Performed  at [facility], [address], [place], [place] [address]    RADIOGRAPHIC STUDIES: I have personally reviewed the radiological images as listed and agree with the findings in the report  No results  found.  ASSESSMENT/PLAN 1. Abnormal unintentional weight loss. -Patient's weight is stable at this time at 124 pounds. Previous workup for weight loss indicated a low suspicion for malignancy given recent negative imaging including chest x-ray, abdominal ultrasound and CT abdomen/pelvis. - Continue to follow-up with nutrition and dietary as needed.  2. Anemia in the setting of chronic kidney disease - hemoglobin stable today at 10.9. Previous 8 range. - previous anemia workup was negative for monoclonal protein and did not show an apparent polyclonal gammopathy. Folate and B12 were within normal limits. - today's iron/anemia profile indicated iron saturations at 33% and a ferritin at 566 (falsely elevated due to chronic kidney disease). - ESA therapy was discussed if hemoglobin did not improve with IV iron. She will not need at this time given her hemoglobin is 10.9 today. Will continue to monitor.  Dispo:  RTC in 2 months for labs and MD assessment.  No orders of the defined types were placed in this encounter.  Greater than 50% was spent in counseling and coordination of care with this patient including but not limited to discussion of the relevant topics above (See A&P) including, but not limited to diagnosis and management of acute and chronic medical conditions.   All questions were answered. The patient knows to call the clinic with any problems, questions or concerns.  This note was electronically signed.    Jacquelin Hawking, NP  07/14/2017 6:02 AM

## 2017-08-07 ENCOUNTER — Other Ambulatory Visit: Payer: Self-pay | Admitting: Cardiology

## 2017-08-17 NOTE — Progress Notes (Signed)
Cardiology Office Note  Date: 08/18/2017   ID: [name], DOB August 05, 1926, MRN [id number]  PCP: Gareth Morgan, MD  Primary Cardiologist: Nona Dell, MD   Chief Complaint  Patient presents with  . PAF    History of Present Illness: [name] is an 84 y.o. female last seen in September 2018.  She is here today for a follow-up visit.  Looks somewhat better today, seems less anxious, states that she has been eating better as well.  She does not report any chest pain or palpitations, no falls or syncope.  I personally reviewed her ECG today which shows probable sinus bradycardia with lead artifact and left bundle branch block which is old.  I reviewed her cardiac medications which are outlined below.  We discussed reducing her amiodarone to 100 mg daily for now.  She continues to follow lab work with Dr. Sudie Bailey, I reviewed results from February.  Past Medical History:  Diagnosis Date  . Anxiety   . Atrial fibrillation (HCC)   . COPD (chronic obstructive pulmonary disease) (HCC)   . Depression   . Hyperlipidemia   . Hypertension   . Hypothyroidism   . Mitral regurgitation    Mild  . Palpitations    PAT/EAT/NSVT, normal LVEF  . Type 2 diabetes mellitus (HCC)     Past Surgical History:  Procedure Laterality Date  . 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 hiatal hernia. Antral erosions status post gastric biopsy.  . ESOPHAGOGASTRODUODENOSCOPY N/A 05/12/2016   Procedure: ESOPHAGOGASTRODUODENOSCOPY (EGD);  Surgeon: Corbin Ade, MD;  [facility];  Service: Endoscopy;  Laterality: N/A;  730   . MALONEY DILATION N/A 04/17/2014   Procedure: [name] DILATION;  Surgeon: Corbin Ade, MD;  [facility];  Service: Endoscopy;  Laterality: N/A;  [name] DILATION N/A 05/12/2016   Procedure: [name] DILATION;  Surgeon: Corbin Ade, MD;  [facility];  Service: Endoscopy;  Laterality: N/A;  . none      Current Outpatient Medications  Medication Sig Dispense Refill  . acetaminophen (TYLENOL) 650 MG CR tablet Take 650 mg by mouth as needed for pain.    Marland Kitchen ALPRAZolam (XANAX) 0.25 MG tablet Take 0.25 mg by mouth daily as needed for anxiety or sleep.     Marland Kitchen atorvastatin (LIPITOR) 40 MG tablet Take 40 mg by mouth daily at 6 PM.    . bismuth subsalicylate (PEPTO BISMOL) 262 MG/15ML suspension Take 30 mLs by mouth daily as needed for indigestion.    . cetirizine (ZYRTEC) 10 MG tablet Take 5-10 mg by mouth daily as needed for allergies or rhinitis.     . cloNIDine (CATAPRES) 0.1 MG tablet TAKE ONE TABLET BY MOUTH EVERY EVENING 30 tablet 6  . COD LIVER OIL PO Take 15 mLs by mouth daily.    Marland Kitchen ELIQUIS 2.5 MG TABS tablet TAKE ONE TABLET BY MOUTH TWICE DAILY 60 tablet 3  . furosemide (LASIX) 20 MG tablet TAKE 1 TABLET BY MOUTH EVERY DAY AS NEEDED FOR FLUID 30 tablet 6  . glimepiride (AMARYL) 1 MG tablet Take 1 mg by mouth daily before breakfast.     . guaiFENesin-dextromethorphan (ROBITUSSIN DM) 100-10 MG/5ML syrup Take 5 mLs by mouth every 4 (four) hours as needed for cough (SUGAR FREE).    Marland Kitchen levothyroxine (SYNTHROID, LEVOTHROID) 25 MCG tablet Take 25 mcg by mouth daily before breakfast.    .  lisinopril (PRINIVIL,ZESTRIL) 20 MG tablet TAKE ONE TABLET BY MOUTH TWICE DAILY 180 tablet 3  . metoprolol succinate (TOPROL-XL) 50 MG 24 hr tablet TAKE ONE TABLET BY MOUTH EVERY MORNING immediately following a meal. 90 tablet 3  . omeprazole (PRILOSEC) 20 MG capsule TAKE 1 CAPSULE BY MOUTH EVERY DAY 30 capsule 5  . polyethylene glycol (MIRALAX / GLYCOLAX) packet Take 17 g 2 (two) times a week by mouth.    . potassium chloride (MICRO-K) 10 MEQ CR capsule TAKE TWO CAPSULES BY MOUTH DAILY FOR low potassium  5  . spironolactone (ALDACTONE) 25 MG tablet TAKE ONE TABLET BY MOUTH EVERY MORNING 30 tablet 6  . umeclidinium bromide  (INCRUSE ELLIPTA) 62.5 MCG/INH AEPB Inhale 1 puff into the lungs daily.    Marland Kitchen amiodarone (PACERONE) 100 MG tablet Take 1 tablet (100 mg total) by mouth daily. 30 tablet 6   No current facility-administered medications for this visit.    Allergies:  Chocolate   Social History: The patient  reports that she has never smoked. She has never used smokeless tobacco. She reports that she does not drink alcohol or use drugs.   ROS:  Please see the history of present illness. Otherwise, complete review of systems is positive for hearing loss.  All other systems are reviewed and negative.   Physical Exam: VS:  BP (!) 146/62   Pulse 61   Ht 5\' 6"  (1.676 m)   Wt 127 lb (57.6 kg)   SpO2 98%   BMI 20.50 kg/m , BMI Body mass index is 20.5 kg/m.  Wt Readings from Last 3 Encounters:  08/18/17 127 lb (57.6 kg)  07/13/17 123 lb (55.8 kg)  05/27/17 132 lb (59.9 kg)    General: Elderly woman, no distress.  Uses cane. HEENT: Conjunctiva and lids normal, oropharynx clear. Neck: Supple, no elevated JVP or carotid bruits, no thyromegaly. Lungs: Clear to auscultation, nonlabored breathing at rest. Cardiac: Regular rate and rhythm, no S3, soft systolic murmur. Abdomen: Soft, nontender, bowel sounds present. Extremities: Mild lower leg edema, distal pulses 2+. Skin: Warm and dry. Musculoskeletal: No kyphosis. Neuropsychiatric: Alert and oriented x3, affect grossly appropriate.  ECG: I personally reviewed the tracing from 06/10/2016 which showed sinus rhythm with left bundle branch block.  Recent Labwork: 07/07/2017: ALT 51; AST 43; BUN 25; Creatinine, Ser 1.46; Hemoglobin 10.9; Platelets 164; Potassium 4.9; Sodium 139     Component Value Date/Time   CHOL 142 07/14/2014 0456   TRIG 32 07/14/2014 0456   HDL 58 07/14/2014 0456   CHOLHDL 2.4 07/14/2014 0456   VLDL 6 07/14/2014 0456   LDLCALC 78 07/14/2014 0456    Other Studies Reviewed Today:  Echocardiogram 07/14/2014: Study Conclusions  - Left  ventricle: The cavity size was normal. Systolic function was normal. The estimated ejection fraction was in the range of 50% to 55%. Wall motion was normal; there were no regional wall motion abnormalities. There was a reduced contribution of atrial contraction to ventricular filling, due to increased ventricular diastolic pressure or atrial contractile dysfunction. Doppler parameters are consistent with a reversible restrictive pattern, indicative of decreased left ventricular diastolic compliance and/or increased left atrial pressure (grade 3 diastolic dysfunction). - Ventricular septum: Septal motion showed paradox. These changes are consistent with intraventricular conduction delay. - Aortic valve: Trileaflet; normal thickness, mildly calcified leaflets. - Mitral valve: There was moderate regurgitation. - Left atrium: The atrium was mildly to moderately dilated. - Tricuspid valve: There was moderate regurgitation. - Pulmonary arteries: PA peak pressure:  34 mm Hg (S).  Assessment and Plan:  1.  Paroxysmal atrial fibrillation.  She reports good control with no palpitations and her ECG shows sinus bradycardia today with old left bundle branch block.  We will continue with current regimen except reduce amiodarone to 100 mg daily for now.  Keep follow-up lab work with Dr. Sudie BaileyKnowlton.  2.  Essential hypertension, no changes made to current medications.  3.  Mitral regurgitation, moderate by last assessment.  No change on examination.  Current medicines were reviewed with the patient today.   Orders Placed This Encounter  Procedures  . EKG 12-Lead    Disposition: Follow-up in 6 months.  Signed, Jonelle SidleSamuel G. McDowell, MD, [facility] CenterFACC 08/18/2017 10:05 AM    [facility] [address], [place], [place] [address] Phone: [phone]; Fax: [phone]

## 2017-08-18 ENCOUNTER — Encounter: Payer: Self-pay | Admitting: Cardiology

## 2017-08-18 ENCOUNTER — Ambulatory Visit: Payer: Medicare Other | Admitting: Cardiology

## 2017-08-18 VITALS — BP 146/62 | HR 61 | Ht 66.0 in | Wt 127.0 lb

## 2017-08-18 DIAGNOSIS — I10 Essential (primary) hypertension: Secondary | ICD-10-CM

## 2017-08-18 DIAGNOSIS — I340 Nonrheumatic mitral (valve) insufficiency: Secondary | ICD-10-CM | POA: Diagnosis not present

## 2017-08-18 DIAGNOSIS — I480 Paroxysmal atrial fibrillation: Secondary | ICD-10-CM

## 2017-08-18 MED ORDER — AMIODARONE HCL 100 MG PO TABS: 100.0000 mg | ORAL_TABLET | Freq: Every day | ORAL | 6 refills | Status: DC

## 2017-08-18 NOTE — Patient Instructions (Addendum)
Medication Instructions:  Your physician has recommended you make the following change in your medication:    DECREASE Amiodarone to 100 mg daily   Please continue all other medications as prescribed  Labwork: NONE  Testing/Procedures: NONE  Follow-Up: Your physician wants you to follow-up in: 6 MONTHS WITH DR. MCDOWELL. You will receive a reminder letter in the mail two months in advance. If you don't receive a letter, please call our office to schedule the follow-up appointment.  Any Other Special Instructions Will Be Listed Below (If Applicable).  If you need a refill on your cardiac medications before your next appointment, please call your pharmacy.

## 2017-09-13 ENCOUNTER — Other Ambulatory Visit (HOSPITAL_COMMUNITY): Payer: Self-pay

## 2017-09-13 DIAGNOSIS — D508 Other iron deficiency anemias: Secondary | ICD-10-CM

## 2017-09-13 DIAGNOSIS — N189 Chronic kidney disease, unspecified: Secondary | ICD-10-CM

## 2017-09-13 DIAGNOSIS — D631 Anemia in chronic kidney disease: Secondary | ICD-10-CM

## 2017-09-14 ENCOUNTER — Encounter (HOSPITAL_COMMUNITY): Payer: Self-pay | Admitting: Internal Medicine

## 2017-09-14 ENCOUNTER — Inpatient Hospital Stay (HOSPITAL_COMMUNITY): Payer: Medicare Other | Attending: Hematology | Admitting: Internal Medicine

## 2017-09-14 ENCOUNTER — Inpatient Hospital Stay (HOSPITAL_COMMUNITY): Payer: Medicare Other

## 2017-09-14 VITALS — BP 192/67 | HR 67 | Temp 97.7°F | Resp 18 | Wt 125.0 lb

## 2017-09-14 DIAGNOSIS — E039 Hypothyroidism, unspecified: Secondary | ICD-10-CM | POA: Diagnosis not present

## 2017-09-14 DIAGNOSIS — N189 Chronic kidney disease, unspecified: Secondary | ICD-10-CM

## 2017-09-14 DIAGNOSIS — I10 Essential (primary) hypertension: Secondary | ICD-10-CM

## 2017-09-14 DIAGNOSIS — N289 Disorder of kidney and ureter, unspecified: Secondary | ICD-10-CM

## 2017-09-14 DIAGNOSIS — D500 Iron deficiency anemia secondary to blood loss (chronic): Secondary | ICD-10-CM

## 2017-09-14 DIAGNOSIS — D509 Iron deficiency anemia, unspecified: Secondary | ICD-10-CM | POA: Diagnosis not present

## 2017-09-14 DIAGNOSIS — K5900 Constipation, unspecified: Secondary | ICD-10-CM

## 2017-09-14 DIAGNOSIS — D631 Anemia in chronic kidney disease: Secondary | ICD-10-CM

## 2017-09-14 DIAGNOSIS — D508 Other iron deficiency anemias: Secondary | ICD-10-CM

## 2017-09-14 LAB — CBC WITH DIFFERENTIAL/PLATELET
BASOS ABS: 0 10*3/uL (ref 0.0–0.1)
Basophils Relative: 1 %
EOS ABS: 0.1 10*3/uL (ref 0.0–0.7)
EOS PCT: 1 %
HCT: 33.7 % — ABNORMAL LOW (ref 36.0–46.0)
Hemoglobin: 10.6 g/dL — ABNORMAL LOW (ref 12.0–15.0)
LYMPHS PCT: 37 %
Lymphs Abs: 1.4 10*3/uL (ref 0.7–4.0)
MCH: 30.8 pg (ref 26.0–34.0)
MCHC: 31.5 g/dL (ref 30.0–36.0)
MCV: 98 fL (ref 78.0–100.0)
MONO ABS: 0.3 10*3/uL (ref 0.1–1.0)
Monocytes Relative: 8 %
Neutro Abs: 2 10*3/uL (ref 1.7–7.7)
Neutrophils Relative %: 53 %
PLATELETS: 220 10*3/uL (ref 150–400)
RBC: 3.44 MIL/uL — ABNORMAL LOW (ref 3.87–5.11)
RDW: 14.7 % (ref 11.5–15.5)
WBC: 3.7 10*3/uL — ABNORMAL LOW (ref 4.0–10.5)

## 2017-09-14 LAB — COMPREHENSIVE METABOLIC PANEL
ALT: 25 U/L (ref 14–54)
AST: 28 U/L (ref 15–41)
Albumin: 3.8 g/dL (ref 3.5–5.0)
Alkaline Phosphatase: 72 U/L (ref 38–126)
Anion gap: 6 (ref 5–15)
BUN: 24 mg/dL — AB (ref 6–20)
CHLORIDE: 109 mmol/L (ref 101–111)
CO2: 22 mmol/L (ref 22–32)
Calcium: 9 mg/dL (ref 8.9–10.3)
Creatinine, Ser: 1.22 mg/dL — ABNORMAL HIGH (ref 0.44–1.00)
GFR, EST AFRICAN AMERICAN: 44 mL/min — AB (ref >60)
GFR, EST NON AFRICAN AMERICAN: 38 mL/min — AB (ref >60)
Glucose, Bld: 92 mg/dL (ref 65–99)
POTASSIUM: 4 mmol/L (ref 3.5–5.1)
Sodium: 137 mmol/L (ref 135–145)
Total Bilirubin: 0.9 mg/dL (ref 0.3–1.2)
Total Protein: 7.2 g/dL (ref 6.5–8.1)

## 2017-09-14 LAB — IRON AND TIBC
IRON: 66 ug/dL (ref 28–170)
Saturation Ratios: 22 % (ref 10.4–31.8)
TIBC: 305 ug/dL (ref 250–450)
UIBC: 239 ug/dL

## 2017-09-14 LAB — FERRITIN: FERRITIN: 237 ng/mL (ref 11–307)

## 2017-09-14 NOTE — Progress Notes (Signed)
Diagnosis Iron deficiency anemia due to chronic blood loss - Plan: CBC with Differential/Platelet, Comprehensive metabolic panel, Lactate dehydrogenase, Ferritin  Staging Cancer Staging No matching staging information was found for the patient.  Assessment and Plan:  1.  Iron deficiency anemia.  Patient was last treated with IV iron in January 2019.  Labs done 09/14/2017 show a white count 3.7 hemoglobin 10.6 platelets 220,000.  Patient had a response to IV iron as hemoglobin has improved from 8.7-10.6.  She will return to clinic in 4 months for follow-up and repeat labs.  2.  Constipation.  She is followed by Dr. Oneida Alar.  Patient and her family member are not sure if she has ever gone endoscopic evaluation.  Stool softeners recommended.  Patient follow-up with Dr. Oneida Alar  for GI recommendation.  3.  HTN.  BP is 192/67.  Follow-up with PCP.    4.  Hypothyroidism.  Pt on synthroid.  Follow-up with PCP.    5.  RI.  Cr 1.22.  Will repeat chemistries on RTC.    Interval History:  84 y.o. female previously followed by Dr. Talbert Cage for abnormal weight loss.  Patient states she is lost about 30 pounds unintentionally since June 2017.  She states her appetite is very good and she cooks her own meals and they are very nutritious.  She lives alone and performs most of her own ADLs.  Her daughter does have a housekeeper that comes out to help with major cleaning for the patient.  Patient had a CT abdomen and pelvis without contrast performed on 02/02/2017 for evaluation for weight loss and was noted to have increased attenuation in the liver of unknown certain significance, chronic calcification of the left adrenal gland, minimal right lower lobe infiltrate, otherwise no acute abnormalities.  Patient had a chest x-ray performed on 04/19/2017 which showed no acute cardiopulmonary disease.  Patient also had a complete abdominal ultrasound performed on 04/19/2017 which was negative for gallstones, no ductal  dilatation, normal echogenicity in the liver parenchyma and no focal abnormalities were seen in the liver.  Her most recent blood work from 04/13/2017 demonstrated CMP with a creatinine of 1.58, BUN 34, and GFR 34, AST 38, ALT 39, hemoglobin A1c 5.3, sed rate 34.  CBC on 04/13/2017 demonstrated WBC 3.9K, hemoglobin 8.8 g/dL, hematocrit 28.2%, MCV 88.4, platelet count 214K, differential was normal.Pt had CT abdomen and pelvis done 02/02/2017 that showed  IMPRESSION: Increased attenuation in the liver is noted of uncertain significance. The possibility of an underlying deposition disorder could not be totally excluded.  Minimal right lower lobe infiltrate.  Chronic calcification of the left adrenal gland.  No acute abnormality is noted.  She was treated with IV iron on 05/27/2017 and 06/03/2017.    Current Status: Patient is seen today for follow-up.  When questioned she nor her family member are sure if she has never undergone colonoscopy or endoscopy.  She denies any blood in the stool or the urine.  She reports problems with constipation.  She is here to go over lab studies.  Problem List Patient Active Problem List   Diagnosis Date Noted  . Iron deficiency anemia [D50.9] 05/12/2017  . GERD (gastroesophageal reflux disease) [K21.9] 03/17/2016  . Constipation [K59.00] 10/29/2015  . Gastric erosions [K25.9] 02/03/2015  . Sinus bradycardia [R00.1] 09/26/2014  . Dyspnea on exertion [R06.09] 09/18/2014  . Chronic diastolic heart failure ([place]) [I50.32] 09/18/2014  . Dizziness [R42] 09/18/2014  . Atrial flutter with rapid ventricular response ([place]) [I48.92]   .  Atrial fibrillation with RVR ([place]) [I48.91] 09/09/2014  . Atrial flutter ([place]) [I48.92] 07/13/2014  . Hyponatremia [E87.1] 07/13/2014  . Diabetes ([place]) [E11.9] 07/13/2014  . Hypothyroidism [E03.9] 07/13/2014  . Tachycardia [R00.0] 07/13/2014  . Dysphagia, pharyngoesophageal phase [R13.14] 04/16/2014  . Neurogenic pain of lower  extremity [M79.2] 03/15/2012  . Left bundle branch block [I44.7] 03/02/2011  . RENAL INSUFFICIENCY [N25.9] 05/22/2010  . Essential hypertension, benign [I10] 10/09/2009  . Mitral regurgitation [I34.0] 10/09/2009  . HYPERLIPIDEMIA-MIXED [E78.5] 02/12/2009  . Palpitations [R00.2] 02/12/2009    Past Medical History Past Medical History:  Diagnosis Date  . Anxiety   . Atrial fibrillation ([place])   . COPD (chronic obstructive pulmonary disease) ([place])   . Depression   . Hyperlipidemia   . Hypertension   . Hypothyroidism   . Mitral regurgitation    Mild  . Palpitations    PAT/EAT/NSVT, normal LVEF  . Type 2 diabetes mellitus [facility])     Past Surgical History Past Surgical History:  Procedure Laterality Date  . 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 hiatal hernia. Antral erosions status post gastric biopsy.  . ESOPHAGOGASTRODUODENOSCOPY N/A 05/12/2016   Procedure: ESOPHAGOGASTRODUODENOSCOPY (EGD);  Surgeon: Daneil Dolin, MD;  [facility];  Service: Endoscopy;  Laterality: N/A;  730   . MALONEY DILATION N/A 04/17/2014   Procedure: Venia Minks DILATION;  Surgeon: Daneil Dolin, MD;  [facility];  Service: Endoscopy;  Laterality: N/A;  Venia Minks DILATION N/A 05/12/2016   Procedure: Venia Minks DILATION;  Surgeon: Daneil Dolin, MD;  [facility];  Service: Endoscopy;  Laterality: N/A;  . none      Family History Family History  Problem Relation Age of Onset  . Heart failure Mother        Died in her 29s  . Tuberculosis Father        Died at young age  . Cancer Brother   . Arthritis Unknown   . Colon cancer Unknown      Social History  reports that she has never smoked. She has never used smokeless tobacco. She reports that she does not drink alcohol or use drugs.  Medications  Current Outpatient Medications:  .  acetaminophen  (TYLENOL) 650 MG CR tablet, Take 650 mg by mouth as needed for pain., Disp: , Rfl:  .  ALPRAZolam (XANAX) 0.25 MG tablet, Take 0.25 mg by mouth daily as needed for anxiety or sleep. , Disp: , Rfl:  .  amiodarone (PACERONE) 100 MG tablet, Take 1 tablet (100 mg total) by mouth daily., Disp: 30 tablet, Rfl: 6 .  atorvastatin (LIPITOR) 40 MG tablet, Take 40 mg by mouth daily at 6 PM., Disp: , Rfl:  .  bismuth subsalicylate (PEPTO BISMOL) 262 MG/15ML suspension, Take 30 mLs by mouth daily as needed for indigestion., Disp: , Rfl:  .  cetirizine (ZYRTEC) 10 MG tablet, Take 5-10 mg by mouth daily as needed for allergies or rhinitis. , Disp: , Rfl:  .  cloNIDine (CATAPRES) 0.1 MG tablet, TAKE ONE TABLET BY MOUTH EVERY EVENING, Disp: 30 tablet, Rfl: 6 .  COD LIVER OIL PO, Take 15 mLs by mouth daily., Disp: , Rfl:  .  ELIQUIS 2.5 MG TABS tablet, TAKE ONE TABLET BY MOUTH TWICE DAILY, Disp: 60 tablet, Rfl: 3 .  furosemide (LASIX) 20 MG tablet, TAKE 1 TABLET BY MOUTH EVERY DAY AS NEEDED FOR FLUID, Disp: 30  tablet, Rfl: 6 .  glimepiride (AMARYL) 1 MG tablet, Take 1 mg by mouth daily before breakfast. , Disp: , Rfl:  .  guaiFENesin-dextromethorphan (ROBITUSSIN DM) 100-10 MG/5ML syrup, Take 5 mLs by mouth every 4 (four) hours as needed for cough (SUGAR FREE)., Disp: , Rfl:  .  levothyroxine (SYNTHROID, LEVOTHROID) 25 MCG tablet, Take 25 mcg by mouth daily before breakfast., Disp: , Rfl:  .  lisinopril (PRINIVIL,ZESTRIL) 20 MG tablet, TAKE ONE TABLET BY MOUTH TWICE DAILY, Disp: 180 tablet, Rfl: 3 .  metoprolol succinate (TOPROL-XL) 50 MG 24 hr tablet, TAKE ONE TABLET BY MOUTH EVERY MORNING immediately following a meal., Disp: 90 tablet, Rfl: 3 .  omeprazole (PRILOSEC) 20 MG capsule, TAKE 1 CAPSULE BY MOUTH EVERY DAY, Disp: 30 capsule, Rfl: 5 .  polyethylene glycol (MIRALAX / GLYCOLAX) packet, Take 17 g 2 (two) times a week by mouth., Disp: , Rfl:  .  potassium chloride (MICRO-K) 10 MEQ CR capsule, TAKE TWO CAPSULES  BY MOUTH DAILY FOR low potassium, Disp: , Rfl: 5 .  spironolactone (ALDACTONE) 25 MG tablet, TAKE ONE TABLET BY MOUTH EVERY MORNING, Disp: 30 tablet, Rfl: 6 .  umeclidinium bromide (INCRUSE ELLIPTA) 62.5 MCG/INH AEPB, Inhale 1 puff into the lungs daily., Disp: , Rfl:   Allergies Chocolate  Review of Systems Review of Systems - Oncology ROS as per HPI otherwise 12 point ROS is negative other than constipation.     Physical Exam  Vitals Wt Readings from Last 3 Encounters:  09/14/17 125 lb (56.7 kg)  08/18/17 127 lb (57.6 kg)  07/13/17 123 lb (55.8 kg)   Temp Readings from Last 3 Encounters:  09/14/17 97.7 F (36.5 C) (Oral)  07/13/17 97.8 F (36.6 C) (Oral)  06/03/17 97.7 F (36.5 C) (Oral)   BP Readings from Last 3 Encounters:  09/14/17 (!) 192/67  08/18/17 (!) 146/62  07/13/17 (!) 188/75   Pulse Readings from Last 3 Encounters:  09/14/17 67  08/18/17 61  07/13/17 62    Constitutional: Well-developed, well-nourished, and in no distress.   HENT: Head: Normocephalic and atraumatic.  Mouth/Throat: No oropharyngeal exudate. Mucosa moist. Eyes: Pupils are equal, round, and reactive to light. Conjunctivae are normal. No scleral icterus.  Neck: Normal range of motion. Neck supple. No JVD present.  Cardiovascular: Normal rate, regular rhythm and normal heart sounds.  Exam reveals no gallop and no friction rub.   No murmur heard. Pulmonary/Chest: Effort normal and breath sounds normal. No respiratory distress. No wheezes.No rales.  Abdominal: Soft. Bowel sounds are normal. No distension. There is no tenderness. There is no guarding.  Musculoskeletal: No edema or tenderness.  Lymphadenopathy: No cervical, axillary or supraclavicular adenopathy.  Neurological: Alert and oriented to person, place, and time. No cranial nerve deficit.  Skin: Skin is warm and dry. No rash noted. No erythema. No pallor.  Psychiatric: Affect and judgment normal.   Labs Appointment on 09/14/2017   Component Date Value Ref Range Status  . WBC 09/14/2017 3.7* 4.0 - 10.5 K/uL Final  . RBC 09/14/2017 3.44* 3.87 - 5.11 MIL/uL Final  . Hemoglobin 09/14/2017 10.6* 12.0 - 15.0 g/dL Final  . HCT 09/14/2017 33.7* 36.0 - 46.0 % Final  . MCV 09/14/2017 98.0  78.0 - 100.0 fL Final  . MCH 09/14/2017 30.8  26.0 - 34.0 pg Final  . MCHC 09/14/2017 31.5  30.0 - 36.0 g/dL Final  . RDW 09/14/2017 14.7  11.5 - 15.5 % Final  . Platelets 09/14/2017 220  150 - 400  K/uL Final  . Neutrophils Relative % 09/14/2017 53  % Final  . Neutro Abs 09/14/2017 2.0  1.7 - 7.7 K/uL Final  . Lymphocytes Relative 09/14/2017 37  % Final  . Lymphs Abs 09/14/2017 1.4  0.7 - 4.0 K/uL Final  . Monocytes Relative 09/14/2017 8  % Final  . Monocytes Absolute 09/14/2017 0.3  0.1 - 1.0 K/uL Final  . Eosinophils Relative 09/14/2017 1  % Final  . Eosinophils Absolute 09/14/2017 0.1  0.0 - 0.7 K/uL Final  . Basophils Relative 09/14/2017 1  % Final  . Basophils Absolute 09/14/2017 0.0  0.0 - 0.1 K/uL Final   Performed at [facility], [address], [place], [place] [address]  . Sodium 09/14/2017 137  135 - 145 mmol/L Final  . Potassium 09/14/2017 4.0  3.5 - 5.1 mmol/L Final  . Chloride 09/14/2017 109  101 - 111 mmol/L Final  . CO2 09/14/2017 22  22 - 32 mmol/L Final  . Glucose, Bld 09/14/2017 92  65 - 99 mg/dL Final  . BUN 09/14/2017 24* 6 - 20 mg/dL Final  . Creatinine, Ser 09/14/2017 1.22* 0.44 - 1.00 mg/dL Final  . Calcium 09/14/2017 9.0  8.9 - 10.3 mg/dL Final  . Total Protein 09/14/2017 7.2  6.5 - 8.1 g/dL Final  . Albumin 09/14/2017 3.8  3.5 - 5.0 g/dL Final  . AST 09/14/2017 28  15 - 41 U/L Final  . ALT 09/14/2017 25  14 - 54 U/L Final  . Alkaline Phosphatase 09/14/2017 72  38 - 126 U/L Final  . Total Bilirubin 09/14/2017 0.9  0.3 - 1.2 mg/dL Final  . GFR calc non Af Amer 09/14/2017 38* >60 mL/min Final  . GFR calc Af Amer 09/14/2017 44* >60 mL/min Final   Comment: (NOTE) The eGFR has been calculated using the CKD  EPI equation. This calculation has not been validated in all clinical situations. eGFR's persistently <60 mL/min signify possible Chronic Kidney Disease.   Georgiann Hahn gap 09/14/2017 6  5 - 15 Final   Performed at [facility], [address] [place], [place] [address]     Pathology Orders Placed This Encounter  Procedures  . CBC with Differential/Platelet    Standing Status:   Future    Standing Expiration Date:   09/15/2018  . Comprehensive metabolic panel    Standing Status:   Future    Standing Expiration Date:   09/15/2018  . Lactate dehydrogenase    Standing Status:   Future    Standing Expiration Date:   09/15/2018  . Ferritin    Standing Status:   Future    Standing Expiration Date:   09/15/2018       Zoila Shutter MD

## 2017-09-14 NOTE — Patient Instructions (Signed)
[facility] at [facility]  Discharge Instructions:  You were seen by Dr. Higgs today _______________________________________________________________  Thank you for choosing [facility] at [facility] to provide your oncology and hematology care.  To afford each patient quality time with our providers, please arrive at least 15 minutes before your scheduled appointment.  You need to re-schedule your appointment if you arrive 10 or more minutes late.  We strive to give you quality time with our providers, and arriving late affects you and other patients whose appointments are after yours.  Also, if you no show three or more times for appointments you may be dismissed from the clinic.  Again, thank you for choosing [facility] at [facility]. Our hope is that these requests will allow you access to exceptional care and in a timely manner. _______________________________________________________________  If you have questions after your visit, please contact our office at [phone] between the hours of 8:30 a.m. and 5:00 p.m. Voicemails left after 4:30 p.m. will not be returned until the following business day. _______________________________________________________________  For prescription refill requests, have your pharmacy contact our office. _______________________________________________________________  Recommendations made by the consultant and any test results will be sent to your referring physician. _______________________________________________________________ 

## 2017-09-17 IMAGING — RF DG ESOPHAGUS
11 of 15 series · 14 of 21 positions shown · non-contrast
Comparison: None

CLINICAL DATA: Dysphagia

EXAM:
ESOPHOGRAM / BARIUM SWALLOW / BARIUM TABLET STUDY
TECHNIQUE: Combined double contrast and single contrast examination performed
using effervescent crystals, thick barium liquid, and thin barium
liquid. The patient was observed with fluoroscopy swallowing a 13 mm
barium sulphate tablet.
FLUOROSCOPY TIME:  Fluoroscopy Time:  3 minutes 30 seconds
Radiation Exposure Index (if provided by the fluoroscopic device):
27.0 mGy
Number of Acquired Spot Images: 9 plus multiple screen captures
during fluoroscopy

[Series 1: cp_standard · 0.20mm/px · 1 of 1 slices shown (1 of 10)]
[im 1/1]
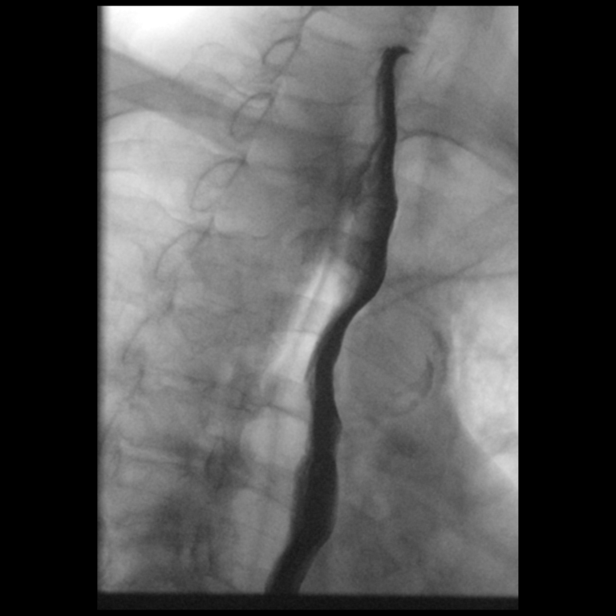

[Series 3: cp_standard · 0.20mm/px · 1 of 1 slices shown (2 of 10)]
[im 1/1]
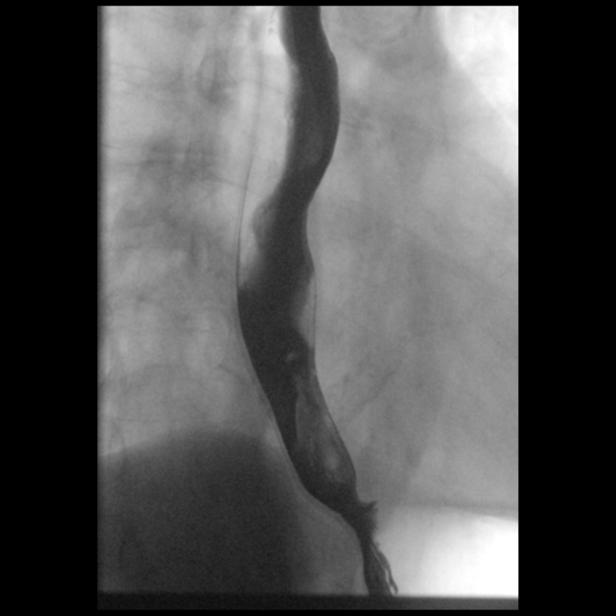

[Series 4: cp_standard · 0.20mm/px · 1 of 1 slices shown (3 of 10)]
[im 1/1]
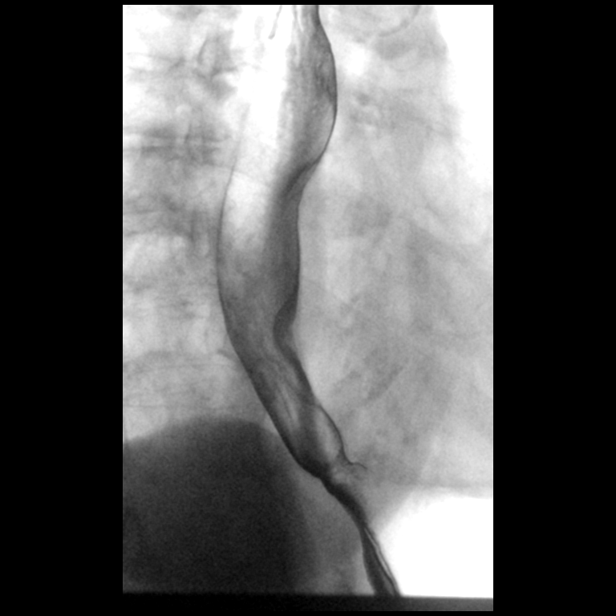

[Series 6: cp_standard · 0.20mm/px · 1 of 1 slices shown (4 of 10)]
[im 1/1]
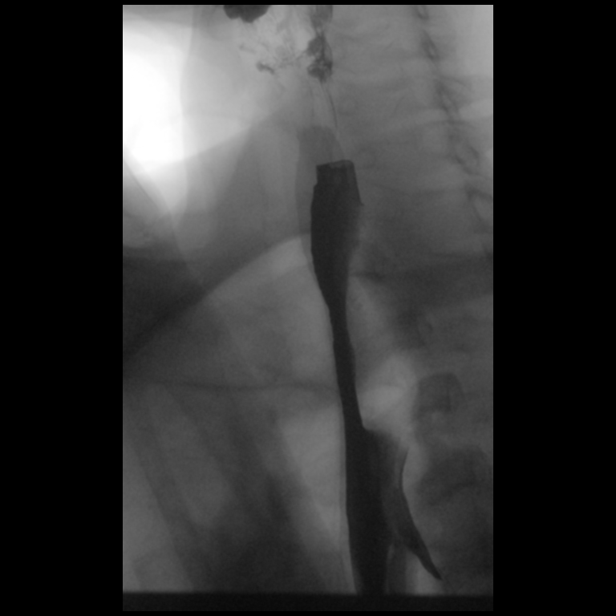

[Series 7: cp_standard · 0.20mm/px · 1 of 1 slices shown (5 of 10)]
[im 1/1]
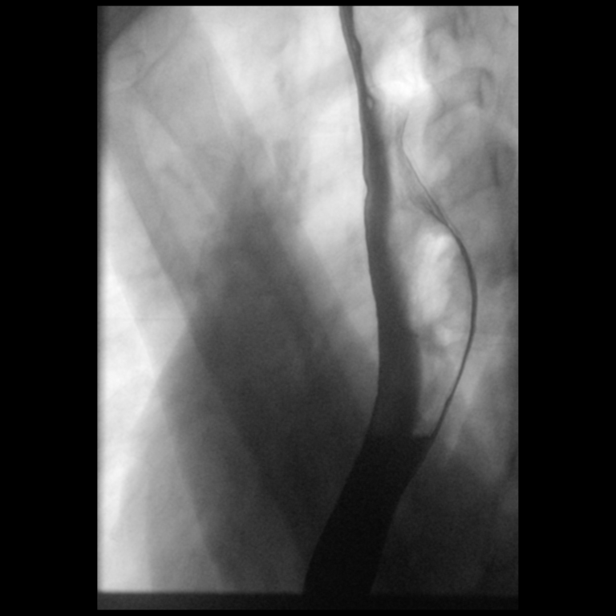

[Series 9: cp_standard · 0.20mm/px · 1 of 1 slices shown (6 of 10)]
[im 1/1]
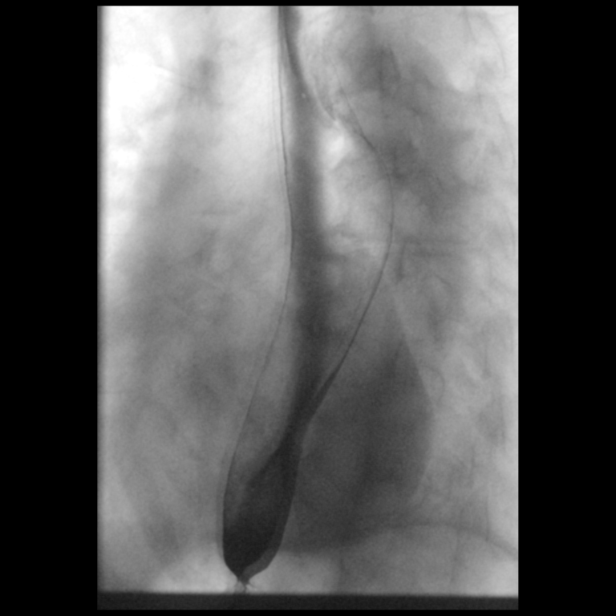

[Series 10: fluoro_barium 2fps_bw · 0.20mm/px · 3 of 9 frames shown]
[frame 2/9]
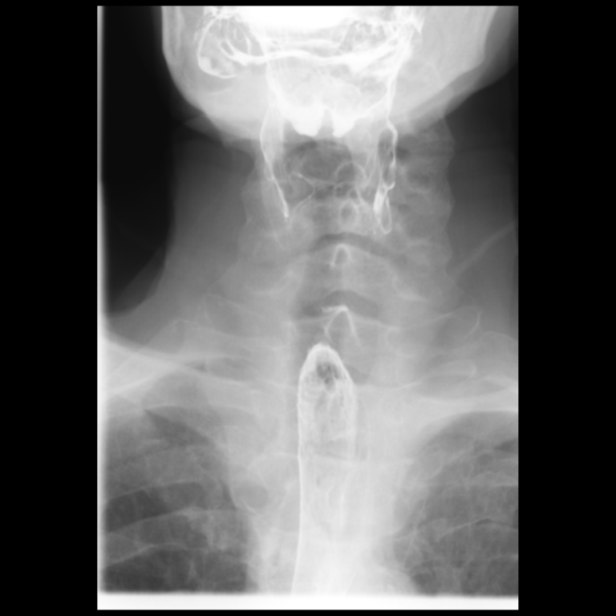
[frame 6/9]
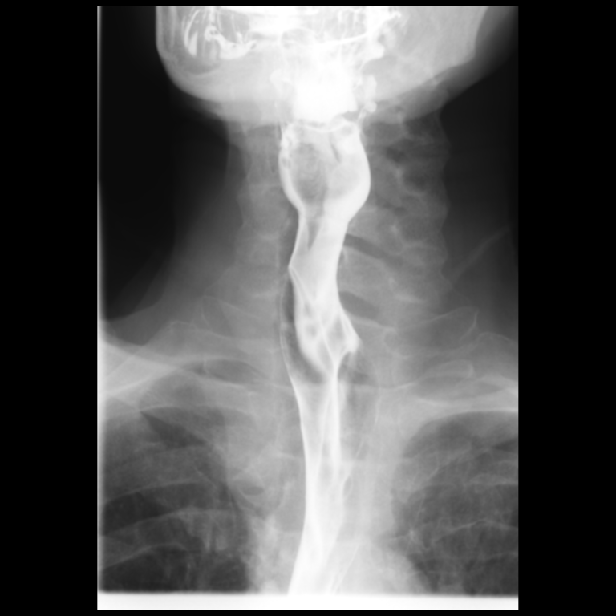
[frame 8/9]
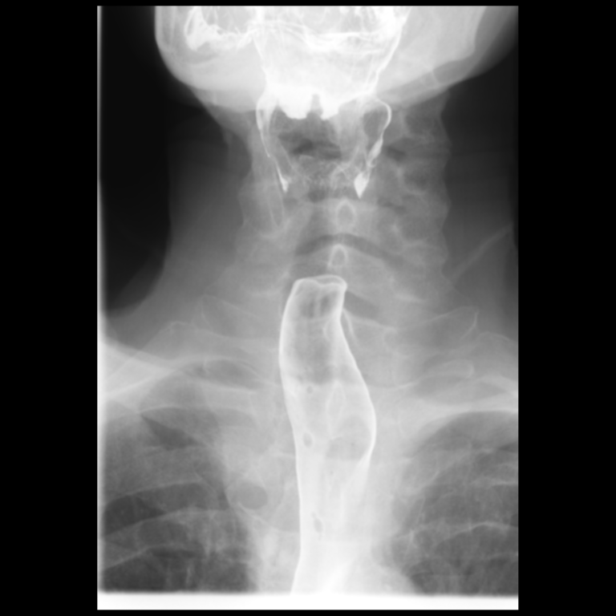

[Series 11: cp_standard · 0.19mm/px · 2 of 17 frames shown (7 of 10)]
[frame 4/17]
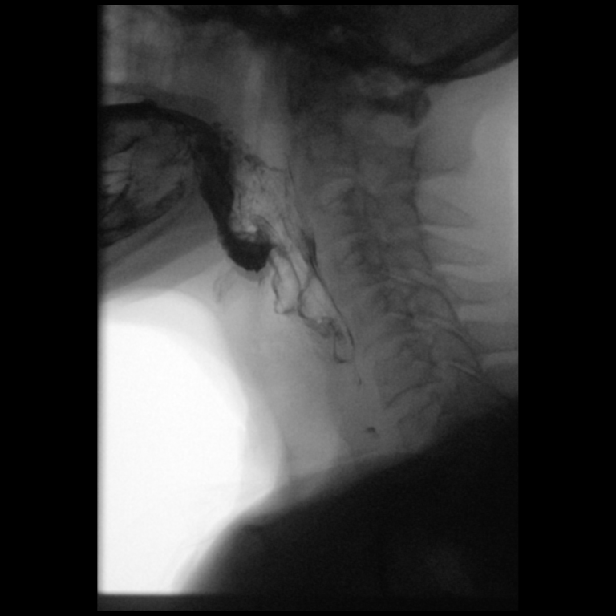
[frame 9/17]
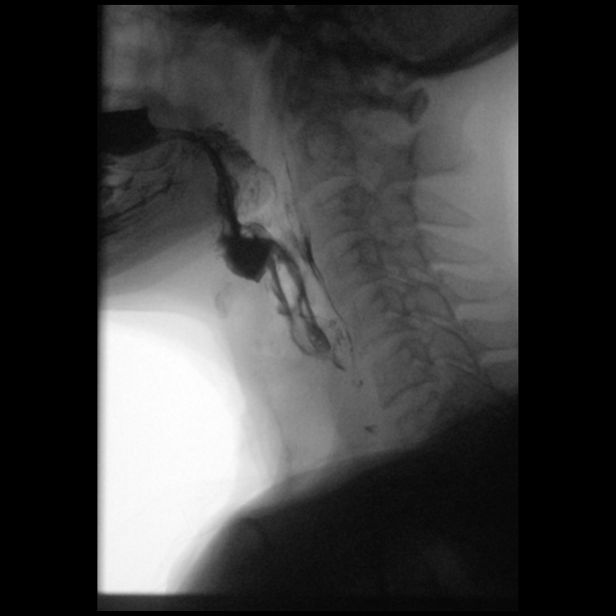

[Series 12: cp_standard · 0.17mm/px · 1 of 1 slices shown (8 of 10)]
[im 1/1]
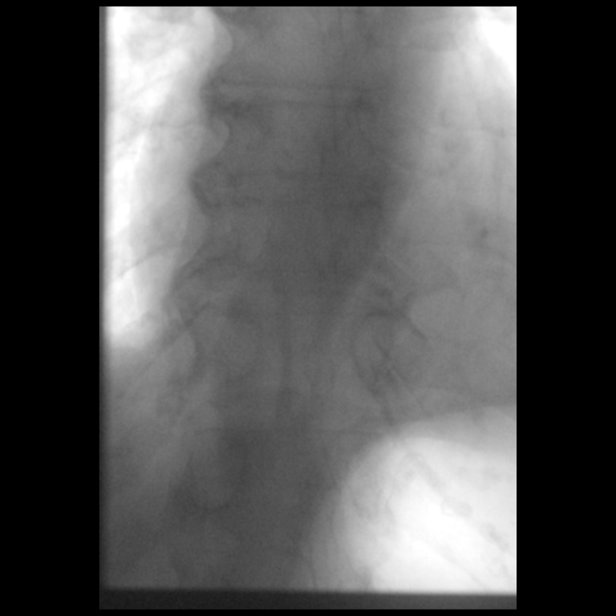

[Series 13: cp_standard · 0.20mm/px · 1 of 1 slices shown (9 of 10)]
[im 1/1]
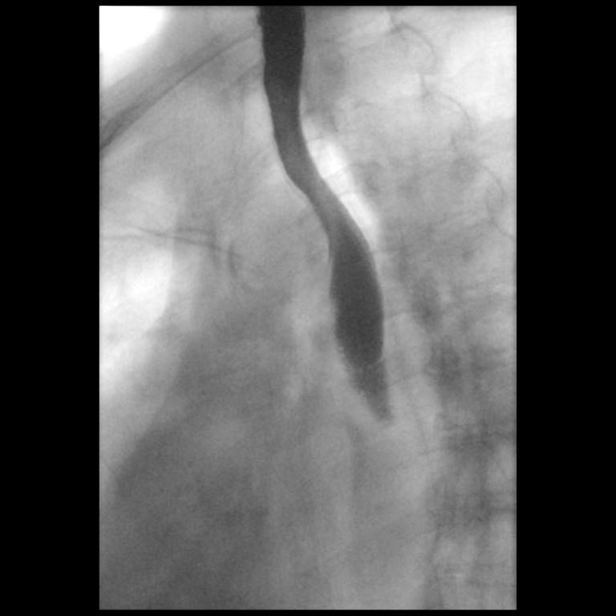

[Series 15: cp_standard · 0.20mm/px · 1 of 1 slices shown (10 of 10)]
[im 1/1]
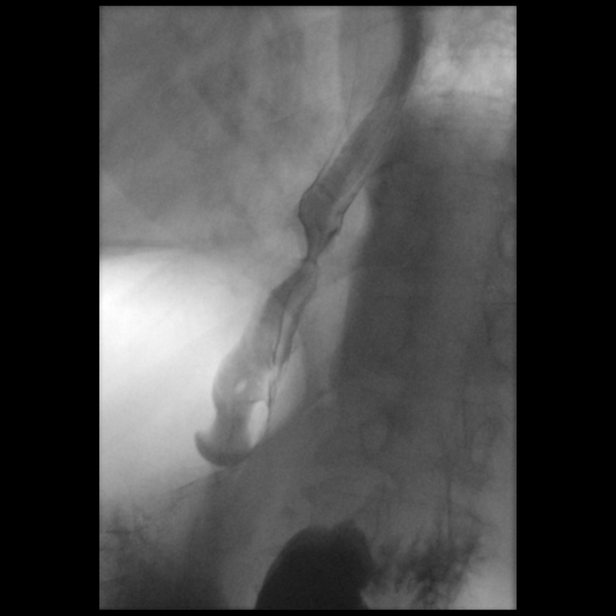

[14 of 21 positions shown; findings below may reference images not displayed]

FINDINGS: Diffuse age-related esophageal dysmotility.

Poor clearance of barium by primary peristaltic waves.

Secondary and tertiary waves noted.

Narrowing of the esophagus at the gastroesophageal junction
obstructing a 12.5 mm diameter barium tablet.

Remainder of esophagus distends normally.

No areas of wall irregularity or mucosal or ulceration no persistent
intraluminal filling defects.

Targeted rapid sequence imaging of the cervical esophagus and
hypopharynx showed mild laryngeal penetration without aspiration.

Minimal vallecular residuals.
IMPRESSION: Narrowing at the gastroesophageal junction obstructing a 12.5 mm
diameter barium tablet.

Laryngeal penetration without aspiration.

Diffuse age-related esophageal dysmotility.

## 2017-09-20 ENCOUNTER — Other Ambulatory Visit: Payer: Self-pay | Admitting: Cardiology

## 2017-10-06 ENCOUNTER — Other Ambulatory Visit: Payer: Self-pay | Admitting: Cardiology

## 2017-12-02 ENCOUNTER — Telehealth: Payer: Self-pay | Admitting: Cardiology

## 2017-12-02 NOTE — Telephone Encounter (Signed)
Patient called asking to be seen asap due to not feeling right Stated she was laying on couch head is hurting her and her heart feels like it is racing out of her chest

## 2017-12-02 NOTE — Telephone Encounter (Signed)
Patient states she feels like heart is racing and she is very short of breath. Patient also states that she is having a severe headache. Patient states her son is on the way to bring her into the office. Advised patient that we did not have any openings today and that she needs to go straight to the ER. Patient stated right now she did not feel like going to the ER but would see how she felt in a little while. Advised patient again it would be best for her to be evaluated by a physician in the ER. Patient verbalized understanding.

## 2018-01-09 ENCOUNTER — Inpatient Hospital Stay (HOSPITAL_COMMUNITY): Payer: Medicare Other | Attending: Internal Medicine

## 2018-01-09 DIAGNOSIS — N289 Disorder of kidney and ureter, unspecified: Secondary | ICD-10-CM | POA: Diagnosis not present

## 2018-01-09 DIAGNOSIS — I10 Essential (primary) hypertension: Secondary | ICD-10-CM | POA: Diagnosis not present

## 2018-01-09 DIAGNOSIS — D509 Iron deficiency anemia, unspecified: Secondary | ICD-10-CM | POA: Diagnosis not present

## 2018-01-09 DIAGNOSIS — F329 Major depressive disorder, single episode, unspecified: Secondary | ICD-10-CM | POA: Diagnosis not present

## 2018-01-09 DIAGNOSIS — E039 Hypothyroidism, unspecified: Secondary | ICD-10-CM | POA: Diagnosis not present

## 2018-01-09 DIAGNOSIS — M7989 Other specified soft tissue disorders: Secondary | ICD-10-CM | POA: Diagnosis not present

## 2018-01-09 DIAGNOSIS — D72819 Decreased white blood cell count, unspecified: Secondary | ICD-10-CM | POA: Diagnosis not present

## 2018-01-09 DIAGNOSIS — D500 Iron deficiency anemia secondary to blood loss (chronic): Secondary | ICD-10-CM

## 2018-01-09 LAB — CBC WITH DIFFERENTIAL/PLATELET
Basophils Absolute: 0.1 10*3/uL (ref 0.0–0.1)
Basophils Relative: 2 %
EOS ABS: 0.1 10*3/uL (ref 0.0–0.7)
EOS PCT: 2 %
HCT: 31.9 % — ABNORMAL LOW (ref 36.0–46.0)
Hemoglobin: 10.3 g/dL — ABNORMAL LOW (ref 12.0–15.0)
LYMPHS ABS: 1.1 10*3/uL (ref 0.7–4.0)
LYMPHS PCT: 34 %
MCH: 31.7 pg (ref 26.0–34.0)
MCHC: 32.3 g/dL (ref 30.0–36.0)
MCV: 98.2 fL (ref 78.0–100.0)
MONO ABS: 0.2 10*3/uL (ref 0.1–1.0)
MONOS PCT: 7 %
Neutro Abs: 1.8 10*3/uL (ref 1.7–7.7)
Neutrophils Relative %: 55 %
PLATELETS: 174 10*3/uL (ref 150–400)
RBC: 3.25 MIL/uL — AB (ref 3.87–5.11)
RDW: 14.3 % (ref 11.5–15.5)
WBC: 3.1 10*3/uL — ABNORMAL LOW (ref 4.0–10.5)

## 2018-01-09 LAB — COMPREHENSIVE METABOLIC PANEL
ALT: 15 U/L (ref 0–44)
ANION GAP: 8 (ref 5–15)
AST: 20 U/L (ref 15–41)
Albumin: 3.8 g/dL (ref 3.5–5.0)
Alkaline Phosphatase: 59 U/L (ref 38–126)
BUN: 17 mg/dL (ref 8–23)
CHLORIDE: 105 mmol/L (ref 98–111)
CO2: 24 mmol/L (ref 22–32)
CREATININE: 1.06 mg/dL — AB (ref 0.44–1.00)
Calcium: 9 mg/dL (ref 8.9–10.3)
GFR calc non Af Amer: 45 mL/min — ABNORMAL LOW (ref >60)
GFR, EST AFRICAN AMERICAN: 53 mL/min — AB (ref >60)
Glucose, Bld: 104 mg/dL — ABNORMAL HIGH (ref 70–99)
Potassium: 4 mmol/L (ref 3.5–5.1)
SODIUM: 137 mmol/L (ref 135–145)
Total Bilirubin: 1 mg/dL (ref 0.3–1.2)
Total Protein: 7 g/dL (ref 6.5–8.1)

## 2018-01-09 LAB — FERRITIN: FERRITIN: 107 ng/mL (ref 11–307)

## 2018-01-09 LAB — LACTATE DEHYDROGENASE: LDH: 175 U/L (ref 98–192)

## 2018-01-12 ENCOUNTER — Encounter (HOSPITAL_COMMUNITY): Payer: Self-pay | Admitting: Internal Medicine

## 2018-01-12 ENCOUNTER — Other Ambulatory Visit (HOSPITAL_COMMUNITY): Payer: Self-pay

## 2018-01-12 ENCOUNTER — Inpatient Hospital Stay (HOSPITAL_BASED_OUTPATIENT_CLINIC_OR_DEPARTMENT_OTHER): Payer: Medicare Other | Admitting: Internal Medicine

## 2018-01-12 VITALS — BP 193/77 | HR 58 | Temp 97.5°F | Resp 16 | Wt 125.6 lb

## 2018-01-12 DIAGNOSIS — N289 Disorder of kidney and ureter, unspecified: Secondary | ICD-10-CM | POA: Diagnosis not present

## 2018-01-12 DIAGNOSIS — D509 Iron deficiency anemia, unspecified: Secondary | ICD-10-CM | POA: Diagnosis not present

## 2018-01-12 DIAGNOSIS — D72819 Decreased white blood cell count, unspecified: Secondary | ICD-10-CM

## 2018-01-12 DIAGNOSIS — I10 Essential (primary) hypertension: Secondary | ICD-10-CM

## 2018-01-12 DIAGNOSIS — E039 Hypothyroidism, unspecified: Secondary | ICD-10-CM

## 2018-01-12 DIAGNOSIS — M7989 Other specified soft tissue disorders: Secondary | ICD-10-CM

## 2018-01-12 DIAGNOSIS — D500 Iron deficiency anemia secondary to blood loss (chronic): Secondary | ICD-10-CM

## 2018-01-12 NOTE — Progress Notes (Signed)
Diagnosis Iron deficiency anemia due to chronic blood loss - Plan: CBC with Differential/Platelet, Comprehensive metabolic panel, Lactate dehydrogenase, Ferritin  Staging Cancer Staging No matching staging information was found for the patient.  Assessment and Plan:   1.  Iron deficiency anemia.  Patient was last treated with IV iron in January 2019.    Labs done 01/09/2018 reviewed and showed WBC 3.1 HB 10.3 plts 174,000, Chemistries WNL with K+ 4 Cr 1.06 and normal LFTs.  Ferritin is 107.  She will RTC in 06/2018 for follow-up and repeat labs.  She should notify the office if she has anyy problems prior to that time.  SPEP was negative.    2.  Leukopenia.  WBC 3.1.  Likely a normal varient.  Labs done 09/14/2017 show a white count 3.7.    3.  HTN.  BP is 193/77.  Family member reports pt has White coat hypertension.  Follow-up with PCP.    4.  Hypothyroidism.  Pt on synthroid.  Follow-up with PCP.    5.  RI.  Cr 1.  Repeat chemistries in 06/2018.    6.  Leg swelling.  Family member reports this is chronic.  Follow-up with PCP as directed.    Interval History:  Historical data obtained from the note of 09/14/2017.  84 y.o. female previously followed by Dr. Talbert Cage for abnormal weight loss.  Patient states she is lost about 30 pounds unintentionally since June 2017.  She states her appetite is very good and she cooks her own meals and they are very nutritious.  She lives alone and performs most of her own ADLs.  Her daughter does have a housekeeper that comes out to help with major cleaning for the patient.  Patient had a CT abdomen and pelvis without contrast performed on 02/02/2017 for evaluation for weight loss and was noted to have increased attenuation in the liver of unknown certain significance, chronic calcification of the left adrenal gland, minimal right lower lobe infiltrate, otherwise no acute abnormalities.  Patient had a chest x-ray performed on 04/19/2017 which showed no acute  cardiopulmonary disease.  Patient also had a complete abdominal ultrasound performed on 04/19/2017 which was negative for gallstones, no ductal dilatation, normal echogenicity in the liver parenchyma and no focal abnormalities were seen in the liver.  Her most recent blood work from 04/13/2017 demonstrated CMP with a creatinine of 1.58, BUN 34, and GFR 34, AST 38, ALT 39, hemoglobin A1c 5.3, sed rate 34.  CBC on 04/13/2017 demonstrated WBC 3.9K, hemoglobin 8.8 g/dL, hematocrit 28.2%, MCV 88.4, platelet count 214K, differential was normal.Pt had CT abdomen and pelvis done 02/02/2017 that showed  IMPRESSION: Increased attenuation in the liver is noted of uncertain significance. The possibility of an underlying deposition disorder could not be totally excluded.  Minimal right lower lobe infiltrate.  Chronic calcification of the left adrenal gland.  No acute abnormality is noted.  She was treated with IV iron on 05/27/2017 and 06/03/2017.    Current Status: Patient is seen today for follow-up.  She is here to go over labs.   Problem List Patient Active Problem List   Diagnosis Date Noted  . Iron deficiency anemia [D50.9] 05/12/2017  . GERD (gastroesophageal reflux disease) [K21.9] 03/17/2016  . Constipation [K59.00] 10/29/2015  . Gastric erosions [K25.9] 02/03/2015  . Sinus bradycardia [R00.1] 09/26/2014  . Dyspnea on exertion [R06.09] 09/18/2014  . Chronic diastolic heart failure ([place]) [I50.32] 09/18/2014  . Dizziness [R42] 09/18/2014  . Atrial flutter with rapid ventricular  response ([place]) [I48.92]   . Atrial fibrillation with RVR ([place]) [I48.91] 09/09/2014  . Atrial flutter ([place]) [I48.92] 07/13/2014  . Hyponatremia [E87.1] 07/13/2014  . Diabetes ([place]) [E11.9] 07/13/2014  . Hypothyroidism [E03.9] 07/13/2014  . Tachycardia [R00.0] 07/13/2014  . Dysphagia, pharyngoesophageal phase [R13.14] 04/16/2014  . Neurogenic pain of lower extremity [M79.2] 03/15/2012  . Left bundle branch block  [I44.7] 03/02/2011  . RENAL INSUFFICIENCY [N25.9] 05/22/2010  . Essential hypertension, benign [I10] 10/09/2009  . Mitral regurgitation [I34.0] 10/09/2009  . HYPERLIPIDEMIA-MIXED [E78.5] 02/12/2009  . Palpitations [R00.2] 02/12/2009    Past Medical History Past Medical History:  Diagnosis Date  . Anxiety   . Atrial fibrillation ([place])   . COPD (chronic obstructive pulmonary disease) ([place])   . Depression   . Hyperlipidemia   . Hypertension   . Hypothyroidism   . Mitral regurgitation    Mild  . Palpitations    PAT/EAT/NSVT, normal LVEF  . Type 2 diabetes mellitus [facility])     Past Surgical History Past Surgical History:  Procedure Laterality Date  . 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 hiatal hernia. Antral erosions status post gastric biopsy.  . ESOPHAGOGASTRODUODENOSCOPY N/A 05/12/2016   Procedure: ESOPHAGOGASTRODUODENOSCOPY (EGD);  Surgeon: Daneil Dolin, MD;  [facility];  Service: Endoscopy;  Laterality: N/A;  730   . MALONEY DILATION N/A 04/17/2014   Procedure: Venia Minks DILATION;  Surgeon: Daneil Dolin, MD;  [facility];  Service: Endoscopy;  Laterality: N/A;  Venia Minks DILATION N/A 05/12/2016   Procedure: Venia Minks DILATION;  Surgeon: Daneil Dolin, MD;  [facility];  Service: Endoscopy;  Laterality: N/A;  . none      Family History Family History  Problem Relation Age of Onset  . Heart failure Mother        Died in her 51s  . Tuberculosis Father        Died at young age  . Cancer Brother   . Arthritis Unknown   . Colon cancer Unknown      Social History  reports that she has never smoked. She has never used smokeless tobacco. She reports that she does not drink alcohol or use drugs.  Medications  Current Outpatient Medications:  .  acetaminophen (TYLENOL) 650 MG CR tablet, Take 650 mg by mouth as needed for  pain., Disp: , Rfl:  .  ALPRAZolam (XANAX) 0.25 MG tablet, Take 0.25 mg by mouth daily as needed for anxiety or sleep. , Disp: , Rfl:  .  amiodarone (PACERONE) 100 MG tablet, Take 1 tablet (100 mg total) by mouth daily., Disp: 30 tablet, Rfl: 6 .  atorvastatin (LIPITOR) 40 MG tablet, Take 40 mg by mouth daily at 6 PM., Disp: , Rfl:  .  bismuth subsalicylate (PEPTO BISMOL) 262 MG/15ML suspension, Take 30 mLs by mouth daily as needed for indigestion., Disp: , Rfl:  .  cetirizine (ZYRTEC) 10 MG tablet, Take 5-10 mg by mouth daily as needed for allergies or rhinitis. , Disp: , Rfl:  .  cloNIDine (CATAPRES) 0.1 MG tablet, TAKE ONE TABLET BY MOUTH EVERY EVENING, Disp: 30 tablet, Rfl: 6 .  COD LIVER OIL PO, Take 15 mLs by mouth daily., Disp: , Rfl:  .  ELIQUIS 2.5 MG TABS tablet, TAKE ONE TABLET BY MOUTH TWICE DAILY, Disp: 180 tablet, Rfl: 1 .  furosemide (LASIX) 20 MG tablet, TAKE 1 TABLET BY MOUTH EVERY DAY  AS NEEDED FOR FLUID, Disp: 30 tablet, Rfl: 6 .  glimepiride (AMARYL) 1 MG tablet, Take 1 mg by mouth daily before breakfast. , Disp: , Rfl:  .  guaiFENesin-dextromethorphan (ROBITUSSIN DM) 100-10 MG/5ML syrup, Take 5 mLs by mouth every 4 (four) hours as needed for cough (SUGAR FREE)., Disp: , Rfl:  .  levothyroxine (SYNTHROID, LEVOTHROID) 25 MCG tablet, Take 25 mcg by mouth daily before breakfast., Disp: , Rfl:  .  lisinopril (PRINIVIL,ZESTRIL) 20 MG tablet, TAKE ONE TABLET BY MOUTH TWICE DAILY, Disp: 180 tablet, Rfl: 3 .  metoprolol succinate (TOPROL-XL) 50 MG 24 hr tablet, TAKE ONE TABLET BY MOUTH EVERY MORNING immediately following a meal., Disp: 90 tablet, Rfl: 3 .  omeprazole (PRILOSEC) 20 MG capsule, TAKE 1 CAPSULE BY MOUTH EVERY DAY, Disp: 30 capsule, Rfl: 5 .  polyethylene glycol (MIRALAX / GLYCOLAX) packet, Take 17 g 2 (two) times a week by mouth., Disp: , Rfl:  .  potassium chloride (MICRO-K) 10 MEQ CR capsule, TAKE TWO CAPSULES BY MOUTH DAILY FOR low potassium, Disp: , Rfl: 5 .   spironolactone (ALDACTONE) 25 MG tablet, TAKE ONE TABLET BY MOUTH EVERY MORNING, Disp: 90 tablet, Rfl: 1 .  umeclidinium bromide (INCRUSE ELLIPTA) 62.5 MCG/INH AEPB, Inhale 1 puff into the lungs daily., Disp: , Rfl:   Allergies Chocolate  Review of Systems Review of Systems - Oncology ROS leg swelling   Physical Exam  Vitals Wt Readings from Last 3 Encounters:  01/12/18 125 lb 9.6 oz (57 kg)  09/14/17 125 lb (56.7 kg)  08/18/17 127 lb (57.6 kg)   Temp Readings from Last 3 Encounters:  01/12/18 (!) 97.5 F (36.4 C) (Oral)  09/14/17 97.7 F (36.5 C) (Oral)  07/13/17 97.8 F (36.6 C) (Oral)   BP Readings from Last 3 Encounters:  01/12/18 (!) 193/77  09/14/17 (!) 192/67  08/18/17 (!) 146/62   Pulse Readings from Last 3 Encounters:  01/12/18 (!) 58  09/14/17 67  08/18/17 61   Constitutional: Well-developed, well-nourished, and in no distress.   HENT: Head: Normocephalic and atraumatic.  Mouth/Throat: No oropharyngeal exudate. Mucosa moist. Eyes: Pupils are equal, round, and reactive to light. Conjunctivae are normal. No scleral icterus.  Neck: Normal range of motion. Neck supple. No JVD present.  Cardiovascular: Normal rate, regular rhythm and normal heart sounds.  Exam reveals no gallop and no friction rub.   No murmur heard. Pulmonary/Chest: Effort normal and breath sounds normal. No respiratory distress. No wheezes.No rales.  Abdominal: Soft. Bowel sounds are normal. No distension. There is no tenderness. There is no guarding.  Musculoskeletal: Bilateral LE edema.   Lymphadenopathy: No cervical, axillary or supraclavicular adenopathy.  Neurological: Alert and oriented to person, place, and time. No cranial nerve deficit.  Skin: Skin is warm and dry. No rash noted. No erythema. No pallor.  Psychiatric: Affect and judgment normal.   Labs No visits with results within 3 Day(s) from this visit.  Latest known visit with results is:  Appointment on 01/09/2018   Component Date Value Ref Range Status  . WBC 01/09/2018 3.1* 4.0 - 10.5 K/uL Final  . RBC 01/09/2018 3.25* 3.87 - 5.11 MIL/uL Final  . Hemoglobin 01/09/2018 10.3* 12.0 - 15.0 g/dL Final  . HCT 01/09/2018 31.9* 36.0 - 46.0 % Final  . MCV 01/09/2018 98.2  78.0 - 100.0 fL Final  . MCH 01/09/2018 31.7  26.0 - 34.0 pg Final  . MCHC 01/09/2018 32.3  30.0 - 36.0 g/dL Final  . RDW 01/09/2018 14.3  11.5 - 15.5 % Final  . Platelets 01/09/2018 174  150 - 400 K/uL Final  . Neutrophils Relative % 01/09/2018 55  % Final  . Neutro Abs 01/09/2018 1.8  1.7 - 7.7 K/uL Final  . Lymphocytes Relative 01/09/2018 34  % Final  . Lymphs Abs 01/09/2018 1.1  0.7 - 4.0 K/uL Final  . Monocytes Relative 01/09/2018 7  % Final  . Monocytes Absolute 01/09/2018 0.2  0.1 - 1.0 K/uL Final  . Eosinophils Relative 01/09/2018 2  % Final  . Eosinophils Absolute 01/09/2018 0.1  0.0 - 0.7 K/uL Final  . Basophils Relative 01/09/2018 2  % Final  . Basophils Absolute 01/09/2018 0.1  0.0 - 0.1 K/uL Final   Performed at [facility], [address] [place], [place] [address]  . Sodium 01/09/2018 137  135 - 145 mmol/L Final  . Potassium 01/09/2018 4.0  3.5 - 5.1 mmol/L Final  . Chloride 01/09/2018 105  98 - 111 mmol/L Final  . CO2 01/09/2018 24  22 - 32 mmol/L Final  . Glucose, Bld 01/09/2018 104* 70 - 99 mg/dL Final  . BUN 01/09/2018 17  8 - 23 mg/dL Final  . Creatinine, Ser 01/09/2018 1.06* 0.44 - 1.00 mg/dL Final  . Calcium 01/09/2018 9.0  8.9 - 10.3 mg/dL Final  . Total Protein 01/09/2018 7.0  6.5 - 8.1 g/dL Final  . Albumin 01/09/2018 3.8  3.5 - 5.0 g/dL Final  . AST 01/09/2018 20  15 - 41 U/L Final  . ALT 01/09/2018 15  0 - 44 U/L Final  . Alkaline Phosphatase 01/09/2018 59  38 - 126 U/L Final  . Total Bilirubin 01/09/2018 1.0  0.3 - 1.2 mg/dL Final  . GFR calc non Af Amer 01/09/2018 45* >60 mL/min Final  . GFR calc Af Amer 01/09/2018 53* >60 mL/min Final   Comment: (NOTE) The eGFR has been calculated using the CKD  EPI equation. This calculation has not been validated in all clinical situations. eGFR's persistently <60 mL/min signify possible Chronic Kidney Disease.   Georgiann Hahn gap 01/09/2018 8  5 - 15 Final   Performed at [facility], [address] [place], [place] [address]  . LDH 01/09/2018 175  98 - 192 U/L Final   Performed at [facility], [address] [place], [place] [address]  . Ferritin 01/09/2018 107  11 - 307 ng/mL Final   Performed at [facility], [place] [address], [place], [place] [address]     Pathology Orders Placed This Encounter  Procedures  . CBC with Differential/Platelet    Standing Status:   Future    Standing Expiration Date:   01/13/2020  . Comprehensive metabolic panel    Standing Status:   Future    Standing Expiration Date:   01/13/2020  . Lactate dehydrogenase    Standing Status:   Future    Standing Expiration Date:   01/13/2020  . Ferritin    Standing Status:   Future    Standing Expiration Date:   01/13/2020       Zoila Shutter MD

## 2018-02-14 ENCOUNTER — Other Ambulatory Visit: Payer: Self-pay | Admitting: Cardiology

## 2018-02-15 ENCOUNTER — Encounter: Payer: Self-pay | Admitting: *Deleted

## 2018-02-15 NOTE — Progress Notes (Signed)
Cardiology Office Note  Date: 02/16/2018   ID: [name], DOB 04-25-1927, MRN [id number]  PCP: Gareth Morgan, MD  Primary Cardiologist: Nona Dell, MD   Chief Complaint  Patient presents with  . PAF    History of Present Illness: [name] is an 84 y.o. female last seen in March.  She is here today for a routine follow-up visit.  She seems less anxious today, indicated that she was doing reasonably well.  Still lives in her own home, uses a cane or walker, has had no recent falls.  She does not report any recent chest pain or palpitations, no syncope.  Interval lab work is outlined below.  She continues to follow with Dr. Sudie Bailey every 3 months.  Present cardiac regimen includes amiodarone, Eliquis, lisinopril, Aldactone and Toprol-XL.  She uses Lasix only as needed.  Her blood pressure is much better controlled today compared to the last few visits.  Past Medical History:  Diagnosis Date  . Anxiety   . Atrial fibrillation ([facility])   . COPD (chronic obstructive pulmonary disease) ([facility])   . Depression   . Hyperlipidemia   . Hypertension   . Hypothyroidism   . Mitral regurgitation    Mild  . Palpitations    PAT/EAT/NSVT, normal LVEF  . Type 2 diabetes mellitus ([facility])     Past Surgical History:  Procedure Laterality Date  . 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 hiatal hernia. Antral erosions status post gastric biopsy.  . ESOPHAGOGASTRODUODENOSCOPY N/A 05/12/2016   Procedure: ESOPHAGOGASTRODUODENOSCOPY (EGD);  Surgeon: Corbin Ade, MD;  [facility];  Service: Endoscopy;  Laterality: N/A;  730   . MALONEY DILATION N/A 04/17/2014   Procedure: [name] DILATION;  Surgeon: Corbin Ade, MD;  [facility];  Service: Endoscopy;  Laterality: N/A;  [name] DILATION N/A 05/12/2016   Procedure: [name] DILATION;  Surgeon:  Corbin Ade, MD;  [facility];  Service: Endoscopy;  Laterality: N/A;  . none      Current Outpatient Medications  Medication Sig Dispense Refill  . acetaminophen (TYLENOL) 650 MG CR tablet Take 650 mg by mouth as needed for pain.    Marland Kitchen ALPRAZolam (XANAX) 0.25 MG tablet Take 0.25 mg by mouth daily as needed for anxiety or sleep.     Marland Kitchen amiodarone (PACERONE) 100 MG tablet Take 1 tablet (100 mg total) by mouth daily. 30 tablet 6  . atorvastatin (LIPITOR) 40 MG tablet Take 40 mg by mouth daily at 6 PM.    . bismuth subsalicylate (PEPTO BISMOL) 262 MG/15ML suspension Take 30 mLs by mouth daily as needed for indigestion.    . cetirizine (ZYRTEC) 10 MG tablet Take 5-10 mg by mouth daily as needed for allergies or rhinitis.     . COD LIVER OIL PO Take 15 mLs by mouth daily.    Marland Kitchen ELIQUIS 2.5 MG TABS tablet TAKE ONE TABLET BY MOUTH TWICE DAILY 180 tablet 1  . furosemide (LASIX) 20 MG tablet TAKE 1 TABLET BY MOUTH EVERY DAY AS NEEDED FOR FLUID 30 tablet 6  . guaiFENesin-dextromethorphan (ROBITUSSIN DM) 100-10 MG/5ML syrup Take 5 mLs by mouth every 4 (four) hours as needed for cough (SUGAR FREE).    Marland Kitchen levothyroxine (SYNTHROID, LEVOTHROID) 25 MCG tablet Take 25 mcg by mouth daily before breakfast.    . lisinopril (PRINIVIL,ZESTRIL) 20 MG tablet TAKE ONE TABLET BY  MOUTH TWICE DAILY 180 tablet 3  . metoprolol succinate (TOPROL-XL) 50 MG 24 hr tablet TAKE ONE TABLET BY MOUTH EVERY MORNING immediately following a meal. 90 tablet 3  . omeprazole (PRILOSEC) 20 MG capsule TAKE 1 CAPSULE BY MOUTH EVERY DAY 30 capsule 5  . potassium chloride (MICRO-K) 10 MEQ CR capsule TAKE TWO CAPSULES BY MOUTH DAILY FOR low potassium  5  . spironolactone (ALDACTONE) 25 MG tablet TAKE ONE TABLET BY MOUTH EVERY MORNING 90 tablet 1  . polyethylene glycol (MIRALAX / GLYCOLAX) packet Take 17 g 2 (two) times a week by mouth.    . umeclidinium bromide (INCRUSE ELLIPTA) 62.5 MCG/INH AEPB Inhale 1 puff into the lungs daily.      No current facility-administered medications for this visit.    Allergies:  Chocolate   Social History: The patient  reports that she has never smoked. She has never used smokeless tobacco. She reports that she does not drink alcohol or use drugs.   ROS:  Please see the history of present illness. Otherwise, complete review of systems is positive for hearing loss, arthritic stiffness.  All other systems are reviewed and negative.   Physical Exam: VS:  BP 110/62   Pulse (!) 55   Ht 5\' 6"  (1.676 m)   Wt 125 lb (56.7 kg)   SpO2 99%   BMI 20.18 kg/m , BMI Body mass index is 20.18 kg/m.  Wt Readings from Last 3 Encounters:  02/16/18 125 lb (56.7 kg)  01/12/18 125 lb 9.6 oz (57 kg)  09/14/17 125 lb (56.7 kg)    General: Elderly woman, using a cane.  Appears comfortable at rest. HEENT: Conjunctiva and lids normal, oropharynx clear. Neck: Supple, no elevated JVP or carotid bruits, no thyromegaly. Lungs: Clear to auscultation, nonlabored breathing at rest. Cardiac: Regular rate and rhythm, no S3, soft systolic murmur. Abdomen: Soft, nontender, bowel sounds present. Extremities: Stable lower leg edema, distal pulses 2+. Skin: Warm and dry. Musculoskeletal: No kyphosis. Neuropsychiatric: Alert and oriented x3, affect grossly appropriate.  ECG: I personally reviewed the tracing from 08/18/2017 which showed probable sinus bradycardia with lead artifact and left bundle branch block which is old.  Recent Labwork: 01/09/2018: ALT 15; AST 20; BUN 17; Creatinine, Ser 1.06; Hemoglobin 10.3; Platelets 174; Potassium 4.0; Sodium 137     Component Value Date/Time   CHOL 142 07/14/2014 0456   TRIG 32 07/14/2014 0456   HDL 58 07/14/2014 0456   CHOLHDL 2.4 07/14/2014 0456   VLDL 6 07/14/2014 0456   LDLCALC 78 07/14/2014 0456    Other Studies Reviewed Today:  Echocardiogram2/21/2016: Study Conclusions  - Left ventricle: The cavity size was normal. Systolic function was normal. The  estimated ejection fraction was in the range of 50% to 55%. Wall motion was normal; there were no regional wall motion abnormalities. There was a reduced contribution of atrial contraction to ventricular filling, due to increased ventricular diastolic pressure or atrial contractile dysfunction. Doppler parameters are consistent with a reversible restrictive pattern, indicative of decreased left ventricular diastolic compliance and/or increased left atrial pressure (grade 3 diastolic dysfunction). - Ventricular septum: Septal motion showed paradox. These changes are consistent with intraventricular conduction delay. - Aortic valve: Trileaflet; normal thickness, mildly calcified leaflets. - Mitral valve: There was moderate regurgitation. - Left atrium: The atrium was mildly to moderately dilated. - Tricuspid valve: There was moderate regurgitation. - Pulmonary arteries: PA peak pressure: 34 mm Hg (S).  Assessment and Plan:  1.  Paroxysmal atrial fibrillation.  Heart rate is regular today and she reports no interval progression and palpitations.  I reviewed her prior ECG from March.  Plan to continue low-dose amiodarone at 100 mg daily, also low-dose Eliquis for stroke prophylaxis and Toprol-XL.  I reviewed her interval lab work.  2.  Essential hypertension, blood pressure is much better controlled today on current regimen.  Keep follow-up with Dr. Sudie Bailey.  3.  Moderate mitral regurgitation by evaluation in 2016.  No significant change in cardiac murmur.  We are following this conservatively at this point.  Current medicines were reviewed with the patient today.  Disposition: Follow-up in 6 months.  Signed, Jonelle Sidle, MD, [facility] 02/16/2018 10:06 AM    [facility] at [facility] [address] [place], [place], [place] [address] Phone: [phone]; Fax: [phone]

## 2018-02-16 ENCOUNTER — Ambulatory Visit: Payer: Medicare Other | Admitting: Cardiology

## 2018-02-16 ENCOUNTER — Encounter: Payer: Self-pay | Admitting: Cardiology

## 2018-02-16 VITALS — BP 110/62 | HR 55 | Ht 66.0 in | Wt 125.0 lb

## 2018-02-16 DIAGNOSIS — I340 Nonrheumatic mitral (valve) insufficiency: Secondary | ICD-10-CM

## 2018-02-16 DIAGNOSIS — I480 Paroxysmal atrial fibrillation: Secondary | ICD-10-CM

## 2018-02-16 DIAGNOSIS — I10 Essential (primary) hypertension: Secondary | ICD-10-CM | POA: Diagnosis not present

## 2018-02-16 NOTE — Patient Instructions (Signed)

## 2018-02-24 ENCOUNTER — Telehealth: Payer: Self-pay | Admitting: Cardiology

## 2018-02-24 NOTE — Telephone Encounter (Signed)
Spoke with patient and she says she feels better and think she was nervous earlier when she called. Patient advised to take her medications at the same time each day and continue to monitor her BP and HR. Patient advised if her symptoms return or she develops chest pain, dizziness or sob, that she needed to go to the ED for an evaluation. Verbalized understanding of plan.

## 2018-02-24 NOTE — Telephone Encounter (Signed)
Patient called stating that she is having  shortness of breath since she woke up.

## 2018-02-24 NOTE — Telephone Encounter (Signed)
Pt c/o some SOB/dizziness when she woke up this morning - BP 107/69 HR 118 by BP machine at home - has not taken morning medications - pt is eating breakfast and starting to feel better, will take morning medications and continue to monitor HR since this just started this morning - pt will call back this afternoon with an update

## 2018-02-24 NOTE — Telephone Encounter (Signed)
BP Reading  Feeling a little better  Right arm 140/54  Pulse 53  Left arm  131/58  Pulse 48

## 2018-03-05 ENCOUNTER — Other Ambulatory Visit: Payer: Self-pay

## 2018-03-05 ENCOUNTER — Encounter (HOSPITAL_COMMUNITY): Payer: Self-pay | Admitting: Emergency Medicine

## 2018-03-05 ENCOUNTER — Emergency Department (HOSPITAL_COMMUNITY): Payer: Medicare Other

## 2018-03-05 ENCOUNTER — Emergency Department (HOSPITAL_COMMUNITY)
Admission: EM | Admit: 2018-03-05 | Discharge: 2018-03-05 | Disposition: A | Discharge status: Home / Self Care | Payer: Medicare Other | Attending: Emergency Medicine | Admitting: Emergency Medicine

## 2018-03-05 DIAGNOSIS — R0602 Shortness of breath: Secondary | ICD-10-CM | POA: Diagnosis present

## 2018-03-05 DIAGNOSIS — Z7901 Long term (current) use of anticoagulants: Secondary | ICD-10-CM | POA: Insufficient documentation

## 2018-03-05 DIAGNOSIS — R0600 Dyspnea, unspecified: Secondary | ICD-10-CM | POA: Insufficient documentation

## 2018-03-05 DIAGNOSIS — R000 Tachycardia, unspecified: Secondary | ICD-10-CM | POA: Diagnosis not present

## 2018-03-05 DIAGNOSIS — I5032 Chronic diastolic (congestive) heart failure: Secondary | ICD-10-CM | POA: Insufficient documentation

## 2018-03-05 DIAGNOSIS — I110 Hypertensive heart disease with heart failure: Secondary | ICD-10-CM | POA: Insufficient documentation

## 2018-03-05 DIAGNOSIS — E039 Hypothyroidism, unspecified: Secondary | ICD-10-CM | POA: Diagnosis not present

## 2018-03-05 DIAGNOSIS — Z79899 Other long term (current) drug therapy: Secondary | ICD-10-CM | POA: Insufficient documentation

## 2018-03-05 LAB — CBC
HEMATOCRIT: 37.2 % (ref 36.0–46.0)
Hemoglobin: 11.5 g/dL — ABNORMAL LOW (ref 12.0–15.0)
MCH: 29.6 pg (ref 26.0–34.0)
MCHC: 30.9 g/dL (ref 30.0–36.0)
MCV: 95.9 fL (ref 80.0–100.0)
PLATELETS: 207 10*3/uL (ref 150–400)
RBC: 3.88 MIL/uL (ref 3.87–5.11)
RDW: 13.7 % (ref 11.5–15.5)
WBC: 3.5 10*3/uL — AB (ref 4.0–10.5)
nRBC: 0 % (ref 0.0–0.2)

## 2018-03-05 LAB — I-STAT TROPONIN, ED: TROPONIN I, POC: 0.01 ng/mL (ref 0.00–0.08)

## 2018-03-05 LAB — BASIC METABOLIC PANEL
Anion gap: 8 (ref 5–15)
BUN: 20 mg/dL (ref 8–23)
CO2: 24 mmol/L (ref 22–32)
Calcium: 9.3 mg/dL (ref 8.9–10.3)
Chloride: 106 mmol/L (ref 98–111)
Creatinine, Ser: 1.26 mg/dL — ABNORMAL HIGH (ref 0.44–1.00)
GFR calc Af Amer: 42 mL/min — ABNORMAL LOW (ref >60)
GFR calc non Af Amer: 37 mL/min — ABNORMAL LOW (ref >60)
Glucose, Bld: 138 mg/dL — ABNORMAL HIGH (ref 70–99)
POTASSIUM: 4.1 mmol/L (ref 3.5–5.1)
SODIUM: 138 mmol/L (ref 135–145)

## 2018-03-05 LAB — PROTIME-INR
INR: 1.18
Prothrombin Time: 14.9 seconds (ref 11.4–15.2)

## 2018-03-05 MED ORDER — SODIUM CHLORIDE 0.9 % IV BOLUS
500.0000 mL | Freq: Once | INTRAVENOUS | Status: AC
  Administered 2018-03-05: 500 mL via INTRAVENOUS

## 2018-03-05 MED ORDER — ALBUTEROL SULFATE (2.5 MG/3ML) 0.083% IN NEBU
2.5000 mg | INHALATION_SOLUTION | Freq: Once | RESPIRATORY_TRACT | Status: AC
  Administered 2018-03-05: 2.5 mg via RESPIRATORY_TRACT
  Filled 2018-03-05: qty 3

## 2018-03-05 NOTE — ED Notes (Signed)
Awaiting re-eval and dispo 

## 2018-03-05 NOTE — ED Triage Notes (Signed)
Pt reports shortness of breath and feeling of heart racing.  States it has been going on off and on all week.  Has been using inhalers with no relief.

## 2018-03-05 NOTE — Discharge Instructions (Addendum)
I suspect [name] was in atrial fibrillation earlier today.  Test in the emergency department were good.  Can take your anxiety medicine as needed.  Return for worsening shortness of breath, chest pain, sweating, any concern.

## 2018-03-05 NOTE — ED Notes (Signed)
Pt reports at times this week it has been feeling as if her heart is racing along with the shortness of breath.  Daughter has checked her pulse a few times and has gotten rates >120 with SBP 80s.  Pt reports her daughter has checked these vitals because she has called because she felt so bad.  Initially when pulse ox placed on pt's finger, HR was reading 144 but by the time placed on monitor HR was 70s.  Pt states she feels better at this time.

## 2018-03-07 NOTE — ED Provider Notes (Signed)
[facility] EMERGENCY DEPARTMENT Provider Note   CSN: [id number] Arrival date & time: 03/05/18  4098     History   Chief Complaint Chief Complaint  Patient presents with  . Shortness of Breath    HPI [name] is a 84 y.o. female.  Level 5 caveat for mild dementia.  Patient complains of intermittent spells of tachycardia and dyspnea.  They are not associate with any activity.  Episodes last a relatively short period of time.  Daughter reports pulse in the 120-140 range with an elevated blood pressure.  No associated chest pain, diaphoresis, nausea.  She is asymptomatic now.     Past Medical History:  Diagnosis Date  . Anxiety   . Atrial fibrillation (HCC)   . COPD (chronic obstructive pulmonary disease) (HCC)   . Depression   . Hyperlipidemia   . Hypertension   . Hypothyroidism   . Mitral regurgitation    Mild  . Palpitations    PAT/EAT/NSVT, normal LVEF  . Type 2 diabetes mellitus [facility])     Patient Active Problem List   Diagnosis Date Noted  . Iron deficiency anemia 05/12/2017  . GERD (gastroesophageal reflux disease) 03/17/2016  . Constipation 10/29/2015  . Gastric erosions 02/03/2015  . Sinus bradycardia 09/26/2014  . Dyspnea on exertion 09/18/2014  . Chronic diastolic heart failure (HCC) 09/18/2014  . Dizziness 09/18/2014  . Atrial flutter with rapid ventricular response (HCC)   . Atrial fibrillation with RVR (HCC) 09/09/2014  . Atrial flutter (HCC) 07/13/2014  . Hyponatremia 07/13/2014  . Diabetes (HCC) 07/13/2014  . Hypothyroidism 07/13/2014  . Tachycardia 07/13/2014  . Dysphagia, pharyngoesophageal phase 04/16/2014  . Neurogenic pain of lower extremity 03/15/2012  . Left bundle branch block 03/02/2011  . RENAL INSUFFICIENCY 05/22/2010  . Essential hypertension, benign 10/09/2009  . Mitral regurgitation 10/09/2009  . HYPERLIPIDEMIA-MIXED 02/12/2009  . Palpitations 02/12/2009    Past Surgical History:  Procedure Laterality Date  .  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 hiatal hernia. Antral erosions status post gastric biopsy.  . ESOPHAGOGASTRODUODENOSCOPY N/A 05/12/2016   Procedure: ESOPHAGOGASTRODUODENOSCOPY (EGD);  Surgeon: Corbin Ade, MD;  Location: [facility] ENDO SU[facility];  Service: Endoscopy;  Laterality: N/A;  730   . MALONEY DILATION N/A 04/17/2014   Procedure: [name] DILATION;  Surgeon: Corbin Ade, MD;  Location: [facility] ENDO SUITE;  Service: Endoscopy;  Laterality: N/A;  [name] DILATION N/A 05/12/2016   Procedure: [name] DILATION;  Surgeon: Corbin Ade, MD;  Location: [facility] ENDO SUITE;  Service: Endoscopy;  Laterality: N/A;  . none       OB History    Gravida  5   Para  5   Term  4   Preterm  1   AB      Living  5     SAB      TAB      Ectopic      Multiple      Live Births               Home Medications    Prior to Admission medications   Medication Sig Start Date End Date Taking? Authorizing Provider  acetaminophen (TYLENOL) 650 MG CR tablet Take 650 mg by mouth as needed for pain.   Yes [provider]  albuterol (PROVENTIL HFA;VENTOLIN HFA) 108 (90 Base) MCG/ACT inhaler Inhale 1-2 puffs into the lungs every 4 (four) hours as needed for  wheezing or shortness of breath.   Yes [provider]  ALPRAZolam (XANAX) 0.25 MG tablet Take 0.25 mg by mouth daily as needed for anxiety or sleep.    Yes [provider]  amiodarone (PACERONE) 200 MG tablet Take 100 mg by mouth daily. 02/14/18  Yes [provider]  atorvastatin (LIPITOR) 40 MG tablet Take 40 mg by mouth daily at 6 PM. 09/27/11  Yes Jonelle Sidle, MD  bisacodyl (DULCOLAX) 5 MG EC tablet Take 5 mg by mouth daily as needed for moderate constipation.   Yes [provider]  bismuth subsalicylate (PEPTO BISMOL) 262 MG/15ML suspension Take 30 mLs by mouth daily as needed for  indigestion.   Yes [provider]  cetirizine (ZYRTEC) 10 MG tablet Take 5-10 mg by mouth daily as needed for allergies or rhinitis.    Yes [provider]  COD LIVER OIL PO Take 15 mLs by mouth daily.   Yes [provider]  ELIQUIS 2.5 MG TABS tablet TAKE ONE TABLET BY MOUTH TWICE DAILY 09/20/17  Yes Jonelle Sidle, MD  furosemide (LASIX) 20 MG tablet TAKE 1 TABLET BY MOUTH EVERY DAY AS NEEDED FOR FLUID 06/23/16  Yes Jonelle Sidle, MD  guaiFENesin-dextromethorphan (ROBITUSSIN DM) 100-10 MG/5ML syrup Take 5 mLs by mouth every 4 (four) hours as needed for cough (SUGAR FREE).   Yes [provider]  levothyroxine (SYNTHROID, LEVOTHROID) 25 MCG tablet Take 25 mcg by mouth daily before breakfast.   Yes [provider]  lisinopril (PRINIVIL,ZESTRIL) 20 MG tablet TAKE ONE TABLET BY MOUTH TWICE DAILY 05/12/17  Yes Jonelle Sidle, MD  metoprolol succinate (TOPROL-XL) 50 MG 24 hr tablet TAKE ONE TABLET BY MOUTH EVERY MORNING immediately following a meal. 05/12/17  Yes Jonelle Sidle, MD  omeprazole (PRILOSEC) 20 MG capsule TAKE 1 CAPSULE BY MOUTH EVERY DAY 09/29/15  Yes Tiffany Kocher, PA-C  polyethylene glycol (MIRALAX / GLYCOLAX) packet Take 17 g 2 (two) times a week by mouth.   Yes [provider]  potassium chloride (MICRO-K) 10 MEQ CR capsule TAKE TWO CAPSULES BY MOUTH DAILY FOR low potassium 04/22/17  Yes [provider]  spironolactone (ALDACTONE) 25 MG tablet TAKE ONE TABLET BY MOUTH EVERY MORNING 10/06/17  Yes Jonelle Sidle, MD  umeclidinium bromide (INCRUSE ELLIPTA) 62.5 MCG/INH AEPB Inhale 1 puff into the lungs daily.   Yes [provider]    Family History Family History  Problem Relation Age of Onset  . Heart failure Mother        Died in her 32s  . Tuberculosis Father        Died at young age  . Cancer Brother   . Arthritis Unknown   . Colon cancer Unknown     Social History Social History    Tobacco Use  . Smoking status: Never Smoker  . Smokeless tobacco: Never Used  Substance Use Topics  . Alcohol use: No    Alcohol/week: 0.0 standard drinks  . Drug use: No     Allergies   Chocolate   Review of Systems Review of Systems  Unable to perform ROS: Dementia     Physical Exam Updated Vital Signs BP (!) 132/100   Pulse (!) 57   Temp 97.7 F (36.5 C) (Oral)   Resp 17   Ht 5\' 6"  (1.676 m)   Wt 56.7 kg   SpO2 100%   BMI 20.18 kg/m   Physical Exam  Constitutional: She is oriented  to person, place, and time. She appears well-developed and well-nourished.  nad  HENT:  Head: Normocephalic and atraumatic.  Eyes: Conjunctivae are normal.  Neck: Neck supple.  Cardiovascular: Normal rate and regular rhythm.  Pulmonary/Chest: Effort normal and breath sounds normal.  Abdominal: Soft. Bowel sounds are normal.  Musculoskeletal: Normal range of motion.  Neurological: She is alert and oriented to person, place, and time.  Skin: Skin is warm and dry.  Psychiatric: She has a normal mood and affect. Her behavior is normal.  Nursing note and vitals reviewed.    ED Treatments / Results  Labs (all labs ordered are listed, but only abnormal results are displayed) Labs Reviewed  BASIC METABOLIC PANEL - Abnormal; Notable for the following components:      Result Value   Glucose, Bld 138 (*)    Creatinine, Ser 1.26 (*)    GFR calc non Af Amer 37 (*)    GFR calc Af Amer 42 (*)    All other components within normal limits  CBC - Abnormal; Notable for the following components:   WBC 3.5 (*)    Hemoglobin 11.5 (*)    All other components within normal limits  PROTIME-INR  I-STAT TROPONIN, ED  I-STAT TROPONIN, ED    EKG EKG Interpretation  Date/Time:  Sunday March 05 2018 08:54:23 EDT Ventricular Rate:  67 PR Interval:    QRS Duration: 131 QT Interval:  432 QTC Calculation: 457 R Axis:   59 Text Interpretation:  Sinus rhythm Probable LVH with secondary  repol abnrm Anterior ST elevation, probably due to LVH Baseline wander in lead(s) II III aVR aVF Confirmed by Donnetta Hutching ([id number]) on 03/05/2018 9:10:02 AM Also confirmed by Donnetta Hutching ([id number])  on 03/05/2018 1:16:16 PM   Radiology Dg Chest 2 View  Result Date: 03/05/2018 CLINICAL DATA:  Shortness of breath, heart racing, intermittent for 1 week. Mild cough and congestion. History of diabetes, hypertension, atrial fibrillation, COPD. EXAM: CHEST - 2 VIEW COMPARISON:  Chest x-rays dated 04/19/2017 and 10/07/2016. FINDINGS: Borderline cardiomegaly is stable. Atherosclerotic changes again noted at the aortic arch. Lungs are hyperexpanded. Lungs are clear. No pleural effusion or pneumothorax seen. No acute or suspicious osseous finding. IMPRESSION: 1. No active cardiopulmonary disease. No evidence of pneumonia or pulmonary edema. 2. Stable borderline cardiomegaly. 3. COPD. 4.  Aortic Atherosclerosis (ICD10-I70.0). Electronically Signed   By: Bary Richard M.D.   On: 03/05/2018 10:53    Procedures Procedures (including critical care time)  Medications Ordered in ED Medications  sodium chloride 0.9 % bolus 500 mL (0 mLs Intravenous Stopped 03/05/18 1118)  albuterol (PROVENTIL) (2.5 MG/3ML) 0.083% nebulizer solution 2.5 mg (2.5 mg Nebulization Given 03/05/18 0951)     Initial Impression / Assessment and Plan / ED Course  I have reviewed the triage vital signs and the nursing notes.  Pertinent labs & imaging results that were available during my care of the patient were reviewed by me and considered in my medical decision making (see chart for details).     Patient and daughter report intermittent bouts of tachycardia with dyspnea.  Patient was observed for 2 to 3 hours emergency department with no episodes of same.  Screening tests including EKG, chest x-ray, delta troponin all negative.  These findings were discussed with the patient and her family.  She will follow-up with her primary  care/cardiologist this week.  Final Clinical Impressions(s) / ED Diagnoses   Final diagnoses:  Dyspnea, unspecified type    ED Discharge Orders  None       Donnetta Hutching, MD 03/07/18 [phone]

## 2018-04-05 ENCOUNTER — Other Ambulatory Visit: Payer: Self-pay | Admitting: Cardiology

## 2018-05-03 ENCOUNTER — Other Ambulatory Visit: Payer: Self-pay | Admitting: Cardiology

## 2018-07-13 ENCOUNTER — Other Ambulatory Visit (HOSPITAL_COMMUNITY): Payer: Self-pay

## 2018-07-17 ENCOUNTER — Inpatient Hospital Stay (HOSPITAL_COMMUNITY): Payer: Medicare Other | Attending: Hematology

## 2018-07-17 DIAGNOSIS — E039 Hypothyroidism, unspecified: Secondary | ICD-10-CM | POA: Diagnosis not present

## 2018-07-17 DIAGNOSIS — D509 Iron deficiency anemia, unspecified: Secondary | ICD-10-CM | POA: Diagnosis not present

## 2018-07-17 DIAGNOSIS — D500 Iron deficiency anemia secondary to blood loss (chronic): Secondary | ICD-10-CM

## 2018-07-17 DIAGNOSIS — I10 Essential (primary) hypertension: Secondary | ICD-10-CM | POA: Insufficient documentation

## 2018-07-17 LAB — COMPREHENSIVE METABOLIC PANEL
ALK PHOS: 61 U/L (ref 38–126)
ALT: 17 U/L (ref 0–44)
AST: 21 U/L (ref 15–41)
Albumin: 3.9 g/dL (ref 3.5–5.0)
Anion gap: 8 (ref 5–15)
BUN: 32 mg/dL — AB (ref 8–23)
CHLORIDE: 105 mmol/L (ref 98–111)
CO2: 23 mmol/L (ref 22–32)
Calcium: 9.2 mg/dL (ref 8.9–10.3)
Creatinine, Ser: 1.11 mg/dL — ABNORMAL HIGH (ref 0.44–1.00)
GFR calc Af Amer: 51 mL/min — ABNORMAL LOW (ref >60)
GFR calc non Af Amer: 44 mL/min — ABNORMAL LOW (ref >60)
Glucose, Bld: 107 mg/dL — ABNORMAL HIGH (ref 70–99)
Potassium: 4.2 mmol/L (ref 3.5–5.1)
Sodium: 136 mmol/L (ref 135–145)
Total Bilirubin: 1 mg/dL (ref 0.3–1.2)
Total Protein: 7.3 g/dL (ref 6.5–8.1)

## 2018-07-17 LAB — CBC WITH DIFFERENTIAL/PLATELET
Abs Immature Granulocytes: 0.01 10*3/uL (ref 0.00–0.07)
Basophils Absolute: 0 10*3/uL (ref 0.0–0.1)
Basophils Relative: 1 %
EOS ABS: 0.1 10*3/uL (ref 0.0–0.5)
EOS PCT: 2 %
HCT: 36.4 % (ref 36.0–46.0)
Hemoglobin: 11.3 g/dL — ABNORMAL LOW (ref 12.0–15.0)
IMMATURE GRANULOCYTES: 0 %
LYMPHS ABS: 1.3 10*3/uL (ref 0.7–4.0)
Lymphocytes Relative: 31 %
MCH: 30.6 pg (ref 26.0–34.0)
MCHC: 31 g/dL (ref 30.0–36.0)
MCV: 98.6 fL (ref 80.0–100.0)
Monocytes Absolute: 0.4 10*3/uL (ref 0.1–1.0)
Monocytes Relative: 9 %
Neutro Abs: 2.3 10*3/uL (ref 1.7–7.7)
Neutrophils Relative %: 57 %
Platelets: 194 10*3/uL (ref 150–400)
RBC: 3.69 MIL/uL — ABNORMAL LOW (ref 3.87–5.11)
RDW: 13.9 % (ref 11.5–15.5)
WBC: 4.1 10*3/uL (ref 4.0–10.5)
nRBC: 0 % (ref 0.0–0.2)

## 2018-07-17 LAB — FERRITIN: FERRITIN: 42 ng/mL (ref 11–307)

## 2018-07-17 LAB — LACTATE DEHYDROGENASE: LDH: 180 U/L (ref 98–192)

## 2018-07-20 ENCOUNTER — Inpatient Hospital Stay (HOSPITAL_COMMUNITY): Payer: Medicare Other | Admitting: Internal Medicine

## 2018-07-20 ENCOUNTER — Encounter (HOSPITAL_COMMUNITY): Payer: Self-pay | Admitting: Internal Medicine

## 2018-07-20 ENCOUNTER — Other Ambulatory Visit: Payer: Self-pay

## 2018-07-20 VITALS — BP 201/80 | HR 65 | Temp 97.8°F | Resp 16 | Wt 132.0 lb

## 2018-07-20 DIAGNOSIS — E039 Hypothyroidism, unspecified: Secondary | ICD-10-CM

## 2018-07-20 DIAGNOSIS — I10 Essential (primary) hypertension: Secondary | ICD-10-CM | POA: Diagnosis not present

## 2018-07-20 DIAGNOSIS — D500 Iron deficiency anemia secondary to blood loss (chronic): Secondary | ICD-10-CM

## 2018-07-20 DIAGNOSIS — D509 Iron deficiency anemia, unspecified: Secondary | ICD-10-CM | POA: Diagnosis not present

## 2018-07-20 NOTE — Progress Notes (Signed)
Diagnosis Iron deficiency anemia due to chronic blood loss - Plan: CBC with Differential, Comprehensive metabolic panel, Lactate dehydrogenase, Ferritin  Staging Cancer Staging No matching staging information was found for the patient.  Assessment and Plan:  1.  Iron deficiency anemia.  Patient was last treated with IV iron in January 2019.   Labs done 07/17/2018 reviewed and showed WBC 4.1 HB 11.3 plts 194,000.  Chemistries WNL with K+ 4.2 Cr 1.11 and normal LFTs.  Ferritin with 42.  Pt has option of repeat IV iron as iron levels are trending downward and it has been over a year since last IV iron.  Pt desires to have repeat blood work and this is set up for 10/2018 and she will follow-up to go over results.  Previous SPEP was negative.    2.  Leukopenia.  Resolved.  Likely a normal varient.   3.  HTN.  BP is elevated at 201/80.  Family member reports pt has White coat hypertension.  Follow-up with PCP for ongoing monitoring.    4.  Hypothyroidism.  Pt on synthroid.  Follow-up with PCP.    5.  RI.  Cr 1.  Previously SPEP negative.    Interval History:  Historical data obtained from the note of 09/14/2017.  85 y.o. female previously followed by Dr. Janyth Contes for abnormal weight loss.  Patient states she is lost about 30 pounds unintentionally since June 2017.  She states her appetite is very good and she cooks her own meals and they are very nutritious.  She lives alone and performs most of her own ADLs.  Her daughter does have a housekeeper that comes out to help with major cleaning for the patient.  Patient had a CT abdomen and pelvis without contrast performed on 02/02/2017 for evaluation for weight loss and was noted to have increased attenuation in the liver of unknown certain significance, chronic calcification of the left adrenal gland, minimal right lower lobe infiltrate, otherwise no acute abnormalities.  Patient had a chest x-ray performed on 04/19/2017 which showed no acute cardiopulmonary  disease.  Patient also had a complete abdominal ultrasound performed on 04/19/2017 which was negative for gallstones, no ductal dilatation, normal echogenicity in the liver parenchyma and no focal abnormalities were seen in the liver.  Her most recent blood work from 04/13/2017 demonstrated CMP with a creatinine of 1.58, BUN 34, and GFR 34, AST 38, ALT 39, hemoglobin A1c 5.3, sed rate 34.  CBC on 04/13/2017 demonstrated WBC 3.9K, hemoglobin 8.8 g/dL, hematocrit 19.1%, MCV 88.4, platelet count 214K, differential was normal.Pt had CT abdomen and pelvis done 02/02/2017 that showed  IMPRESSION: Increased attenuation in the liver is noted of uncertain significance. The possibility of an underlying deposition disorder could not be totally excluded.  Minimal right lower lobe infiltrate.  Chronic calcification of the left adrenal gland.  No acute abnormality is noted.  She was treated with IV iron on 05/27/2017 and 06/03/2017.    Current Status: Patient is seen today for follow-up.  She is here to go over labs.  She denies any blood in stool or urine.    Problem List Patient Active Problem List   Diagnosis Date Noted  . Iron deficiency anemia [D50.9] 05/12/2017  . GERD (gastroesophageal reflux disease) [K21.9] 03/17/2016  . Constipation [K59.00] 10/29/2015  . Gastric erosions [K25.9] 02/03/2015  . Sinus bradycardia [R00.1] 09/26/2014  . Dyspnea on exertion [R06.09] 09/18/2014  . Chronic diastolic heart failure (HCC) [I50.32] 09/18/2014  . Dizziness [R42] 09/18/2014  .  Atrial flutter with rapid ventricular response (HCC) [I48.92]   . Atrial fibrillation with RVR (HCC) [I48.91] 09/09/2014  . Atrial flutter (HCC) [I48.92] 07/13/2014  . Hyponatremia [E87.1] 07/13/2014  . Diabetes (HCC) [E11.9] 07/13/2014  . Hypothyroidism [E03.9] 07/13/2014  . Tachycardia [R00.0] 07/13/2014  . Dysphagia, pharyngoesophageal phase [R13.14] 04/16/2014  . Neurogenic pain of lower extremity [M79.2] 03/15/2012  .  Left bundle branch block [I44.7] 03/02/2011  . RENAL INSUFFICIENCY [N25.9] 05/22/2010  . Essential hypertension, benign [I10] 10/09/2009  . Mitral regurgitation [I34.0] 10/09/2009  . HYPERLIPIDEMIA-MIXED [E78.5] 02/12/2009  . Palpitations [R00.2] 02/12/2009    Past Medical History Past Medical History:  Diagnosis Date  . Anxiety   . Atrial fibrillation (HCC)   . COPD (chronic obstructive pulmonary disease) (HCC)   . Depression   . Hyperlipidemia   . Hypertension   . Hypothyroidism   . Mitral regurgitation    Mild  . Palpitations    PAT/EAT/NSVT, normal LVEF  . Type 2 diabetes mellitus [facility])     Past Surgical History Past Surgical History:  Procedure Laterality Date  . 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 hiatal hernia. Antral erosions status post gastric biopsy.  . ESOPHAGOGASTRODUODENOSCOPY N/A 05/12/2016   Procedure: ESOPHAGOGASTRODUODENOSCOPY (EGD);  Surgeon: Corbin Ade, MD;  Location: [facility];  Service: Endoscopy;  Laterality: N/A;  730   . MALONEY DILATION N/A 04/17/2014   Procedure: [name] DILATION;  Surgeon: Corbin Ade, MD;  Location: [facility];  Service: Endoscopy;  Laterality: N/A;  [name] DILATION N/A 05/12/2016   Procedure: [name] DILATION;  Surgeon: Corbin Ade, MD;  Location: [facility];  Service: Endoscopy;  Laterality: N/A;  . none      Family History Family History  Problem Relation Age of Onset  . Heart failure Mother        Died in her 66s  . Tuberculosis Father        Died at young age  . Cancer Brother   . Arthritis Unknown   . Colon cancer Unknown      Social History  reports that she has never smoked. She has never used smokeless tobacco. She reports that she does not drink alcohol or use drugs.  Medications  Current Outpatient Medications:  .  acetaminophen (TYLENOL) 650 MG CR tablet, Take 650 mg  by mouth as needed for pain., Disp: , Rfl:  .  albuterol (PROVENTIL HFA;VENTOLIN HFA) 108 (90 Base) MCG/ACT inhaler, Inhale 1-2 puffs into the lungs every 4 (four) hours as needed for wheezing or shortness of breath., Disp: , Rfl:  .  ALPRAZolam (XANAX) 0.25 MG tablet, Take 0.25 mg by mouth daily as needed for anxiety or sleep. , Disp: , Rfl:  .  amiodarone (PACERONE) 200 MG tablet, Take 100 mg by mouth daily., Disp: , Rfl: 6 .  atorvastatin (LIPITOR) 40 MG tablet, Take 40 mg by mouth daily at 6 PM., Disp: , Rfl:  .  bisacodyl (DULCOLAX) 5 MG EC tablet, Take 5 mg by mouth daily as needed for moderate constipation., Disp: , Rfl:  .  bismuth subsalicylate (PEPTO BISMOL) 262 MG/15ML suspension, Take 30 mLs by mouth daily as needed for indigestion., Disp: , Rfl:  .  cetirizine (ZYRTEC) 10 MG tablet, Take 5-10 mg by mouth daily as needed for allergies or rhinitis. , Disp: , Rfl:  .  cloNIDine (CATAPRES) 0.1 MG tablet, TAKE 1 TABLET BY  MOUTH UP TO THREE TIMES DAILY IF BLOOD PRESSURE IS GREATER THAN 140/90, Disp: , Rfl:  .  COD LIVER OIL PO, Take 15 mLs by mouth daily., Disp: , Rfl:  .  EASY COMFORT LANCETS MISC, TO test blood glucose ONCE daily, Disp: , Rfl:  .  ELIQUIS 2.5 MG TABS tablet, TAKE 1 TABLET BY MOUTH TWICE DAILY, Disp: 180 tablet, Rfl: 1 .  furosemide (LASIX) 20 MG tablet, TAKE 1 TABLET BY MOUTH EVERY DAY AS NEEDED FOR FLUID, Disp: 30 tablet, Rfl: 6 .  guaiFENesin-dextromethorphan (ROBITUSSIN DM) 100-10 MG/5ML syrup, Take 5 mLs by mouth every 4 (four) hours as needed for cough (SUGAR FREE)., Disp: , Rfl:  .  Lancet Devices (ADJUSTABLE LANCING DEVICE) MISC, USE TO check blood glucose, Disp: , Rfl:  .  levothyroxine (SYNTHROID, LEVOTHROID) 25 MCG tablet, Take 25 mcg by mouth daily before breakfast., Disp: , Rfl:  .  lisinopril (PRINIVIL,ZESTRIL) 20 MG tablet, TAKE ONE TABLET BY MOUTH TWICE DAILY, Disp: 180 tablet, Rfl: 3 .  metoprolol succinate (TOPROL-XL) 50 MG 24 hr tablet, TAKE ONE TABLET BY  MOUTH EVERY MORNING immediately following a meal., Disp: 90 tablet, Rfl: 3 .  omeprazole (PRILOSEC) 20 MG capsule, TAKE 1 CAPSULE BY MOUTH EVERY DAY, Disp: 30 capsule, Rfl: 5 .  polyethylene glycol (MIRALAX / GLYCOLAX) packet, Take 17 g 2 (two) times a week by mouth., Disp: , Rfl:  .  potassium chloride (MICRO-K) 10 MEQ CR capsule, TAKE TWO CAPSULES BY MOUTH DAILY FOR low potassium, Disp: , Rfl: 5 .  spironolactone (ALDACTONE) 25 MG tablet, TAKE 1 TABLET BY MOUTH EVERY MORNING, Disp: 90 tablet, Rfl: 1 .  umeclidinium bromide (INCRUSE ELLIPTA) 62.5 MCG/INH AEPB, Inhale 1 puff into the lungs daily., Disp: , Rfl:   Allergies Chocolate  Review of Systems Review of Systems - Oncology ROS negative   Physical Exam  Vitals Wt Readings from Last 3 Encounters:  07/20/18 132 lb (59.9 kg)  03/05/18 125 lb (56.7 kg)  02/16/18 125 lb (56.7 kg)   Temp Readings from Last 3 Encounters:  07/20/18 97.8 F (36.6 C) (Oral)  03/05/18 97.7 F (36.5 C) (Oral)  01/12/18 (!) 97.5 F (36.4 C) (Oral)   BP Readings from Last 3 Encounters:  07/20/18 (!) 201/80  03/05/18 (!) 132/100  02/16/18 110/62   Pulse Readings from Last 3 Encounters:  07/20/18 65  03/05/18 (!) 57  02/16/18 (!) 55   Constitutional: Well-developed, well-nourished, and in no distress.   HENT: Head: Normocephalic and atraumatic.  Mouth/Throat: No oropharyngeal exudate. Mucosa moist. Eyes: Pupils are equal, round, and reactive to light. Conjunctivae are normal. No scleral icterus.  Neck: Normal range of motion. Neck supple. No JVD present.  Cardiovascular: Normal rate, regular rhythm and normal heart sounds.  Exam reveals no gallop and no friction rub.   No murmur heard. Pulmonary/Chest: Effort normal and breath sounds normal. No respiratory distress. No wheezes.No rales.  Abdominal: Soft. Bowel sounds are normal. No distension. There is no tenderness. There is no guarding.  Musculoskeletal: No edema or tenderness.   Lymphadenopathy: No cervical, axillary or supraclavicular adenopathy.  Neurological: Alert and oriented to person, place, and time. No cranial nerve deficit.  Skin: Skin is warm and dry. No rash noted. No erythema. No pallor.  Psychiatric: Affect and judgment normal.   Labs No visits with results within 3 Day(s) from this visit.  Latest known visit with results is:  Appointment on 07/17/2018  Component Date Value Ref Range Status  .  WBC 07/17/2018 4.1  4.0 - 10.5 K/uL Final  . RBC 07/17/2018 3.69* 3.87 - 5.11 MIL/uL Final  . Hemoglobin 07/17/2018 11.3* 12.0 - 15.0 g/dL Final  . HCT 16/02/9603 36.4  36.0 - 46.0 % Final  . MCV 07/17/2018 98.6  80.0 - 100.0 fL Final  . MCH 07/17/2018 30.6  26.0 - 34.0 pg Final  . MCHC 07/17/2018 31.0  30.0 - 36.0 g/dL Final  . RDW 54/01/8118 13.9  11.5 - 15.5 % Final  . Platelets 07/17/2018 194  150 - 400 K/uL Final  . nRBC 07/17/2018 0.0  0.0 - 0.2 % Final  . Neutrophils Relative % 07/17/2018 57  % Final  . Neutro Abs 07/17/2018 2.3  1.7 - 7.7 K/uL Final  . Lymphocytes Relative 07/17/2018 31  % Final  . Lymphs Abs 07/17/2018 1.3  0.7 - 4.0 K/uL Final  . Monocytes Relative 07/17/2018 9  % Final  . Monocytes Absolute 07/17/2018 0.4  0.1 - 1.0 K/uL Final  . Eosinophils Relative 07/17/2018 2  % Final  . Eosinophils Absolute 07/17/2018 0.1  0.0 - 0.5 K/uL Final  . Basophils Relative 07/17/2018 1  % Final  . Basophils Absolute 07/17/2018 0.0  0.0 - 0.1 K/uL Final  . Immature Granulocytes 07/17/2018 0  % Final  . Abs Immature Granulocytes 07/17/2018 0.01  0.00 - 0.07 K/uL Final   Performed at [facility], [address] [place], [place] [address]  . Sodium 07/17/2018 136  135 - 145 mmol/L Final  . Potassium 07/17/2018 4.2  3.5 - 5.1 mmol/L Final  . Chloride 07/17/2018 105  98 - 111 mmol/L Final  . CO2 07/17/2018 23  22 - 32 mmol/L Final  . Glucose, Bld 07/17/2018 107* 70 - 99 mg/dL Final  . BUN 95/62/1308 32* 8 - 23 mg/dL Final  . Creatinine, Ser  07/17/2018 1.11* 0.44 - 1.00 mg/dL Final  . Calcium 65/78/4696 9.2  8.9 - 10.3 mg/dL Final  . Total Protein 07/17/2018 7.3  6.5 - 8.1 g/dL Final  . Albumin 29/52/8413 3.9  3.5 - 5.0 g/dL Final  . AST 24/40/1027 21  15 - 41 U/L Final  . ALT 07/17/2018 17  0 - 44 U/L Final  . Alkaline Phosphatase 07/17/2018 61  38 - 126 U/L Final  . Total Bilirubin 07/17/2018 1.0  0.3 - 1.2 mg/dL Final  . GFR calc non Af Amer 07/17/2018 44* >60 mL/min Final  . GFR calc Af Amer 07/17/2018 51* >60 mL/min Final  . Anion gap 07/17/2018 8  5 - 15 Final   Performed at [facility], [address], [place], [place] [address]  . LDH 07/17/2018 180  98 - 192 U/L Final   Performed at [facility], [address], [place], [place] [address]  . Ferritin 07/17/2018 42  11 - 307 ng/mL Final   Performed at [facility], [address], [place], [place] [address]     Pathology Orders Placed This Encounter  Procedures  . CBC with Differential    Standing Status:   Future    Standing Expiration Date:   07/21/2019  . Comprehensive metabolic panel    Standing Status:   Future    Standing Expiration Date:   07/21/2019  . Lactate dehydrogenase    Standing Status:   Future    Standing Expiration Date:   07/21/2019  . Ferritin    Standing Status:   Future    Standing Expiration Date:   07/21/2019  Zoila Shutter MD

## 2018-08-11 ENCOUNTER — Telehealth: Payer: Self-pay | Admitting: *Deleted

## 2018-08-11 NOTE — Telephone Encounter (Signed)
Pt called to cancel 3/25 appt with Dr Diona Browner. History reviewed. No symptoms to suggest any unstable cardiac conditions. Based on discussion, with current pandemic situation, we have postponed 08/14/18 appointment until 10/26/2018. If symptoms change, pt has been instructed to contact our office

## 2018-08-16 ENCOUNTER — Ambulatory Visit: Payer: Self-pay | Admitting: Cardiology

## 2018-08-30 ENCOUNTER — Other Ambulatory Visit: Payer: Self-pay | Admitting: *Deleted

## 2018-08-30 MED ORDER — AMIODARONE HCL 200 MG PO TABS: 100.0000 mg | ORAL_TABLET | Freq: Every day | ORAL | 1 refills | Status: DC

## 2018-10-01 ENCOUNTER — Other Ambulatory Visit: Payer: Self-pay | Admitting: Cardiology

## 2018-10-26 ENCOUNTER — Encounter: Payer: Self-pay | Admitting: *Deleted

## 2018-10-26 ENCOUNTER — Ambulatory Visit: Payer: Medicare Other | Admitting: Cardiology

## 2018-10-31 ENCOUNTER — Other Ambulatory Visit (HOSPITAL_COMMUNITY): Payer: Medicare Other

## 2018-11-09 ENCOUNTER — Other Ambulatory Visit (HOSPITAL_COMMUNITY): Payer: Self-pay

## 2018-11-16 ENCOUNTER — Ambulatory Visit (HOSPITAL_COMMUNITY): Payer: Medicare Other | Admitting: Hematology

## 2018-11-23 ENCOUNTER — Other Ambulatory Visit (HOSPITAL_COMMUNITY): Payer: Medicare Other

## 2018-11-30 ENCOUNTER — Ambulatory Visit (HOSPITAL_COMMUNITY): Payer: Medicare Other | Admitting: Hematology

## 2018-12-07 ENCOUNTER — Telehealth: Payer: Self-pay | Admitting: *Deleted

## 2018-12-07 NOTE — Telephone Encounter (Signed)
Went to Dr. Luan Pulling a couple of weeks ago.  Had infection in throat.  Pt is having tightness in throat when swallowing food.  She says this has been going on before and after she saw Dr. Luan Pulling.  Had EGD in 2017.  Pt wants to know if it could be something else going on besides the infection in her throat.  [phone]

## 2018-12-08 NOTE — Telephone Encounter (Signed)
Spoke with pt. She has several appointments coming up and doesn't want to come in the office until she gets her other appointments out of the way. Pt was offered a virtual visit and she wants to hold off. Pt is aware that she hasn't been seen since 2018 and can call anytime she's ready for an appointment.

## 2018-12-11 ENCOUNTER — Telehealth: Payer: Self-pay | Admitting: *Deleted

## 2018-12-11 NOTE — Telephone Encounter (Signed)
Spoke with pt on Friday. Pt was advised to schedule an appointment since it's been two years since she's been seen. Pt didn't want to make an appointment due to having other appointments with providers. Pt called this morning with c/o swallowing and dryness of her mouth. Pt stated that this has been going on for over 1 month. Pt's appetite has decreased some. Pt is very concerned that she can't swallow well and wants an appointment ASAP. Please advise.

## 2018-12-11 NOTE — Telephone Encounter (Signed)
Pt having problems swallowing.  Her throat doesn't feel any better.  Concerned.  Wants to see if we can work her in today.  [phone]

## 2018-12-12 NOTE — Telephone Encounter (Signed)
Ok to schedule an appointment with first available provider (may use Urgent slow as needed)

## 2018-12-12 NOTE — Telephone Encounter (Signed)
Tried calling pt. Number was busy.  

## 2018-12-12 NOTE — Telephone Encounter (Signed)
Tried calling pt, number was busy, will call pt back.

## 2018-12-13 NOTE — Telephone Encounter (Signed)
Noted  

## 2018-12-13 NOTE — Progress Notes (Signed)
Referring Provider: Gareth MorganKnowlton, Steve, MD Primary Care Physician:  Gareth MorganKnowlton, Steve, MD Primary GI Physician: Dr. Jena Gaussourk  Chief Complaint  Patient presents with  . Dysphagia    x 2-3 weeks    HPI:   [name] is a 85 y.o. female with past medical history significant for T2DM, HTN, HLD, hypothyroidism, a. fib, mitral regurgitation, COPD, and GERD, iron deficiency anemia followed by oncology, renal insufficiency. She is presenting today for dysphagia.  EGD December 2017 with moderate Schatzki's ring s/p dilation and medium-sized hiatal hernia. Otherwise normal. Patient also had EGD in 2015 with critical Schatzki ring that was dilated at that time as well.   Patient was last seen in our office in November 2018 for GERD, dysphagia, and constipation. She was doing well at that time and recommended continuing her current medications including omeprazole 20 mg and women's laxative.   She called our office on 12/07/18. Phone note ststed she saw Dr. Juanetta GoslingHawkins a couple weeks back and had an infection in her throat. She was also having tightness in throat when swallowing food that has been going on before and after she saw Dr. Juanetta GoslingHawkins. Present for over 1 month.   Today patient is accompanied by her daughter as she has some trouble remembering. Patient states she has a dry throat and a tightness in her throat as well as dry mouth. When she drinks water, the dryness and tight feeling goes away for a short while, then return. States she saw Dr. Juanetta GoslingHawkins who told her she had an infection in her throat and gave her antibiotics, amoxicillin and methylprednisolone for 10 days. Medications were dated 10/31/18. Her dry throat and throat tightness started just before seeing Dr. Juanetta GoslingHawkins. States she has a lot of congestion and has been doing a lot of coughing to clear the mucous out of her throat for the last 1-2 months. Daughter states she thought she saw Dr. Juanetta GoslingHawkins for worsening shortness of breath. Patient feels  throat started to improve while on antibiotics, but is starting to get worse again. Doesn't feel like it is affecting her breathing. No trouble swallowing foods or liquids. No pain with swallowing. Doesn't feel like foods get hung at all. She takes her pills in applesauce, but has been doing this for years. No choking with eating. Says her symptoms now do not feel the same as when she had her esophageal dilations in the past. Denies heartburn or reflux symptoms, nausea, vomiting, or abdominal pain. Appetite is still good. No unintentional weight loss. She continues to live by herself and cooks for herself.   Bowel are moving well. Takes women's stool softeners twice a week. No hematochezia or melena.    Blood pressure is quite elevated today. She is without headache, blurry vision, chest pain, palpitations, lightheadedness, dizziness, or feeling like she will pass out. No worsening shortness of breath at this time. Patient had not taken her BP medication. Also states she gets nervous at the doctors office. Daughter states she has been having trouble with her BP lately. They have seen her PCP who started a new medication recently which daughter states has been helping. Patient had BP medication with her and was advised to go ahead and take it during our visit. Also advised to call PCP.    Denies fever, chills.   Past Medical History:  Diagnosis Date  . Anxiety   . Atrial fibrillation (HCC)   . COPD (chronic obstructive pulmonary disease) (HCC)   . Depression   .  Hyperlipidemia   . Hypertension   . Hypothyroidism   . Mitral regurgitation    Mild  . Palpitations    PAT/EAT/NSVT, normal LVEF  . Type 2 diabetes mellitus (HCC)     Past Surgical History:  Procedure Laterality Date  . 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 hiatal hernia. Antral erosions status post gastric biopsy.  .  ESOPHAGOGASTRODUODENOSCOPY N/A 05/12/2016   Procedure: ESOPHAGOGASTRODUODENOSCOPY (EGD);  Surgeon: Corbin Adeobert M Rourk, MD;  Location: [facility];  Service: Endoscopy;  Laterality: N/A;  730   . MALONEY DILATION N/A 04/17/2014   Procedure: Elease HashimotoMALONEY DILATION;  Surgeon: Corbin Adeobert M Rourk, MD;  Location: [facility];  Service: Endoscopy;  Laterality: N/A;  [name]. MALONEY DILATION N/A 05/12/2016   Procedure: Elease HashimotoMALONEY DILATION;  Surgeon: Corbin Adeobert M Rourk, MD;  Location: [facility];  Service: Endoscopy;  Laterality: N/A;  . none      Current Outpatient Medications  Medication Sig Dispense Refill  . acetaminophen (TYLENOL) 650 MG CR tablet Take 650 mg by mouth as needed for pain.    Marland Kitchen. albuterol (PROVENTIL HFA;VENTOLIN HFA) 108 (90 Base) MCG/ACT inhaler Inhale 1-2 puffs into the lungs every 4 (four) hours as needed for wheezing or shortness of breath.    . ALPRAZolam (XANAX) 0.25 MG tablet Take 0.25 mg by mouth at bedtime as needed for anxiety.    Marland Kitchen. amiodarone (PACERONE) 200 MG tablet Take 0.5 tablets (100 mg total) by mouth daily. 45 tablet 1  . antiseptic oral rinse (BIOTENE) LIQD 15 mLs by Mouth Rinse route as needed for dry mouth.    Marland Kitchen. atorvastatin (LIPITOR) 40 MG tablet Take 40 mg by mouth daily at 6 PM.    . bisacodyl (DULCOLAX) 5 MG EC tablet Take 5 mg by mouth daily as needed for moderate constipation.    . bismuth subsalicylate (PEPTO BISMOL) 262 MG/15ML suspension Take 30 mLs by mouth daily as needed for indigestion.    . cetirizine (ZYRTEC) 10 MG tablet Take 5-10 mg by mouth daily as needed for allergies or rhinitis.     . cloNIDine (CATAPRES) 0.1 MG tablet TAKE 1 TABLET BY MOUTH UP TO THREE TIMES DAILY IF BLOOD PRESSURE IS GREATER THAN 140/90    . COD LIVER OIL PO Take 15 mLs by mouth daily.    Marland Kitchen. ELIQUIS 2.5 MG TABS tablet TAKE 1 TABLET BY MOUTH TWICE DAILY 180 tablet 1  . furosemide (LASIX) 20 MG tablet TAKE 1 TABLET BY MOUTH EVERY DAY AS NEEDED FOR FLUID 30 tablet 6  .  guaiFENesin-dextromethorphan (ROBITUSSIN DM) 100-10 MG/5ML syrup Take 5 mLs by mouth every 4 (four) hours as needed for cough (SUGAR FREE).    Marland Kitchen. levothyroxine (SYNTHROID, LEVOTHROID) 25 MCG tablet Take 25 mcg by mouth daily before breakfast.    . lisinopril (PRINIVIL,ZESTRIL) 20 MG tablet TAKE ONE TABLET BY MOUTH TWICE DAILY 180 tablet 3  . metoprolol succinate (TOPROL-XL) 50 MG 24 hr tablet TAKE ONE TABLET BY MOUTH EVERY MORNING immediately following a meal. 90 tablet 3  . omeprazole (PRILOSEC) 20 MG capsule TAKE 1 CAPSULE BY MOUTH EVERY DAY 30 capsule 5  . polyethylene glycol (MIRALAX / GLYCOLAX) packet Take 17 g 2 (two) times a week by mouth.    . potassium chloride (MICRO-K) 10 MEQ CR capsule TAKE TWO CAPSULES BY MOUTH DAILY FOR low potassium  5  . spironolactone (ALDACTONE) 25 MG tablet TAKE 1 TABLET BY  MOUTH EVERY MORNING 90 tablet 1  . umeclidinium bromide (INCRUSE ELLIPTA) 62.5 MCG/INH AEPB Inhale 1 puff into the lungs daily.    Marland Kitchen EASY COMFORT LANCETS MISC TO test blood glucose ONCE daily    . Lancet Devices (ADJUSTABLE LANCING DEVICE) MISC USE TO check blood glucose     No current facility-administered medications for this visit.     Allergies as of 12/14/2018 - Review Complete 12/14/2018  Allergen Reaction Noted  . Chocolate  03/10/2014    Family History  Problem Relation Age of Onset  . Heart failure Mother        Died in her 65s  . Tuberculosis Father        Died at young age  . Cancer Brother   . Arthritis Other   . Colon cancer Other     Social History   Socioeconomic History  . Marital status: Married    Spouse name: Not on file  . Number of children: Not on file  . Years of education: 76  . Highest education level: Not on file  Occupational History  . Occupation: Retired    Fish farm manager: RETIRED  Social Needs  . Financial resource strain: Not on file  . Food insecurity    Worry: Not on file    Inability: Not on file  . Transportation needs    Medical: Not  on file    Non-medical: Not on file  Tobacco Use  . Smoking status: Never Smoker  . Smokeless tobacco: Never Used  Substance and Sexual Activity  . Alcohol use: No    Alcohol/week: 0.0 standard drinks  . Drug use: No  . Sexual activity: Never  Lifestyle  . Physical activity    Days per week: Not on file    Minutes per session: Not on file  . Stress: Not on file  Relationships  . Social Herbalist on phone: Not on file    Gets together: Not on file    Attends religious service: Not on file    Active member of club or organization: Not on file    Attends meetings of clubs or organizations: Not on file    Relationship status: Not on file  Other Topics Concern  . Not on file  Social History Narrative  . Not on file    Review of Systems: Gen: Denies fever, chills CV: See HPI Resp: Admits to shortness of breath. When she lays down, her breathing is some worse.  GI: See HPI Derm: Denies rash, itching, dry skin Psych: Denies depression, anxiety Heme: Denies bruising, bleeding  Physical Exam: BP (!) 214/63   Pulse 63   Temp (!) 96.6 F (35.9 C) (Oral)   Ht 5\' 6"  (1.676 m)   Wt 131 lb 3.2 oz (59.5 kg)   BMI 21.18 kg/m  General:   Alert and oriented. No distress noted. Pleasant and cooperative. Has difficulty with her memory.  Head:  Normocephalic and atraumatic. Eyes:  Conjuctiva clear without scleral icterus. Mouth:  Oral mucosa pink and moist (patient has been drinking water during visit). No lesions. Tongue and throat are without any white plaques. No erythema of the throat.  Throat: No enlarged lymph nodes, thyromegaly or masses appreciated.   Heart:  S1, S2 present without murmurs appreciated. Lungs:  Clear to auscultation bilaterally. No wheezes, rales, or rhonchi. No distress. When laying down patient stated her breathing gets a little worse when she lays down, but that she was ok at this  time.  Abdomen:  +BS, soft, non-tender and non-distended. No rebound  or guarding. No HSM or masses noted. Msk:  Symmetrical without gross deformities. Normal posture. Extremities:  With 2+ below the knee pitting edema. Patient states she hasn't taken her fluid pill today.  Neurologic:  Alert and  oriented x4.  Psych:  Alert and cooperative. Normal mood and affect.

## 2018-12-13 NOTE — Telephone Encounter (Signed)
Pt has ov scheduled for 12/14/2018.

## 2018-12-14 ENCOUNTER — Ambulatory Visit: Payer: Medicare Other | Admitting: Gastroenterology

## 2018-12-14 ENCOUNTER — Other Ambulatory Visit: Payer: Self-pay

## 2018-12-14 ENCOUNTER — Encounter: Payer: Self-pay | Admitting: Gastroenterology

## 2018-12-14 DIAGNOSIS — R6889 Other general symptoms and signs: Secondary | ICD-10-CM

## 2018-12-14 DIAGNOSIS — R682 Dry mouth, unspecified: Secondary | ICD-10-CM | POA: Diagnosis not present

## 2018-12-14 DIAGNOSIS — R0989 Other specified symptoms and signs involving the circulatory and respiratory systems: Secondary | ICD-10-CM

## 2018-12-14 DIAGNOSIS — J392 Other diseases of pharynx: Secondary | ICD-10-CM | POA: Diagnosis not present

## 2018-12-14 NOTE — Assessment & Plan Note (Addendum)
85 y.o. female presenting with 1.5-2 months of throat and mouth dryness with associated feeling of throat tightness that resolves with drinking water but soon returns. Recently saw Dr. Luan Pulling in early June around the time this started and was placed on amoxicillin and methylprednisolone for what patient says was a throat infection. Daughter states she though patient saw Dr. Luan Pulling for worsening shortness of breath, but she wasn't at the visit. Patient has had a lot of coughing and clearing her throat of mucous over the last 1-2 months. Denies any dysphagia to foods or liquids. Pills are taken in applesauce, but has been for years. Appetite is good. No unintentional weight loss. Not getting choked when eating. Doesn't feel the throat tightness affects her breathing. No signs of candida in the mouth or throat, no erythema in the throat. No throat masses, thyromegaly, or adenopathy appreciated on exam. EGD in 2017 with moderate Schatzki's ring s/p dilation and medium-sized hiatal hernia. Otherwise normal.   I suspect patients dry mouth/throat and associated sensation of throat tightness is likely related to her ongoing congestion as patient describes doing a lot of coughing and clearing of mucous. I do not suspect an esophageal structure or dysmotility as symptoms are improved with water and patient isn't having any true dysphagia; therefore, we will not pursue EGD at this time as I would not want to put this 85 y.o. though a procedure unnecessarily.   I have advised patient follow-up on these symptoms when she sees Dr. Luan Pulling tomorrow. I am requesting notes from her recent visit in June as well as tomorrows visit as the patient can not remember what the doctors are telling her very well.  She is to continue drinking plenty of water to keep her throat clear of mucous.  We will follow-up with her in 2 months. She was instructed to call if she begins to develop worsening of symptoms or starts having difficulty  swallowing. If symptoms still present at next visit, will likely consider BPE.   Patient also instructed to call PCP about her elevated BP.

## 2018-12-14 NOTE — Assessment & Plan Note (Signed)
Assessed under throat dryness

## 2018-12-14 NOTE — Assessment & Plan Note (Signed)
Addressed under throat dryness

## 2018-12-14 NOTE — Patient Instructions (Addendum)
Please follow-up with Dr. Luan Pulling regarding your congestion and throat dryness/tightness. We will also request records from your recent visit with Dr. Luan Pulling.   Continue to drink plenty of water as the congestion you are having may be playing a role in the dryness and tightness in your throat.   We will plan to see you back in 2 months. Please call if you have worsening of symptoms or develop difficulty swallowing.  Please follow-up with your PCP about your blood pressure.    Aliene Altes, PA-C [facility]

## 2018-12-18 NOTE — Progress Notes (Signed)
cc'd to pcp 

## 2019-01-16 ENCOUNTER — Ambulatory Visit: Payer: Medicare Other | Admitting: Cardiology

## 2019-01-17 ENCOUNTER — Ambulatory Visit: Payer: Medicare Other | Admitting: Cardiology

## 2019-01-26 ENCOUNTER — Telehealth: Payer: Self-pay | Admitting: Cardiology

## 2019-01-26 NOTE — Telephone Encounter (Signed)
Daughter called stating that insurance company told them to ask [place] to send letter saying Tier plan is too expensive?  She said that patient can not afford medication

## 2019-01-30 NOTE — Telephone Encounter (Signed)
Spoke with Lelon Frohlich (daughter) - said she was initially paying $45 --> $98 --> and then up to $120.  Was told that she was in the gap or doughnut whole.  Not sure that the tiering exception will help with this.  Phone number supplied for the [employer].  She will start there & call back if need further assistance.

## 2019-01-31 ENCOUNTER — Ambulatory Visit: Payer: Medicare Other | Admitting: Gastroenterology

## 2019-01-31 ENCOUNTER — Other Ambulatory Visit: Payer: Self-pay

## 2019-01-31 ENCOUNTER — Encounter: Payer: Self-pay | Admitting: Gastroenterology

## 2019-01-31 DIAGNOSIS — R1310 Dysphagia, unspecified: Secondary | ICD-10-CM | POA: Diagnosis not present

## 2019-01-31 NOTE — Patient Instructions (Signed)
1. Be sure to take small bites and chew your food thoroughly.  2. Use plenty of liquid to wash your food and pills down.  3. Avoid dry foods and hard foods.  4. Sit upright at least 30 minutes after you take your medications. 5. Sit upright at least 2 hours after you eat or drink.  6. Continue omeprazole once daily.  7. Call if you decide to pursue xray of your esophagus or upper endoscopy to evaluate your swallowing concerns.    Dysphagia Eating Plan, Bite Size Food This diet plan is for people with moderate swallowing problems who have transitioned from pureed and minced foods. Bite size foods are soft and cut into small chunks so that they can be swallowed safely. On this eating plan, you may be instructed to drink liquids that are thickened. Work with your health care provider and your diet and nutrition specialist (dietitian) to make sure that you are following the diet safely and getting all the nutrients you need. What are tips for following this plan? General guidelines for foods   You may eat foods that are tender, soft, and moist.  Always test food texture before taking a bite. Poke food with a fork or spoon to make sure it is tender.  Food should be easy to cut and shew. Avoid large pieces of food that require a lot of chewing.  Take small bites. Each bite should be smaller than your thumb nail (about 56mm by 15 mm).  If you were on pureed and minced food diet plans, you may eat any of the foods included in those diets.  Avoid foods that are very dry, hard, sticky, chewy, coarse, or crunchy.  If instructed by your health care provider, thicken liquids. Follow your health care provider's instructions for what products to use, how to do this, and to what thickness. ? Your health care provider may recommend using a commercial thickener, rice cereal, or potato flakes. Ask your health care provider to recommend thickeners. ? Thickened liquids are usually a "pudding-like"  consistency, or they may be as thick as honey or thick enough to eat with a spoon. Cooking  To moisten foods, you may add liquids while you are blending, mashing, or grinding your foods to the right consistency. These liquids include gravies, sauces, vegetable or fruit juice, milk, half and half, or water.  Strain extra liquid from foods before eating.  Reheat foods slowly to prevent a tough crust from forming.  Prepare foods in advance. Meal planning  Eat a variety of foods to get all the nutrients you need.  Some foods may be tolerated better than others. Work with your dietitian to identify which foods are safest for you to eat.  Follow your meal plan as told by your dietitian. What foods are allowed? Grains Moist breads without nuts or seeds. Biscuits, muffins, pancakes, and waffles that are well-moistened with syrup, jelly, margarine, or butter. Cooked cereals. Moist bread stuffing. Moist rice. Well-moistened cold cereal with small chunks. Well-cooked pasta, noodles, rice, and bread dressing in small pieces and thick sauce. Soft dumplings or spaetzle in small pieces and butter or gravy. Vegetables Soft, well-cooked vegetables in small pieces. Soft-cooked, mashed potatoes. Thickened vegetable juice. Fruits Canned or cooked fruits that are soft or moist and do not have skin or seeds. Fresh, soft bananas. Thickened fruit juices. Meat and other protein foods Tender, moist meats or poultry in small pieces. Moist meatballs or meatloaf. Fish without bones. Eggs or egg substitutes in  small pieces. Tofu. Tempeh and meat alternatives in small pieces. Well-cooked, tender beans, peas, baked beans, and other legumes. Dairy Thickened milk. Cream cheese. Yogurt. Cottage cheese. Sour cream. Small pieces of soft cheese. Fats and oils Butter. Oils. Margarine. Mayonnaise. Gravy. Spreads. Sweets and desserts Soft, smooth, moist desserts. Pudding. Custard. Moist cakes. Jam. Jelly. Honey. Preserves.  Ask your health care provider whether you can have frozen desserts. Seasoning and other foods All seasonings and sweeteners. All sauces with small chunks. Prepared tuna, egg, or chicken salad without raw fruits or vegetables. Moist casseroles with small, tender pieces of meat. Soups with tender meat. What foods are not allowed? Grains Coarse or dry cereals. Dry breads. Toast. Crackers. Tough, crusty breads, such as [place] bread and baguettes. Dry pancakes, waffles, and muffins. Sticky rice. Dry bread stuffing. Granola. Popcorn. Chips. Vegetables All raw vegetables. Cooked corn. Rubbery or stiff cooked vegetables. Stringy vegetables, such as celery. Tough, crisp fried potatoes. Potato skins. Fruits Hard, crunchy, stringy, high-pulp, and juicy raw fruits such as apples, pineapple, papaya, and watermelon. Small, round fruits, such as grapes. Dried fruit and fruit leather. Meat and other protein foods Large pieces of meat. Dry, tough meats, such as bacon, sausage, and hot dogs. Chicken, [place]key, or fish with skin and bones. Crunchy peanut butter. Nuts. Seeds. Nut and seed butters. Dairy Yogurt with nuts, seeds, or large chunks. Large chunks of cheese. Frozen desserts and milk consistency not allowed by your dietitian. Sweets and desserts Dry cakes. Chewy or dry cookies. Any desserts with nuts, seeds, dry fruits, coconut, pineapple, or anything dry, sticky, or hard. Chewy caramel. Licorice. Taffy-type candies. Ask your health care provider whether you can have frozen desserts. Seasoning and other foods Soups with tough or large chunks of meats, poultry, or vegetables. Corn or clam chowder. Smoothies with large chunks of fruit. Summary  Bite size foods can be helpful for people with moderate swallowing problems.  On the dysphagia eating plan, you may eat foods that are soft, moist, and cut into pieces smaller than 15mm by 15mm.  You may be instructed to thicken liquids. Follow your health care  provider's instructions about how to do this and to what consistency. This information is not intended to replace advice given to you by your health care provider. Make sure you discuss any questions you have with your health care provider. Document Released: 05/10/2005 Document Revised: 08/31/2018 Document Reviewed: 08/20/2016 Elsevier Patient Education  2020 [employer]

## 2019-01-31 NOTE — Progress Notes (Signed)
Primary Care Physician: Gareth Morgan, MD  Primary Gastroenterologist:  Roetta Sessions, MD   Chief Complaint  Patient presents with  . Dysphagia    HPI: [name] is a 85 y.o. female here for follow up. She was seen back in July with complaints of throat tightness and vague dysphagia.  Difficult historian.  EGD December 2017 with moderate Schatzki ring status post dilation, medium sized hiatal hernia.  EGD in 2015 with critical Schatzki ring status post dilation.  At time of last office visit she complained of an infection in the back of her throat, some congestion and tightness.  Had seen her PCP and treated for "infection in her throat".  Reported tightness in her throat with swallowing food.  Have been present for about a month.  Drinking water seem to help with the dryness and tightness.  At the time really denied any dysphagia to solid foods.  Has crushed her pills in applesauce for a long time.  She presents back for 89-month follow-up.  She's accompanied by her daughter again.  Again there is appears to be some memory issues with patient as far as recollection of ongoing symptoms.  She does states she has been back to Dr. [name] and treated for a second infection of her throat.  Has finished those antibiotics.  At one point she denies any dysphagia to solid foods but contradicts herself further in the interview.  She had an episode of getting choked on hot dogs 2 days ago.  Hot dog got stuck and she coughed until she had relief.  She denies heartburn.  No problems swallowing liquids.  States she ate just fine this morning.  Daughter reports that she refused to eat turnip greens because she is afraid she could not swallow them yesterday.  She continues to feel like there is some tightness on the left side of her neck.  Denies shortness of breath.  Denies postnasal drip.  No abdominal pain.  Bowel movements are regular.  No blood in the stool or melena.  Current Outpatient  Medications  Medication Sig Dispense Refill  . acetaminophen (TYLENOL) 650 MG CR tablet Take 650 mg by mouth as needed for pain.    Marland Kitchen albuterol (PROVENTIL HFA;VENTOLIN HFA) 108 (90 Base) MCG/ACT inhaler Inhale 1-2 puffs into the lungs every 4 (four) hours as needed for wheezing or shortness of breath.    . ALPRAZolam (XANAX) 0.25 MG tablet Take 0.25 mg by mouth at bedtime as needed for anxiety.    Marland Kitchen amiodarone (PACERONE) 200 MG tablet Take 0.5 tablets (100 mg total) by mouth daily. 45 tablet 1  . antiseptic oral rinse (BIOTENE) LIQD 15 mLs by Mouth Rinse route as needed for dry mouth.    Marland Kitchen atorvastatin (LIPITOR) 40 MG tablet Take 40 mg by mouth daily at 6 PM.    . bisacodyl (DULCOLAX) 5 MG EC tablet Take 5 mg by mouth daily as needed for moderate constipation.    . bismuth subsalicylate (PEPTO BISMOL) 262 MG/15ML suspension Take 30 mLs by mouth daily as needed for indigestion.    . cetirizine (ZYRTEC) 10 MG tablet Take 5-10 mg by mouth daily as needed for allergies or rhinitis.     . cloNIDine (CATAPRES) 0.1 MG tablet TAKE 1 TABLET BY MOUTH UP TO THREE TIMES DAILY IF BLOOD PRESSURE IS GREATER THAN 140/90    . COD LIVER OIL PO Take 15 mLs by mouth daily.    Marland Kitchen EASY COMFORT LANCETS MISC  TO test blood glucose ONCE daily    . ELIQUIS 2.5 MG TABS tablet TAKE 1 TABLET BY MOUTH TWICE DAILY 180 tablet 1  . furosemide (LASIX) 20 MG tablet TAKE 1 TABLET BY MOUTH EVERY DAY AS NEEDED FOR FLUID 30 tablet 6  . guaiFENesin-dextromethorphan (ROBITUSSIN DM) 100-10 MG/5ML syrup Take 5 mLs by mouth every 4 (four) hours as needed for cough (SUGAR FREE).    Elmore Guise Devices (ADJUSTABLE LANCING DEVICE) MISC USE TO check blood glucose    . levothyroxine (SYNTHROID, LEVOTHROID) 25 MCG tablet Take 25 mcg by mouth daily before breakfast.    . lisinopril (PRINIVIL,ZESTRIL) 20 MG tablet TAKE ONE TABLET BY MOUTH TWICE DAILY 180 tablet 3  . metoprolol succinate (TOPROL-XL) 50 MG 24 hr tablet TAKE ONE TABLET BY MOUTH EVERY  MORNING immediately following a meal. 90 tablet 3  . omeprazole (PRILOSEC) 20 MG capsule TAKE 1 CAPSULE BY MOUTH EVERY DAY 30 capsule 5  . polyethylene glycol (MIRALAX / GLYCOLAX) packet Take 17 g 2 (two) times a week by mouth.    . potassium chloride (MICRO-K) 10 MEQ CR capsule TAKE TWO CAPSULES BY MOUTH DAILY FOR low potassium  5  . spironolactone (ALDACTONE) 25 MG tablet TAKE 1 TABLET BY MOUTH EVERY MORNING 90 tablet 1  . umeclidinium bromide (INCRUSE ELLIPTA) 62.5 MCG/INH AEPB Inhale 1 puff into the lungs daily.     No current facility-administered medications for this visit.     Allergies as of 01/31/2019 - Review Complete 01/31/2019  Allergen Reaction Noted  . Chocolate  03/10/2014    ROS:  General: Negative for anorexia, weight loss, fever, chills, fatigue, weakness. ENT: Negative for hoarseness,  nasal congestion.  See HPI CV: Negative for chest pain, angina, palpitations, dyspnea on exertion, peripheral edema.  Respiratory: Negative for dyspnea at rest, dyspnea on exertion, cough, sputum, wheezing.  GI: See history of present illness. GU:  Negative for dysuria, hematuria, urinary incontinence, urinary frequency, nocturnal urination.  Endo: Negative for unusual weight change.    Physical Examination:   BP (!) 199/78   Pulse 68   Temp (!) 95.9 F (35.5 C) (Temporal)   Ht 5\' 6"  (1.676 m)   Wt 132 lb 6.4 oz (60.1 kg)   BMI 21.37 kg/m   General: Well-nourished, well-developed in no acute distress.  Accompanied by daughter Eyes: No icterus. Mouth: Oropharyngeal mucosa moist and pink , no lesions erythema or exudate.  No neck lymphadenopathy Lungs: Clear to auscultation bilaterally.  Heart: Regular rate and rhythm, no murmurs rubs or gallops.  Abdomen: Bowel sounds are normal, nontender, nondistended, no hepatosplenomegaly or masses, no abdominal bruits or hernia , no rebound or guarding.   Extremities: No lower extremity edema. No clubbing or deformities. Neuro: Alert  and oriented x 4   Skin: Warm and dry, no jaundice.   Psych: Alert and cooperative, normal mood and affect.  Labs:  Lab Results  Component Value Date   CREATININE 1.11 (H) 07/17/2018   BUN 32 (H) 07/17/2018   NA 136 07/17/2018   K 4.2 07/17/2018   CL 105 07/17/2018   CO2 23 07/17/2018   Lab Results  Component Value Date   WBC 4.1 07/17/2018   HGB 11.3 (L) 07/17/2018   HCT 36.4 07/17/2018   MCV 98.6 07/17/2018   PLT 194 07/17/2018   Lab Results  Component Value Date   ALT 17 07/17/2018   AST 21 07/17/2018   ALKPHOS 61 07/17/2018   BILITOT 1.0 07/17/2018   Lab  Results  Component Value Date   FERRITIN 42 07/17/2018     Imaging Studies: No results found.

## 2019-01-31 NOTE — Assessment & Plan Note (Addendum)
Pleasant 85 year old female but difficult historian.  Presents with daughter again today for complaints of dysphagia as well as throat dryness.  She reports having a couple of rounds of antibiotics for "congestion and throat infection".  Episode of "choking" on a hot dog 2 days ago.  Given prior history of Schatzki ring requiring dilatation in 2015/2017 we discussed possibility of EGD with esophageal dilation versus barium pill esophagram.  Patient declined both.  She does not like swallowing barium tablet because it did become lodged last time at the GE junction.  Offered her barium study initially because she is a difficult historian and is not clear whether her symptoms are truly dysphagia.  At this point she does not want any further work-up.  She plans to monitor her symptoms at home and let us know if she changes her mind.  Discussed swallowing precautions.  Handout provided for bite size dysphagia plan.  She will continue omeprazole as before.  Return to the office as needed.  Blood pressure elevated in the office.  She took her clonidine while present.  Denies headache, dizziness, lightheadedness.  Family to monitor blood pressure and contact PCP if remains elevated.

## 2019-02-14 ENCOUNTER — Ambulatory Visit: Payer: Medicare Other | Admitting: Gastroenterology

## 2019-02-26 ENCOUNTER — Other Ambulatory Visit: Payer: Self-pay | Admitting: Cardiology

## 2019-03-07 ENCOUNTER — Telehealth: Payer: Self-pay

## 2019-03-07 NOTE — Telephone Encounter (Signed)
Pt is calling after eating some fish and states it feels like it gets caught in her through. Pt was advised to go to the ED if she feels her airway gets blocked or if she has difficulty breathing. Pt is going to drink some more fluids and if she doesn't feel any better, she is going to go to the ED.    1. Pt was seen 02/13/2019 and was asked to call if she decides to pursue xray of her esophagus or upper endoscopy to evaluate your swallowing concerns. Pt does want to proceed at this time.

## 2019-03-07 NOTE — Telephone Encounter (Signed)
Agree with advice given. Check on patient in am. If still having sensation of food stuck, please address with available provider  Otherwise Offer bpe for dysphagia for chronic issues.

## 2019-03-08 ENCOUNTER — Other Ambulatory Visit: Payer: Self-pay | Admitting: Cardiology

## 2019-03-08 NOTE — Telephone Encounter (Signed)
Spoke with pt. She is feeling better this morning and called her PCP yesterday. Pt has an appointment with her PCP and thank our office for calling her back. Pt will call back when she wants to schedule her BPE.

## 2019-03-14 ENCOUNTER — Telehealth: Payer: Self-pay | Admitting: *Deleted

## 2019-03-14 DIAGNOSIS — R1310 Dysphagia, unspecified: Secondary | ICD-10-CM

## 2019-03-14 NOTE — Telephone Encounter (Signed)
Ok to schedule BPE for dysphagia 

## 2019-03-14 NOTE — Telephone Encounter (Signed)
Pt called in and wants to pursue scheduling an xray.  [phone]

## 2019-03-15 NOTE — Telephone Encounter (Signed)
BPE scheduled for 03/19/2019 for 11:00am, arrival time 10:45am, npo 3 hours prior.  Called pt and is aware of appt details. She wrote appt details down.

## 2019-03-15 NOTE — Addendum Note (Signed)
Addended by: Cheron Every on: 03/15/2019 08:32 AM   Modules accepted: Orders

## 2019-03-19 ENCOUNTER — Ambulatory Visit (HOSPITAL_COMMUNITY): Payer: Medicare Other

## 2019-03-23 ENCOUNTER — Other Ambulatory Visit (HOSPITAL_COMMUNITY): Payer: Medicare Other

## 2019-03-25 ENCOUNTER — Observation Stay (HOSPITAL_COMMUNITY)
Admission: EM | Admit: 2019-03-25 | Discharge: 2019-03-26 | Disposition: A | Discharge status: Home / Self Care | Payer: Medicare Other | Attending: Internal Medicine | Admitting: Internal Medicine

## 2019-03-25 ENCOUNTER — Other Ambulatory Visit: Payer: Self-pay

## 2019-03-25 ENCOUNTER — Encounter (HOSPITAL_COMMUNITY): Payer: Self-pay | Admitting: Emergency Medicine

## 2019-03-25 ENCOUNTER — Emergency Department (HOSPITAL_COMMUNITY): Payer: Medicare Other

## 2019-03-25 DIAGNOSIS — K219 Gastro-esophageal reflux disease without esophagitis: Secondary | ICD-10-CM | POA: Insufficient documentation

## 2019-03-25 DIAGNOSIS — I4811 Longstanding persistent atrial fibrillation: Secondary | ICD-10-CM | POA: Insufficient documentation

## 2019-03-25 DIAGNOSIS — Z7901 Long term (current) use of anticoagulants: Secondary | ICD-10-CM | POA: Insufficient documentation

## 2019-03-25 DIAGNOSIS — I161 Hypertensive emergency: Principal | ICD-10-CM | POA: Insufficient documentation

## 2019-03-25 DIAGNOSIS — Z79899 Other long term (current) drug therapy: Secondary | ICD-10-CM | POA: Diagnosis not present

## 2019-03-25 DIAGNOSIS — R079 Chest pain, unspecified: Secondary | ICD-10-CM

## 2019-03-25 DIAGNOSIS — E43 Unspecified severe protein-calorie malnutrition: Secondary | ICD-10-CM | POA: Diagnosis not present

## 2019-03-25 DIAGNOSIS — E119 Type 2 diabetes mellitus without complications: Secondary | ICD-10-CM | POA: Diagnosis not present

## 2019-03-25 DIAGNOSIS — I5032 Chronic diastolic (congestive) heart failure: Secondary | ICD-10-CM | POA: Diagnosis not present

## 2019-03-25 DIAGNOSIS — F419 Anxiety disorder, unspecified: Secondary | ICD-10-CM | POA: Diagnosis not present

## 2019-03-25 DIAGNOSIS — J449 Chronic obstructive pulmonary disease, unspecified: Secondary | ICD-10-CM | POA: Diagnosis not present

## 2019-03-25 DIAGNOSIS — R1310 Dysphagia, unspecified: Secondary | ICD-10-CM | POA: Diagnosis not present

## 2019-03-25 DIAGNOSIS — I10 Essential (primary) hypertension: Secondary | ICD-10-CM

## 2019-03-25 DIAGNOSIS — I480 Paroxysmal atrial fibrillation: Secondary | ICD-10-CM | POA: Diagnosis not present

## 2019-03-25 DIAGNOSIS — E039 Hypothyroidism, unspecified: Secondary | ICD-10-CM | POA: Diagnosis not present

## 2019-03-25 DIAGNOSIS — E782 Mixed hyperlipidemia: Secondary | ICD-10-CM | POA: Diagnosis not present

## 2019-03-25 DIAGNOSIS — R778 Other specified abnormalities of plasma proteins: Secondary | ICD-10-CM

## 2019-03-25 DIAGNOSIS — I110 Hypertensive heart disease with heart failure: Secondary | ICD-10-CM | POA: Insufficient documentation

## 2019-03-25 DIAGNOSIS — Z20828 Contact with and (suspected) exposure to other viral communicable diseases: Secondary | ICD-10-CM | POA: Insufficient documentation

## 2019-03-25 DIAGNOSIS — Z79890 Hormone replacement therapy (postmenopausal): Secondary | ICD-10-CM | POA: Insufficient documentation

## 2019-03-25 DIAGNOSIS — K5909 Other constipation: Secondary | ICD-10-CM | POA: Diagnosis not present

## 2019-03-25 LAB — CBC WITH DIFFERENTIAL/PLATELET
Abs Immature Granulocytes: 0.01 10*3/uL (ref 0.00–0.07)
Basophils Absolute: 0 10*3/uL (ref 0.0–0.1)
Basophils Relative: 1 %
Eosinophils Absolute: 0 10*3/uL (ref 0.0–0.5)
Eosinophils Relative: 1 %
HCT: 38.7 % (ref 36.0–46.0)
Hemoglobin: 12.3 g/dL (ref 12.0–15.0)
Immature Granulocytes: 0 %
Lymphocytes Relative: 18 %
Lymphs Abs: 0.7 10*3/uL (ref 0.7–4.0)
MCH: 29.4 pg (ref 26.0–34.0)
MCHC: 31.8 g/dL (ref 30.0–36.0)
MCV: 92.6 fL (ref 80.0–100.0)
Monocytes Absolute: 0.2 10*3/uL (ref 0.1–1.0)
Monocytes Relative: 6 %
Neutro Abs: 3.1 10*3/uL (ref 1.7–7.7)
Neutrophils Relative %: 74 %
Platelets: 201 10*3/uL (ref 150–400)
RBC: 4.18 MIL/uL (ref 3.87–5.11)
RDW: 13.7 % (ref 11.5–15.5)
WBC: 4.1 10*3/uL (ref 4.0–10.5)
nRBC: 0 % (ref 0.0–0.2)

## 2019-03-25 LAB — URINALYSIS, ROUTINE W REFLEX MICROSCOPIC
Bilirubin Urine: NEGATIVE
Glucose, UA: NEGATIVE mg/dL
Hgb urine dipstick: NEGATIVE
Ketones, ur: 5 mg/dL — AB
Leukocytes,Ua: NEGATIVE
Nitrite: NEGATIVE
Protein, ur: NEGATIVE mg/dL
Specific Gravity, Urine: 1.004 — ABNORMAL LOW (ref 1.005–1.030)
pH: 7 (ref 5.0–8.0)

## 2019-03-25 LAB — BASIC METABOLIC PANEL
Anion gap: 11 (ref 5–15)
BUN: 8 mg/dL (ref 8–23)
CO2: 22 mmol/L (ref 22–32)
Calcium: 9.4 mg/dL (ref 8.9–10.3)
Chloride: 100 mmol/L (ref 98–111)
Creatinine, Ser: 0.87 mg/dL (ref 0.44–1.00)
GFR calc Af Amer: >60 mL/min (ref >60)
GFR calc non Af Amer: 59 mL/min — ABNORMAL LOW (ref >60)
Glucose, Bld: 110 mg/dL — ABNORMAL HIGH (ref 70–99)
Potassium: 4 mmol/L (ref 3.5–5.1)
Sodium: 133 mmol/L — ABNORMAL LOW (ref 135–145)

## 2019-03-25 LAB — TROPONIN I (HIGH SENSITIVITY)
Troponin I (High Sensitivity): 41 ng/L — ABNORMAL HIGH (ref <18)
Troponin I (High Sensitivity): 182 ng/L (ref <18)
Troponin I (High Sensitivity): 284 ng/L (ref <18)
Troponin I (High Sensitivity): 270 ng/L (ref <18)

## 2019-03-25 LAB — BRAIN NATRIURETIC PEPTIDE: B Natriuretic Peptide: 221 pg/mL — ABNORMAL HIGH (ref 0.0–100.0)

## 2019-03-25 MED ORDER — POLYETHYLENE GLYCOL 3350 17 G PO PACK: 17.0000 g | PACK | Freq: Every day | ORAL | Status: DC | PRN

## 2019-03-25 MED ORDER — ACETAMINOPHEN 650 MG RE SUPP: 650.0000 mg | Freq: Four times a day (QID) | RECTAL | Status: DC | PRN

## 2019-03-25 MED ORDER — CHLORHEXIDINE GLUCONATE CLOTH 2 % EX PADS
6.0000 | MEDICATED_PAD | Freq: Every day | CUTANEOUS | Status: DC
  Administered 2019-03-26: 10:00:00 6 via TOPICAL

## 2019-03-25 MED ORDER — CLONIDINE HCL 0.2 MG PO TABS
0.2000 mg | ORAL_TABLET | Freq: Two times a day (BID) | ORAL | Status: DC
  Administered 2019-03-25 – 2019-03-26 (×2): 0.2 mg via ORAL
  Filled 2019-03-25 (×2): qty 1

## 2019-03-25 MED ORDER — PANTOPRAZOLE SODIUM 40 MG PO TBEC: 40.0000 mg | DELAYED_RELEASE_TABLET | Freq: Every day | ORAL | Status: DC

## 2019-03-25 MED ORDER — LABETALOL HCL 5 MG/ML IV SOLN
10.0000 mg | Freq: Once | INTRAVENOUS | Status: AC
  Administered 2019-03-25: 19:00:00 10 mg via INTRAVENOUS
  Filled 2019-03-25: qty 4

## 2019-03-25 MED ORDER — ONDANSETRON HCL 4 MG/2ML IJ SOLN: 4.0000 mg | Freq: Four times a day (QID) | INTRAMUSCULAR | Status: DC | PRN

## 2019-03-25 MED ORDER — LISINOPRIL 10 MG PO TABS: 20.0000 mg | ORAL_TABLET | Freq: Two times a day (BID) | ORAL | Status: DC

## 2019-03-25 MED ORDER — METOPROLOL SUCCINATE ER 25 MG PO TB24: 50.0000 mg | ORAL_TABLET | Freq: Every day | ORAL | Status: DC

## 2019-03-25 MED ORDER — LISINOPRIL 10 MG PO TABS
20.0000 mg | ORAL_TABLET | Freq: Two times a day (BID) | ORAL | Status: DC
  Administered 2019-03-25 – 2019-03-26 (×2): 20 mg via ORAL
  Filled 2019-03-25 (×2): qty 2

## 2019-03-25 MED ORDER — SPIRONOLACTONE 25 MG PO TABS
25.0000 mg | ORAL_TABLET | Freq: Every morning | ORAL | Status: DC
  Administered 2019-03-26: 10:00:00 25 mg via ORAL
  Filled 2019-03-25: qty 1

## 2019-03-25 MED ORDER — ACETAMINOPHEN 325 MG PO TABS: 650.0000 mg | ORAL_TABLET | Freq: Four times a day (QID) | ORAL | Status: DC | PRN

## 2019-03-25 MED ORDER — AMIODARONE HCL 200 MG PO TABS
100.0000 mg | ORAL_TABLET | Freq: Every day | ORAL | Status: DC
  Administered 2019-03-26: 10:00:00 100 mg via ORAL
  Filled 2019-03-25: qty 1

## 2019-03-25 MED ORDER — ALPRAZOLAM 0.25 MG PO TABS
0.2500 mg | ORAL_TABLET | Freq: Every day | ORAL | Status: DC
  Administered 2019-03-25: 23:00:00 0.25 mg via ORAL
  Filled 2019-03-25: qty 1

## 2019-03-25 MED ORDER — METOPROLOL TARTRATE 5 MG/5ML IV SOLN: 5.0000 mg | Freq: Once | INTRAVENOUS | Status: DC

## 2019-03-25 MED ORDER — CARVEDILOL 12.5 MG PO TABS: 12.5000 mg | ORAL_TABLET | Freq: Two times a day (BID) | ORAL | Status: DC

## 2019-03-25 MED ORDER — ENSURE ENLIVE PO LIQD
237.0000 mL | Freq: Two times a day (BID) | ORAL | Status: DC
  Administered 2019-03-26: 16:00:00 237 mL via ORAL

## 2019-03-25 MED ORDER — HYDRALAZINE HCL 25 MG PO TABS: 25.0000 mg | ORAL_TABLET | Freq: Once | ORAL | Status: DC

## 2019-03-25 MED ORDER — LABETALOL HCL 5 MG/ML IV SOLN: 5.0000 mg | INTRAVENOUS | Status: DC | PRN

## 2019-03-25 MED ORDER — CLONIDINE HCL 0.1 MG PO TABS
0.1000 mg | ORAL_TABLET | Freq: Once | ORAL | Status: AC
  Administered 2019-03-25: 16:00:00 0.1 mg via ORAL
  Filled 2019-03-25: qty 1

## 2019-03-25 MED ORDER — ATORVASTATIN CALCIUM 40 MG PO TABS
40.0000 mg | ORAL_TABLET | Freq: Every day | ORAL | Status: DC
  Administered 2019-03-26: 18:00:00 40 mg via ORAL
  Filled 2019-03-25: qty 1

## 2019-03-25 MED ORDER — LEVOTHYROXINE SODIUM 25 MCG PO TABS
25.0000 ug | ORAL_TABLET | Freq: Every day | ORAL | Status: DC
  Filled 2019-03-25: qty 1

## 2019-03-25 MED ORDER — METOPROLOL TARTRATE 50 MG PO TABS
50.0000 mg | ORAL_TABLET | Freq: Two times a day (BID) | ORAL | Status: DC
  Administered 2019-03-25 – 2019-03-26 (×2): 50 mg via ORAL
  Filled 2019-03-25 (×2): qty 1

## 2019-03-25 MED ORDER — ALBUTEROL SULFATE HFA 108 (90 BASE) MCG/ACT IN AERS
1.0000 | INHALATION_SPRAY | RESPIRATORY_TRACT | Status: DC | PRN
  Filled 2019-03-25: qty 6.7

## 2019-03-25 MED ORDER — UMECLIDINIUM BROMIDE 62.5 MCG/INH IN AEPB
1.0000 | INHALATION_SPRAY | Freq: Every day | RESPIRATORY_TRACT | Status: DC
  Filled 2019-03-25: qty 7

## 2019-03-25 MED ORDER — HYDRALAZINE HCL 25 MG PO TABS: 25.0000 mg | ORAL_TABLET | ORAL | Status: DC

## 2019-03-25 MED ORDER — ONDANSETRON HCL 4 MG PO TABS: 4.0000 mg | ORAL_TABLET | Freq: Four times a day (QID) | ORAL | Status: DC | PRN

## 2019-03-25 NOTE — H&P (Addendum)
History and Physical    [name]:[id number] DOB: 1927/02/22 DOA: 03/25/2019  PCP: Gareth Morgan, MD   Patient coming from: Home  I have personally briefly reviewed patient's old medical records in [facility]  Chief Complaint: Difficulty swallowing, shortness of breath.  HPI: [name] is a 85 y.o. female with medical history significant for hypertension, mitral regurgitation, LBBB, atrial fibrillation with RVR, diastolic CHF, COPD, diabetes mellitus.  Patient was brought to the ED with multiple complaints. Daughter is present at bedside and helps with some of the history, patient is hard of hearing but alert and oriented.  Over the past several months patient has had intermittent difficulty breathing, not present every day, presents in the mornings when she wakes up from bed or when about to eat.  But then resolves.  She denies difficulty breathing with activity.  They report intermittent nasal congestion's, occasional sore throat.  Reports symptoms improved after some courses of antibiotics prescribed as outpatient.  Daughter also reports some anxiety component that has improved with recent prescription of nightly Xanax.  No fevers, no wheezing, no known contact with Covid positive patients.  At the time of my evaluation patient and daughter deny any difficulty breathing. Patient has had difficulty swallowing that has progressed.  History of esophageal dilatations.  She reports vomiting when she tried to eat ravioli yesterday.  Almost choking on hot dog about 5 weeks ago.  Patient's was supposed to have an outpatient barium pill esophagram done, initially patient declined.  Later she agreed to evaluation but this had to be postponed twice due to elevated blood pressures. No chest pain.  No lower extremity swelling, redness or pain.  ED Course: O2 sats greater than 99% on room air, heart rates 70s to 80s.  While in the waiting room, patient reported difficulty breathing,  EKG was performed which showed heart rates in the 130s, ?  Atrial fibrillation by EDP, patient spontaneously converted back to sinus rhythm.  Troponin 41 >> 182.  Portable chest x-ray negative for acute abnormality, BNP mildly elevated 221. EDP talked to cardiologist on-call Dr. [name], hospitalization was recommended, cardiology consult.  Review of Systems: As per HPI all other systems reviewed and negative.  Past Medical History:  Diagnosis Date  . Anxiety   . Atrial fibrillation (HCC)   . COPD (chronic obstructive pulmonary disease) (HCC)   . Depression   . Hyperlipidemia   . Hypertension   . Hypothyroidism   . Mitral regurgitation    Mild  . Palpitations    PAT/EAT/NSVT, normal LVEF  . Type 2 diabetes mellitus (HCC)     Past Surgical History:  Procedure Laterality Date  . 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 hiatal hernia. Antral erosions status post gastric biopsy.  . ESOPHAGOGASTRODUODENOSCOPY N/A 05/12/2016   Dr. Jena Gauss: moderate Schatzki ring s/p dilation. moderate sized hiatal hernia  . MALONEY DILATION N/A 04/17/2014   Procedure: [name] DILATION;  Surgeon: Corbin Ade, MD;  Location: [facility];  Service: Endoscopy;  Laterality: N/A;  [name] DILATION N/A 05/12/2016   Procedure: [name] DILATION;  Surgeon: Corbin Ade, MD;  Location: [facility]ITE;  Service: Endoscopy;  Laterality: N/A;  . none       reports that she has never smoked. She has never used smokeless tobacco. She reports that she does not drink alcohol or use drugs.  Allergies  Allergen Reactions  .  Chocolate     Hives    Family History  Problem Relation Age of Onset  . Heart failure Mother        Died in her 44s  . Tuberculosis Father        Died at young age  . Cancer Brother   . Arthritis Other   . Colon cancer Other     Prior to Admission medications   Medication  Sig Start Date End Date Taking? Authorizing Provider  acetaminophen (TYLENOL) 650 MG CR tablet Take 650 mg by mouth as needed for pain.    [provider]  albuterol (PROVENTIL HFA;VENTOLIN HFA) 108 (90 Base) MCG/ACT inhaler Inhale 1-2 puffs into the lungs every 4 (four) hours as needed for wheezing or shortness of breath.    [provider]  ALPRAZolam Prudy Feeler) 0.25 MG tablet Take 0.25 mg by mouth at bedtime as needed for anxiety.    [provider]  amiodarone (PACERONE) 200 MG tablet TAKE 1/2 TABLET BY MOUTH EVERY DAY 02/26/19   Jonelle Sidle, MD  antiseptic oral rinse (BIOTENE) LIQD 15 mLs by Mouth Rinse route as needed for dry mouth.    [provider]  atorvastatin (LIPITOR) 40 MG tablet Take 40 mg by mouth daily at 6 PM. 09/27/11   Jonelle Sidle, MD  bisacodyl (DULCOLAX) 5 MG EC tablet Take 5 mg by mouth daily as needed for moderate constipation.    [provider]  bismuth subsalicylate (PEPTO BISMOL) 262 MG/15ML suspension Take 30 mLs by mouth daily as needed for indigestion.    [provider]  cetirizine (ZYRTEC) 10 MG tablet Take 5-10 mg by mouth daily as needed for allergies or rhinitis.     [provider]  cloNIDine (CATAPRES) 0.1 MG tablet TAKE 1 TABLET BY MOUTH UP TO THREE TIMES DAILY IF BLOOD PRESSURE IS GREATER THAN 140/90 07/11/18   [provider]  COD LIVER OIL PO Take 15 mLs by mouth daily.    [provider]  EASY COMFORT LANCETS MISC TO test blood glucose ONCE daily 07/11/18   [provider]  ELIQUIS 2.5 MG TABS tablet TAKE 1 TABLET BY MOUTH TWICE DAILY 03/08/19   Jonelle Sidle, MD  furosemide (LASIX) 20 MG tablet TAKE 1 TABLET BY MOUTH EVERY DAY AS NEEDED FOR FLUID 06/23/16   Jonelle Sidle, MD  guaiFENesin-dextromethorphan (ROBITUSSIN DM) 100-10 MG/5ML syrup Take 5 mLs by mouth every 4 (four) hours as needed for cough (SUGAR FREE).    [provider]  Lancet  Devices (ADJUSTABLE LANCING DEVICE) MISC USE TO check blood glucose 04/24/18   [provider]  levothyroxine (SYNTHROID, LEVOTHROID) 25 MCG tablet Take 25 mcg by mouth daily before breakfast.    [provider]  lisinopril (PRINIVIL,ZESTRIL) 20 MG tablet TAKE ONE TABLET BY MOUTH TWICE DAILY 05/12/17   Jonelle Sidle, MD  metoprolol succinate (TOPROL-XL) 50 MG 24 hr tablet TAKE ONE TABLET BY MOUTH EVERY MORNING immediately following a meal. 05/03/18   Jonelle Sidle, MD  omeprazole (PRILOSEC) 20 MG capsule TAKE 1 CAPSULE BY MOUTH EVERY DAY 09/29/15   Tiffany Kocher, PA-C  polyethylene glycol (MIRALAX / GLYCOLAX) packet Take 17 g 2 (two) times a week by mouth.    [provider]  potassium chloride (MICRO-K) 10 MEQ CR capsule TAKE TWO CAPSULES BY MOUTH DAILY FOR low potassium 04/22/17   [provider]  spironolactone (ALDACTONE) 25 MG tablet TAKE 1 TABLET BY MOUTH EVERY  MORNING 10/02/18   Jonelle SidleMcDowell, Samuel G, MD  umeclidinium bromide (INCRUSE ELLIPTA) 62.5 MCG/INH AEPB Inhale 1 puff into the lungs daily.    [provider]    Physical Exam: Vitals:   03/25/19 1615 03/25/19 1630 03/25/19 1645 03/25/19 1700  BP: (!) 183/89 (!) 190/70 (!) 180/85 (!) 185/70  Pulse: 91 70 78 72  Resp: 20 13 18 16   Temp:      TempSrc:      SpO2: 99% 100% 100% 100%  Weight:      Height:        Constitutional: NAD, calm, comfortable Vitals:   03/25/19 1615 03/25/19 1630 03/25/19 1645 03/25/19 1700  BP: (!) 183/89 (!) 190/70 (!) 180/85 (!) 185/70  Pulse: 91 70 78 72  Resp: 20 13 18 16   Temp:      TempSrc:      SpO2: 99% 100% 100% 100%  Weight:      Height:       Eyes: PERRL, lids and conjunctivae normal ENMT: Mucous membranes are moist. Posterior pharynx clear of any exudate or lesions. Neck: normal, supple, no masses, no thyromegaly Respiratory: clear to auscultation bilaterally, no wheezing, no crackles. Normal respiratory effort. No accessory muscle  use.  Cardiovascular: Regular rate and rhythm, no murmurs / rubs / gallops.  Trace bilateral extremity edema. 2+ pedal pulses.   Abdomen: no tenderness, no masses palpated. No hepatosplenomegaly. Bowel sounds positive.  Musculoskeletal: no clubbing / cyanosis. No joint deformity upper and lower extremities. Good ROM, no contractures. Normal muscle tone.  Skin: no rashes, lesions, ulcers. No induration Neurologic: CN 2-12 grossly intact. Strength 5/5 in all 4.  Psychiatric: Normal judgment and insight. Alert and oriented x 3. Normal mood.   Labs on Admission: I have personally reviewed following labs and imaging studies  CBC: Recent Labs  Lab 03/25/19 1323  WBC 4.1  NEUTROABS 3.1  HGB 12.3  HCT 38.7  MCV 92.6  PLT 201   Basic Metabolic Panel: Recent Labs  Lab 03/25/19 1323  NA 133*  K 4.0  CL 100  CO2 22  GLUCOSE 110*  BUN 8  CREATININE 0.87  CALCIUM 9.4    Radiological Exams on Admission: Dg Chest Port 1 View  Result Date: 03/25/2019 CLINICAL DATA:  Trouble swallowing. Pt also reports intermittent shortness of breath and congestion, chest pain, hypertension EXAM: PORTABLE CHEST 1 VIEW COMPARISON:  03/05/2018 FINDINGS: Cardiac silhouette borderline enlarged. No mediastinal or hilar masses. No evidence of adenopathy. Lungs are clear.  No pleural effusion or pneumothorax. Skeletal structures are grossly intact. IMPRESSION: No acute cardiopulmonary disease. Electronically Signed   By: Amie Portlandavid  Ormond M.D.   On: 03/25/2019 14:42    EKG: Independently reviewed.  Initial EKG sinus tachycardia versus atrial fibrillation.  Subsequent EKG shows sinus rhythm.  Old LBBB.  QTc 473.  Assessment/Plan Active Problems:   Dysphagia  Dysphagia- chronic.  History of esophageal dilatations 2017 for schatzki ring.  Reports of vomiting, almost choking, but okay with liquid and soft food. Per notes, considerations were for out-patient esophagram Vs esophageal dilatation. -GI consult -Swallow  evaluation - Full Liquid diet for now -N.p.o. midnight  Uncontrolled hypertension-systolic 180s to 161W200s.  Reports compliance with metoprolol, lisinopril, over the past 2 weeks has been taking clonidine 0.1 twice every day. - Resume home metoprolol 50mg  daily and lisinopril 20 BID - Increase clonidine to 0.2 mg Q12h scheduled dosing. -Resume home spironolactone - IV labetalol 5mg  PRN. Patient had significant drop in blood pressure- systolic  119 with 10mg  dose.  Elevated troponin- 41 >> 182 > 284. NO CAD hx.  Patient had an episode of tachycardia heart rates up to 130s in ED.  Likely troponin leak from demand ischemia 2/2 elevated blood pressure vs tachycardia.  Chronic difficulty breathing, no chest pain.  EKG without significant change, old LBBB. -Trend troponin - Consider cardiology consult in a.m - Resume home statins, metoprolol, hold Eliquis for now pending Gi eval.  Dyspnea-chronic.  Intermittent.  At the time of my evaluation patient is calm without any difficulty breathing.  O2 sats greater than 99% on room air.  Port chest x-ray without acute abnormality.  No chest pain, no wheezing.  Low suspicion for thromboembolic disease, reports compliance with medications, Eliquis included.  Differentials-anxiety, deconditioning advanced age, ? Paroxysmal atrial fibrillation episodes causing symptoms. -Resume home Xanax 0.25 mg nightly -COVID-19 test pending.  History of COPD-stable.  May benefit from better blood pressure lowering effect of Coreg rather than metoprolol but held off due to history of COPD. -As needed albuterol inhaler  History of atrial fibrillation, Moderate Mitral regurg -heart rate mostly 70s to 80s. -Hold Eliquis for now, pending GI evaluation, hemoglobin stable. -Resume home amiodarone and metoprolol.  Diastolic CHF history-stable and compensated.  BNP slightly elevated at 221.  Weight is stable.  Last echo 2016, I am unable to view results.   -Hold home as needed Lasix  20 mg held for now. -Resume home statins.  DVT prophylaxis: SCDs for now, pending GI evaluation. Code Status: Full Family Communication: Daughter at bedside Disposition Plan: Per rounding team Consults called: Gastroenterology Admission status: Obs, Step down. Initially ordered tele bed, floor nurses said patients troponin is high and up-trending, they cannot handle it on the floor. Will change order if troponin down trends.   Bethena Roys MD [facility]  03/25/2019, 7:46 PM

## 2019-03-25 NOTE — ED Notes (Signed)
Pt family member approached registration and reported pt was complaining of shortness of breath. Pt brought in to triage, EKG performed, Vital Signs updated. Consulted with Charge pt taken to [facility]. EDP given EKG and updated on pt status.   Pt alert,airway patent, mild dyspnea noted at rest. Pt denies pain and family reports history of same over last few days.

## 2019-03-25 NOTE — ED Notes (Signed)
Date and time results received: 03/25/19 16:34 (use smartphrase ".now" to insert current time)  Test: trop  Critical Value: 182  Name of Provider Notified: Rodell Perna PA  Orders Received? Or Actions Taken?:see emr

## 2019-03-25 NOTE — ED Provider Notes (Signed)
[facility] EMERGENCY DEPARTMENT Provider Note   CSN: [id number] Arrival date & time: 03/25/19  0932     History   Chief Complaint Chief Complaint  Patient presents with  . Dysphagia    HPI [name] is a 85 y.o. female with history of COPD, atrial fibrillation, hypertension, hyperlipidemia, hypothyroidism, mild mitral regurgitation, type 2 diabetes mellitus presents today brought in by daughter for evaluation of hypertension, worsening shortness of breath for 1 month, and dysphagia.  He has a history of dysphagia and esophageal stricture that required dilatation previously.  [name] has had worsening dysphagia and has been followed by her gastroenterologist for this but they have been holding off on any procedures due to her uncontrolled hypertension.  Most recent office visit from 01/31/2019 notes that [name] was offered barium pill esophagram and EGD with esophageal dilatation and [name] declined both at the time but then called back stating that [name] was willing to try barium pill esophagram but this has not yet been scheduled.  Daughter notes that her blood pressures have been difficult to control for at least the last month where her baseline is usually around 150/70 [name] has been running around 200/90.  [name] takes as needed clonidine up to 3 times daily as needed for elevated blood pressures which daughter notes works temporarily.  [name] also notes that the patient has appeared short of breath over the last month but over the last 3 days [name] started using her rescue inhaler twice daily which [name] has never used before.  Yesterday the patient did vomit up her food after attempting to eat ravioli.  Patient denies chest pain, abdominal pain, nausea or vomiting at this time.  [name] has been having ongoing issues with nasal congestion and sore throat and has had courses of multiple antibiotics and allergy medicines.  Most recently [name] started a course of amoxicillin 500 mg 3 times daily on 03/13/2019, has 2  tablets left.     The history is provided by the patient.    Past Medical History:  Diagnosis Date  . Anxiety   . Atrial fibrillation (HCC)   . COPD (chronic obstructive pulmonary disease) (HCC)   . Depression   . Hyperlipidemia   . Hypertension   . Hypothyroidism   . Mitral regurgitation    Mild  . Palpitations    PAT/EAT/NSVT, normal LVEF  . Type 2 diabetes mellitus [facility])     Patient Active Problem List   Diagnosis Date Noted  . Dysphagia 01/31/2019  . Throat tightness 12/14/2018  . Throat dryness 12/14/2018  . Dry mouth, unspecified 12/14/2018  . Iron deficiency anemia 05/12/2017  . GERD (gastroesophageal reflux disease) 03/17/2016  . Constipation 10/29/2015  . Gastric erosions 02/03/2015  . Sinus bradycardia 09/26/2014  . Dyspnea on exertion 09/18/2014  . Chronic diastolic heart failure (HCC) 09/18/2014  . Dizziness 09/18/2014  . Atrial flutter with rapid ventricular response (HCC)   . Atrial fibrillation with RVR (HCC) 09/09/2014  . Atrial flutter (HCC) 07/13/2014  . Hyponatremia 07/13/2014  . Diabetes (HCC) 07/13/2014  . Hypothyroidism 07/13/2014  . Tachycardia 07/13/2014  . Dysphagia, pharyngoesophageal phase 04/16/2014  . Neurogenic pain of lower extremity 03/15/2012  . Left bundle branch block 03/02/2011  . RENAL INSUFFICIENCY 05/22/2010  . Essential hypertension, benign 10/09/2009  . Mitral regurgitation 10/09/2009  . HYPERLIPIDEMIA-MIXED 02/12/2009  . Palpitations 02/12/2009    Past Surgical History:  Procedure Laterality Date  . ESOPHAGOGASTRODUODENOSCOPY N/A 04/17/2014   RMR: probable cervical esophageal web and critical  Schzki's ring status post dilation as described above. hiatal hernia. Antral erosions status post gastric biopsy.  . ESOPHAGOGASTRODUODENOSCOPY N/A 05/12/2016   Dr. Jena Gauss: moderate Schatzki ring s/p dilation. moderate sized hiatal hernia  . MALONEY DILATION N/A 04/17/2014   Procedure: [name] DILATION;  Surgeon: Corbin Ade, MD;  Location[facility];  Service: Endoscopy;  Laterality: N/A;  [name] DILATION N/A 05/12/2016   Procedure: [name] DILATION;  Surgeon: Corbin Ade, MD;  Location: [facility]O SUITE;  Service: Endoscopy;  Laterality: N/A;  . none       OB History    Gravida  5   Para  5   Term  4   Preterm  1   AB      Living  5     SAB      TAB      Ectopic      Multiple      Live Births               Home Medications    Prior to Admission medications   Medication Sig Start Date End Date Taking? Authorizing Provider  acetaminophen (TYLENOL) 650 MG CR tablet Take 650 mg by mouth as needed for pain.    [provider]  albuterol (PROVENTIL HFA;VENTOLIN HFA) 108 (90 Base) MCG/ACT inhaler Inhale 1-2 puffs into the lungs every 4 (four) hours as needed for wheezing or shortness of breath.    [provider]  ALPRAZolam Prudy Feeler) 0.25 MG tablet Take 0.25 mg by mouth at bedtime as needed for anxiety.    [provider]  amiodarone (PACERONE) 200 MG tablet TAKE 1/2 TABLET BY MOUTH EVERY DAY 02/26/19   Jonelle Sidle, MD  antiseptic oral rinse (BIOTENE) LIQD 15 mLs by Mouth Rinse route as needed for dry mouth.    [provider]  atorvastatin (LIPITOR) 40 MG tablet Take 40 mg by mouth daily at 6 PM. 09/27/11   Jonelle Sidle, MD  bisacodyl (DULCOLAX) 5 MG EC tablet Take 5 mg by mouth daily as needed for moderate constipation.    [provider]  bismuth subsalicylate (PEPTO BISMOL) 262 MG/15ML suspension Take 30 mLs by mouth daily as needed for indigestion.    [provider]  cetirizine (ZYRTEC) 10 MG tablet Take 5-10 mg by mouth daily as needed for allergies or rhinitis.     [provider]  cloNIDine (CATAPRES) 0.1 MG tablet TAKE 1 TABLET BY MOUTH UP TO THREE TIMES DAILY IF BLOOD PRESSURE IS GREATER THAN 140/90 07/11/18   [provider]  COD LIVER OIL PO Take 15 mLs by mouth daily.    [provider]  EASY COMFORT LANCETS MISC TO test blood glucose ONCE daily 07/11/18   [provider]  ELIQUIS 2.5 MG TABS tablet TAKE 1 TABLET BY MOUTH TWICE DAILY 03/08/19   Jonelle Sidle, MD  furosemide (LASIX) 20 MG tablet TAKE 1 TABLET BY MOUTH EVERY DAY AS NEEDED FOR FLUID 06/23/16   Jonelle Sidle, MD  guaiFENesin-dextromethorphan (ROBITUSSIN DM) 100-10 MG/5ML syrup Take 5 mLs by mouth every 4 (four) hours as needed for cough (SUGAR FREE).    [provider]  Lancet Devices (ADJUSTABLE LANCING DEVICE) MISC USE TO check blood glucose 04/24/18   [provider]  levothyroxine (SYNTHROID, LEVOTHROID) 25 MCG tablet Take 25 mcg by mouth daily before breakfast.    [provider]  lisinopril (PRINIVIL,ZESTRIL) 20 MG tablet TAKE ONE TABLET BY  MOUTH TWICE DAILY 05/12/17   Jonelle Sidle, MD  metoprolol succinate (TOPROL-XL) 50 MG 24 hr tablet TAKE ONE TABLET BY MOUTH EVERY MORNING immediately following a meal. 05/03/18   Jonelle Sidle, MD  omeprazole (PRILOSEC) 20 MG capsule TAKE 1 CAPSULE BY MOUTH EVERY DAY 09/29/15   Tiffany Kocher, PA-C  polyethylene glycol (MIRALAX / GLYCOLAX) packet Take 17 g 2 (two) times a week by mouth.    [provider]  potassium chloride (MICRO-K) 10 MEQ CR capsule TAKE TWO CAPSULES BY MOUTH DAILY FOR low potassium 04/22/17   [provider]  spironolactone (ALDACTONE) 25 MG tablet TAKE 1 TABLET BY MOUTH EVERY MORNING 10/02/18   Jonelle Sidle, MD  umeclidinium bromide (INCRUSE ELLIPTA) 62.5 MCG/INH AEPB Inhale 1 puff into the lungs daily.    [provider]    Family History Family History  Problem Relation Age of Onset  . Heart failure Mother        Died in her 73s  . Tuberculosis Father        Died at young age  . Cancer Brother   . Arthritis Other   . Colon cancer Other     Social History Social History   Tobacco Use  . Smoking status: Never Smoker  . Smokeless tobacco:  Never Used  Substance Use Topics  . Alcohol use: No    Alcohol/week: 0.0 standard drinks  . Drug use: No     Allergies   Chocolate   Review of Systems Review of Systems  Constitutional: Negative for chills and fever.  HENT: Positive for trouble swallowing (chronic, ongoing).   Respiratory: Positive for shortness of breath.   Cardiovascular: Positive for leg swelling (chronic).  Gastrointestinal: Positive for vomiting.  All other systems reviewed and are negative.    Physical Exam Updated Vital Signs BP (!) 185/70   Pulse 72   Temp 98 F (36.7 C) (Oral)   Resp 16   Ht  (1.676 m)   Wt 59 kg   SpO2 100%   BMI 20.98 kg/m   Physical Exam Vitals signs and nursing note reviewed.  Constitutional:      General: [name] is not in acute distress.    Appearance: [name] is well-developed.     Comments: Thin, chronically ill in appearance  HENT:     Head: Normocephalic and atraumatic.     Mouth/Throat:     Comments: Tolerating secretions without difficulty.  No abnormal phonation Eyes:     General:        Right eye: No discharge.        Left eye: No discharge.     Conjunctiva/sclera: Conjunctivae normal.  Neck:     Vascular: No JVD.     Trachea: No tracheal deviation.  Cardiovascular:     Rate and Rhythm: Regular rhythm. Tachycardia present.     Pulses: Normal pulses.     Comments: 2+ radial and DP/PT pulses bilaterally.  2+ pitting edema of the bilateral lower extremities.  Daughter reports this is chronic and unchanged Pulmonary:     Effort: Pulmonary effort is normal.     Comments: Bibasilar crackles noted. Abdominal:     General: Bowel sounds are normal. There is no distension.     Palpations: Abdomen is soft.     Tenderness: There is no abdominal tenderness. There is no guarding.  Skin:    General: Skin is warm and dry.     Findings: No erythema.  Neurological:  Mental Status: [name] is alert.  Psychiatric:        Behavior: Behavior normal.      ED  Treatments / Results  Labs (all labs ordered are listed, but only abnormal results are displayed) Labs Reviewed  BASIC METABOLIC PANEL - Abnormal; Notable for the following components:      Result Value   Sodium 133 (*)    Glucose, Bld 110 (*)    GFR calc non Af Amer 59 (*)    All other components within normal limits  BRAIN NATRIURETIC PEPTIDE - Abnormal; Notable for the following components:   B Natriuretic Peptide 221.0 (*)    All other components within normal limits  TROPONIN I (HIGH SENSITIVITY) - Abnormal; Notable for the following components:   Troponin I (High Sensitivity) 41 (*)    All other components within normal limits  TROPONIN I (HIGH SENSITIVITY) - Abnormal; Notable for the following components:   Troponin I (High Sensitivity) 182 (*)    All other components within normal limits  SARS CORONAVIRUS 2 (TAT 6-24 HRS)  CBC WITH DIFFERENTIAL/PLATELET  URINALYSIS, ROUTINE W REFLEX MICROSCOPIC  TROPONIN I (HIGH SENSITIVITY)    EKG EKG Interpretation  Date/Time:  Sunday March 25 2019 12:36:18 EST Ventricular Rate:  134 PR Interval:  180 QRS Duration: 130 QT Interval:  288 QTC Calculation: 430 R Axis:   70 Text Interpretation: Sinus tachycardia Left atrial enlargement Left ventricular hypertrophy with QRS widening and repolarization abnormality Cannot rule out Septal infarct , age undetermined Abnormal ECG Confirmed by Donnetta Hutchingook, Brian ([id number]) on 03/25/2019 12:52:11 PM   Radiology Dg Chest Port 1 View  Result Date: 03/25/2019 CLINICAL DATA:  Trouble swallowing. Pt also reports intermittent shortness of breath and congestion, chest pain, hypertension EXAM: PORTABLE CHEST 1 VIEW COMPARISON:  03/05/2018 FINDINGS: Cardiac silhouette borderline enlarged. No mediastinal or hilar masses. No evidence of adenopathy. Lungs are clear.  No pleural effusion or pneumothorax. Skeletal structures are grossly intact. IMPRESSION: No acute cardiopulmonary disease. Electronically Signed    By: Amie Portlandavid  Ormond M.D.   On: 03/25/2019 14:42    Procedures .Critical Care Performed by: Jeanie SewerFawze, Jalynn Betzold A, PA-C Authorized by: Jeanie SewerFawze, Martisha Toulouse A, PA-C   Critical care provider statement:    Critical care time (minutes):  45   Critical care was necessary to treat or prevent imminent or life-threatening deterioration of the following conditions:  Cardiac failure and circulatory failure   Critical care was time spent personally by me on the following activities:  Discussions with consultants, evaluation of patient's response to treatment, examination of patient, ordering and performing treatments and interventions, ordering and review of laboratory studies, ordering and review of radiographic studies, pulse oximetry, re-evaluation of patient's condition, obtaining history from patient or surrogate and review of old charts   (including critical care time)  Medications Ordered in ED Medications  lisinopril (ZESTRIL) tablet 20 mg (has no administration in time range)  labetalol (NORMODYNE) injection 10 mg (has no administration in time range)  metoprolol succinate (TOPROL-XL) 24 hr tablet 50 mg (has no administration in time range)  cloNIDine (CATAPRES) tablet 0.1 mg (0.1 mg Oral Given 03/25/19 1548)     Initial Impression / Assessment and Plan / ED Course  I have reviewed the triage vital signs and the nursing notes.  Pertinent labs & imaging results that were available during my care of the patient were reviewed by me and considered in my medical decision making (see chart for details).  Patient presents brought in by daughter for evaluation of persistent hypertension, worsening shortness of breath, dysphagia.  While in the waiting room the patient began to complain of shortness of breath so an EKG was obtained which showed tachycardia up to the 130s with widened QRS and diffusely ischemic changes.  On my assessment [name] is tachycardic but resting fairly comfortably.  [name] is not complaining  of any chest pain or abdominal pain.  Daughter noted vomiting yesterday after meals and worsening shortness of breath.  [name] was scheduled for a barium pill esophagram but this has been postponed twice due to her persistently elevated blood pressures.  Daughter has been needing to give the patient as needed clonidine twice daily for the last week or so due to persistently elevated blood pressures.  Initial troponin elevated at 41.  I did obtain a repeat EKG which shows normal sinus rhythm without any pharmacologic intervention in the ED.  CONSULT: Spoke with Dr. Acie Fredrickson with cardiology who reviewed the patient's EKGs.  He suspect that the patient had an episode of paroxysmal A. fib then converted to normal sinus rhythm on repeat EKG.  [name] is anticoagulated on Eliquis for her A. fib.  He does not feel strongly about admission at this time but recommends further work-up with serial troponins.  If [name] is deemed stable for discharge home from our standpoint then he recommends increasing her extended release to metoprolol to 75 mg daily at least with close cardiology follow-up.  Remainder of lab work reviewed by me shows no leukocytosis, no anemia, no metabolic arrangements, no renal insufficiency.  Her BNP is very mildly elevated and her chest x-ray shows no evidence of pleural effusion, pneumonia, or pneumothorax.  Doubt PE, dissection, cardiac tamponade, or esophageal rupture.  While in the ED the patient did have persistently elevated blood pressure so was given a dose of clonidine.  [name] had little improvement.  Second troponin increased to 182.  Likely in the setting of demand ischemia.  I consulted Dr. Acie Fredrickson again who recommends hospitalist admission with cardiology consultation.  Spoke with Dr. Denton Brick with [facility] hospitalist service who agrees to assume care of patient and bring her into the hospital for further evaluation and management of her hypertensive emergency.  Patient and daughter are amenable to  admission at this time.  Final Clinical Impressions(s) / ED Diagnoses   Final diagnoses:  Hypertensive emergency  Elevated troponin    ED Discharge Orders    None       Debroah Baller 03/25/19 1834    Nat Christen, MD 03/26/19 0800

## 2019-03-25 NOTE — ED Notes (Signed)
Date and time results received: 03/25/19 1915 (use smartphrase ".now" to insert current time)  Test: Troponin Critical Value: 284  Name of Provider Notified: Emokpae  Orders Received? Or Actions Taken?: Orders Received - See Orders for details

## 2019-03-25 NOTE — ED Triage Notes (Addendum)
Pt reports is supposed to have to have esophagus stretched but reports has been rescheduled x2 due to high blood pressure. Pt reports trouble eating and gets "choked up" at times. Pt also reports intermittent shortness of breath and congestion. nad noted.

## 2019-03-25 NOTE — ED Notes (Signed)
Patient offered fluids. Patient tolerating water.

## 2019-03-25 NOTE — ED Notes (Addendum)
Consulted EDP regarding next steps of pt care plan. No orders given at this time,possible GI consult once pt is roomed in ED.

## 2019-03-26 ENCOUNTER — Observation Stay (HOSPITAL_COMMUNITY): Payer: Medicare Other

## 2019-03-26 DIAGNOSIS — I4811 Longstanding persistent atrial fibrillation: Secondary | ICD-10-CM | POA: Diagnosis not present

## 2019-03-26 DIAGNOSIS — R1310 Dysphagia, unspecified: Secondary | ICD-10-CM

## 2019-03-26 DIAGNOSIS — E43 Unspecified severe protein-calorie malnutrition: Secondary | ICD-10-CM

## 2019-03-26 DIAGNOSIS — R0600 Dyspnea, unspecified: Secondary | ICD-10-CM | POA: Diagnosis not present

## 2019-03-26 DIAGNOSIS — I5032 Chronic diastolic (congestive) heart failure: Secondary | ICD-10-CM

## 2019-03-26 DIAGNOSIS — R778 Other specified abnormalities of plasma proteins: Secondary | ICD-10-CM | POA: Diagnosis not present

## 2019-03-26 DIAGNOSIS — R7989 Other specified abnormal findings of blood chemistry: Secondary | ICD-10-CM

## 2019-03-26 DIAGNOSIS — I161 Hypertensive emergency: Secondary | ICD-10-CM

## 2019-03-26 DIAGNOSIS — I10 Essential (primary) hypertension: Secondary | ICD-10-CM

## 2019-03-26 LAB — TROPONIN I (HIGH SENSITIVITY): Troponin I (High Sensitivity): 205 ng/L (ref <18)

## 2019-03-26 LAB — SARS CORONAVIRUS 2 (TAT 6-24 HRS): SARS Coronavirus 2: NEGATIVE

## 2019-03-26 LAB — MRSA PCR SCREENING: MRSA by PCR: NEGATIVE

## 2019-03-26 MED ORDER — PANTOPRAZOLE SODIUM 40 MG PO TBEC
40.0000 mg | DELAYED_RELEASE_TABLET | Freq: Every day | ORAL | Status: DC
  Administered 2019-03-26: 10:00:00 40 mg via ORAL
  Filled 2019-03-26: qty 1

## 2019-03-26 MED ORDER — ENSURE ENLIVE PO LIQD: 237.0000 mL | Freq: Two times a day (BID) | ORAL | Status: DC

## 2019-03-26 MED ORDER — METOPROLOL TARTRATE 50 MG PO TABS: 50.0000 mg | ORAL_TABLET | Freq: Two times a day (BID) | ORAL | 1 refills | Status: DC

## 2019-03-26 NOTE — Care Management Obs Status (Signed)
[place] NOTIFICATION   Patient Details  Name: [name] MRN: [id number] Date of Birth: 06-Mar-1927   Medicare Observation Status Notification Given:  Yes    Tommy Medal 03/26/2019, 3:32 PM

## 2019-03-26 NOTE — Evaluation (Signed)
Clinical/Bedside Swallow Evaluation Patient Details  Name: [name] MRN: [id number] Date of Birth: 11/10/1926  Today's Date: 03/26/2019 Time: SLP Start Time (ACUTE ONLY): 1312 SLP Stop Time (ACUTE ONLY): 1336 SLP Time Calculation (min) (ACUTE ONLY): 24 min  Past Medical History:  Past Medical History:  Diagnosis Date  . Anxiety   . Atrial fibrillation (HCC)   . COPD (chronic obstructive pulmonary disease) (HCC)   . Depression   . Hyperlipidemia   . Hypertension   . Hypothyroidism   . Mitral regurgitation    Mild  . Palpitations    PAT/EAT/NSVT, normal LVEF  . Type 2 diabetes mellitus (HCC)    Past Surgical History:  Past Surgical History:  Procedure Laterality Date  . 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 hiatal hernia. Antral erosions status post gastric biopsy.  . ESOPHAGOGASTRODUODENOSCOPY N/A 05/12/2016   Dr. Jena Gauss: moderate Schatzki ring s/p dilation. moderate sized hiatal hernia  . MALONEY DILATION N/A 04/17/2014   Procedure: [name] DILATION;  Surgeon: Corbin Ade, MD;  [facility];  Service: Endoscopy;  Laterality: N/A;  [name] DILATION N/A 05/12/2016   Procedure: [name] DILATION;  Surgeon: Corbin Ade, MD;  Location: [facility];  Service: Endoscopy;  Laterality: N/A;  . none     HPI:  [name] is a 85 y.o. year old female with history of dysphagia, Schatzki's ring s/p most recent dilation in 2017, presenting yesterday with shortness of breath and reports of dysphagia worsening over the past week. Seen most recently as outpatient at [facility] Sept 2020 and was noted to be a difficult historian. She had declined both EGD and BPE at time of appt. At that time, was not clear if dealing with true esophageal dysphagia. Patient then agreed to BPE as outpatient.  On Eliquis with history of afib, held at time of admission. Last dose on  Saturday evening, confirmed by patient and daughter, [name]. Troponins elevated and peaked at 284 yesterday, nost recently 205 yesterday evening. Cardiology consult pending. CXR unrevealing. Speech eval pending as well.    Assessment / Plan / Recommendation Clinical Impression  Clinical swallow evaluation completed at bedside. Oral motor examination is WNL. Pt wears U/L dentures for meals. Pt assessed with ice chips, cup/straw sips thin water, puree, and regular textures. Pt with one strong cough after taking sequential cup sips thin water and demonstrated prolonged oral phase with regular textures. Pt and daughter describe both pharyngeal (coughing, "strangled") and esophageal symptoms (globus, early satiety, and regurgitation) and she has lost a significant amount of weight. Will proceed with MBSS later this afternoon. Results and recommendations to follow. Above to RN.  SLP Visit Diagnosis: Dysphagia, unspecified (R13.10)    Aspiration Risk  Mild aspiration risk    Diet Recommendation          Other  Recommendations Recommended Consults: Consider esophageal assessment   Follow up Recommendations (pending)      Frequency and Duration min 2x/week  1 week       Prognosis Prognosis for Safe Diet Advancement: Good      Swallow Study   General Date of Onset: 03/25/19 HPI: KERYL GHOLSON is a 85 y.o. year old female with history of dysphagia, Schatzki's ring s/p most recent dilation in 2017, presenting yesterday with shortness of breath and reports of dysphagia worsening over the past week. Seen most recently as outpatient at [facility] Sept  2020 and was noted to be a difficult historian. She had declined both EGD and BPE at time of appt. At that time, was not clear if dealing with true esophageal dysphagia. Patient then agreed to BPE as outpatient.  On Eliquis with history of afib, held at time of admission. Last dose on Saturday evening, confirmed by patient and daughter, [name].  Troponins elevated and peaked at 284 yesterday, nost recently 205 yesterday evening. Cardiology consult pending. CXR unrevealing. Speech eval pending as well.  Type of Study: Bedside Swallow Evaluation Diet Prior to this Study: NPO Temperature Spikes Noted: No Respiratory Status: Nasal cannula History of Recent Intubation: No Behavior/Cognition: Alert;Cooperative;Pleasant mood Oral Cavity Assessment: Within Functional Limits Oral Care Completed by SLP: Recent completion by staff Oral Cavity - Dentition: Dentures, top;Dentures, bottom Vision: Functional for self-feeding Self-Feeding Abilities: Able to feed self Patient Positioning: Upright in bed Baseline Vocal Quality: Normal;Low vocal intensity Volitional Cough: Strong Volitional Swallow: Able to elicit    Oral/Motor/Sensory Function Overall Oral Motor/Sensory Function: Within functional limits   Ice Chips Ice chips: Within functional limits Presentation: Spoon   Thin Liquid Thin Liquid: Impaired Presentation: Cup;Self Fed;Straw Pharyngeal  Phase Impairments: Cough - Delayed(after sequential cup sips)    Nectar Thick Nectar Thick Liquid: Not tested   Honey Thick Honey Thick Liquid: Not tested   Puree Puree: Within functional limits Presentation: Self Fed;Spoon   Solid     Solid: Within functional limits Presentation: Self Fed     Thank you,  Genene Churn, [place]  Eljay Lave 03/26/2019,1:45 PM

## 2019-03-26 NOTE — Progress Notes (Signed)
CARDIOLOGY CONSULT NOTE       Patient ID: [name] MRN: [id number] DOB/AGE: 1926-11-23 85 y.o.  Admit date: 03/25/2019 Referring Physician: Dyann Kief Primary Physician: Lemmie Evens, MD Primary Cardiologist: Domenic Polite Reason for Consultation: Elevated troponin   Active Problems:   Dysphagia   HPI:  85 y.o. no history of CAD. Admitted with multiple somatic complaints including dysphagia and dry mouth. Has had choking on solid foods BP has been elevated. Denies chest pain palpitations or syncope. She has chronic dyspnea and COPD with history of diastolic CHF. C[place]diac history includes, HTN labile, with LBBB PAF and history of MR moderate by echo in 2016 She has no PND, orthopnea has been in NSR Troponin elevated from 41 to 205 no real delta after first day. ECG with chronic LBBB  ROS All other systems reviewed and negative except as noted above  Past Medical History:  Diagnosis Date  . Anxiety   . Atrial fibrillation ([place])   . COPD (chronic obstructive pulmonary disease) ([place])   . Depression   . Hyperlipidemia   . Hypertension   . Hypothyroidism   . Mitral regurgitation    Mild  . Palpitations    PAT/EAT/NSVT, normal LVEF  . Type 2 diabetes mellitus ([facility])     Family History  Problem Relation Age of Onset  . Heart failure Mother        Died in her 79s  . Tuberculosis Father        Died at young age  . Cancer Brother   . Arthritis Other   . Colon cancer Other     Social History   Socioeconomic History  . Marital status: Married    Spouse name: Not on file  . Number of children: Not on file  . Years of education: 1  . Highest education level: Not on file  Occupational History  . Occupation: Retired    Fish farm manager: RETIRED  Social Needs  . Financial resource strain: Not on file  . Food insecurity    Worry: Not on file    Inability: Not on file  . Transportation needs    Medical: Not on file    Non-medical: Not on file  Tobacco Use  . Smoking status:  Never Smoker  . Smokeless tobacco: Never Used  Substance and Sexual Activity  . Alcohol use: No    Alcohol/week: 0.0 standard drinks  . Drug use: No  . Sexual activity: Never  Lifestyle  . Physical activity    Days per week: Not on file    Minutes per session: Not on file  . Stress: Not on file  Relationships  . Social Herbalist on phone: Not on file    Gets together: Not on file    Attends religious service: Not on file    Active member of club or organization: Not on file    Attends meetings of clubs or organizations: Not on file    Relationship status: Not on file  . Intimate partner violence    Fear of current or ex partner: Not on file    Emotionally abused: Not on file    Physically abused: Not on file    Forced sexual activity: Not on file  Other Topics Concern  . Not on file  Social History Narrative  . Not on file    Past Surgical History:  Procedure Laterality Date  . 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 hiatal hernia. Antral erosions status post gastric biopsy.  . ESOPHAGOGASTRODUODENOSCOPY N/A 05/12/2016   Dr. Jena Gauss: moderate Schatzki ring s/p dilation. moderate sized hiatal hernia  . MALONEY DILATION N/A 04/17/2014   Procedure: [name] DILATION;  Surgeon: Corbin Ade, MD;  Location: [facility];  Service: Endoscopy;  Laterality: N/A;  [name] DILATION N/A 05/12/2016   Procedure: [name] DILATION;  Surgeon: Corbin Ade, MD;  Location: [facility];  Service: Endoscopy;  Laterality: N/A;  . none       . ALPRAZolam  0.25 mg Oral QHS  . amiodarone  100 mg Oral Daily  . atorvastatin  40 mg Oral q1800  . Chlorhexidine Gluconate Cloth  6 each Topical Daily  . cloNIDine  0.2 mg Oral BID  . feeding supplement (ENSURE ENLIVE)  237 mL Oral BID BM  . levothyroxine  25 mcg Oral QAC breakfast  . lisinopril  20 mg Oral BID  .  metoprolol tartrate  50 mg Oral BID  . pantoprazole  40 mg Oral Daily  . spironolactone  25 mg Oral q morning - 10a  . umeclidinium bromide  1 puff Inhalation Daily     Physical Exam: Blood pressure (!) 110/54, pulse (!) 49, temperature 98.3 F (36.8 C), temperature source Oral, resp. rate 16, height 5\' 6"  (1.676 m), weight 53.8 kg, SpO2 100 %.    Affect appropriate Elderly black female  HEENT: normal Neck supple with no adenopathy JVP normal no bruits no thyromegaly Lungs clear with no wheezing and good diaphragmatic motion Heart:  S1/S2 MR  murmur, no rub, gallop or click PMI normal Abdomen: benighn, BS positve, no tenderness, no AAA no bruit.  No HSM or HJR Distal pulses intact with no bruits No edema Neuro non-focal Skin warm and dry No muscular weakness   Labs:   Lab Results  Component Value Date   WBC 4.1 03/25/2019   HGB 12.3 03/25/2019   HCT 38.7 03/25/2019   MCV 92.6 03/25/2019   PLT 201 03/25/2019    Recent Labs  Lab 03/25/19 1323  NA 133*  K 4.0  CL 100  CO2 22  BUN 8  CREATININE 0.87  CALCIUM 9.4  GLUCOSE 110*   Lab Results  Component Value Date   CKTOTAL 85 07/13/2014   CKMB 2.7 07/13/2014   TROPONINI <0.03 04/22/2016    Lab Results  Component Value Date   CHOL 142 07/14/2014   CHOL 128 07/20/2010   CHOL  05/08/2010    129        ATP III CLASSIFICATION:  <200     mg/dL   Desirable  05/10/2010  mg/dL   Borderline High  782-423    mg/dL   High          Lab Results  Component Value Date   HDL 58 07/14/2014   HDL 57 07/20/2010   HDL 53 05/08/2010   Lab Results  Component Value Date   LDLCALC 78 07/14/2014   LDLCALC 60 07/20/2010   LDLCALC  05/08/2010    64        Total Cholesterol/HDL:CHD Risk Coronary Heart Disease Risk Table                     Men   Women  1/2 Average Risk   3.4   3.3  Average Risk       5.0   4.4  2 X Average Risk   9.6  7.1  3 X Average Risk  23.4   11.0        Use the calculated Patient Ratio above  and the CHD Risk Table to determine the patient's CHD Risk.        ATP III CLASSIFICATION (LDL):  <100     mg/dL   Optimal  [id number]  mg/dL   Near or Above                    Optimal  130-159  mg/dL   Borderline  [id number]  mg/dL   High  >956>190     mg/dL   Very High   Lab Results  Component Value Date   TRIG 32 07/14/2014   TRIG 54 07/20/2010   TRIG 60 05/08/2010   Lab Results  Component Value Date   CHOLHDL 2.4 07/14/2014   CHOLHDL 2.2 Ratio 07/20/2010   CHOLHDL 2.4 05/08/2010   No results found for: LDLDIRECT    Radiology: Dg Chest Port 1 View  Result Date: 03/25/2019 CLINICAL DATA:  Trouble swallowing. Pt also reports intermittent shortness of breath and congestion, chest pain, hypertension EXAM: PORTABLE CHEST 1 VIEW COMPARISON:  03/05/2018 FINDINGS: Cardiac silhouette borderline enlarged. No mediastinal or hilar masses. No evidence of adenopathy. Lungs are clear.  No pleural effusion or pneumothorax. Skeletal structures are grossly intact. IMPRESSION: No acute cardiopulmonary disease. Electronically Signed   By: Amie Portlandavid  Ormond M.D.   On: 03/25/2019 14:42    EKG: SR LBBB no acute changes    ASSESSMENT AND PLAN:   1. Elevated troponin:  No signs of acute coronary syndrome no chest pain Given age no indication for stress testing 2. Dyspnea:  CXR NAD BNP only 221 history of moderate MR by echo 2016 with EF 50-55% will update echo 3. LBBB:  Chronic no high grade AV block 4. HTN:  Consider adding hydralazine as needed per primary team 5. PAF:  In NSR on telemetry continue amiodarone low dose eliquis on hold  6. Dysphagia;  Barium swallow pending   Signed: Charlton Hawseter Sharday Michl 03/26/2019, 8:20 AM

## 2019-03-26 NOTE — Progress Notes (Signed)
Modified Barium Swallow Progress Note  Patient Details  Name: [name] MRN: [id number] Date of Birth: [birth year]  Today's Date: 03/26/2019  Modified Barium Swallow completed.  Full report located under Chart Review in the Imaging Section.  Brief recommendations include the following:  Clinical Impression  Pt presents with moderate oropharyngeal dysphagia characterized by reduced lingual strength with prolonged and inefficient oral phase, reduced bolus cohesiveness, premature spillage, piecemeal deglutition, delay in swallow trigger after liquids spill towards pyriforms, reduced tongue base retraction and epiglottic deflection (worse with puree and solids) resulting in vallecular and pyriform pooling after the swallow. Pt with trace penetration and aspiration of thins with cup and straw sips due to premature spillage, reduced laryngeal closure, and residuals pooling after the swallow which was variably sensed. Pt did cough after aspirating a small amount when taking straw sips thin, however it was not removed. Chin tuck was implemented with cup sips thin and found to be effective. Pt with significant vallecular residue with purees and solids which was only minimally decreased with thin liquid wash. NTL were given and noted to cause increased residuals, although were not aspirated during the study. Recommend D2 and thin liquids via cup sip with a chin tuck, po medications crushed as able in puree, take small bites/sips, alternate solids/liquids, oral care after meals, and Pt to sit upright for all eating/drinking and remain upright for at least 30 minutes after. SLP will follow during acute stay for Pt/family education and implementation of pharyngeal swallowing exercises as able. She would benefit from RD consult to assist with meal selections given severity of dysphagia with solids.     Swallow Evaluation Recommendations       SLP Diet Recommendations: Dysphagia 2 (Fine chop) solids;Thin  liquid   Liquid Administration via: Cup;No straw   Medication Administration: Crushed with puree   Supervision: Patient able to self feed;Intermittent supervision to cue for compensatory strategies   Compensations: Small sips/bites;Multiple dry swallows after each bite/sip;Follow solids with liquid;Clear throat intermittently;Chin tuck(chin tuck with cup sips thin)   Postural Changes: Remain semi-upright after after feeds/meals (Comment);Seated upright at 90 degrees   Oral Care Recommendations: Oral care BID;Staff/trained caregiver to provide oral care   Other Recommendations: Clarify dietary restrictions   Thank you,  Genene Churn, [place]  [place] 03/26/2019,3:46 PM

## 2019-03-26 NOTE — Progress Notes (Signed)
Patient alert and oriented x4. Patient sometimes needing reorientation to situation as it progresses. Patient tolerated PO meds (crushed in applesauce) with sips of water well. Patient ate 75% of dinner. No complaints of pain, shortness of breath, chest pain, dizziness, nausea or vomiting. IV removed without complications. Dr. Dyann Kief was made aware of heart rate dipping into the 40's at times this afternoon. Patient asymptomatic and resting in the bed at the time. Discharge instructions, follow up appointment information and medication education gone over with patient in room and mainly with patient daughter Lelon Frohlich). Writer offered to go over with patient more but patient hard of hearing and daughter stated the patient would just get confused.  Daughter expressed full understanding of instructions, appointment information and education. Patient discharged home with all belongings via car (Daughter taking patient home).

## 2019-03-26 NOTE — Progress Notes (Signed)
Initial Nutrition Assessment  DOCUMENTATION CODES:      INTERVENTION:  Magic cup TID with meals, each supplement provides 290 kcal and 9 grams of protein   Food preferences discussed and will be honored as able within scope of diet restriction   NUTRITION DIAGNOSIS:   Swallowing difficulty related to dysphagia(limited tolerance of certain foods) as evidenced by per patient/family report(ST assessment- moderate orophaengeal dysphagia).   GOAL:  Provide needs based on ASPEN/SCCM guidelines   MONITOR:  Diet advancement, PO intake, Supplement acceptance, Weight trends, Labs   REASON FOR ASSESSMENT:   Malnutrition Screening Tool    ASSESSMENT: Patient is a 85 yo female presents with shortness of breath and swallow problem (multiple esophageal dilatations). History also significant for- HF, COPD, DM, HTN (uncontrolled) and HOH.   ST has assessed pt and diet advanced. Patient is difficult to communicate with due to her hearing deficit. She is reluctant to tell RD what she has been eating. Only says there are things she can't eat that she used to. She reports independence with food preparation. Her intake has been down per family member who is present.   Severe wt loss 10% (14 lb) x 2 months. Usual wt 56-60 kg since February of 2019 up until September this year.   Medications reviewed and include: Lipitor, Synthroid, Lopressor  And Protonix  Labs: BMP Latest Ref Rng & Units 03/25/2019 07/17/2018 03/05/2018  Glucose 70 - 99 mg/dL 110(H) 107(H) 138(H)  BUN 8 - 23 mg/dL 8 32(H) 20  Creatinine 0.44 - 1.00 mg/dL 0.87 1.11(H) 1.26(H)  Sodium 135 - 145 mmol/L 133(L) 136 138  Potassium 3.5 - 5.1 mmol/L 4.0 4.2 4.1  Chloride 98 - 111 mmol/L 100 105 106  CO2 22 - 32 mmol/L 22 23 24   Calcium 8.9 - 10.3 mg/dL 9.4 9.2 9.3    NUTRITION - FOCUSED PHYSICAL EXAM:  Findings are no fat depletion, severe clavicle, moderate dorsal muscle depletion, and no edema.    Diet Order:   Diet Order        DIET DYS 2 Room service appropriate? Yes; Fluid consistency: Thin  Diet effective now              EDUCATION NEEDS:   Education needs have been addressed Skin:  Skin Assessment: Reviewed RN Assessment  Last BM:  unknown  Height:   Ht Readings from Last 1 Encounters:  03/25/19 5\' 6"  (1.676 m)    Weight:   Wt Readings from Last 1 Encounters:  03/26/19 53.8 kg    Ideal Body Weight:  59 kg  BMI:  Body mass index is 19.14 kg/m.  Estimated Nutritional Needs:   Kcal:  1345-1506 (25-28 kcal/kg/bw)  Protein:  65-70 (1.2-1.3 gr/kg/bw)  Fluid:  >1300 ml daily   Colman Cater MS,RD,CSG,LDN Office: [phone] Pager: [phone]

## 2019-03-26 NOTE — Consult Note (Signed)
Referring Provider: Dr. Kennis Carina Primary Care Physician:  Charlene Morgan, MD Primary Gastroenterologist:  Dr. Jena [name]   Date of Admission: 03/25/2019 Date of Consultation: 03/26/2019  Reason for Consultation: Dysphagia   HPI:  [name] is a 85 y.o. year old female with history of dysphagia, Schatzki's ring s/p most recent dilation in 2017, presenting yesterday with shortness of breath and reports of dysphagia worsening over the past week. Seen most recently as outpatient at [facility] Sept 2020 and was noted to be a difficult historian. She had declined both EGD and BPE at time of appt. At that time, was not clear if dealing with true esophageal dysphagia. Patient then agreed to BPE as outpatient.  On Eliquis with history of afib, held at time of admission. Last dose on Saturday evening, confirmed by patient and daughter, [name]. Troponins elevated and peaked at 284 yesterday, nost recently 205 yesterday evening. Cardiology consult pending. CXR unrevealing. Speech eval pending as well.   I spoke with patient this morning, who states she has had intermittent SOB lately, using inhaler. Episodes of coughing, mostly non-productive but occasionally phlegm. Notes persistent sore throat, dry mouth. Has difficulty swallowing pills unless in applesauce. Points to throat and says it feels that food, liquids, medications don't go down the right way. Difficult historian. Denies abdominal pain. States constipation is chronic and takes an OTC agent as needed.    Spoke with daughter, [name], this morning for more information. She states patient's BP was elevated at home, medication not working, reporting SOB, so she took patient to the ED. Saturday: eating, got choked, and had to regurgitate food. Bread was involved but that is all she knows. Daughter states patient feels like she is getting strangled with solid foods, liquids, pills. No issues with soft foods. Appetite is always good. Patient panics  because she is afraid she will get choked.  Daughter states that coughing occurs primarily after eating. Has been on antibiotics for about a week due to intermittent coughing, prescribed by PCP (amoxicillin). Omeprazole daily as outpatient.   Last BPE in 2017. Age-related esophageal dysmotility noted on study, with narrowing at GE junction obstructing tablet. EGD then completed Dec 2017.     Past Medical History:  Diagnosis Date  . Anxiety   . Atrial fibrillation (HCC)   . COPD (chronic obstructive pulmonary disease) (HCC)   . Depression   . Hyperlipidemia   . Hypertension   . Hypothyroidism   . Mitral regurgitation    Mild  . Palpitations    PAT/EAT/NSVT, normal LVEF  . Type 2 diabetes mellitus (HCC)     Past Surgical History:  Procedure Laterality Date  . 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 hiatal hernia. Antral erosions status post gastric biopsy.  . ESOPHAGOGASTRODUODENOSCOPY N/A 05/12/2016   Dr. Jena [name]: moderate Schatzki ring s/p dilation. moderate sized hiatal hernia  . MALONEY DILATION N/A 04/17/2014   Procedure: [name] DILATION;  Surgeon: Corbin Ade, MD;  Location: [facility];  Service: Endoscopy;  Laterality: N/A;  [name] DILATION N/A 05/12/2016   Procedure: [name] DILATION;  Surgeon: Corbin Ade, MD;  Location: [facility];  Service: Endoscopy;  Laterality: N/A;  . none      Prior to Admission medications   Medication Sig Start Date End Date Taking? Authorizing Provider  albuterol (PROVENTIL HFA;VENTOLIN HFA) 108 (90 Base) MCG/ACT inhaler Inhale 1-2 puffs into the lungs every  4 (four) hours as needed for wheezing or shortness of breath.   Yes [provider]  ALPRAZolam (XANAX) 0.25 MG tablet Take 0.25 mg by mouth at bedtime as needed for anxiety.   Yes [provider]  amiodarone (PACERONE) 200 MG tablet TAKE 1/2 TABLET  BY MOUTH EVERY DAY 02/26/19  Yes Jonelle Sidle, MD  amoxicillin (AMOXIL) 500 MG capsule Take 500 mg by mouth 3 (three) times daily. 03/13/19  Yes [provider]  atorvastatin (LIPITOR) 40 MG tablet Take 40 mg by mouth daily at 6 PM. 09/27/11  Yes Jonelle Sidle, MD  COD LIVER OIL PO Take 15 mLs by mouth daily.   Yes [provider]  ELIQUIS 2.5 MG TABS tablet TAKE 1 TABLET BY MOUTH TWICE DAILY 03/08/19  Yes Jonelle Sidle, MD  furosemide (LASIX) 20 MG tablet TAKE 1 TABLET BY MOUTH EVERY DAY AS NEEDED FOR FLUID 06/23/16  Yes Jonelle Sidle, MD  levothyroxine (SYNTHROID, LEVOTHROID) 25 MCG tablet Take 25 mcg by mouth daily before breakfast.   Yes [provider]  lisinopril (PRINIVIL,ZESTRIL) 20 MG tablet TAKE ONE TABLET BY MOUTH TWICE DAILY 05/12/17  Yes Jonelle Sidle, MD  metoprolol succinate (TOPROL-XL) 50 MG 24 hr tablet TAKE ONE TABLET BY MOUTH EVERY MORNING immediately following a meal. 05/03/18  Yes Jonelle Sidle, MD  omeprazole (PRILOSEC) 20 MG capsule TAKE 1 CAPSULE BY MOUTH EVERY DAY 09/29/15  Yes Tiffany Kocher, PA-C  potassium chloride (MICRO-K) 10 MEQ CR capsule TAKE TWO CAPSULES BY MOUTH DAILY FOR low potassium 04/22/17  Yes [provider]  spironolactone (ALDACTONE) 25 MG tablet TAKE 1 TABLET BY MOUTH EVERY MORNING 10/02/18  Yes Jonelle Sidle, MD  umeclidinium bromide (INCRUSE ELLIPTA) 62.5 MCG/INH AEPB Inhale 1 puff into the lungs daily.   Yes [provider]  acetaminophen (TYLENOL) 650 MG CR tablet Take 650 mg by mouth as needed for pain.    [provider]  antiseptic oral rinse (BIOTENE) LIQD 15 mLs by Mouth Rinse route as needed for dry mouth.    [provider]  bisacodyl (DULCOLAX) 5 MG EC tablet Take 5 mg by mouth daily as needed for moderate constipation.    [provider]  bismuth subsalicylate (PEPTO BISMOL) 262 MG/15ML suspension Take 30 mLs by mouth daily as needed for  indigestion.    [provider]  cetirizine (ZYRTEC) 10 MG tablet Take 5-10 mg by mouth daily as needed for allergies or rhinitis.     [provider]  cloNIDine (CATAPRES) 0.1 MG tablet TAKE 1 TABLET BY MOUTH UP TO THREE TIMES DAILY IF BLOOD PRESSURE IS GREATER THAN 140/90 07/11/18   [provider]  EASY COMFORT LANCETS MISC TO test blood glucose ONCE daily 07/11/18   [provider]  guaiFENesin-dextromethorphan (ROBITUSSIN DM) 100-10 MG/5ML syrup Take 5 mLs by mouth every 4 (four) hours as needed for cough (SUGAR FREE).    [provider]  Lancet Devices (ADJUSTABLE LANCING DEVICE) MISC USE TO check blood glucose 04/24/18   [provider]  polyethylene glycol (MIRALAX / GLYCOLAX) packet Take 17 g 2 (two) times a week by mouth.    [provider]    Current Facility-Administered Medications  Medication Dose Route Frequency Provider Last Rate Last Dose  . acetaminophen (TYLENOL) tablet 650 mg  650 mg Oral Q6H PRN Emokpae, Ejiroghene E, MD       Or  . acetaminophen (TYLENOL) suppository 650 mg  650  mg Rectal Q6H PRN Emokpae, Ejiroghene E, MD      . albuterol (VENTOLIN HFA) 108 (90 Base) MCG/ACT inhaler 1-2 puff  1-2 puff Inhalation Q4H PRN Emokpae, Ejiroghene E, MD      . ALPRAZolam Prudy Feeler(XANAX) tablet 0.25 mg  0.25 mg Oral QHS Emokpae, Ejiroghene E, MD   0.25 mg at 03/25/19 2245  . amiodarone (PACERONE) tablet 100 mg  100 mg Oral Daily Emokpae, Ejiroghene E, MD      . atorvastatin (LIPITOR) tablet 40 mg  40 mg Oral q1800 Emokpae, Ejiroghene E, MD      . Chlorhexidine Gluconate Cloth 2 % PADS 6 each  6 each Topical Daily Emokpae, Ejiroghene E, MD      . cloNIDine (CATAPRES) tablet 0.2 mg  0.2 mg Oral BID Emokpae, Ejiroghene E, MD   0.2 mg at 03/25/19 2245  . feeding supplement (ENSURE ENLIVE) (ENSURE ENLIVE) liquid 237 mL  237 mL Oral BID BM Emokpae, Ejiroghene E, MD      . labetalol (NORMODYNE) injection 5 mg  5 mg Intravenous Q2H PRN  Emokpae, Ejiroghene E, MD      . levothyroxine (SYNTHROID) tablet 25 mcg  25 mcg Oral QAC breakfast Emokpae, Ejiroghene E, MD   Stopped at 03/26/19 0600  . lisinopril (ZESTRIL) tablet 20 mg  20 mg Oral BID Emokpae, Ejiroghene E, MD   20 mg at 03/25/19 2246  . metoprolol tartrate (LOPRESSOR) tablet 50 mg  50 mg Oral BID Emokpae, Ejiroghene E, MD   50 mg at 03/25/19 2246  . ondansetron (ZOFRAN) tablet 4 mg  4 mg Oral Q6H PRN Emokpae, Ejiroghene E, MD       Or  . ondansetron (ZOFRAN) injection 4 mg  4 mg Intravenous Q6H PRN Emokpae, Ejiroghene E, MD      . pantoprazole (PROTONIX) EC tablet 40 mg  40 mg Oral Daily Emokpae, Ejiroghene E, MD      . polyethylene glycol (MIRALAX / GLYCOLAX) packet 17 g  17 g Oral Daily PRN Emokpae, Ejiroghene E, MD      . spironolactone (ALDACTONE) tablet 25 mg  25 mg Oral q morning - 10a Emokpae, Ejiroghene E, MD      . umeclidinium bromide (INCRUSE ELLIPTA) 62.5 MCG/INH 1 puff  1 puff Inhalation Daily Emokpae, Ejiroghene E, MD        Allergies as of 03/25/2019 - Review Complete 03/25/2019  Allergen Reaction Noted  . Chocolate  03/10/2014    Family History  Problem Relation Age of Onset  . Heart failure Mother        Died in her 6870s  . Tuberculosis Father        Died at young age  . Cancer Brother   . Arthritis Other   . Colon cancer Other     Social History   Socioeconomic History  . Marital status: Married    Spouse name: Not on file  . Number of children: Not on file  . Years of education: 511  . Highest education level: Not on file  Occupational History  . Occupation: Retired    Associate Professormployer: RETIRED  Social Needs  . Financial resource strain: Not on file  . Food insecurity    Worry: Not on file    Inability: Not on file  . Transportation needs    Medical: Not on file    Non-medical: Not on file  Tobacco Use  . Smoking status: Never Smoker  . Smokeless tobacco: Never Used  Substance and Sexual  Activity  . Alcohol use: No    Alcohol/week:  0.0 standard drinks  . Drug use: No  . Sexual activity: Never  Lifestyle  . Physical activity    Days per week: Not on file    Minutes per session: Not on file  . Stress: Not on file  Relationships  . Social Herbalist on phone: Not on file    Gets together: Not on file    Attends religious service: Not on file    Active member of club or organization: Not on file    Attends meetings of clubs or organizations: Not on file    Relationship status: Not on file  . Intimate partner violence    Fear of current or ex partner: Not on file    Emotionally abused: Not on file    Physically abused: Not on file    Forced sexual activity: Not on file  Other Topics Concern  . Not on file  Social History Narrative  . Not on file    Review of Systems: Gen: see HPI CV: Denies chest pain, heart palpitations, syncope, edema  Resp: see HPI GI: see HPI GU : Denies urinary burning, urinary frequency, urinary incontinence.  MS: Denies joint pain,swelling, cramping Derm: Denies rash, itching, dry skin Psych: Denies depression, anxiety,confusion, or memory loss Heme: see HPI  Physical Exam: Vital signs in last 24 hours: Temp:  [97.9 F (36.6 C)-98.3 F (36.8 C)] 98.3 F (36.8 C) (11/02 0400) Pulse Rate:  [48-133] 49 (11/02 0400) Resp:  [13-27] 16 (11/02 0400) BP: (110-207)/(47-154) 110/54 (11/02 0400) SpO2:  [92 %-100 %] 100 % (11/02 0400) Weight:  [53.8 kg-59 kg] 53.8 kg (11/02 0500) Last BM Date: (Pt unsure) General:   Alert, thin but not cachectic-appearing. Pleasant.  Head:  Normocephalic and atraumatic. Eyes:  Sclera clear, no icterus. Ears:  Very hard of hearing Mouth:  Lips dry, cracked, oral mucosa red, dry, no obvious thrush Lungs:  Clear throughout to auscultation.  Heart:  S1 S2 present Abdomen:  Soft, nontender and nondistended. No masses, hepatosplenomegaly or hernias noted. Normal bowel sounds, without guarding, and without rebound.   Rectal:  Deferred  Msk:   Symmetrical without gross deformities. Normal posture. Extremities:  With edema. Neurologic:  Alert and  oriented x4 to person, hospital, unknown year or city but easily reoriented Psych:  Alert and cooperative. Normal mood and affect.  Intake/Output from previous day: 11/01 0701 - 11/02 0700 In: 120 [P.O.:120] Out: 750 [Urine:750] Intake/Output this shift: No intake/output data recorded.  Lab Results: Recent Labs    03/25/19 1323  WBC 4.1  HGB 12.3  HCT 38.7  PLT 201   BMET Recent Labs    03/25/19 1323  NA 133*  K 4.0  CL 100  CO2 22  GLUCOSE 110*  BUN 8  CREATININE 0.87  CALCIUM 9.4    Studies/Results: Dg Chest Port 1 View  Result Date: 03/25/2019 CLINICAL DATA:  Trouble swallowing. Pt also reports intermittent shortness of breath and congestion, chest pain, hypertension EXAM: PORTABLE CHEST 1 VIEW COMPARISON:  03/05/2018 FINDINGS: Cardiac silhouette borderline enlarged. No mediastinal or hilar masses. No evidence of adenopathy. Lungs are clear.  No pleural effusion or pneumothorax. Skeletal structures are grossly intact. IMPRESSION: No acute cardiopulmonary disease. Electronically Signed   By: Lajean Manes M.D.   On: 03/25/2019 14:42    Impression: Very pleasant 85 year old female with history of Schatzki's ring s/p dilation in both 2015 and 2017, presenting with  shortness of breath, uncontrolled HTN, elevated troponins, reports of choking and sore throat. Currently awaiting cardiology consultation.   Dysphagia: seen as outpatient most recently Sept 2020 and recommended BPE vs EGD. Difficult historian. I spoke with patient and daughter, who both describe more of an oropharyngeal dysphagia, with coughing spells after eating/drinking. Chronic sore throat. No obvious thrush on exam. Unable to rule out esophageal component as well, as she has a notable history of Schatzki's ring s/p dilation in 2015 and 2017, with improvement in dysphagia s/p dilation at those times.  Age-related esophageal dysmotility on BPE in 2017. Dysphagia multifactorial in this setting with dysmotility, likely oropharyngeal component, possible LPR, unable to exclude esophageal component.   Agree with Speech consultation and cardiology consult as already requested by hospitalist. If need for EGD, could pursue this thereafter. Eliquis would need to be on hold approximately 48 hours. Last dose Saturday evening.   Plan: Speech consultation as planned Cardiology consult PPI daily NPO except sips with meds until speech eval Continue to hold Eliquis Possible EGD/dilation in near future Discussed risks and benefits with patient and daughter for endoscopic intervention if necessary. Both are agreeable.  Updated daughter on plan Will continue to follow with you  Gelene Mink, PhD, ANP-BC [facility]     LOS: 0 days    03/26/2019, 8:00 AM

## 2019-03-26 NOTE — Discharge Summary (Signed)
Physician Discharge Summary  [name] WJX:[id number] DOB: 1927-02-19 DOA: 03/25/2019  PCP: Gareth MorganKnowlton, Steve, MD  Admit date: 03/25/2019 Discharge date: 03/26/2019  Time spent: 35 minutes  Recommendations for Outpatient Follow-up:  1. Repeat basic metabolic panel to follow electrolytes and renal function 2. Reassess blood pressure and further adjust antihypertensive regimen as needed   Discharge Diagnoses:  Active Problems:   Benign essential HTN   Chronic diastolic HF (heart failure) (HCC)   Dysphagia   Elevated troponin   Hypertensive emergency   Longstanding persistent atrial fibrillation (HCC)   Severe protein-calorie malnutrition (HCC)   Discharge Condition: Stable and improved.  Patient discharged home with instruction to follow-up with PCP, gastroenterology and cardiology service as an outpatient.  Diet recommendation: Dysphagia 2 diet; heart healthy/low-sodium.  Filed Weights   03/25/19 1011 03/25/19 2227 03/26/19 0500  Weight: 59 kg 53.8 kg 53.8 kg    History of present illness:  As per H&P written by Dr. Mariea ClontsEmokpae on 03/25/2019 85 y.o. female with medical history significant for hypertension, mitral regurgitation, LBBB, atrial fibrillation with RVR, diastolic CHF, COPD, diabetes mellitus.  Patient was brought to the ED with multiple complaints. Daughter is present at bedside and helps with some of the history, patient is hard of hearing but alert and oriented.  Over the past several months patient has had intermittent difficulty breathing, not present every day, presents in the mornings when she wakes up from bed or when about to eat.  But then resolves.  She denies difficulty breathing with activity.  They report intermittent nasal congestion's, occasional sore throat.  Reports symptoms improved after some courses of antibiotics prescribed as outpatient.  Daughter also reports some anxiety component that has improved with recent prescription of nightly Xanax.  No  fevers, no wheezing, no known contact with Covid positive patients.  At the time of my evaluation patient and daughter deny any difficulty breathing. Patient has had difficulty swallowing that has progressed.  History of esophageal dilatations.  She reports vomiting when she tried to eat ravioli yesterday.  Almost choking on hot dog about 5 weeks ago.  Patient's was supposed to have an outpatient barium pill esophagram done, initially patient declined.  Later she agreed to evaluation but this had to be postponed twice due to elevated blood pressures. No chest pain.  No lower extremity swelling, redness or pain.  ED Course: O2 sats greater than 99% on room air, heart rates 70s to 80s.  While in the waiting room, patient reported difficulty breathing, EKG was performed which showed heart rates in the 130s, ?  Atrial fibrillation by EDP, patient spontaneously converted back to sinus rhythm.  Troponin 41 >> 182.  Portable chest x-ray negative for acute abnormality, BNP mildly elevated 221. EDP talked to cardiologist on-call Dr. Elease HashimotoNahser, hospitalization was recommended, cardiology consult.  Hospital Course:  1-dysphagia -Appears to be secondary to oral pharyngeal dyssynchrony -Modified barium swallowing failed to demonstrate any obstructions -Following recommendations by speech therapy patient will be discharged on dysphagia 2 with thin liquids; strategies and instructions to minimize aspiration risk has been provided. -Outpatient follow-up with GI service.  2-uncontrolled hypertension -In the setting of missing some of her blood pressure medications will trying to attempt swallowing evaluation as an outpatient -We will resume antihypertensive regimen with adjustment of her metoprolol to 50 mg twice a day -Advised to follow heart healthy/low-sodium diet -Reassess blood pressure at follow-up visit and further adjust antihypertensive regimen as needed.  3-elevated troponin -Appears to be demand ischemia  in the setting of stringent episode of atrial fibrillation/tachycardia along with elevated blood pressure -Cardiology service has been consulted and no ischemic work-up recommended -Medication has been adjusted -Continue the use of metoprolol, spironolactone, lisinopril and clonidine  4-chronic diastolic heart failure -Appears stable and compensated -Continue as needed Lasix as previously instructed -Outpatient follow-up with cardiology service recommended.  5-chronic atrial fibrillation -Continue the use of metoprolol (or adjusted dose), amiodarone and Eliquis.  6-GERD -continue PPI  7-type 2 diabetes mellitus -Continue outpatient follow-up with PCP -Currently following diet control.  8-COPD -Appears stable currently without acute wheezing or shortness of breath. -Continue home inhaler regimen.  9-hyperlipidemia -Continue statins.  10-hypothyroidism -Continue Synthroid.  Procedures:  See below for x-ray reports.  Consultations:  Gastroenterology service  Cardiology service  Speech therapy  Discharge Exam: Vitals:   03/26/19 1600 03/26/19 1613  BP: (!) 151/48   Pulse: (!) 49   Resp: 13   Temp:  98 F (36.7 C)  SpO2: 100%     General: Afebrile, no chest pain, no shortness of breath, denies palpitation.  Patient would like to go home.  Chronically ill in appearance frail and underweight. Cardiovascular: Rate controlled, no rubs, no gallops, no JVD. Respiratory: Good air movement bilaterally, no wheezing, no crackles; normal respiratory effort.  Good O2 sat on room air Abdomen: Soft, nontender, distended, positive bowel sounds Extremities: No edema, no cyanosis or clubbing.  Discharge Instructions   Discharge Instructions    (HEART FAILURE PATIENTS) Call MD:  Anytime you have any of the following symptoms: 1) 3 pound weight gain in 24 hours or 5 pounds in 1 week 2) shortness of breath, with or without a dry hacking cough 3) swelling in the hands, feet or  stomach 4) if you have to sleep on extra pillows at night in order to breathe.   Complete by: As directed    Diet - low sodium heart healthy   Complete by: As directed    Discharge instructions   Complete by: As directed    Take medications as prescribed Follow low-sodium diet (less than 2.5 g on daily basis) Maintain adequate hydration Check weight on daily basis Regarding diet consistency as swallowing recommendations; Follow instructions below: Dysphagia 2 (Fine chop) solids;Thin liquid   Liquid Administration via: Cup;No straw   Medication Administration: Crushed with puree   Supervision: Patient able to self feed;Intermittent supervision to cue for compensatory strategies   Compensations: Small sips/bites;Multiple dry swallows after each bite/sip;Follow solids with liquid;Clear throat intermittently;Chin tuck(chin tuck with cup sips thin)   Postural Changes: Remain semi-upright after after feeds/meals (Comment);Seated upright at 90 degrees   Oral Care Recommendations: Oral care BID;Staff/trained caregiver to provide oral care   Other Recommendations: Clarify dietary restrictions     Allergies as of 03/26/2019      Reactions   Chocolate    Hives      Medication List    STOP taking these medications   amoxicillin 500 MG capsule Commonly known as: AMOXIL   bismuth subsalicylate 262 MG/15ML suspension Commonly known as: PEPTO BISMOL   metoprolol succinate 50 MG 24 hr tablet Commonly known as: TOPROL-XL     TAKE these medications   acetaminophen 650 MG CR tablet Commonly known as: TYLENOL Take 650 mg by mouth as needed for pain.   Adjustable Lancing Device Misc USE TO check blood glucose   albuterol 108 (90 Base) MCG/ACT inhaler Commonly known as: VENTOLIN HFA Inhale 1-2 puffs into the lungs every 4 (four) hours  as needed for wheezing or shortness of breath.   ALPRAZolam 0.25 MG tablet Commonly known as: XANAX Take 0.25 mg by mouth at bedtime as  needed for anxiety.   amiodarone 200 MG tablet Commonly known as: PACERONE TAKE 1/2 TABLET BY MOUTH EVERY DAY   antiseptic oral rinse Liqd 15 mLs by Mouth Rinse route as needed for dry mouth.   atorvastatin 40 MG tablet Commonly known as: LIPITOR Take 40 mg by mouth daily at 6 PM.   bisacodyl 5 MG EC tablet Commonly known as: DULCOLAX Take 5 mg by mouth daily as needed for moderate constipation.   cetirizine 10 MG tablet Commonly known as: ZYRTEC Take 5-10 mg by mouth daily as needed for allergies or rhinitis.   cloNIDine 0.1 MG tablet Commonly known as: CATAPRES TAKE 1 TABLET BY MOUTH UP TO THREE TIMES DAILY IF BLOOD PRESSURE IS GREATER THAN 140/90   COD LIVER OIL PO Take 15 mLs by mouth daily.   Easy Comfort Lancets Misc TO test blood glucose ONCE daily   Eliquis 2.5 MG Tabs tablet Generic drug: apixaban TAKE 1 TABLET BY MOUTH TWICE DAILY   feeding supplement (ENSURE ENLIVE) Liqd Take 237 mLs by mouth 2 (two) times daily between meals.   furosemide 20 MG tablet Commonly known as: LASIX TAKE 1 TABLET BY MOUTH EVERY DAY AS NEEDED FOR FLUID   guaiFENesin-dextromethorphan 100-10 MG/5ML syrup Commonly known as: ROBITUSSIN DM Take 5 mLs by mouth every 4 (four) hours as needed for cough (SUGAR FREE).   Incruse Ellipta 62.5 MCG/INH Aepb Generic drug: umeclidinium bromide Inhale 1 puff into the lungs daily.   levothyroxine 25 MCG tablet Commonly known as: SYNTHROID Take 25 mcg by mouth daily before breakfast.   lisinopril 20 MG tablet Commonly known as: ZESTRIL TAKE ONE TABLET BY MOUTH TWICE DAILY   metoprolol tartrate 50 MG tablet Commonly known as: LOPRESSOR Take 1 tablet (50 mg total) by mouth 2 (two) times daily.   omeprazole 20 MG capsule Commonly known as: PRILOSEC TAKE 1 CAPSULE BY MOUTH EVERY DAY   polyethylene glycol 17 g packet Commonly known as: MIRALAX / GLYCOLAX Take 17 g 2 (two) times a week by mouth.   potassium chloride 10 MEQ CR  capsule Commonly known as: MICRO-K TAKE TWO CAPSULES BY MOUTH DAILY FOR low potassium   spironolactone 25 MG tablet Commonly known as: ALDACTONE TAKE 1 TABLET BY MOUTH EVERY MORNING      Allergies  Allergen Reactions  . Chocolate     Hives   Follow-up Information    Gareth Morgan, MD Follow up in 10 day(s).   Specialty: Family Medicine Contact information: [address] [place] [address] [phone]        Jonelle Sidle, MD .   Specialty: Cardiology Contact information: [address] [place] [address] [phone]           The results of significant diagnostics from this hospitalization (including imaging, microbiology, ancillary and laboratory) are listed below for reference.    Significant Diagnostic Studies: Dg Chest Port 1 View  Result Date: 03/25/2019 CLINICAL DATA:  Trouble swallowing. Pt also reports intermittent shortness of breath and congestion, chest pain, hypertension EXAM: PORTABLE CHEST 1 VIEW COMPARISON:  03/05/2018 FINDINGS: Cardiac silhouette borderline enlarged. No mediastinal or hilar masses. No evidence of adenopathy. Lungs are clear.  No pleural effusion or pneumothorax. Skeletal structures are grossly intact. IMPRESSION: No acute cardiopulmonary disease. Electronically Signed   By: Amie Portland  M.D.   On: 03/25/2019 14:42   Dg Swallowing Func-speech Pathology  Result Date: 03/26/2019 Objective Swallowing Evaluation: Type of Study: MBS-Modified Barium Swallow Study  Patient Details Name: BREZLYN MANRIQUE MRN: [id number] Date of Birth: May 31, 1926 Today's Date: 03/26/2019 Time: SLP Start Time (ACUTE ONLY): 1430 -SLP Stop Time (ACUTE ONLY): 1452 SLP Time Calculation (min) (ACUTE ONLY): 22 min Past Medical History: Past Medical History: Diagnosis Date . Anxiety  . Atrial fibrillation (HCC)  . COPD (chronic obstructive pulmonary disease) (HCC)  . Depression  . Hyperlipidemia  . Hypertension  . Hypothyroidism  . Mitral  regurgitation   Mild . Palpitations   PAT/EAT/NSVT, normal LVEF . Type 2 diabetes mellitus (HCC)  Past Surgical History: Past Surgical History: Procedure Laterality Date . 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 hiatal hernia. Antral erosions status post gastric biopsy. . ESOPHAGOGASTRODUODENOSCOPY N/A 05/12/2016  Dr. Jena Gauss: moderate Schatzki ring s/p dilation. moderate sized hiatal hernia . MALONEY DILATION N/A 04/17/2014  Procedure: [name] DILATION;  Surgeon: Corbin Ade, MD;  [facility];  Service: Endoscopy;  Laterality: N/A; [name] DILATION N/A 05/12/2016  Procedure: [name] DILATION;  Surgeon: Corbin Ade, MD;  Location: [facility];  Service: Endoscopy;  Laterality: N/A; . none   HPI: Shontae Judie Petit Castagnola is a 85 y.o. year old female with history of dysphagia, Schatzki's ring s/p most recent dilation in 2017, presenting yesterday with shortness of breath and reports of dysphagia worsening over the past week. Seen most recently as outpatient at [facility] Sept 2020 and was noted to be a difficult historian. She had declined both EGD and BPE at time of appt. At that time, was not clear if dealing with true esophageal dysphagia. Patient then agreed to BPE as outpatient.  On Eliquis with history of afib, held at time of admission. Last dose on Saturday evening, confirmed by patient and daughter, [name]. Troponins elevated and peaked at 284 yesterday, nost recently 205 yesterday evening. Cardiology consult pending. CXR unrevealing. Speech eval pending as well.  Subjective: "I crush my pills." Assessment / Plan / Recommendation CHL IP CLINICAL IMPRESSIONS 03/26/2019 Clinical Impression Pt presents with moderate oropharyngeal dysphagia characterized by reduced lingual strength with prolonged and inefficient oral phase, reduced bolus cohesiveness, premature spillage, piecemeal deglutition, delay  in swallow trigger after liquids spill towards pyriforms, reduced tongue base retraction and epiglottic deflection (worse with puree and solids) resulting in vallecular and pyriform pooling after the swallow. Pt with trace penetration and aspiration of thins with cup and straw sips due to premature spillage, reduced laryngeal closure, and residuals pooling after the swallow which was variably sensed. Pt did cough after aspirating a small amount when taking straw sips thin, however it was not removed. Chin tuck was implemented with cup sips thin and found to be effective. Pt with significant vallecular residue with purees and solids which was only minimally decreased with thin liquid wash. NTL were given and noted to cause increased residuals, although were not aspirated during the study. Recommend D2 and thin liquids via cup sip with a chin tuck, po medications crushed as able in puree, take small bites/sips, alternate solids/liquids, oral care after meals, and Pt to sit upright for all eating/drinking and remain upright for at least 30 minutes after. SLP will follow during acute stay for Pt/family education and implementation of pharyngeal swallowing exercises as able. She would benefit from RD consult to assist with meal  selections given severity of dysphagia with solids.  SLP Visit Diagnosis Dysphagia, oropharyngeal phase (R13.12) Attention and concentration deficit following -- Frontal lobe and executive function deficit following -- Impact on safety and function Mild aspiration risk;Moderate aspiration risk   CHL IP TREATMENT RECOMMENDATION 03/26/2019 Treatment Recommendations Therapy as outlined in treatment plan below   Prognosis 03/26/2019 Prognosis for Safe Diet Advancement Fair Barriers to Reach Goals Severity of deficits Barriers/Prognosis Comment -- CHL IP DIET RECOMMENDATION 03/26/2019 SLP Diet Recommendations Dysphagia 2 (Fine chop) solids;Thin liquid Liquid Administration via Cup;No straw Medication  Administration Crushed with puree Compensations Small sips/bites;Multiple dry swallows after each bite/sip;Follow solids with liquid;Clear throat intermittently;Chin tuck Postural Changes Remain semi-upright after after feeds/meals (Comment);Seated upright at 90 degrees   CHL IP OTHER RECOMMENDATIONS 03/26/2019 Recommended Consults -- Oral Care Recommendations Oral care BID;Staff/trained caregiver to provide oral care Other Recommendations Clarify dietary restrictions   CHL IP FOLLOW UP RECOMMENDATIONS 03/26/2019 Follow up Recommendations Home health SLP   CHL IP FREQUENCY AND DURATION 03/26/2019 Speech Therapy Frequency (ACUTE ONLY) min 2x/week Treatment Duration 1 week      CHL IP ORAL PHASE 03/26/2019 Oral Phase Impaired Oral - Pudding Teaspoon -- Oral - Pudding Cup -- Oral - Honey Teaspoon -- Oral - Honey Cup -- Oral - Nectar Teaspoon -- Oral - Nectar Cup Reduced posterior propulsion;Lingual/palatal residue;Delayed oral transit;Decreased bolus cohesion;Premature spillage Oral - Nectar Straw -- Oral - Thin Teaspoon Reduced posterior propulsion;Lingual/palatal residue;Delayed oral transit;Decreased bolus cohesion;Premature spillage Oral - Thin Cup Reduced posterior propulsion;Lingual/palatal residue;Delayed oral transit;Decreased bolus cohesion;Premature spillage Oral - Thin Straw Premature spillage;Delayed oral transit;Lingual pumping Oral - Puree Weak lingual manipulation;Lingual pumping;Reduced posterior propulsion;Piecemeal swallowing;Delayed oral transit;Decreased bolus cohesion Oral - Mech Soft -- Oral - Regular Weak lingual manipulation;Lingual pumping;Reduced posterior propulsion;Lingual/palatal residue;Delayed oral transit;Decreased bolus cohesion Oral - Multi-Consistency -- Oral - Pill NT Oral Phase - Comment --  CHL IP PHARYNGEAL PHASE 03/26/2019 Pharyngeal Phase Impaired Pharyngeal- Pudding Teaspoon -- Pharyngeal -- Pharyngeal- Pudding Cup -- Pharyngeal -- Pharyngeal- Honey Teaspoon -- Pharyngeal --  Pharyngeal- Honey Cup -- Pharyngeal -- Pharyngeal- Nectar Teaspoon -- Pharyngeal -- Pharyngeal- Nectar Cup Delayed swallow initiation-pyriform sinuses;Reduced epiglottic inversion;Reduced tongue base retraction;Pharyngeal residue - valleculae;Pharyngeal residue - pyriform Pharyngeal -- Pharyngeal- Nectar Straw -- Pharyngeal -- Pharyngeal- Thin Teaspoon Delayed swallow initiation-vallecula;Pharyngeal residue - valleculae;Reduced tongue base retraction;Pharyngeal residue - pyriform;Delayed swallow initiation-pyriform sinuses Pharyngeal -- Pharyngeal- Thin Cup Delayed swallow initiation-pyriform sinuses;Reduced tongue base retraction;Penetration/Apiration after swallow;Pharyngeal residue - valleculae;Pharyngeal residue - pyriform;Compensatory strategies attempted (with notebox);Penetration/Aspiration before swallow Pharyngeal Material does not enter airway;Material enters airway, passes BELOW cords without attempt by patient to eject out (silent aspiration);Material enters airway, remains ABOVE vocal cords and not ejected out Pharyngeal- Thin Straw Delayed swallow initiation-pyriform sinuses;Penetration/Aspiration before swallow;Trace aspiration;Reduced epiglottic inversion;Reduced airway/laryngeal closure;Reduced tongue base retraction;Pharyngeal residue - valleculae;Pharyngeal residue - pyriform Pharyngeal Material enters airway, passes BELOW cords and not ejected out despite cough attempt by patient Pharyngeal- Puree Delayed swallow initiation-vallecula;Reduced pharyngeal peristalsis;Reduced epiglottic inversion;Reduced tongue base retraction;Pharyngeal residue - valleculae Pharyngeal -- Pharyngeal- Mechanical Soft -- Pharyngeal -- Pharyngeal- Regular Delayed swallow initiation-vallecula;Reduced pharyngeal peristalsis;Reduced epiglottic inversion;Reduced tongue base retraction;Pharyngeal residue - valleculae Pharyngeal -- Pharyngeal- Multi-consistency -- Pharyngeal -- Pharyngeal- Pill -- Pharyngeal -- Pharyngeal  Comment --  CHL IP CERVICAL ESOPHAGEAL PHASE 03/26/2019 Cervical Esophageal Phase WFL Pudding Teaspoon -- Pudding Cup -- Honey Teaspoon -- Honey Cup -- Nectar Teaspoon -- Nectar Cup -- Nectar Straw -- Thin Teaspoon -- Thin Cup -- Thin Straw -- Puree -- Mechanical Soft --  Regular -- Multi-consistency -- Pill -- Cervical Esophageal Comment -- Thank you, Havery Moros, CCC-SLP [phone] PORTER,DABNEY 03/26/2019, 3:54 PM               Microbiology: Recent Results (from the past 240 hour(s))  SARS CORONAVIRUS 2 (TAT 6-24 HRS) Nasopharyngeal Nasopharyngeal Swab     Status: None   Collection Time: 03/25/19  4:56 PM   Specimen: Nasopharyngeal Swab  Result Value Ref Range Status   SARS Coronavirus 2 NEGATIVE NEGATIVE Final    Comment: (NOTE) SARS-CoV-2 target nucleic acids are NOT DETECTED. The SARS-CoV-2 RNA is generally detectable in upper and lower respiratory specimens during the acute phase of infection. Negative results do not preclude SARS-CoV-2 infection, do not rule out co-infections with other pathogens, and should not be used as the sole basis for treatment or other patient management decisions. Negative results must be combined with clinical observations, patient history, and epidemiological information. The expected result is Negative. Fact Sheet for Patients: HairSlick.no Fact Sheet for Healthcare Providers: quierodirigir.com This test is not yet approved or cleared by the [place] FDA and  has been authorized for detection and/or diagnosis of SARS-CoV-2 by FDA under an Emergency Use Authorization (EUA). This EUA will remain  in effect (meaning this test can be used) for the duration of the COVID-19 declaration under Section 56 4(b)(1) of the Act, 21 U.S.C. section 360bbb-3(b)(1), unless the authorization is terminated or revoked sooner. Performed at [facility], [address], [place], [place] [address]   MRSA  PCR Screening     Status: None   Collection Time: 03/25/19 10:11 PM   Specimen: Nasal Mucosa; Nasopharyngeal  Result Value Ref Range Status   MRSA by PCR NEGATIVE NEGATIVE Final    Comment:        The GeneXpert MRSA Assay (FDA approved for NASAL specimens only), is one component of a comprehensive MRSA colonization surveillance program. It is not intended to diagnose MRSA infection nor to guide or monitor treatment for MRSA infections. Performed at [facility], [address] [place], [place] [address]      Labs: Basic Metabolic Panel: Recent Labs  Lab 03/25/19 1323  NA 133*  K 4.0  CL 100  CO2 22  GLUCOSE 110*  BUN 8  CREATININE 0.87  CALCIUM 9.4   CBC: Recent Labs  Lab 03/25/19 1323  WBC 4.1  NEUTROABS 3.1  HGB 12.3  HCT 38.7  MCV 92.6  PLT 201   BNP (last 3 results) Recent Labs    03/25/19 1323  BNP 221.0*    Signed:  Vassie Loll MD.  [facility] 03/26/2019, 4:29 PM

## 2019-03-28 ENCOUNTER — Telehealth: Payer: Self-pay | Admitting: Gastroenterology

## 2019-03-28 ENCOUNTER — Encounter: Payer: Self-pay | Admitting: Internal Medicine

## 2019-03-28 NOTE — Telephone Encounter (Signed)
[name], can we have patient follow-up with [place] in the next 2 weeks or so? Hospital follow-up.

## 2019-03-28 NOTE — Telephone Encounter (Signed)
PATIENT SCHEDULED  °

## 2019-03-29 ENCOUNTER — Telehealth: Payer: Self-pay | Admitting: Internal Medicine

## 2019-03-29 NOTE — Telephone Encounter (Signed)
Spoke with pt. Pt was concerned about using Listerine. Pt was advised not to use if she feels she isn't able to control not swallowing the Listerine. Pt states she isn't going to use it but wanted to call our office to ask.

## 2019-03-29 NOTE — Telephone Encounter (Signed)
[phone] PLEASE CALL PATIENT, HER DAUGHTER CALLED AND ASKED IF SHE COULD GARGLE WITH LISTERINE

## 2019-04-13 ENCOUNTER — Ambulatory Visit: Payer: Medicare Other | Admitting: Gastroenterology

## 2019-04-18 ENCOUNTER — Other Ambulatory Visit: Payer: Self-pay

## 2019-04-18 ENCOUNTER — Ambulatory Visit: Payer: Medicare Other | Admitting: Gastroenterology

## 2019-04-18 ENCOUNTER — Encounter: Payer: Self-pay | Admitting: Gastroenterology

## 2019-04-18 DIAGNOSIS — R1312 Dysphagia, oropharyngeal phase: Secondary | ICD-10-CM

## 2019-04-18 NOTE — Progress Notes (Signed)
Referring Provider: Gareth Morgan, MD Primary Care Physician:  Gareth Morgan, MD Primary GI: Dr. Jena Gauss  Chief Complaint  Patient presents with   Dysphagia    trouble with foods/liquids    HPI:   [name] is a 85 y.o. female presenting today with a history of dysphagia, Schatzki's ring s/p most recent dilation in 2017. Last BPE in 2017. Age-related esophageal dysmotility noted on study, with narrowing at GE junction obstructing tablet. EGD then completed Dec 2017. Recently inpatient with uncontrolled HTN. GI was consulted due to dysphagia. She was eager to go home and was discharged after short stay.   Speech eval completed while inpatient with MBSS: moderate oropharyngeal dysphagia characterized by reduced lingual strength with prolonged and inefficient oral phase, reduced bolus cohesiveness, premature spillage, piecemeal deglutition, delay in swallow trigger after liquids spill towards pyriforms, reduced tongue base retraction and epiglottic deflection (worse with puree and solids) resulting in vallecular and pyriform pooling after the swallow. Pt with trace penetration and aspiration of thins with cup and straw sips due to premature spillage, reduced laryngeal closure, and residuals pooling after the swallow which was variably sensed. Pt did cough after aspirating a small amount when taking straw sips thin, however it was not removed. Chin tuck was implemented with cup sips thin and found to be effective. Pt with significant vallecular residue with purees and solids which was only minimally decreased with thin liquid wash. NTL were given and noted to cause increased residuals, although were not aspirated during the study. Recommend D2 and thin liquids via cup sip with a chin tuck, po medications crushed as able in puree, take small bites/sips, alternate solids/liquids, oral care after meals, and Pt to sit upright for all eating/drinking and remain upright for at least 30 minutes  after   Eating baby food. Daughter present with her. States patient is scared to swallow at times. Will hold food in her mouth. Able to tolerate liquids. Drinks boost/ensure twice per day. Will cough with eating sometimes. Lays down to eat per daughter. Patient states she laid in bed to eat, then later stated she sat on edge of bed. No GERD symptoms. No abdominal pain. Weight loss noted of about 15 lbs since Feb 2020.   Past Medical History:  Diagnosis Date   Anxiety    Atrial fibrillation (HCC)    COPD (chronic obstructive pulmonary disease) (HCC)    Depression    Hyperlipidemia    Hypertension    Hypothyroidism    Mitral regurgitation    Mild   Palpitations    PAT/EAT/NSVT, normal LVEF   Type 2 diabetes mellitus (HCC)     Past Surgical History:  Procedure Laterality Date   ESOPHAGOGASTRODUODENOSCOPY N/A 04/17/2014   RMR: 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 hiatal hernia. Antral erosions status post gastric biopsy.   ESOPHAGOGASTRODUODENOSCOPY N/A 05/12/2016   Dr. Jena Gauss: moderate Schatzki ring s/p dilation. moderate sized hiatal hernia   MALONEY DILATION N/A 04/17/2014   Procedure: [name] DILATION;  Surgeon: Corbin Ade, MD;  Location: [facility];  Service: Endoscopy;  Laterality: N/A;   MALONEY DILATION N/A 05/12/2016   Procedure: [name] DILATION;  Surgeon: Corbin Ade, MD;  Location: [facility];  Service: Endoscopy;  Laterality: N/A;   none      Current Outpatient Medications  Medication Sig Dispense Refill   acetaminophen (TYLENOL) 650 MG CR tablet Take 650 mg by mouth as needed for pain.  albuterol (PROVENTIL HFA;VENTOLIN HFA) 108 (90 Base) MCG/ACT inhaler Inhale 1-2 puffs into the lungs every 4 (four) hours as needed for wheezing or shortness of breath.     ALPRAZolam (XANAX) 0.25 MG tablet Take 0.25 mg by mouth at bedtime as needed for anxiety.     amiodarone (PACERONE)  200 MG tablet TAKE 1/2 TABLET BY MOUTH EVERY DAY 45 tablet 1   antiseptic oral rinse (BIOTENE) LIQD 15 mLs by Mouth Rinse route as needed for dry mouth.     atorvastatin (LIPITOR) 40 MG tablet Take 40 mg by mouth daily at 6 PM.     bisacodyl (DULCOLAX) 5 MG EC tablet Take 5 mg by mouth daily as needed for moderate constipation.     cetirizine (ZYRTEC) 10 MG tablet Take 5-10 mg by mouth daily as needed for allergies or rhinitis.      cloNIDine (CATAPRES) 0.1 MG tablet TAKE 1 TABLET BY MOUTH UP TO THREE TIMES DAILY IF BLOOD PRESSURE IS GREATER THAN 140/90     COD LIVER OIL PO Take 15 mLs by mouth daily.     EASY COMFORT LANCETS MISC TO test blood glucose ONCE daily     ELIQUIS 2.5 MG TABS tablet TAKE 1 TABLET BY MOUTH TWICE DAILY 180 tablet 1   feeding supplement, ENSURE ENLIVE, (ENSURE ENLIVE) LIQD Take 237 mLs by mouth 2 (two) times daily between meals.     furosemide (LASIX) 20 MG tablet TAKE 1 TABLET BY MOUTH EVERY DAY AS NEEDED FOR FLUID 30 tablet 6   guaiFENesin-dextromethorphan (ROBITUSSIN DM) 100-10 MG/5ML syrup Take 5 mLs by mouth every 4 (four) hours as needed for cough (SUGAR FREE).     Lancet Devices (ADJUSTABLE LANCING DEVICE) MISC USE TO check blood glucose     levothyroxine (SYNTHROID, LEVOTHROID) 25 MCG tablet Take 25 mcg by mouth daily before breakfast.     lisinopril (PRINIVIL,ZESTRIL) 20 MG tablet TAKE ONE TABLET BY MOUTH TWICE DAILY 180 tablet 3   metoprolol tartrate (LOPRESSOR) 50 MG tablet Take 1 tablet (50 mg total) by mouth 2 (two) times daily. 60 tablet 1   omeprazole (PRILOSEC) 20 MG capsule TAKE 1 CAPSULE BY MOUTH EVERY DAY 30 capsule 5   polyethylene glycol (MIRALAX / GLYCOLAX) packet Take 17 g 2 (two) times a week by mouth.     potassium chloride (MICRO-K) 10 MEQ CR capsule TAKE TWO CAPSULES BY MOUTH DAILY FOR low potassium  5   spironolactone (ALDACTONE) 25 MG tablet TAKE 1 TABLET BY MOUTH EVERY MORNING 90 tablet 1   umeclidinium bromide (INCRUSE  ELLIPTA) 62.5 MCG/INH AEPB Inhale 1 puff into the lungs daily.     No current facility-administered medications for this visit.     Allergies as of 04/18/2019 - Review Complete 04/18/2019  Allergen Reaction Noted   Chocolate  03/10/2014    Family History  Problem Relation Age of Onset   Heart failure Mother        Died in her 6070s   Tuberculosis Father        Died at young age   Cancer Brother    Arthritis Other    Colon cancer Other     Social History   Socioeconomic History   Marital status: Married    Spouse name: Not on file   Number of children: Not on file   Years of education: 11   Highest education level: Not on file  Occupational History   Occupation: Retired    Associate Professormployer: RETIRED  Social Needs  Financial resource strain: Not on file   Food insecurity    Worry: Not on file    Inability: Not on file   Transportation needs    Medical: Not on file    Non-medical: Not on file  Tobacco Use   Smoking status: Never Smoker   Smokeless tobacco: Never Used  Substance and Sexual Activity   Alcohol use: No    Alcohol/week: 0.0 standard drinks   Drug use: No   Sexual activity: Never  Lifestyle   Physical activity    Days per week: Not on file    Minutes per session: Not on file   Stress: Not on file  Relationships   Social connections    Talks on phone: Not on file    Gets together: Not on file    Attends religious service: Not on file    Active member of club or organization: Not on file    Attends meetings of clubs or organizations: Not on file    Relationship status: Not on file  Other Topics Concern   Not on file  Social History Narrative   Not on file    Review of Systems: Gen: see HPI CV: Denies chest pain, palpitations, syncope, peripheral edema, and claudication. Resp: Denies dyspnea at rest, cough, wheezing, coughing up blood, and pleurisy. GI: see HPI Derm: Denies rash, itching, dry skin Psych: Denies depression,  anxiety, memory loss, confusion. No homicidal or suicidal ideation.  Heme: Denies bruising, bleeding, and enlarged lymph nodes.  Physical Exam: BP (!) 193/70    Pulse 71    Temp 97.8 F (36.6 C) (Oral)    Ht 5\' 6"  (1.676 m)    Wt 116 lb 12.8 oz (53 kg)    BMI 18.85 kg/m  General:   Alert and oriented. No distress noted. Pleasant and cooperative.  Head:  Normocephalic and atraumatic. Abdomen:  +BS, soft, non-tender and non-distended. No HSM or masses noted. Limited with patient sitting in chair.  Msk:  With kyphosis  Extremities:  Trace pretibial edema Neurologic:  Alert and  oriented x4 Psych:  Alert and cooperative. Normal mood and affect.  ASSESSMENT/PLAN: [name] is a 85 y.o. female presenting today with oropharyngeal dysphagia, previously hospitalized with uncontrolled hypertension and underwent MBSS by Speech due to dysphagia. Her symptoms are predominantly oropharyngeal. Although she has history of dilations in the past, EGD/dilation would likely not be of any benefit at this time in light of known age related dysmotility and oropharyngeal symptoms. Reports discongruent regarding whether she lays in bed while eating or sits up. I feel she would benefit greatly from Speech outpatient visits. We reviewed dysphagia 2 diet as recommended by Speech Pathologist while inpatient. Referral to Speech Pathology.    Annitta Needs, PhD, ANP-BC [facility]

## 2019-04-18 NOTE — Patient Instructions (Addendum)
We will be setting you up with speech therapy. I believe this will be helpful for you.    Your diet should be a Dysphagia level 2 (which is fine chopped solids), and thin liquids    Use a cup for drinking. Do not use straws. Crush medications and put with puree.   Make sure to take small sips/bites; Multiple dry swallows after each bite/sip; Follow solids with liquid; Clear throat intermittently; Chin tuck(chin tuck with cup sips thin)  Remain semi-upright at 90 degrees after after feeds/meals. It is very important to sit up while eating!  It was good to see you today!  I enjoyed seeing you again today! As you know, I value our relationship and want to provide genuine, compassionate, and quality care. I welcome your feedback. If you receive a survey regarding your visit,  I greatly appreciate you taking time to fill this out. See you next time!  Annitta Needs, PhD, ANP-BC [facility]       Dysphagia Eating Plan, Minced and Moist Foods This eating plan is for people with moderate swallowing problems who are transitioning from pureed to solid foods. Moist and minced foods are soft and cut into very small chunks so that they can be swallowed safely. On this eating plan, you may be instructed to drink liquids that are thickened. Work with your health care provider and your diet and nutrition specialist (dietitian) to make sure that you are following the diet safely and getting all the nutrients you need. What are tips for following this plan? General guidelines for foods   You may eat foods that are soft and moist.  Always test food texture before taking a bite. Poke food with a fork or spoon to make sure it is tender.  Take small bites. Each bite should be smaller than your little finger nail (about 4 mm by 4 mm).  If you were on a pureed food eating plan, you may still eat any of the foods included in that diet.  Avoid foods that are dry, hard, sticky, chewy,  coarse, or crunchy.  Avoid foods that separate into thin liquids and solids, such as cereal with milk or chunky soups.  Avoid liquids that have seeds or chunks.  If instructed by your health care provider, thicken liquids. Follow your health care provider's instructions about what products to use, how to do this, and to what thickness. ? You may use a commercial thickener, rice cereal, or potato flakes. ? Thickened liquids are usually a "pudding-like" consistency, or they may be as thick as honey or thick enough to eat with a spoon. Cooking  You may need to use a blender, whisk, or masher to soften some of your foods.  To moisten foods, you may add liquids while you are blending, mashing, or grinding your foods to the right consistency. These liquids include gravies, sauces, vegetable or fruit juice, milk, half and half, or water.  Reheat foods slowly to prevent a tough crust from forming. Meal planning  Eat a variety of foods in order to get all the nutrients you need.  Follow your meal plan as told by your health care provider or dietitian. What foods are allowed? Grains Soaked soft breads without nuts or seeds. Pancakes, sweet rolls, pastries, and [place] toast that have been moistened with syrup or sauce. Well-cooked pasta, noodles, rice, and bread dressing in very small pieces and thick sauce. Soft dumplings or spaetzle in very small pieces and butter or gravy. Soft-cooked  cereals. Vegetables Very soft, well-cooked vegetables in very small pieces. Soft-cooked, mashed potatoes. Thickened vegetable juice. Fruits Canned or cooked fruits that are soft or moist and do not have skin or seeds. Fresh, soft bananas. Thickened fruit juices. Meat and other protein foods Tender, moist, and finely minced or ground meats or poultry. Moist meatballs or meatloaf. Fish without bones. Scrambled, poached, or soft-cooked eggs. Tofu. Tempeh and meat alternatives in very small pieces. Well-cooked,  moistened and mashed beans, baked beans, peas, and other legumes. Dairy Thickened milk. Cream cheese. Yogurt. Cottage cheese. Sour cream. Fats and oils Butter. Margarine. Cream for cereal, depending on liquid consistency allowed. Gravy. Cream sauces. Mayonnaise. Sweets and desserts Pudding. Custard. Ice cream and sherbet. Whipped toppings. Soft, moist cakes. Icing. Jelly. Jams and preserves without seeds. Seasoning and other foods Sauces and salsas that have soft chunks that are smaller than 71mm. Salad dressings. Casseroles with small pieces of tender meat. All seasonings and sweeteners. Beverages Anything prepared at the thickness recommended by your dietitian. What foods are not allowed? Grains Breads that are hard or have nuts or seeds. Dry biscuits, pancakes, waffles, and bread dressing. Coarse cereals. Cereals that have nuts, seeds, dried fruits, or coconut. Sticky rice. Large pieces of pasta. Vegetables All raw vegetables. Tough, fibrous, chewy, or stringy cooked vegetables, such as celery, peas, broccoli, cabbage, Brussels sprouts, and asparagus. Potato skins. Potato and other vegetable chips. Fried or French-fried potatoes. Cooked corn and peas. Fruits Hard, crunchy, stringy, high-pulp, and juicy raw fruits such as apples, pineapple, papaya, and watermelon. Fruits with skins and seeds, such as grapes. Dried fruit and fruit leather. Meats and other protein foods Large pieces of meat. Dry, tough meats, such as bacon, sausage, and hot dogs. Chicken, [place], or fish with skin and bones. Crunchy peanut butter. Nuts. Seeds. Dairy Yogurt with nuts, seeds, or large chunks. Large chunks of cheese. Frozen desserts and milk consistency not allowed by your dietitian. Sweets and desserts Coarse, hard, chewy, or sticky desserts. Any dessert with nuts, seeds, coconut, pineapple, or dried fruit. Bread pudding. Seasoning and other foods Soups and casseroles with large chunks. Sandwiches.  Pizza. Summary  Moist and minced foods can be helpful for people with moderate swallowing problems.  On the dysphagia eating plan, you may eat foods that are soft, moist, and cut into pieces smaller than 63mm by 28mm.  You may be instructed to thicken liquids. Follow your health care provider's instructions about how to do this and to what consistency. This information is not intended to replace advice given to you by your health care provider. Make sure you discuss any questions you have with your health care provider. Document Released: 05/10/2005 Document Revised: 08/31/2018 Document Reviewed: 08/20/2016 Elsevier Patient Education  2020 [employer].

## 2019-04-18 NOTE — Progress Notes (Unsigned)
amb re 

## 2019-04-27 ENCOUNTER — Ambulatory Visit: Payer: Medicare Other | Admitting: Family Medicine

## 2019-04-27 ENCOUNTER — Other Ambulatory Visit: Payer: Self-pay

## 2019-04-27 ENCOUNTER — Encounter: Payer: Self-pay | Admitting: Family Medicine

## 2019-04-27 VITALS — BP 202/70 | HR 61 | Ht 66.0 in | Wt 116.0 lb

## 2019-04-27 DIAGNOSIS — I10 Essential (primary) hypertension: Secondary | ICD-10-CM | POA: Diagnosis not present

## 2019-04-27 DIAGNOSIS — I4811 Longstanding persistent atrial fibrillation: Secondary | ICD-10-CM

## 2019-04-27 DIAGNOSIS — I5032 Chronic diastolic (congestive) heart failure: Secondary | ICD-10-CM | POA: Diagnosis not present

## 2019-04-27 DIAGNOSIS — I340 Nonrheumatic mitral (valve) insufficiency: Secondary | ICD-10-CM

## 2019-04-27 DIAGNOSIS — E782 Mixed hyperlipidemia: Secondary | ICD-10-CM | POA: Diagnosis not present

## 2019-04-27 MED ORDER — HYDRALAZINE HCL 25 MG PO TABS: 25.0000 mg | ORAL_TABLET | Freq: Three times a day (TID) | ORAL | 1 refills | Status: DC

## 2019-04-27 MED ORDER — HYDRALAZINE HCL 10 MG PO TABS: 10.0000 mg | ORAL_TABLET | Freq: Four times a day (QID) | ORAL | 3 refills | Status: DC

## 2019-04-27 NOTE — Progress Notes (Signed)
Cardiology Office Note  Date: 04/27/2019   ID: [name], DOB 06-Jul-1926, MRN [id number]  PCP:  Lemmie Evens, MD  Cardiologist:  Rozann Lesches, MD Electrophysiologist:  None   Chief Complaint  Patient presents with   Follow-up    HTN, Diastolic HF, A. Fib    History of Present Illness: [name] is a 85 y.o. female last seen in September 2019 for routine follow-up visit.  Recent hospital admission on 03/25/2019 for multiple complaints including hypertensive urgency with systolic blood pressures from 180s to 200s.  Patient's daughter who was with her at the time stated the patient had been experiencing intermittent difficulty breathing.  She states the dyspnea was not present every day.  It usually occurrs when arising from bed in the morning or when she is about to eat but then resolves later.  She had a rapid heart rate in the 130s interpreted as atrial fibrillation by the emergency room physician.  Patient later spontaneously converted back to sinus rhythm.  Patient had increasing troponins with an initial troponin of 41 increasing up to 182 > 284> 205 .  Patient was seen by Dr. Johnsie Cancel who found no signs of acute coronary syndrome and no chest pain.  Given age there was no indication for stress testing.  Secondary to complaint of dyspnea there were no significant findings on chest x-ray and no acute distress.  BNP was only 221.  Patient had moderate MR by echo in 2016 with EF of 55%. He stated he would update echo.  He recommended considering adding hydralazine as needed per primary care team.  Daughter states patient complaining of dysphagia.  She has a history Schatzki's ring and esophageal dilatations.  She is scheduled soon to start with therapy for dysphagia.  Blood pressure significantly elevated today at 202/70.  She has consistently high blood uncontrolled blood pressure.  Patient lives by herself and has anxiety issues since the loss of her husband and  moving to a new environment.  She takes Xanax for anxiety.  Past Medical History:  Diagnosis Date   Anxiety    Atrial fibrillation (HCC)    COPD (chronic obstructive pulmonary disease) ([place])    Depression    Hyperlipidemia    Hypertension    Hypothyroidism    Mitral regurgitation    Mild   Palpitations    PAT/EAT/NSVT, normal LVEF   Type 2 diabetes mellitus ([place])     Past Surgical History:  Procedure Laterality Date   ESOPHAGOGASTRODUODENOSCOPY N/A 04/17/2014   RMR: 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 hiatal hernia. Antral erosions status post gastric biopsy.   ESOPHAGOGASTRODUODENOSCOPY N/A 05/12/2016   Dr. Gala Romney: moderate Schatzki ring s/p dilation. moderate sized hiatal hernia   MALONEY DILATION N/A 04/17/2014   Procedure: Venia Minks DILATION;  Surgeon: Daneil Dolin, MD;  [facility];  Service: Endoscopy;  Laterality: N/A;   MALONEY DILATION N/A 05/12/2016   Procedure: Venia Minks DILATION;  Surgeon: Daneil Dolin, MD;  [facility];  Service: Endoscopy;  Laterality: N/A;   none      Current Outpatient Medications  Medication Sig Dispense Refill   acetaminophen (TYLENOL) 650 MG CR tablet Take 650 mg by mouth as needed for pain.     albuterol (PROVENTIL HFA;VENTOLIN HFA) 108 (90 Base) MCG/ACT inhaler Inhale 1-2 puffs into the lungs every 4 (four) hours as needed for wheezing or shortness of breath.     ALPRAZolam (  XANAX) 0.25 MG tablet Take 0.25 mg by mouth at bedtime as needed for anxiety.     amiodarone (PACERONE) 200 MG tablet TAKE 1/2 TABLET BY MOUTH EVERY DAY 45 tablet 1   antiseptic oral rinse (BIOTENE) LIQD 15 mLs by Mouth Rinse route as needed for dry mouth.     atorvastatin (LIPITOR) 40 MG tablet Take 40 mg by mouth daily at 6 PM.     bisacodyl (DULCOLAX) 5 MG EC tablet Take 5 mg by mouth daily as needed for moderate constipation.     cetirizine (ZYRTEC) 10 MG  tablet Take 5-10 mg by mouth daily as needed for allergies or rhinitis.      cloNIDine (CATAPRES) 0.1 MG tablet TAKE 1 TABLET BY MOUTH UP TO THREE TIMES DAILY IF BLOOD PRESSURE IS GREATER THAN 140/90     COD LIVER OIL PO Take 15 mLs by mouth daily.     EASY COMFORT LANCETS MISC TO test blood glucose ONCE daily     ELIQUIS 2.5 MG TABS tablet TAKE 1 TABLET BY MOUTH TWICE DAILY 180 tablet 1   feeding supplement, ENSURE ENLIVE, (ENSURE ENLIVE) LIQD Take 237 mLs by mouth 2 (two) times daily between meals.     furosemide (LASIX) 20 MG tablet TAKE 1 TABLET BY MOUTH EVERY DAY AS NEEDED FOR FLUID 30 tablet 6   guaiFENesin-dextromethorphan (ROBITUSSIN DM) 100-10 MG/5ML syrup Take 5 mLs by mouth every 4 (four) hours as needed for cough (SUGAR FREE).     Lancet Devices (ADJUSTABLE LANCING DEVICE) MISC USE TO check blood glucose     levothyroxine (SYNTHROID, LEVOTHROID) 25 MCG tablet Take 25 mcg by mouth daily before breakfast.     lisinopril (PRINIVIL,ZESTRIL) 20 MG tablet TAKE ONE TABLET BY MOUTH TWICE DAILY 180 tablet 3   metoprolol tartrate (LOPRESSOR) 50 MG tablet Take 1 tablet (50 mg total) by mouth 2 (two) times daily. 60 tablet 1   omeprazole (PRILOSEC) 20 MG capsule TAKE 1 CAPSULE BY MOUTH EVERY DAY 30 capsule 5   polyethylene glycol (MIRALAX / GLYCOLAX) packet Take 17 g 2 (two) times a week by mouth.     potassium chloride (MICRO-K) 10 MEQ CR capsule TAKE TWO CAPSULES BY MOUTH DAILY FOR low potassium  5   spironolactone (ALDACTONE) 25 MG tablet TAKE 1 TABLET BY MOUTH EVERY MORNING 90 tablet 1   umeclidinium bromide (INCRUSE ELLIPTA) 62.5 MCG/INH AEPB Inhale 1 puff into the lungs daily.     hydrALAZINE (APRESOLINE) 25 MG tablet Take 1 tablet (25 mg total) by mouth 3 (three) times daily. 270 tablet 1   No current facility-administered medications for this visit.    Allergies:  Chocolate   Social History: The patient  reports that she has never smoked. She has never used smokeless  tobacco. She reports that she does not drink alcohol or use drugs.   Family History: The patient's family history includes Arthritis in an other family member; Cancer in her brother; Colon cancer in an other family member; Heart failure in her mother; Tuberculosis in her father.   ROS:  Please see the history of present illness. Otherwise, complete review of systems is positive for none.  All other systems are reviewed and negative.   Physical Exam: VS:  BP (!) 202/70    Pulse 61    Ht  (1.676 m)    Wt 116 lb (52.6 kg)    SpO2 100%    BMI 18.72 kg/m , BMI Body mass index is 18.72 kg/m.  Wt Readings from Last 3 Encounters:  04/27/19 116 lb (52.6 kg)  04/18/19 116 lb 12.8 oz (53 kg)  03/26/19 118 lb 9.7 oz (53.8 kg)    General: Patient appears comfortable at rest. HEENT: Conjunctiva and lids normal Neck: Supple, no elevated JVP or carotid bruits, no thyromegaly. Lungs: Clear to auscultation, nonlabored breathing at rest. Cardiac: Regular rate and rhythm, no S3 or significant systolic murmur, no pericardial rub. Extremities: No pitting edema, distal pulses 2+. Skin: Warm and dry. Musculoskeletal: No kyphosis. Neuropsychiatric: Alert and oriented x3, affect grossly appropriate.  ECG:  An ECG dated March 25, 2019 was personally reviewed today and demonstrated:  Normal sinus rhythm left bundle branch block rate of 77  Recent Labwork: 07/17/2018: ALT 17; AST 21 03/25/2019: B Natriuretic Peptide 221.0; BUN 8; Creatinine, Ser 0.87; Hemoglobin 12.3; Platelets 201; Potassium 4.0; Sodium 133     Component Value Date/Time   CHOL 142 07/14/2014 0456   TRIG 32 07/14/2014 0456   HDL 58 07/14/2014 0456   CHOLHDL 2.4 07/14/2014 0456   VLDL 6 07/14/2014 0456   LDLCALC 78 07/14/2014 0456    Other Studies Reviewed Today:   Echocardiogram in February 2016 Study Conclusions   - Left ventricle: The cavity size was normal. Systolic function was  normal. The estimated ejection fraction  was in the range of 50%  to 55%. Wall motion was normal; there were no regional wall  motion abnormalities. There was a reduced contribution of atrial  contraction to ventricular filling, due to increased ventricular  diastolic pressure or atrial contractile dysfunction. Doppler  parameters are consistent with a reversible restrictive pattern,  indicative of decreased left ventricular diastolic compliance  and/or increased left atrial pressure (grade 3 diastolic  dysfunction).  - Ventricular septum: Septal motion showed paradox. These changes  are consistent with intraventricular conduction delay.  - Aortic valve: Trileaflet; normal thickness, mildly calcified  leaflets.  - Mitral valve: There was moderate regurgitation.  - Left atrium: The atrium was mildly to moderately dilated.  - Tricuspid valve: There was moderate regurgitation.  - Pulmonary arteries: PA peak pressure: 34 mm Hg (S).   Assessment and Plan:  1. Essential hypertension   2. Longstanding persistent atrial fibrillation (HCC)   3. Chronic diastolic HF (heart failure) (HCC)   4. Mixed hyperlipidemia   5. Nonrheumatic mitral valve regurgitation    1. Essential hypertension Blood pressure is significantly elevated.  Chronically uncontrolled with current antihypertensive regimen.  Start hydralazine 25 mg 3 times daily.  Start checking blood pressures at least 3 times per week.  Come back for nursing visit in 2 weeks and have a blood pressure check. Continue lisinopril 20 mg p.o. twice daily, metoprolol 50 mg p.o. twice daily, spironolactone 25 mg 3 times daily and clonidine up to 3 times a day if blood pressure at or greater than 140/90.  2. Longstanding persistent atrial fibrillation (HCC) Heart rate today is regular with a rate of 61.  Recent EKG November 1 showed sinus rhythm with left bundle branch block rate of 77.  Patient states she can feel occasional palpitations.  Continue amiodarone and  Eliquis.  3. Chronic diastolic HF (heart failure) (HCC) Echo in 2016 demonstrated grade 3 diastolic dysfunction.  Get repeat echo  4. Mixed hyperlipidemia Continue atorvastatin 40 mg daily. Last LDL 66   5. Nonrheumatic mitral valve regurgitation Moderate regurgitation on echo in 2016. Repeat echo as mentioned above.   Medication Adjustments/Labs and Tests Ordered: Current medicines are reviewed at length  with the patient today.  Concerns regarding medicines are outlined above.    Patient Instructions  Medication Instructions:   Your physician has recommended you make the following change in your medication:   Start hydralazine 25 mg by mouth three times daily  Continue other medications the same  Labwork:  NONE  Testing/Procedures: Your physician has requested that you have an echocardiogram. Echocardiography is a painless test that uses sound waves to create images of your heart. It provides your doctor with information about the size and shape of your heart and how well your hearts chambers and valves are working. This procedure takes approximately one hour. There are no restrictions for this procedure.  Follow-Up:  Your physician recommends that you schedule a follow-up appointment in: 2 months.  Your physician recommends that you schedule a follow-up appointment in: 2 weeks for a nurse visit to have your blood pressure checked.    Any Other Special Instructions Will Be Listed Below (If Applicable).  Please check your blood pressure at home at least 3 times per week  If you need a refill on your cardiac medications before your next appointment, please call your pharmacy.        Signed, Rennis Harding, NP 04/27/2019 1:26 PM    [facility] at [facility] [address] [place], [place], [place] [address] Phone: [phone]; Fax: [phone]

## 2019-04-27 NOTE — Patient Instructions (Addendum)
Medication Instructions:   Your physician has recommended you make the following change in your medication:   Start hydralazine 25 mg by mouth three times daily  Continue other medications the same  Labwork:  NONE  Testing/Procedures: Your physician has requested that you have an echocardiogram. Echocardiography is a painless test that uses sound waves to create images of your heart. It provides your doctor with information about the size and shape of your heart and how well your heart's chambers and valves are working. This procedure takes approximately one hour. There are no restrictions for this procedure.  Follow-Up:  Your physician recommends that you schedule a follow-up appointment in: 2 months.  Your physician recommends that you schedule a follow-up appointment in: 2 weeks for a nurse visit to have your blood pressure checked.    Any Other Special Instructions Will Be Listed Below (If Applicable).  Please check your blood pressure at home at least 3 times per week  If you need a refill on your cardiac medications before your next appointment, please call your pharmacy.

## 2019-04-29 ENCOUNTER — Other Ambulatory Visit: Payer: Self-pay | Admitting: Cardiology

## 2019-04-30 ENCOUNTER — Other Ambulatory Visit: Payer: Self-pay | Admitting: Cardiology

## 2019-04-30 ENCOUNTER — Telehealth: Payer: Self-pay | Admitting: Cardiology

## 2019-04-30 DIAGNOSIS — I10 Essential (primary) hypertension: Secondary | ICD-10-CM

## 2019-04-30 NOTE — Telephone Encounter (Signed)
Having problems with her hydrALAZINE (APRESOLINE) 25 MG tablet She has been experiencing symptoms with this and daughter wants to speak with nurse   165/68

## 2019-04-30 NOTE — Telephone Encounter (Signed)
Given new medication - started on Friday - Hydralazine 25mg  three times per day.  Stated that every time she takes this medication - makes her feel weak & dizzy.  BP running 129 - 140's.   30 min ago - feels dizzy & week - 166   Not sure that is may be anxiety - told her to take her mid day medication  Feeling week & dizzy.    Rechecking BP - daughter calling back with updated bp reading - 166/68.  Once daily medications she typically take at 8 am - BID is 8am & 8pm - for TID - mid day dose is a little after lunch time.

## 2019-04-30 NOTE — Telephone Encounter (Signed)
Tell her to stop mid day dose and decrease BID dose to 1/2 pill twice a day and see how she tolerates. Thank You

## 2019-05-01 ENCOUNTER — Other Ambulatory Visit: Payer: Self-pay

## 2019-05-01 ENCOUNTER — Ambulatory Visit: Payer: Medicare Other | Admitting: Cardiology

## 2019-05-01 ENCOUNTER — Ambulatory Visit (HOSPITAL_COMMUNITY): Payer: Medicare Other | Attending: Gastroenterology | Admitting: Speech Pathology

## 2019-05-01 ENCOUNTER — Encounter (HOSPITAL_COMMUNITY): Payer: Self-pay | Admitting: Speech Pathology

## 2019-05-01 DIAGNOSIS — R1312 Dysphagia, oropharyngeal phase: Secondary | ICD-10-CM | POA: Diagnosis present

## 2019-05-01 MED ORDER — HYDRALAZINE HCL 25 MG PO TABS: 12.5000 mg | ORAL_TABLET | Freq: Two times a day (BID) | ORAL | Status: DC

## 2019-05-01 NOTE — Telephone Encounter (Signed)
Daughter Lelon Frohlich) notified & verbalized understanding.

## 2019-05-01 NOTE — Therapy (Signed)
[place], [place], [address] Phone: [phone]   Fax:  [phone]  Speech Language Pathology Evaluation  Patient Details  Name: [name] MRN: [id number] Date of Birth: 26-Oct-1926 No data recorded  Encounter Date: 05/01/2019  End of Session - 05/01/19 1646    Visit Number  1    Number of Visits  1    Authorization Type  [employer] Medicare    SLP Start Time  5277    SLP Stop Time   1615    SLP Time Calculation (min)  50 min    Activity Tolerance  Patient tolerated treatment well       Past Medical History:  Diagnosis Date  . Anxiety   . Atrial fibrillation ([place])   . COPD (chronic obstructive pulmonary disease) ([place])   . Depression   . Hyperlipidemia   . Hypertension   . Hypothyroidism   . Mitral regurgitation    Mild  . Palpitations    PAT/EAT/NSVT, normal LVEF  . Type 2 diabetes mellitus ([place])     Past Surgical History:  Procedure Laterality Date  . 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 hiatal hernia. Antral erosions status post gastric biopsy.  . ESOPHAGOGASTRODUODENOSCOPY N/A 05/12/2016   Dr. Gala Romney: moderate Schatzki ring s/p dilation. moderate sized hiatal hernia  . MALONEY DILATION N/A 04/17/2014   Procedure: Venia Minks DILATION;  Surgeon: Daneil Dolin, MD;  [facility];  Service: Endoscopy;  Laterality: N/A;  Venia Minks DILATION N/A 05/12/2016   Procedure: Venia Minks DILATION;  Surgeon: Daneil Dolin, MD;  [facility];  Service: Endoscopy;  Laterality: N/A;  . none      There were no vitals filed for this visit.  MBSS from November 2020: Pt presents with moderate oropharyngeal dysphagia characterized by reduced lingual strength with prolonged and inefficient oral phase, reduced bolus cohesiveness, premature spillage, piecemeal deglutition, delay in swallow  trigger after liquids spill towards pyriforms, reduced tongue base retraction and epiglottic deflection (worse with puree and solids) resulting in vallecular and pyriform pooling after the swallow. Pt with trace penetration and aspiration of thins with cup and straw sips due to premature spillage, reduced laryngeal closure, and residuals pooling after the swallow which was variably sensed. Pt did cough after aspirating a small amount when taking straw sips thin, however it was not removed. Chin tuck was implemented with cup sips thin and found to be effective. Pt with significant vallecular residue with purees and solids which was only minimally decreased with thin liquid wash. NTL were given and noted to cause increased residuals, although were not aspirated during the study. Recommend D2 and thin liquids via cup sip with a chin tuck, po medications crushed as able in puree, take small bites/sips, alternate solids/liquids, oral care after meals, and Pt to sit upright for all eating/drinking and remain upright for at least 30 minutes after. SLP will follow during acute stay for Pt/family education and implementation of pharyngeal swallowing exercises as able. She would benefit from RD consult to assist with meal selections given severity of dysphagia with solids.   Subjective Assessment - 05/01/19 1642      Symptoms/Limitations   Subjective  "Sometimes the food won't go down, but it was better this morning at breakfast."    Patient is accompained by:  Family member      Pain Assessment  Currently in Pain?  No/denies      Prior Functional Status - 05/01/19 1643      Prior Functional Status   Cognitive/Linguistic Baseline  Baseline deficits    Baseline deficit details  mild cognitive deficits per family     Lives With  Alone    Available Help at Discharge  Family    Vocation  Retired      General - 05/01/19 1643      General Information   Date of Onset  04/18/19    HPI  [name] is a 85  yo female who was referred by Lewie Loron, NP for dysphagia evaluation and treatment. [name] is known to this SLP from MBSS completed during acute stay on 03/26/19 with recommendation for D2 and thin liquids with a chin tuck, po medications crushed as able in puree. Pt had trace aspiration of thins on two occasions (one with a straw and one from a cup sip) and NTL were noted to increase pharyngeal residuals. Chin tuck with thins was found to be effective in eliminating aspiration.    Type of Study  Bedside Swallow Evaluation    Previous Swallow Assessment  MBSS 03/26/2019 D2/thin    Diet Prior to this Study  Dysphagia 2 (chopped);Thin liquids    Temperature Spikes Noted  No    Respiratory Status  Room air    History of Recent Intubation  No    Behavior/Cognition  Alert;Cooperative;Pleasant mood    Oral Cavity Assessment  Within Functional Limits    Oral Care Completed by SLP  No    Oral Cavity - Dentition  Adequate natural dentition    Vision  Functional for self-feeding    Self-Feeding Abilities  Able to feed self    Patient Positioning  Upright in chair    Baseline Vocal Quality  Normal    Volitional Cough  Strong    Volitional Swallow  Able to elicit       Oral Motor/Sensory Function - 05/01/19 1644      Oral Motor/Sensory Function   Overall Oral Motor/Sensory Function  Within functional limits      Ice Chips - 05/01/19 1644      Ice Chips   Ice chips  Not tested      Thin Liquid - 05/01/19 1644      Thin Liquid   Thin Liquid  Impaired    Presentation  Cup    Pharyngeal  Phase Impairments  Suspected delayed Swallow;Cough - Delayed    Other Comments  Pt trained to implement chin tuck with thins and she was able to carryout with written cue card reminder        Puree - 05/01/19 1645      Puree   Puree  Within functional limits    Presentation  Spoon;Self Fed      Solid - 05/01/19 1645      Solid   Solid  Impaired    Presentation  Self Fed    Oral Phase  Functional Implications  Prolonged oral transit        Plan - 05/01/19 1647    Clinical Impression Statement  Clinical swallow evaluation completed in room with Pt sitting upright in chair and daughter present. The Pt reports an improvement in ability to swallow solid foods, but does indicate occasional coughing with thins and trouble with solids "going down". Oral motor examination completed and found to be WNL. The MBSS from acute care was reviewed with the Pt  and her daughter and questions answered. Pt exhibited some oral phase deficits consistent in Pt's with cognitive deficits (prolonged oral phase with munching pattern) and daughter indicates some memory decline in Pt.  SLP reviewed strategies and recommendations for food choices (soft foods) and provided in written format. Pt had not been implementing chin tuck with thins at home, so she was reeducated on this today with good follow through with min cues and written reminders. Recommend D3/mech soft and to finely chop meats (she reports getting "choked" on fried fish and hot dogs). SLP suggested Pt switch to balogna or finely chop hot dogs. Pt currently stays at home alone, however SLP suggested that family provided supervision during meals for Pt and they are working on this. Pt is unlikely to remember to chop meats well independently. While she does have some pharyngeal weakness, she is not an ideal candidate for swallowing exercises given her cognitive component. She would most benefit from diet texture modification and implementation of chin tuck with sips of liquids (she was able to follow written cue on the index card SLP provided). Pt and daughter were appreciative of information provided and questions answered to their satisfaction. They were also given my contact information should they have further questions. No further SLP services indicated at this time.    Treatment/Interventions  Aspiration precaution training;SLP instruction and  feedback;Compensatory strategies;Patient/family education    Potential Considerations  Ability to learn/carryover information    Consulted and Agree with Plan of Care  Patient;Family member/caregiver       Patient will benefit from skilled therapeutic intervention in order to improve the following deficits and impairments:   Dysphagia, oropharyngeal    Problem List Patient Active Problem List   Diagnosis Date Noted  . Dysphagia, oropharyngeal 04/18/2019  . Elevated troponin   . Hypertensive emergency   . Longstanding persistent atrial fibrillation (HCC)   . Severe protein-calorie malnutrition (HCC)   . Dysphagia 01/31/2019  . Throat tightness 12/14/2018  . Throat dryness 12/14/2018  . Dry mouth, unspecified 12/14/2018  . Iron deficiency anemia 05/12/2017  . GERD (gastroesophageal reflux disease) 03/17/2016  . Constipation 10/29/2015  . Gastric erosions 02/03/2015  . Sinus bradycardia 09/26/2014  . Dyspnea on exertion 09/18/2014  . Chronic diastolic HF (heart failure) (HCC) 09/18/2014  . Dizziness 09/18/2014  . Atrial flutter with rapid ventricular response (HCC)   . Atrial fibrillation with RVR (HCC) 09/09/2014  . Atrial flutter (HCC) 07/13/2014  . Hyponatremia 07/13/2014  . Diabetes (HCC) 07/13/2014  . Hypothyroidism 07/13/2014  . Tachycardia 07/13/2014  . Dysphagia, pharyngoesophageal phase 04/16/2014  . Neurogenic pain of lower extremity 03/15/2012  . Left bundle branch block 03/02/2011  . RENAL INSUFFICIENCY 05/22/2010  . Benign essential HTN 10/09/2009  . Mitral regurgitation 10/09/2009  . HYPERLIPIDEMIA-MIXED 02/12/2009  . Palpitations 02/12/2009   Thank you,  Havery MorosDabney Rexton Greulich, CCC-SLP [phone]  [facility] Center[facility],Audry Pecina 05/01/2019, 4:51 PM  [facility] [address] [place], [place], [address] Phone: [phone]   Fax:  [phone]  Name: [name] MRN: [id number] Date of Birth: 07-Sep-1926

## 2019-05-07 ENCOUNTER — Telehealth: Payer: Self-pay | Admitting: *Deleted

## 2019-05-07 NOTE — Telephone Encounter (Signed)
Pt daughter made aware - says she is going to have pt take hydralazine 12.5 mg every other day - says she doesn't want her to stop it since her BP has been so high in the past - aware of normal ranges of DBP 60-90 and that 40s/50s are low and to continue to monitor - updated medication list

## 2019-05-07 NOTE — Telephone Encounter (Signed)
If she has only been getting hydralazine 12.5 mg once daily, I wonder whether she needs to be on it at all with those reported blood pressures.  Consider holding and just keeping an eye on blood pressure at this point.

## 2019-05-07 NOTE — Telephone Encounter (Signed)
Pt daughter says BP has been 118/50, 118/40 HR 60s - daughter says she has been taking hydralazine 12.5 mg daily instead of bid - denies any symptoms at this time - was concerned about DBP being to low

## 2019-05-10 ENCOUNTER — Ambulatory Visit: Payer: Medicare Other

## 2019-05-22 ENCOUNTER — Other Ambulatory Visit: Payer: Medicare Other

## 2019-06-27 ENCOUNTER — Other Ambulatory Visit: Payer: Medicare Other

## 2019-06-28 ENCOUNTER — Ambulatory Visit: Payer: Medicare Other | Admitting: Cardiology

## 2019-08-26 ENCOUNTER — Other Ambulatory Visit: Payer: Self-pay | Admitting: Cardiology

## 2019-09-05 ENCOUNTER — Encounter: Payer: Self-pay | Admitting: Cardiology

## 2019-09-24 ENCOUNTER — Other Ambulatory Visit: Payer: Self-pay | Admitting: Cardiology

## 2019-09-26 ENCOUNTER — Other Ambulatory Visit: Payer: Medicare Other

## 2019-09-27 ENCOUNTER — Other Ambulatory Visit: Payer: Self-pay

## 2019-09-27 ENCOUNTER — Ambulatory Visit (INDEPENDENT_AMBULATORY_CARE_PROVIDER_SITE_OTHER): Payer: Medicare Other

## 2019-09-27 DIAGNOSIS — I5032 Chronic diastolic (congestive) heart failure: Secondary | ICD-10-CM | POA: Diagnosis not present

## 2019-09-27 DIAGNOSIS — I340 Nonrheumatic mitral (valve) insufficiency: Secondary | ICD-10-CM | POA: Diagnosis not present

## 2019-10-01 ENCOUNTER — Encounter: Payer: Self-pay | Admitting: Cardiology

## 2019-10-01 ENCOUNTER — Other Ambulatory Visit: Payer: Self-pay | Admitting: Cardiology

## 2019-10-01 NOTE — Progress Notes (Signed)
Cardiology Office Note  Date: 10/02/2019   ID: [name], DOB June 02, 1926, MRN [id number]  PCP:  Charlene Morgan, MD  Cardiologist:  Charlene Dell, MD Electrophysiologist:  None   Chief Complaint  Patient presents with  . Cardiac follow-up    History of Present Illness: [name] is a 86 y.o. female last seen in December 2020 by Mr. Charlene Hews NP.  She presents with her daughter today.  She does not report any progressive shortness of breath with typical activities, still does her own cooking.  She has had some difficulty with swallowing but had a speech therapy evaluation recently and has been doing better.  She has lost about 5 pounds.  Recent follow-up echocardiogram shows LVEF 50 to 55% with moderate diastolic dysfunction, progressive valvular disease including severe mitral regurgitation and moderate to severe tricuspid regurgitation, also associated severe pulmonary hypertension with RVSP 85 mmHg.  Results are progressive and we are managing this conservatively at this point.  She would not be a good candidate for invasive cardiac evaluation and/or surgery.  I reviewed her medications which are outlined below and stable.  We went over home blood pressure checks which actually look good in terms of trend overall systolics in the 120s to 140s.  She does not describe any palpitations and has had no syncope.  Past Medical History:  Diagnosis Date  . Anxiety   . Atrial fibrillation (HCC)   . COPD (chronic obstructive pulmonary disease) (HCC)   . Depression   . Essential hypertension   . Hyperlipidemia   . Hypothyroidism   . Mitral regurgitation   . Palpitations    PAT/EAT/NSVT, normal LVEF  . Type 2 diabetes mellitus (HCC)     Past Surgical History:  Procedure Laterality Date  . 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 hiatal hernia. Antral erosions status  post gastric biopsy.  . ESOPHAGOGASTRODUODENOSCOPY N/A 05/12/2016   Dr. Jena Gauss: moderate Schatzki ring s/p dilation. moderate sized hiatal hernia  . MALONEY DILATION N/A 04/17/2014   Procedure: [name] DILATION;  Surgeon: Corbin Ade, MD;  Location: [facility];  Service: Endoscopy;  Laterality: N/A;  [name] DILATION N/A 05/12/2016   Procedure: [name] DILATION;  Surgeon: Corbin Ade, MD;  Location: [facility];  Service: Endoscopy;  Laterality: N/A;  . none      Current Outpatient Medications  Medication Sig Dispense Refill  . acetaminophen (TYLENOL) 650 MG CR tablet Take 650 mg by mouth as needed for pain.    Marland Kitchen albuterol (PROVENTIL HFA;VENTOLIN HFA) 108 (90 Base) MCG/ACT inhaler Inhale 1-2 puffs into the lungs every 4 (four) hours as needed for wheezing or shortness of breath.    . ALPRAZolam (XANAX) 0.25 MG tablet Take 0.25 mg by mouth at bedtime as needed for anxiety.    Marland Kitchen amiodarone (PACERONE) 200 MG tablet TAKE 1/2 TABLET BY MOUTH EVERY DAY 45 tablet 1  . antiseptic oral rinse (BIOTENE) LIQD 15 mLs by Mouth Rinse route as needed for dry mouth.    Marland Kitchen atorvastatin (LIPITOR) 40 MG tablet Take 40 mg by mouth daily at 6 PM.    . bisacodyl (DULCOLAX) 5 MG EC tablet Take 5 mg by mouth daily as needed for moderate constipation.    . cetirizine (ZYRTEC) 10 MG tablet Take 5-10 mg by mouth daily as needed for allergies or rhinitis.     . cloNIDine (CATAPRES) 0.1 MG tablet  TAKE 1 TABLET BY MOUTH UP TO THREE TIMES DAILY IF BLOOD PRESSURE IS GREATER THAN 140/90    . COD LIVER OIL PO Take 15 mLs by mouth daily.    Marland Kitchen EASY COMFORT LANCETS MISC TO test blood glucose ONCE daily    . ELIQUIS 2.5 MG TABS tablet TAKE 1 TABLET BY MOUTH TWICE DAILY 180 tablet 1  . feeding supplement, ENSURE ENLIVE, (ENSURE ENLIVE) LIQD Take 237 mLs by mouth 2 (two) times daily between meals.    . furosemide (LASIX) 20 MG tablet TAKE 1 TABLET BY MOUTH EVERY DAY AS NEEDED FOR FLUID 30 tablet 6  .  guaiFENesin-dextromethorphan (ROBITUSSIN DM) 100-10 MG/5ML syrup Take 5 mLs by mouth every 4 (four) hours as needed for cough (SUGAR FREE).    . hydrALAZINE (APRESOLINE) 25 MG tablet Take 0.5 tablets (12.5 mg total) by mouth 2 (two) times daily. (Patient taking differently: Take 12.5 mg by mouth daily. )    . Lancet Devices (ADJUSTABLE LANCING DEVICE) MISC USE TO check blood glucose    . levothyroxine (SYNTHROID, LEVOTHROID) 25 MCG tablet Take 25 mcg by mouth daily before breakfast.    . lisinopril (PRINIVIL,ZESTRIL) 20 MG tablet TAKE ONE TABLET BY MOUTH TWICE DAILY 180 tablet 3  . metoprolol tartrate (LOPRESSOR) 50 MG tablet TAKE 1 TABLET BY MOUTH TWICE DAILY 60 tablet 6  . omeprazole (PRILOSEC) 20 MG capsule TAKE 1 CAPSULE BY MOUTH EVERY DAY 30 capsule 5  . polyethylene glycol (MIRALAX / GLYCOLAX) packet Take 17 g 2 (two) times a week by mouth.    . potassium chloride (MICRO-K) 10 MEQ CR capsule TAKE TWO CAPSULES BY MOUTH DAILY FOR low potassium  5  . spironolactone (ALDACTONE) 25 MG tablet TAKE 1 TABLET BY MOUTH EVERY MORNING 90 tablet 2  . umeclidinium bromide (INCRUSE ELLIPTA) 62.5 MCG/INH AEPB Inhale 1 puff into the lungs daily.     No current facility-administered medications for this visit.   Allergies:  Chocolate   ROS:   No orthopnea, no syncope.  Physical Exam: VS:  BP 140/82   Pulse 64   Ht 5\' 6"  (1.676 m)   Wt 109 lb 9.6 oz (49.7 kg)   SpO2 98%   BMI 17.69 kg/m , BMI Body mass index is 17.69 kg/m.  Wt Readings from Last 3 Encounters:  10/02/19 109 lb 9.6 oz (49.7 kg)  04/27/19 116 lb (52.6 kg)  04/18/19 116 lb 12.8 oz (53 kg)    General: Elderly woman, appears comfortable at rest. HEENT: Conjunctiva and lids normal, wearing a mask. Neck: Supple, no elevated JVP or carotid bruits, no thyromegaly. Lungs: Decreased breath sounds without wheezing, nonlabored breathing at rest. Cardiac: Regular rate and rhythm, no S3, apical systolic murmur, no pericardial  rub. Extremities: Chronic appearing lower leg, distal pulses 2+.  ECG:  An ECG dated 03/25/2019 was personally reviewed today and demonstrated:  Sinus rhythm with left bundle branch block.  Recent Labwork: 03/25/2019: B Natriuretic Peptide 221.0; BUN 8; Creatinine, Ser 0.87; Hemoglobin 12.3; Platelets 201; Potassium 4.0; Sodium 133   Other Studies Reviewed Today:  Echocardiogram 09/27/2019: 1. Left ventricular ejection fraction, by estimation, is 50 to 55%. The  left ventricle has low normal function. The left ventricle has no regional  wall motion abnormalities. Left ventricular diastolic parameters are  consistent with Grade II diastolic  dysfunction (pseudonormalization). Elevated left atrial pressure.  2. Right ventricular systolic function is low normal. The right  ventricular size is mildly enlarged. There is severely elevated pulmonary  artery systolic pressure.  3. Left atrial size was severely dilated.  4. Right atrial size was mildly dilated.  5. The mitral valve is degenerative. Severe mitral valve regurgitation.  6. The tricuspid valve is degenerative. Tricuspid valve regurgitation is  moderate to severe.  7. The aortic valve is tricuspid. Aortic valve regurgitation is mild.  Mild aortic valve sclerosis is present, with no evidence of aortic valve  stenosis.  8. The inferior vena cava is dilated in size with >50% respiratory  variability, suggesting right atrial pressure of 8 mmHg.  Assessment and Plan:  1.  Paroxysmal atrial fibrillation, CHA2DS2-VASc score is at least 6.  She does not report any active palpitations and has done well on low-dose amiodarone and Lopressor.  Continue Eliquis for stroke prophylaxis.  No spontaneous bleeding problems reported.  2.  Progressive valvular heart disease with severe mitral regurgitation and moderate to severe tricuspid regurgitation as well as severe pulmonary hypertension.  Continue medical therapy which includes lisinopril,  Aldactone, and Lasix.  She is not a good candidate for invasive cardiac evaluation and/or surgery.  3.  Essential hypertension, blood pressure trend reasonable based on review of home checks.  No changes were made in current regimen.  Medication Adjustments/Labs and Tests Ordered: Current medicines are reviewed at length with the patient today.  Concerns regarding medicines are outlined above.   Tests Ordered: No orders of the defined types were placed in this encounter.   Medication Changes: No orders of the defined types were placed in this encounter.   Disposition:  Follow up 6 months in the [place] office.  Signed, Jonelle Sidle, MD, [facility] 10/02/2019 2:31 PM     [facility] [address] [place], [place], [place] [address] Phone: [phone]; Fax: [phone]

## 2019-10-02 ENCOUNTER — Ambulatory Visit: Payer: Medicare Other | Admitting: Cardiology

## 2019-10-02 ENCOUNTER — Encounter: Payer: Self-pay | Admitting: Cardiology

## 2019-10-02 ENCOUNTER — Other Ambulatory Visit: Payer: Self-pay

## 2019-10-02 VITALS — BP 140/82 | HR 64 | Ht 66.0 in | Wt 109.6 lb

## 2019-10-02 DIAGNOSIS — I5032 Chronic diastolic (congestive) heart failure: Secondary | ICD-10-CM | POA: Diagnosis not present

## 2019-10-02 DIAGNOSIS — I38 Endocarditis, valve unspecified: Secondary | ICD-10-CM | POA: Diagnosis not present

## 2019-10-02 DIAGNOSIS — I480 Paroxysmal atrial fibrillation: Secondary | ICD-10-CM | POA: Diagnosis not present

## 2019-10-02 NOTE — Patient Instructions (Addendum)
Medication Instructions:    Your physician recommends that you continue on your current medications as directed. Please refer to the Current Medication list given to you today.  Labwork:  NONE  Testing/Procedures:  NONE  Follow-Up:  Your physician recommends that you schedule a follow-up appointment in: 6 months (office)  Any Other Special Instructions Will Be Listed Below (If Applicable).  If you need a refill on your cardiac medications before your next appointment, please call your pharmacy. 

## 2019-11-19 ENCOUNTER — Ambulatory Visit: Payer: Medicare Other | Admitting: Internal Medicine

## 2019-11-19 ENCOUNTER — Other Ambulatory Visit (HOSPITAL_COMMUNITY)
Admission: RE | Admit: 2019-11-19 | Discharge: 2019-11-19 | Disposition: A | Discharge status: Home / Self Care | Payer: Medicare Other | Source: Ambulatory Visit | Attending: Internal Medicine | Admitting: Internal Medicine

## 2019-11-19 ENCOUNTER — Other Ambulatory Visit: Payer: Self-pay

## 2019-11-19 ENCOUNTER — Ambulatory Visit (HOSPITAL_COMMUNITY)
Admission: RE | Admit: 2019-11-19 | Discharge: 2019-11-19 | Disposition: A | Discharge status: Home / Self Care | Payer: Medicare Other | Source: Ambulatory Visit | Attending: Internal Medicine | Admitting: Internal Medicine

## 2019-11-19 ENCOUNTER — Encounter: Payer: Self-pay | Admitting: Internal Medicine

## 2019-11-19 DIAGNOSIS — R0600 Dyspnea, unspecified: Secondary | ICD-10-CM | POA: Diagnosis not present

## 2019-11-19 DIAGNOSIS — I10 Essential (primary) hypertension: Secondary | ICD-10-CM | POA: Diagnosis not present

## 2019-11-19 DIAGNOSIS — R0609 Other forms of dyspnea: Secondary | ICD-10-CM

## 2019-11-19 DIAGNOSIS — R058 Other specified cough: Secondary | ICD-10-CM

## 2019-11-19 DIAGNOSIS — R05 Cough: Secondary | ICD-10-CM | POA: Insufficient documentation

## 2019-11-19 DIAGNOSIS — R1312 Dysphagia, oropharyngeal phase: Secondary | ICD-10-CM

## 2019-11-19 LAB — CBC WITH DIFFERENTIAL/PLATELET
Abs Immature Granulocytes: 0.01 10*3/uL (ref 0.00–0.07)
Basophils Absolute: 0 10*3/uL (ref 0.0–0.1)
Basophils Relative: 1 %
Eosinophils Absolute: 0.1 10*3/uL (ref 0.0–0.5)
Eosinophils Relative: 4 %
HCT: 34 % — ABNORMAL LOW (ref 36.0–46.0)
Hemoglobin: 10.5 g/dL — ABNORMAL LOW (ref 12.0–15.0)
Immature Granulocytes: 0 %
Lymphocytes Relative: 29 %
Lymphs Abs: 0.9 10*3/uL (ref 0.7–4.0)
MCH: 29.3 pg (ref 26.0–34.0)
MCHC: 30.9 g/dL (ref 30.0–36.0)
MCV: 95 fL (ref 80.0–100.0)
Monocytes Absolute: 0.2 10*3/uL (ref 0.1–1.0)
Monocytes Relative: 6 %
Neutro Abs: 1.9 10*3/uL (ref 1.7–7.7)
Neutrophils Relative %: 60 %
Platelets: 223 10*3/uL (ref 150–400)
RBC: 3.58 MIL/uL — ABNORMAL LOW (ref 3.87–5.11)
RDW: 14.6 % (ref 11.5–15.5)
WBC: 3.1 10*3/uL — ABNORMAL LOW (ref 4.0–10.5)
nRBC: 0 % (ref 0.0–0.2)

## 2019-11-19 LAB — HEMOGLOBIN A1C
Hgb A1c MFr Bld: 5.5 % (ref 4.8–5.6)
Mean Plasma Glucose: 111.15 mg/dL

## 2019-11-19 LAB — SEDIMENTATION RATE: Sed Rate: 23 mm/hr — ABNORMAL HIGH (ref 0–22)

## 2019-11-19 LAB — BASIC METABOLIC PANEL
Anion gap: 10 (ref 5–15)
BUN: 27 mg/dL — ABNORMAL HIGH (ref 8–23)
CO2: 25 mmol/L (ref 22–32)
Calcium: 9.4 mg/dL (ref 8.9–10.3)
Chloride: 103 mmol/L (ref 98–111)
Creatinine, Ser: 1.1 mg/dL — ABNORMAL HIGH (ref 0.44–1.00)
GFR calc Af Amer: 51 mL/min — ABNORMAL LOW (ref >60)
GFR calc non Af Amer: 44 mL/min — ABNORMAL LOW (ref >60)
Glucose, Bld: 111 mg/dL — ABNORMAL HIGH (ref 70–99)
Potassium: 5.3 mmol/L — ABNORMAL HIGH (ref 3.5–5.1)
Sodium: 138 mmol/L (ref 135–145)

## 2019-11-19 LAB — TSH: TSH: 2.478 u[IU]/mL (ref 0.350–4.500)

## 2019-11-19 LAB — BRAIN NATRIURETIC PEPTIDE: B Natriuretic Peptide: 233 pg/mL — ABNORMAL HIGH (ref 0.0–100.0)

## 2019-11-19 MED ORDER — IRBESARTAN 150 MG PO TABS: 150.0000 mg | ORAL_TABLET | Freq: Every day | ORAL | 11 refills | Status: DC

## 2019-11-19 NOTE — Patient Instructions (Addendum)
Stop incruse and lisinopril   Avoidance  Of chocolate, peppermint, colas, red wine, and acidic juices such as orange juice.  NO MINT OR MENTHOL PRODUCTS SO NO COUGH DROPS  USE SUGARLESS CANDY INSTEAD (Jolley ranchers or Stover's or Life Savers) or even ice chips will also do - the key is to swallow to prevent all throat clearing. NO OIL BASED VITAMINS - use powdered substitutes.  Avoid fish oil(cod liver oil) when coughing.    Only use your albuterol as a rescue medication to be used if you can't catch your breath by resting or doing a relaxed purse lip breathing pattern.  - The less you use it, the better it will work when you need it. - Ok to use up to 2 puffs  every 4 hours if you must but call for immediate appointment if use goes up over your usual need - Don't leave home without it !!  (think of it like the spare tire for your car)    Take avapro 150 mg one daily instead of lisinopril  - break it in half if too strong   Please remember to go to the  x-ray and lab department at [facility] for your tests - we will call you with the results when they are available      Please schedule a follow up office visit in 6 weeks, call sooner if needed with all medications /inhalers/ solutions in hand so we can verify exactly what you are taking. This includes all medications from all doctors and over the counters   .

## 2019-11-19 NOTE — Assessment & Plan Note (Addendum)
Onset around 2017 - d/c acei and incruse  11/19/2019   Upper airway cough syndrome (previously labeled PNDS),  is so named because it's frequently impossible to sort out how much is  CR/sinusitis with freq throat clearing (which can be related to primary GERD)   vs  causing  secondary (" extra esophageal")  GERD from wide swings in gastric pressure that occur with throat clearing, often  promoting self use of mint and menthol lozenges that reduce the lower esophageal sphincter tone and exacerbate the problem further in a cyclical fashion.   These are the same pts (now being labeled as having "irritable larynx syndrome" by some cough centers) who not infrequently have a history of having failed to tolerate ace inhibitors (like lisinopril) ,  dry powder inhalers (like Incurse)  or biphosphonates or report having atypical/extraesophageal reflux symptoms that don't respond to standard doses of PPI  and are easily confused as having aecopd or asthma flares by even experienced allergists/ pulmonologists (myself included).   >>> try off acei/ dpi and on gerd diet then regroup in 6 weeks

## 2019-11-19 NOTE — Assessment & Plan Note (Signed)
No evidence for significant anemia/ thryroid dz/ renal dz or amiodarone toxicity based on initial w/u

## 2019-11-19 NOTE — Assessment & Plan Note (Addendum)
D/C ACEi and K  11/19/2019 > replace with Avapro 150 mg one daily   In the best review of chronic cough to date ( NEJM 2016 375 [id number]) ,  ACEi are now felt to cause cough in up to  20% of pts which is a 4 fold increase from previous reports and does not include the variety of non-specific complaints we see in pulmonary clinic in pts on ACEi but previously attributed to another dx like  Copd/asthma and  include PNDS, throat and chest congestion, "bronchitis", unexplained dyspnea and noct "strangling" sensations, and hoarseness, but also  atypical /refractory GERD symptoms like dysphagia and "bad heartburn"   The only way I know  to prove this is not an "ACEi Case" is a trial off ACEi x a minimum of 6 weeks then regroup.   Try off acei and K/  avapro 150 mg daily x 6 weeks then regroup with ov       Medical decision making was a  High  level of complexity in this case because of  Three chronic conditions /diagnoses requiring extra time for  H and P, chart review, counseling,   and generating customized AVS unique to this new pt office visit and same day charting.   Each maintenance medication was reviewed in detail including emphasizing most importantly the difference between maintenance and prns and under what circumstances the prns are to be triggered using an action plan format where appropriate. Please see avs for details which were reviewed in writing by both me and my nurse and patient given a written copy highlighted where appropriate with yellow highlighter for the patient's continued care at home along with an updated version of their medications.  Patient was asked to maintain medication reconciliation by comparing this list to the actual medications being used at home and to contact this office right away if there is a conflict or discrepancy.

## 2019-11-19 NOTE — Assessment & Plan Note (Signed)
See st note 04/13/19 recs Dysphagia 2 (Fine chop) solids;Thin liquid   Liquid Administration via Cup;No straw  Medication Administration Crushed with puree  Compensations Small sips/bites;Multiple dry swallows after each bite/sip;Follow solids with liquid;Clear throat intermittently;Chin tuck  Postural Changes Remain semi-upright after after feeds/meals (Comment);Seated upright at 90 degrees    Reviewed diet/ not clear to me she's really following it.  Hoping some of her throat symptoms are due to meds (see uacs)

## 2019-11-19 NOTE — Progress Notes (Signed)
[name], female    DOB: Aug 29, 1926,   MRN: [id number]   Brief patient profile:  50 yobf never smoker with doe assoc with dysphagia x around 2017 prev f/b [name] self referred for cough > sob.     History of Present Illness  11/19/2019  Pulmonary/ 1st office eval/Kerin Kren  On incruse and acei and cough drops "by the mouthful"  Chief Complaint  Patient presents with  . Pulmonary Consult    Self referral. Former patient of Dr [name]. She c/o cough with white sputum. States breathing is overall doing well. She notices SOB most first thing in the am.   Dyspnea: after breakfast gets much worse assoc with sense of choking   Cough: sporadic min productive  Sleep: 30 degrees / has cough and sob spells like choking/ something stuck in throat  SABA use:  Lots of cough drops  Very confused with details of care and is in charge of her own "bag of pills" per daughter who is concerned she's on way too much meds  No obvious day to day or daytime variability or assoc  purulent sputum or mucus plugs or hemoptysis or cp or chest tightness, subjective wheeze or overt sinus or hb symptoms.    Also denies any obvious fluctuation of symptoms with weather or environmental changes or other aggravating or alleviating factors except as outlined above   No unusual exposure hx or h/o childhood pna/ asthma or knowledge of premature birth.  Current Allergies, Complete Past Medical History, Past Surgical History, Family History, and Social History were reviewed in [employer] record.  ROS  The following are not active complaints unless bolded Hoarseness, sore throat, dysphagia= globus sensation , dental problems, itching, sneezing,  nasal congestion or discharge of excess mucus or purulent secretions, ear ache,   fever, chills, sweats, unintended wt loss or wt gain, classically pleuritic or exertional cp,  orthopnea pnd or arm/hand swelling  or leg swelling, presyncope, palpitations,  abdominal pain, anorexia, nausea, vomiting, diarrhea  or change in bowel habits or change in bladder habits, change in stools or change in urine, dysuria, hematuria,  rash, arthralgias, visual complaints, headache, numbness, weakness or ataxia or problems with walking or coordination,  change in mood or  memory.            Past Medical History:  Diagnosis Date  . Anxiety   . Atrial fibrillation (HCC)   . COPD (chronic obstructive pulmonary disease) (HCC)   . Depression   . Essential hypertension   . Hyperlipidemia   . Hypothyroidism   . Mitral regurgitation   . Palpitations    PAT/EAT/NSVT, normal LVEF  . Type 2 diabetes mellitus (HCC)     Outpatient Medications Prior to Visit  Medication Sig Dispense Refill  . acetaminophen (TYLENOL) 650 MG CR tablet Take 650 mg by mouth as needed for pain.    Marland Kitchen albuterol (PROVENTIL HFA;VENTOLIN HFA) 108 (90 Base) MCG/ACT inhaler Inhale 1-2 puffs into the lungs every 4 (four) hours as needed for wheezing or shortness of breath.    . ALPRAZolam (XANAX) 0.25 MG tablet Take 0.25 mg by mouth at bedtime as needed for anxiety.    Marland Kitchen amiodarone (PACERONE) 200 MG tablet TAKE 1/2 TABLET BY MOUTH EVERY DAY 45 tablet 1  . antiseptic oral rinse (BIOTENE) LIQD 15 mLs by Mouth Rinse route as needed for dry mouth.    Marland Kitchen atorvastatin (LIPITOR) 40 MG tablet Take 40 mg by mouth daily at 6 PM.    .  bisacodyl (DULCOLAX) 5 MG EC tablet Take 5 mg by mouth daily as needed for moderate constipation.    . cetirizine (ZYRTEC) 10 MG tablet Take 5-10 mg by mouth daily as needed for allergies or rhinitis.     . cloNIDine (CATAPRES) 0.1 MG tablet TAKE 1 TABLET BY MOUTH UP TO THREE TIMES DAILY IF BLOOD PRESSURE IS GREATER THAN 140/90    . COD LIVER OIL PO Take 15 mLs by mouth daily.    Marland Kitchen dextromethorphan 15 MG/5ML syrup Take 10 mLs by mouth 4 (four) times daily as needed for cough.    Marland Kitchen EASY COMFORT LANCETS MISC TO test blood glucose ONCE daily    . ELIQUIS 2.5 MG TABS tablet TAKE  1 TABLET BY MOUTH TWICE DAILY 180 tablet 1  . feeding supplement, ENSURE ENLIVE, (ENSURE ENLIVE) LIQD Take 237 mLs by mouth 2 (two) times daily between meals.    . furosemide (LASIX) 20 MG tablet TAKE 1 TABLET BY MOUTH EVERY DAY AS NEEDED FOR FLUID 30 tablet 6  . guaiFENesin-dextromethorphan (ROBITUSSIN DM) 100-10 MG/5ML syrup Take 5 mLs by mouth every 4 (four) hours as needed for cough (SUGAR FREE).    Elmore Guise Devices (ADJUSTABLE LANCING DEVICE) MISC USE TO check blood glucose    . levothyroxine (SYNTHROID, LEVOTHROID) 25 MCG tablet Take 25 mcg by mouth daily before breakfast.    . lisinopril (PRINIVIL,ZESTRIL) 20 MG tablet TAKE ONE TABLET BY MOUTH TWICE DAILY 180 tablet 3  . metoprolol tartrate (LOPRESSOR) 50 MG tablet TAKE 1 TABLET BY MOUTH TWICE DAILY 60 tablet 6  . Multiple Vitamins-Minerals (CENTRUM ADULTS PO) Take by mouth.    Marland Kitchen omeprazole (PRILOSEC) 20 MG capsule TAKE 1 CAPSULE BY MOUTH EVERY DAY 30 capsule 5  . potassium chloride (MICRO-K) 10 MEQ CR capsule TAKE TWO CAPSULES BY MOUTH DAILY FOR low potassium  5  . spironolactone (ALDACTONE) 25 MG tablet TAKE 1 TABLET BY MOUTH EVERY MORNING 90 tablet 2  . umeclidinium bromide (INCRUSE ELLIPTA) 62.5 MCG/INH AEPB Inhale 1 puff into the lungs daily.    . hydrALAZINE (APRESOLINE) 25 MG tablet Take 0.5 tablets (12.5 mg total) by mouth 2 (two) times daily. (Patient taking differently: Take 12.5 mg by mouth daily. )    . polyethylene glycol (MIRALAX / GLYCOLAX) packet Take 17 g 2 (two) times a week by mouth.     No facility-administered medications prior to visit.     Objective:     BP 138/70 (BP Location: Left Arm, Cuff Size: Normal)   Pulse (!) 53   Ht 5\' 6"  (1.676 m)   Wt 104 lb 8 oz (47.4 kg)   SpO2 100% Comment: on RA  BMI 16.87 kg/m   SpO2: 100 % (on RA)   Frail pleasant Elderly bf  Mod hoarse  walks with  [place]ane very hard of hearing but nad    HEENT : pt wearing mask not removed for exam due to covid -19 concerns.    NECK  :  without JVD/Nodes/TM/ nl carotid upstrokes bilaterally   LUNGS: no acc muscle use,  Nl contour chest which is clear to A and P bilaterally without cough on insp or exp maneuvers   CV:  RRR  no s3 or murmur or increase in P2, and 1+ pitting both LE's   ABD:  soft and nontender with nl inspiratory excursion in the supine position. No bruits or organomegaly appreciated, bowel sounds nl  MS:   ext warm without deformities, calf tenderness, cyanosis or  clubbing No obvious joint restrictions   SKIN: warm and dry without lesions    NEURO:  alert, approp, nl sensorium with  no motor or cerebellar deficits apparent.     CXR PA and Lateral:   11/19/2019 :    I personally reviewed images and agree with radiology impression as follows:   CM s chf   Labs ordered/ reviewed:      Chemistry      Component Value Date/Time   NA 138 11/19/2019 1111   K 5.3 (H) 11/19/2019 1111   CL 103 11/19/2019 1111   CO2 25 11/19/2019 1111   BUN 27 (H) 11/19/2019 1111   CREATININE 1.10 (H) 11/19/2019 1111   CREATININE 0.97 (H) 03/17/2016 1400      Component Value Date/Time   CALCIUM 9.4 11/19/2019 1111   ALKPHOS 61 07/17/2018 1318   AST 21 07/17/2018 1318   ALT 17 07/17/2018 1318   BILITOT 1.0 07/17/2018 1318        Lab Results  Component Value Date   WBC 3.1 (L) 11/19/2019   HGB 10.5 (L) 11/19/2019   HCT 34.0 (L) 11/19/2019   MCV 95.0 11/19/2019   PLT 223 11/19/2019       EOS                                                              0.1                                    11/19/2019     Lab Results  Component Value Date   TSH 2.478 11/19/2019         Lab Results  Component Value Date   ESRSEDRATE 23 (H) 11/19/2019            Assessment   Upper airway cough syndrome Onset around 2017 - d/c acei and incruse  11/19/2019   Upper airway cough syndrome (previously labeled PNDS),  is so named because it's frequently impossible to sort out how much is  CR/sinusitis with freq  throat clearing (which can be related to primary GERD)   vs  causing  secondary (" extra esophageal")  GERD from wide swings in gastric pressure that occur with throat clearing, often  promoting self use of mint and menthol lozenges that reduce the lower esophageal sphincter tone and exacerbate the problem further in a cyclical fashion.   These are the same pts (now being labeled as having "irritable larynx syndrome" by some cough centers) who not infrequently have a history of having failed to tolerate ace inhibitors (like lisinopril) ,  dry powder inhalers (like Incurse)  or biphosphonates or report having atypical/extraesophageal reflux symptoms that don't respond to standard doses of PPI  and are easily confused as having aecopd or asthma flares by even experienced allergists/ pulmonologists (myself included).   >>> try off acei/ dpi and on gerd diet then regroup in 6 weeks   Dyspnea on exertion No evidence for significant anemia/ thryroid dz/ renal dz or amiodarone toxicity based on initial w/u     Benign essential HTN D/C ACEi and K  11/19/2019 > replace with Avapro 150 mg one daily   In the best review of  chronic cough to date ( NEJM 2016 375 5176-1607) ,  ACEi are now felt to cause cough in up to  20% of pts which is a 4 fold increase from previous reports and does not include the variety of non-specific complaints we see in pulmonary clinic in pts on ACEi but previously attributed to another dx like  Copd/asthma and  include PNDS, throat and chest congestion, "bronchitis", unexplained dyspnea and noct "strangling" sensations, and hoarseness, but also  atypical /refractory GERD symptoms like dysphagia and "bad heartburn"   The only way I know  to prove this is not an "ACEi Case" is a trial off ACEi x a minimum of 6 weeks then regroup.   Try off acei and K/  avapro 150 mg daily x 6 weeks then regroup with ov    Oropharyngeal Dysphagia: See ST note 04/13/19 recs Dysphagia 2 (Fine chop)  solids;Thin liquid   Liquid Administration via Cup;No straw  Medication Administration Crushed with puree  Compensations Small sips/bites;Multiple dry swallows after each bite/sip;Follow solids with liquid;Clear throat intermittently;Chin tuck  Postural Changes Remain semi-upright after after feeds/meals (Comment);Seated upright at 90 degrees    Reviewed diet/ not clear to me she's really following it.  Hoping some of her throat symptoms are due to meds (see uacs)  Medical decision making was a  High  level of complexity in this case because of  Three chronic conditions /diagnoses requiring extra time for  H and P, chart review, counseling,   and generating customized AVS unique to this new pt office visit and same day charting.     Each maintenance medication was reviewed in detail including emphasizing most importantly the difference between maintenance and prns and under what circumstances the prns are to be triggered using an action plan format where appropriate. Please see avs for details which were reviewed in writing by both me and my nurse and patient given a written copy highlighted where appropriate with yellow highlighter for the patient's continued care at home along with an updated version of their medications.  Patient was asked to maintain medication reconciliation by comparing this list to the actual medications being used at home and to contact this office right away if there is a conflict or discrepancy.      Sandrea Hughs, MD 11/19/2019

## 2019-11-20 ENCOUNTER — Other Ambulatory Visit: Payer: Self-pay | Admitting: Cardiology

## 2019-11-20 ENCOUNTER — Telehealth: Payer: Self-pay | Admitting: Internal Medicine

## 2019-11-20 NOTE — Progress Notes (Signed)
LMTCB x 1 for [place] pt's daughter

## 2019-11-20 NOTE — Progress Notes (Signed)
LMTCB for the pt's daughter  

## 2019-11-20 NOTE — Telephone Encounter (Signed)
Charlene Cowden, MD  11/19/2019 1:50 PM EDT     Call patient : Studies are unremarkable x K high so d/c all K supplements  No other recs for now   Charlene Cowden, MD  11/19/2019 4:46 PM EDT     Call pt: Reviewed cxr and no acute change so no change in recommendations made at [facility] and spoke with pt's daughter Suzette Battiest letting her know the results of the labwork and cxr and stated to her for pt to d/c potassium supplements. Veronica verbalized understanding. Nothing further needed.

## 2019-11-21 ENCOUNTER — Telehealth: Payer: Self-pay | Admitting: Internal Medicine

## 2019-11-21 NOTE — Telephone Encounter (Signed)
Spoke with patient's daughter, Lazaro Arms listed on her DPR.  She verified that her mother was on potassium and has put it aside not to take d/t her elevated potassium level.  Advised daughter to contact prescribing physician to schedule an appointment d/t elevated potassium level for f/u and guidance on if further potassium supplementation would be needed.  She verbalized understanding.

## 2019-12-04 ENCOUNTER — Telehealth: Payer: Self-pay | Admitting: Internal Medicine

## 2019-12-04 NOTE — Telephone Encounter (Signed)
Never seen rash before with ibesartan but there are 100s of ways to rx HBP effectively that don't include acei or ARB class so weill need to see PCP for f/u of rash and hbp but for now agree with leave off ibesartan - copy to PCP

## 2019-12-04 NOTE — Telephone Encounter (Signed)
Patient's family going to get her into see her PCP as soon as possible. Patient has already stopped the medication.

## 2019-12-04 NOTE — Telephone Encounter (Signed)
Patient's daughter called to gather more information on rash. Patient has stopped the medication. Family has bathed the rash in alcohol. RN advised no further alcohol to skin. Waiting for call back. How large is the rash? Is there any pain at the site of the rash? What does the rash look like? Waiting for call.

## 2019-12-04 NOTE — Telephone Encounter (Signed)
Patient reporting rash on both shoulders and upper back. She is concerned she has an allergy to Irbesartan. Med started on 11/21/2019 and rash started on 12/01/2019. She has stopped the medication. Family bathed the site in alcohol, RN advised to stop using alcohol on the skin. She does not report any pain at the site just red bumps. Please advise.

## 2020-01-03 ENCOUNTER — Other Ambulatory Visit: Payer: Self-pay

## 2020-01-03 ENCOUNTER — Encounter: Payer: Self-pay | Admitting: Internal Medicine

## 2020-01-03 ENCOUNTER — Ambulatory Visit: Payer: Medicare Other | Admitting: Internal Medicine

## 2020-01-03 DIAGNOSIS — R058 Other specified cough: Secondary | ICD-10-CM

## 2020-01-03 DIAGNOSIS — I10 Essential (primary) hypertension: Secondary | ICD-10-CM | POA: Diagnosis not present

## 2020-01-03 DIAGNOSIS — R05 Cough: Secondary | ICD-10-CM | POA: Diagnosis not present

## 2020-01-03 NOTE — Progress Notes (Signed)
[name], female    DOB: 1927-05-20,   MRN: [id number]   Brief patient profile:  71 yobf never smoker with doe assoc with dysphagia x around 2017 prev f/b [name] self referred for cough > sob.     History of Present Illness  11/19/2019  Pulmonary/ 1st office eval/[name]  On incruse and acei and cough drops "by the mouthful"  Chief Complaint  Patient presents with  . Pulmonary Consult    Self referral. Former patient of Dr [name]. She c/o cough with white sputum. States breathing is overall doing well. She notices SOB most first thing in the am.   Dyspnea: after breakfast gets much worse assoc with sense of choking   Cough: sporadic min productive  Sleep: 30 degrees / has cough and sob spells like choking/ something stuck in throat  SABA use:  Lots of cough drops  Very confused with details of care and is in charge of her own "bag of pills" per daughter who is concerned she's on way too much meds rec Stop incruse and lisinopril  gerd diet   Only use your albuterol as a rescue medication   Take avapro 150 mg one daily instead of lisinopril  - break it in half if too strong    01/03/2020  f/u ov/[name] re: cough/ sob improved  Chief Complaint  Patient presents with  . Follow-up    non productive cough  Dyspnea:  Improved  Cough: helps to sit up at an angle at night / sometimes coughs p meals / mostly dry though  Sleeping: ok  SABA use: less  02: none    No obvious day to day or daytime variability or assoc excess/ purulent sputum or mucus plugs or hemoptysis or cp or chest tightness, subjective wheeze or overt   hb symptoms.     Also denies any obvious fluctuation of symptoms with weather or environmental changes or other aggravating or alleviating factors except as outlined above   No unusual exposure hx or h/o childhood pna/ asthma or knowledge of premature birth.  Current Allergies, Complete Past Medical History, Past Surgical History, Family History, and Social  History were reviewed in [employer] record.  ROS  The following are not active complaints unless bolded Hoarseness, sore throat, dysphagia, dental problems, itching, sneezing,  nasal congestion or discharge of excess mucus or purulent secretions, ear ache,   fever, chills, sweats, unintended wt loss or wt gain, classically pleuritic or exertional cp,  orthopnea pnd or arm/hand swelling  or leg swelling, presyncope, palpitations, abdominal pain, anorexia, nausea, vomiting, diarrhea  or change in bowel habits or change in bladder habits, change in stools or change in urine, dysuria, hematuria,  rash, arthralgias, visual complaints, headache, numbness, weakness or ataxia or problems with walking or coordination,  change in mood or  memory.        Current Meds -  - NOTE:   Unable to verify as accurately reflecting what pt takes     Medication Sig  . ACCU-CHEK AVIVA PLUS test strip 1 each daily.  Marland Kitchen acetaminophen (TYLENOL) 650 MG CR tablet Take 650 mg by mouth as needed for pain.  Marland Kitchen albuterol (PROVENTIL HFA;VENTOLIN HFA) 108 (90 Base) MCG/ACT inhaler Inhale 1-2 puffs into the lungs every 4 (four) hours as needed for wheezing or shortness of breath.  . ALPRAZolam (XANAX) 0.25 MG tablet Take 0.25 mg by mouth at bedtime as needed for anxiety.  Marland Kitchen amiodarone (PACERONE) 200 MG tablet TAKE  1/2 TABLET BY MOUTH EVERY DAY  . antiseptic oral rinse (BIOTENE) LIQD 15 mLs by Mouth Rinse route as needed for dry mouth.  Marland Kitchen atorvastatin (LIPITOR) 40 MG tablet Take 40 mg by mouth daily at 6 PM.  . bisacodyl (DULCOLAX) 5 MG EC tablet Take 5 mg by mouth daily as needed for moderate constipation.  . cetirizine (ZYRTEC) 10 MG tablet Take 5-10 mg by mouth daily as needed for allergies or rhinitis.   . COD LIVER OIL PO Take 15 mLs by mouth daily.  Marland Kitchen dextromethorphan 15 MG/5ML syrup Take 10 mLs by mouth 4 (four) times daily as needed for cough.  Marland Kitchen EASY COMFORT LANCETS MISC TO test blood glucose ONCE  daily  . ELIQUIS 2.5 MG TABS tablet TAKE 1 TABLET BY MOUTH TWICE DAILY  . feeding supplement, ENSURE ENLIVE, (ENSURE ENLIVE) LIQD Take 237 mLs by mouth 2 (two) times daily between meals.  . furosemide (LASIX) 20 MG tablet TAKE 1 TABLET BY MOUTH EVERY DAY AS NEEDED FOR FLUID  . guaiFENesin-dextromethorphan (ROBITUSSIN DM) 100-10 MG/5ML syrup Take 5 mLs by mouth every 4 (four) hours as needed for cough (SUGAR FREE).  Demetra Shiner Devices (ADJUSTABLE LANCING DEVICE) MISC USE TO check blood glucose  . levothyroxine (SYNTHROID, LEVOTHROID) 25 MCG tablet Take 25 mcg by mouth daily before breakfast.  . metoprolol tartrate (LOPRESSOR) 50 MG tablet TAKE 1 TABLET BY MOUTH TWICE DAILY  . Multiple Vitamins-Minerals (CENTRUM ADULTS PO) Take by mouth.  Marland Kitchen omeprazole (PRILOSEC) 20 MG capsule TAKE 1 CAPSULE BY MOUTH EVERY DAY  . polyethylene glycol (MIRALAX / GLYCOLAX) packet Take 17 g 2 (two) times a week by mouth.  . potassium chloride (MICRO-K) 10 MEQ CR capsule TAKE TWO CAPSULES BY MOUTH DAILY FOR low potassium                Past Medical History:  Diagnosis Date  . Anxiety   . Atrial fibrillation (HCC)   . COPD (chronic obstructive pulmonary disease) (HCC)   . Depression   . Essential hypertension   . Hyperlipidemia   . Hypothyroidism   . Mitral regurgitation   . Palpitations    PAT/EAT/NSVT, normal LVEF  . Type 2 diabetes mellitus (HCC)       Objective:    Wt Readings from Last 3 Encounters:  01/03/20 103 lb 12.8 oz (47.1 kg)  11/19/19 104 lb 8 oz (47.4 kg)  10/02/19 109 lb 9.6 oz (49.7 kg)     Vital signs reviewed - Note on arrival 01/03/2020  02 sats  100% on RA and BP only 102/62 off avapro x one week?      amb elderly thin bf nad  - extremely hard of hearing        HEENT : pt wearing mask not removed for exam due to covid -19 concerns.    NECK :  without JVD/Nodes/TM/ nl carotid upstrokes bilaterally   LUNGS: no acc muscle use,  Mildly kyphotic chest with distant bs  bilaterally without cough on insp or exp maneuvers   CV:  RRR  no s3  2/6 HSM or increase in P2, and trace bilaterally sym LE pitting edema   ABD:  soft and nontender with nl inspiratory excursion in the supine position. No bruits or organomegaly appreciated, bowel sounds nl  MS:  Nl gait/ ext warm without deformities, calf tenderness, cyanosis or clubbing No obvious joint restrictions   SKIN: warm and dry without lesions    NEURO:  alert, approp, nl sensorium  with  no motor or cerebellar deficits apparent.          Assessment

## 2020-01-03 NOTE — Patient Instructions (Addendum)
When you go to your doctors take all your medications in their bottles and if there are medications you are using any more, put them in a separate bag at home.   Pulmonary follow up is as needed for worse cough or shortness of breath but I strongly suspect that most of the problems are related to swallowing and fluid build up, not lung disease.

## 2020-01-03 NOTE — Assessment & Plan Note (Signed)
Onset around 2017 - d/c acei and incruse  11/19/2019 > improved 01/03/2020   Upper airway cough syndrome (previously labeled PNDS),  is so named because it's frequently impossible to sort out how much is  CR/sinusitis with freq throat clearing (which can be related to primary GERD)   vs  causing  secondary (" extra esophageal")  GERD from wide swings in gastric pressure that occur with throat clearing, often  promoting self use of mint and menthol lozenges that reduce the lower esophageal sphincter tone and exacerbate the problem further in a cyclical fashion.   These are the same pts (now being labeled as having "irritable larynx syndrome" by some cough centers) who not infrequently have a history of having failed to tolerate ace inhibitors/dry powder inhalers (better off both)  or biphosphonates or report having atypical/extraesophageal reflux symptoms that don't respond to standard doses of PPI  and are easily confused as having aecopd or asthma flares by even experienced allergists/ pulmonologists (myself included).   rec use arb in low doses if tolerates but avoid acei indefinitely / continue gerd diet / ppi and f/u here prn

## 2020-01-03 NOTE — Assessment & Plan Note (Addendum)
D/C ACEi and K  11/19/2019 > replace with Avapro 150 mg one daily > bp too low even on 75 mg / day so pt d/c'd 12/2019   In setting of severe MR probably should be on afterload reduction but the problem is I'm not sure we have accurate accounting of meds and bp too low to try here in pt improved on present rx   Rec: Return to all docs with all meds in hand using a trust but verify approach to confirm accurate Medication  Reconciliation The principal here is that until we are certain that the  patients are doing what we've asked, it makes no sense to ask them to do more.   F/u here prn    .       Each maintenance medication was reviewed in detail including emphasizing most importantly the difference between maintenance and prns and under what circumstances the prns are to be triggered using an action plan format where appropriate.  Total time for H and P, chart review, counseling, teaching device and generating customized AVS unique to this summary office visit / charting = 40 min

## 2020-01-21 ENCOUNTER — Other Ambulatory Visit: Payer: Self-pay | Admitting: Cardiology

## 2020-01-22 ENCOUNTER — Telehealth: Payer: Self-pay | Admitting: Cardiology

## 2020-01-22 NOTE — Telephone Encounter (Signed)
Daughter states she is concerned re:Pt's pulse   Please call daughter Dewayne Hatch [phone]   Thanks renee

## 2020-01-22 NOTE — Telephone Encounter (Signed)
Per daughter [name], pulse ranging around 111 since Friday last week with BP (right) 122/73 HR 111 & (left) 103/84 HR 114. 01/21/19 (left) 123/83 HR 117 (right) 122/80 HR 114) Per daughter, BP & HR is being checked with a digital monitor. Per daughter, patient c/o SOB, dizziness and felt her heart beating fast, on yesterday and over the weekend. Denies chest pain. Today, patient denies SOB, dizziness or chest pain. Reports feeling weak today. Medications reviewed. Advised that patient needed and evaluation especially EKG. Advised that message would be sent to provider for recommendations. Advised if symptoms got worse, to go to the ED for an evaluation. Verbalized understanding of plan.

## 2020-01-22 NOTE — Telephone Encounter (Signed)
[facility] contacted and scheduled appointment for patient with Rudi Coco tomorrow 01/23/20 @11 :30 am, parking code is [id number].  Discussed with Dr. who agrees with a-fib clinic appointment.   Daughter, Diona Browner, informed and verbalized understanding of plan.

## 2020-01-23 ENCOUNTER — Encounter (HOSPITAL_COMMUNITY): Payer: Self-pay | Admitting: Nurse Practitioner

## 2020-01-23 ENCOUNTER — Other Ambulatory Visit: Payer: Self-pay

## 2020-01-23 ENCOUNTER — Ambulatory Visit (HOSPITAL_COMMUNITY)
Admission: RE | Admit: 2020-01-23 | Discharge: 2020-01-23 | Disposition: A | Discharge status: Home / Self Care | Payer: Medicare Other | Source: Ambulatory Visit | Attending: Nurse Practitioner | Admitting: Nurse Practitioner

## 2020-01-23 VITALS — BP 150/84 | HR 113 | Ht 64.0 in | Wt 102.0 lb

## 2020-01-23 DIAGNOSIS — I10 Essential (primary) hypertension: Secondary | ICD-10-CM | POA: Insufficient documentation

## 2020-01-23 DIAGNOSIS — E039 Hypothyroidism, unspecified: Secondary | ICD-10-CM | POA: Insufficient documentation

## 2020-01-23 DIAGNOSIS — F419 Anxiety disorder, unspecified: Secondary | ICD-10-CM | POA: Insufficient documentation

## 2020-01-23 DIAGNOSIS — F329 Major depressive disorder, single episode, unspecified: Secondary | ICD-10-CM | POA: Insufficient documentation

## 2020-01-23 DIAGNOSIS — I4891 Unspecified atrial fibrillation: Secondary | ICD-10-CM | POA: Insufficient documentation

## 2020-01-23 DIAGNOSIS — Z79899 Other long term (current) drug therapy: Secondary | ICD-10-CM | POA: Diagnosis not present

## 2020-01-23 DIAGNOSIS — I480 Paroxysmal atrial fibrillation: Secondary | ICD-10-CM | POA: Diagnosis not present

## 2020-01-23 DIAGNOSIS — D6869 Other thrombophilia: Secondary | ICD-10-CM | POA: Diagnosis not present

## 2020-01-23 DIAGNOSIS — E119 Type 2 diabetes mellitus without complications: Secondary | ICD-10-CM | POA: Insufficient documentation

## 2020-01-23 DIAGNOSIS — J449 Chronic obstructive pulmonary disease, unspecified: Secondary | ICD-10-CM | POA: Diagnosis not present

## 2020-01-23 DIAGNOSIS — E785 Hyperlipidemia, unspecified: Secondary | ICD-10-CM | POA: Diagnosis not present

## 2020-01-23 DIAGNOSIS — Z87891 Personal history of nicotine dependence: Secondary | ICD-10-CM | POA: Insufficient documentation

## 2020-01-23 DIAGNOSIS — Z7901 Long term (current) use of anticoagulants: Secondary | ICD-10-CM | POA: Insufficient documentation

## 2020-01-23 DIAGNOSIS — I340 Nonrheumatic mitral (valve) insufficiency: Secondary | ICD-10-CM | POA: Insufficient documentation

## 2020-01-23 DIAGNOSIS — Z79890 Hormone replacement therapy (postmenopausal): Secondary | ICD-10-CM | POA: Diagnosis not present

## 2020-01-23 DIAGNOSIS — R002 Palpitations: Secondary | ICD-10-CM | POA: Diagnosis not present

## 2020-01-23 MED ORDER — AMIODARONE HCL 200 MG PO TABS: 200.0000 mg | ORAL_TABLET | Freq: Every day | ORAL | 2 refills | Status: DC

## 2020-01-23 MED ORDER — METOPROLOL TARTRATE 50 MG PO TABS: 75.0000 mg | ORAL_TABLET | Freq: Two times a day (BID) | ORAL | 2 refills | Status: DC

## 2020-01-23 MED ORDER — AMIODARONE HCL 200 MG PO TABS: 200.0000 mg | ORAL_TABLET | Freq: Every day | ORAL | 1 refills | Status: DC

## 2020-01-23 MED ORDER — METOPROLOL TARTRATE 50 MG PO TABS: 75.0000 mg | ORAL_TABLET | Freq: Two times a day (BID) | ORAL | 6 refills | Status: DC

## 2020-01-23 NOTE — Patient Instructions (Signed)
Increase Amiodarone to 200mg  once a day  Increase metoprolol to 75mg  twice a day (1 and 1/2 tablets twice a day)

## 2020-01-23 NOTE — Progress Notes (Signed)
Primary Care Physician: Gareth Morgan, MD Referring Physician: Dr. Nona Dell    [name] is a 86 y.o. female with a h/o afib on amiodarone, moderate diastolic dysfunction, progressive valvular disease with severe MR, and mod to severe tricuspid regurgitation, with severe pulmonary HTN. Her family reported last week that they noted elevation of her heart rate and the pt mentioned that she could feel her heart racing so she was referred here.  Ekg shows afib at 113 bpm. She is here with her daughter. She is very HOH. The daughter reports that her mother does all of her own cooking and eats like a horse but despite this has lost around 80 pounds over the last year or so. Her PCP is aware.  She has continued to feel well enough to continue her cooking even with the elevation of heart rate. No other change in condition. She lives alone but several family members stop by every day. She has not noted any new LLE.   Today, she denies symptoms of palpitations, chest pain, shortness of breath, orthopnea, PND, lower extremity edema, dizziness, presyncope, syncope, or neurologic sequela. The patient is tolerating medications without difficulties and is otherwise without complaint today.   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 hiatal hernia. Antral erosions status post gastric biopsy.   ESOPHAGOGASTRODUODENOSCOPY N/A 05/12/2016   Dr. Jena Gauss: moderate Schatzki ring s/p dilation. moderate sized hiatal  hernia   MALONEY DILATION N/A 04/17/2014   Procedure: [name] DILATION;  Surgeon: Corbin Ade, MD;  [facility];  Service: Endoscopy;  Laterality: N/A;   MALONEY DILATION N/A 05/12/2016   Procedure: [name] DILATION;  Surgeon: Corbin Ade, MD;  [facility];  Service: Endoscopy;  Laterality: N/A;   none      Current Outpatient Medications  Medication Sig Dispense Refill   ACCU-CHEK AVIVA PLUS test strip 1 each daily.     acetaminophen (TYLENOL) 650 MG CR tablet Take 650 mg by mouth as needed for pain.     albuterol (PROVENTIL HFA;VENTOLIN HFA) 108 (90 Base) MCG/ACT inhaler Inhale 1-2 puffs into the lungs every 4 (four) hours as needed for wheezing or shortness of breath.     ALPRAZolam (XANAX) 0.25 MG tablet Take 0.25 mg by mouth at bedtime as needed for anxiety.     amiodarone (PACERONE) 200 MG tablet Take 1 tablet (200 mg total) by mouth daily. 90 tablet 2   atorvastatin (LIPITOR) 40 MG tablet Take 40 mg by mouth daily at 6 PM.     bisacodyl (DULCOLAX) 5 MG EC tablet Take 5 mg by mouth daily as needed for moderate constipation.     cetirizine (ZYRTEC) 10 MG tablet Take 5-10 mg by mouth daily as needed for allergies or rhinitis.      cloNIDine (CATAPRES) 0.1 MG tablet TAKE 1 TABLET BY MOUTH UP TO THREE TIMES DAILY IF BLOOD PRESSURE IS GREATER THAN 140/90     COD LIVER OIL PO Take 15  mLs by mouth daily.     dextromethorphan 15 MG/5ML syrup Take 10 mLs by mouth 4 (four) times daily as needed for cough.     EASY COMFORT LANCETS MISC TO test blood glucose ONCE daily     ELIQUIS 2.5 MG TABS tablet TAKE 1 TABLET BY MOUTH TWICE DAILY 180 tablet 1   feeding supplement, ENSURE ENLIVE, (ENSURE ENLIVE) LIQD Take 237 mLs by mouth 2 (two) times daily between meals.     furosemide (LASIX) 20 MG tablet TAKE 1 TABLET BY MOUTH EVERY DAY AS NEEDED FOR FLUID 30 tablet 6   irbesartan (AVAPRO) 150 MG tablet Take 1 tablet (150 mg total) by mouth daily. 30 tablet 11    Lancet Devices (ADJUSTABLE LANCING DEVICE) MISC USE TO check blood glucose     levothyroxine (SYNTHROID, LEVOTHROID) 25 MCG tablet Take 25 mcg by mouth daily before breakfast.     metoprolol tartrate (LOPRESSOR) 50 MG tablet Take 1.5 tablets (75 mg total) by mouth 2 (two) times daily. 90 tablet 2   omeprazole (PRILOSEC) 20 MG capsule TAKE 1 CAPSULE BY MOUTH EVERY DAY 30 capsule 5   spironolactone (ALDACTONE) 25 MG tablet TAKE 1 TABLET BY MOUTH EVERY MORNING (Patient not taking: Reported on 01/22/2020) 90 tablet 2   hydrALAZINE (APRESOLINE) 25 MG tablet Take 0.5 tablets (12.5 mg total) by mouth 2 (two) times daily. (Patient not taking: Reported on 01/22/2020)     potassium chloride (MICRO-K) 10 MEQ CR capsule TAKE TWO CAPSULES BY MOUTH DAILY FOR low potassium (Patient not taking: Reported on 01/22/2020)  5   No current facility-administered medications for this encounter.    Allergies  Allergen Reactions   Chocolate     Hives    Social History   Socioeconomic History   Marital status: Married    Spouse name: Not on file   Number of children: Not on file   Years of education: 11   Highest education level: Not on file  Occupational History   Occupation: Retired    Associate Professor: RETIRED  Tobacco Use   Smoking status: Former Smoker   Smokeless tobacco: Never Used  Building services engineer Use: Never used  Substance and Sexual Activity   Alcohol use: No    Alcohol/week: 0.0 standard drinks   Drug use: No   Sexual activity: Never  Other Topics Concern   Not on file  Social History Narrative   Not on file   Social Determinants of Health   Financial Resource Strain:    Difficulty of Paying Living Expenses: Not on file  Food Insecurity:    Worried About Programme researcher, broadcasting/film/video in the Last Year: Not on file   [employer] of Food in the Last Year: Not on file  Transportation Needs:    Lack of Transportation (Medical): Not on file   Lack of Transportation  (Non-Medical): Not on file  Physical Activity:    Days of Exercise per Week: Not on file   Minutes of Exercise per Session: Not on file  Stress:    Feeling of Stress : Not on file  Social Connections:    Frequency of Communication with Friends and Family: Not on file   Frequency of Social Gatherings with Friends and Family: Not on file   Attends Religious Services: Not on file   Active Member of Clubs or Organizations: Not on file   Attends Banker Meetings: Not on file   Marital Status: Not on file  Intimate Partner  Violence:    Fear of Current or Ex-Partner: Not on file   Emotionally Abused: Not on file   Physically Abused: Not on file   Sexually Abused: Not on file    Family History  Problem Relation Age of Onset   Heart failure Mother        Died in her 71s   Tuberculosis Father        Died at young age   Cancer Brother    Arthritis Other    Colon cancer Other     ROS- All systems are reviewed and negative except as per the HPI above  Physical Exam: Vitals:   01/23/20 1125  BP: (!) 150/84  Pulse: (!) 113  Weight: 46.3 kg  Height: 5\' 4"  (1.626 m)   Wt Readings from Last 3 Encounters:  01/23/20 46.3 kg  01/03/20 47.1 kg  11/19/19 47.4 kg    Labs: Lab Results  Component Value Date   NA 138 11/19/2019   K 5.3 (H) 11/19/2019   CL 103 11/19/2019   CO2 25 11/19/2019   GLUCOSE 111 (H) 11/19/2019   BUN 27 (H) 11/19/2019   CREATININE 1.10 (H) 11/19/2019   CALCIUM 9.4 11/19/2019   PHOS 3.7 05/08/2010   MG 1.9 03/14/2016   Lab Results  Component Value Date   INR 1.18 03/05/2018   Lab Results  Component Value Date   CHOL 142 07/14/2014   HDL 58 07/14/2014   LDLCALC 78 07/14/2014   TRIG 32 07/14/2014     GEN- The patient is well appearing, alert and oriented x 3 today.   Head- normocephalic, atraumatic Eyes-  Sclera clear, conjunctiva pink Ears- hearing intact Oropharynx- clear Neck- supple, no JVP Lymph- no cervical  lymphadenopathy Lungs- Clear to ausculation bilaterally, normal work of breathing Heart- irregular rate and rhythm, no murmurs, rubs or gallops, PMI not laterally displaced GI- soft, NT, ND, + BS Extremities- no clubbing, cyanosis, or edema MS- no significant deformity or atrophy Skin- no rash or lesion Psych- euthymic mood, full affect Neuro- strength and sensation are intact  EKG-afib at 113 bpm, qrs int 126 ms, qtc 488 ms    Assessment and Plan: 1. Afib  Sounds as though it has been persistent since last week Pt's biggest complaint is that she can feel it racing at times I will increase her amiodarone to 200 mg daily and increase metoprolol 50 mg 1/12/ tabs bid  I will then see back in 2 weeks to see what she is doing then The daughter will watch her V/S's over that period of time and will report any low BP's with increase of BB  2. CHA2DS2VASc of at least 6  Continue eliquis 2.5 mg bid   See back in 2 weeks  11-13-1967 C. Lup[facility] [address] [place], [place] [phone]

## 2020-02-05 ENCOUNTER — Telehealth: Payer: Self-pay | Admitting: Internal Medicine

## 2020-02-05 NOTE — Telephone Encounter (Signed)
Spoke with the pt  She is calling stating that she wants to possibly get back on Incruse and has recently starting taking this again  We had stopped this as of last visit  She seemed confused about her meds  I advised needs ov with all meds in hand to discuss plan and she was okay with this and scheduled appt for 03/11/20 in [place]

## 2020-02-06 ENCOUNTER — Other Ambulatory Visit: Payer: Self-pay

## 2020-02-06 ENCOUNTER — Encounter (HOSPITAL_COMMUNITY): Payer: Self-pay | Admitting: Nurse Practitioner

## 2020-02-06 ENCOUNTER — Ambulatory Visit (HOSPITAL_COMMUNITY)
Admission: RE | Admit: 2020-02-06 | Discharge: 2020-02-06 | Disposition: A | Discharge status: Home / Self Care | Payer: Medicare Other | Source: Ambulatory Visit | Attending: Nurse Practitioner | Admitting: Nurse Practitioner

## 2020-02-06 VITALS — BP 160/86 | HR 79 | Ht 64.0 in | Wt 103.6 lb

## 2020-02-06 DIAGNOSIS — E119 Type 2 diabetes mellitus without complications: Secondary | ICD-10-CM | POA: Diagnosis not present

## 2020-02-06 DIAGNOSIS — Z8249 Family history of ischemic heart disease and other diseases of the circulatory system: Secondary | ICD-10-CM | POA: Diagnosis not present

## 2020-02-06 DIAGNOSIS — Z79899 Other long term (current) drug therapy: Secondary | ICD-10-CM | POA: Diagnosis not present

## 2020-02-06 DIAGNOSIS — Z87891 Personal history of nicotine dependence: Secondary | ICD-10-CM | POA: Diagnosis not present

## 2020-02-06 DIAGNOSIS — I10 Essential (primary) hypertension: Secondary | ICD-10-CM | POA: Diagnosis not present

## 2020-02-06 DIAGNOSIS — E785 Hyperlipidemia, unspecified: Secondary | ICD-10-CM | POA: Insufficient documentation

## 2020-02-06 DIAGNOSIS — Z7901 Long term (current) use of anticoagulants: Secondary | ICD-10-CM | POA: Insufficient documentation

## 2020-02-06 DIAGNOSIS — E039 Hypothyroidism, unspecified: Secondary | ICD-10-CM | POA: Diagnosis not present

## 2020-02-06 DIAGNOSIS — J449 Chronic obstructive pulmonary disease, unspecified: Secondary | ICD-10-CM | POA: Diagnosis not present

## 2020-02-06 DIAGNOSIS — I4891 Unspecified atrial fibrillation: Secondary | ICD-10-CM | POA: Diagnosis present

## 2020-02-06 DIAGNOSIS — Z79890 Hormone replacement therapy (postmenopausal): Secondary | ICD-10-CM | POA: Insufficient documentation

## 2020-02-06 DIAGNOSIS — I4819 Other persistent atrial fibrillation: Secondary | ICD-10-CM

## 2020-02-06 MED ORDER — AMIODARONE HCL 200 MG PO TABS: 100.0000 mg | ORAL_TABLET | Freq: Every day | ORAL | 2 refills | Status: DC

## 2020-02-06 NOTE — Progress Notes (Signed)
Primary Care Physician: Gareth Morgan, MD Referring Physician: Dr. Nona Dell    [name] is a 86 y.o. female with a h/o afib on amiodarone, moderate diastolic dysfunction, progressive valvular disease with severe MR, and mod to severe tricuspid regurgitation, with severe pulmonary HTN. Her family reported last week that they noted elevation of her heart rate and the pt mentioned that she could feel her heart racing so she was referred here.  Ekg shows afib at 113 bpm. She is here with her daughter. She is very HOH. The daughter reports that her mother does all of her own cooking and eats like a horse but despite this has lost around 80 pounds over the last year or so. Her PCP is aware.  She has continued to feel well enough to continue her cooking even with the elevation of heart rate. No other change in condition. She lives alone but several family members stop by every day. She has not noted any new LLE.   F/u in afib clinic, 02/06/20. EKG now shows atrial flutter at 79 bpm. She feels improved. Discussed undergoing cardioversion. She and her daughter are undecided. She is noting some shortness of breath.  Today, she denies symptoms of palpitations, chest pain, shortness of breath, orthopnea, PND, lower extremity edema, dizziness, presyncope, syncope, or neurologic sequela. The patient is tolerating medications without difficulties and is otherwise without complaint today.   Past Medical History:  Diagnosis Date  . Anxiety   . Atrial fibrillation (HCC)   . COPD (chronic obstructive pulmonary disease) (HCC)   . Depression   . Essential hypertension   . Hyperlipidemia   . Hypothyroidism   . Mitral regurgitation   . Palpitations    PAT/EAT/NSVT, normal LVEF  . Type 2 diabetes mellitus (HCC)    Past Surgical History:  Procedure Laterality Date  . 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 hiatal hernia. Antral erosions status post gastric biopsy.  . ESOPHAGOGASTRODUODENOSCOPY N/A 05/12/2016   Dr. Jena Gauss: moderate Schatzki ring s/p dilation. moderate sized hiatal hernia  . MALONEY DILATION N/A 04/17/2014   Procedure: [name] DILATION;  Surgeon: Corbin Ade, MD;  [facility];  Service: Endoscopy;  Laterality: N/A;  [name] DILATION N/A 05/12/2016   Procedure: [name] DILATION;  Surgeon: Corbin Ade, MD;  [facility];  Service: Endoscopy;  Laterality: N/A;  . none      Current Outpatient Medications  Medication Sig Dispense Refill  . spironolactone (ALDACTONE) 25 MG tablet TAKE 1 TABLET BY MOUTH EVERY MORNING (Patient not taking: Reported on 01/22/2020) 90 tablet 2  . ACCU-CHEK AVIVA PLUS test strip 1 each daily.    Marland Kitchen acetaminophen (TYLENOL) 650 MG CR tablet Take 650 mg by mouth as needed for pain.    Marland Kitchen albuterol (PROVENTIL HFA;VENTOLIN HFA) 108 (90 Base) MCG/ACT inhaler Inhale 1-2 puffs into the lungs every 4 (four) hours as needed for wheezing or shortness of breath.    . ALPRAZolam (XANAX) 0.25 MG tablet Take 0.25 mg by mouth at bedtime as needed for anxiety.    Marland Kitchen amiodarone (PACERONE) 200 MG tablet Take 1 tablet (200 mg total) by mouth daily. 90 tablet 2  . atorvastatin (LIPITOR) 40 MG tablet Take 40 mg by mouth daily at 6 PM.    . bisacodyl (DULCOLAX) 5 MG EC tablet Take 5 mg by mouth daily as needed for moderate constipation.    Marland Kitchen  cetirizine (ZYRTEC) 10 MG tablet Take 5-10 mg by mouth daily as needed for allergies or rhinitis.     . cloNIDine (CATAPRES) 0.1 MG tablet TAKE 1 TABLET BY MOUTH UP TO THREE TIMES DAILY IF BLOOD PRESSURE IS GREATER THAN 140/90    . COD LIVER OIL PO Take 15 mLs by mouth daily.    Marland Kitchen dextromethorphan 15 MG/5ML syrup Take 10 mLs by mouth 4 (four) times daily as needed for cough.    Marland Kitchen EASY COMFORT LANCETS MISC TO test blood glucose ONCE daily    . ELIQUIS 2.5 MG TABS tablet TAKE 1 TABLET BY  MOUTH TWICE DAILY 180 tablet 1  . feeding supplement, ENSURE ENLIVE, (ENSURE ENLIVE) LIQD Take 237 mLs by mouth 2 (two) times daily between meals.    . furosemide (LASIX) 20 MG tablet TAKE 1 TABLET BY MOUTH EVERY DAY AS NEEDED FOR FLUID 30 tablet 6  . hydrALAZINE (APRESOLINE) 25 MG tablet Take 0.5 tablets (12.5 mg total) by mouth 2 (two) times daily. (Patient not taking: Reported on 01/22/2020)    . irbesartan (AVAPRO) 150 MG tablet Take 1 tablet (150 mg total) by mouth daily. 30 tablet 11  . Lancet Devices (ADJUSTABLE LANCING DEVICE) MISC USE TO check blood glucose    . levothyroxine (SYNTHROID, LEVOTHROID) 25 MCG tablet Take 25 mcg by mouth daily before breakfast.    . metoprolol tartrate (LOPRESSOR) 50 MG tablet Take 1.5 tablets (75 mg total) by mouth 2 (two) times daily. 90 tablet 2  . omeprazole (PRILOSEC) 20 MG capsule TAKE 1 CAPSULE BY MOUTH EVERY DAY 30 capsule 5  . potassium chloride (MICRO-K) 10 MEQ CR capsule TAKE TWO CAPSULES BY MOUTH DAILY FOR low potassium (Patient not taking: Reported on 01/22/2020)  5   No current facility-administered medications for this encounter.    Allergies  Allergen Reactions  . Chocolate     Hives    Social History   Socioeconomic History  . Marital status: Married    Spouse name: Not on file  . Number of children: Not on file  . Years of education: 49  . Highest education level: Not on file  Occupational History  . Occupation: Retired    Associate Professor: RETIRED  Tobacco Use  . Smoking status: Former Games developer  . Smokeless tobacco: Never Used  Vaping Use  . Vaping Use: Never used  Substance and Sexual Activity  . Alcohol use: No    Alcohol/week: 0.0 standard drinks  . Drug use: No  . Sexual activity: Never  Other Topics Concern  . Not on file  Social History Narrative  . Not on file   Social Determinants of Health   Financial Resource Strain:   . Difficulty of Paying Living Expenses: Not on file  Food Insecurity:   . Worried About  Programme researcher, broadcasting/film/video in the Last Year: Not on file  . Ran Out of Food in the Last Year: Not on file  Transportation Needs:   . Lack of Transportation (Medical): Not on file  . Lack of Transportation (Non-Medical): Not on file  Physical Activity:   . Days of Exercise per Week: Not on file  . Minutes of Exercise per Session: Not on file  Stress:   . Feeling of Stress : Not on file  Social Connections:   . Frequency of Communication with Friends and Family: Not on file  . Frequency of Social Gatherings with Friends and Family: Not on file  . Attends Religious Services: Not on file  .  Active Member of Clubs or Organizations: Not on file  . Attends Banker Meetings: Not on file  . Marital Status: Not on file  Intimate Partner Violence:   . Fear of Current or Ex-Partner: Not on file  . Emotionally Abused: Not on file  . Physically Abused: Not on file  . Sexually Abused: Not on file    Family History  Problem Relation Age of Onset  . Heart failure Mother        Died in her 16s  . Tuberculosis Father        Died at young age  . Cancer Brother   . Arthritis Other   . Colon cancer Other     ROS- All systems are reviewed and negative except as per the HPI above  Physical Exam: Vitals:   02/06/20 1105  Height: 5\' 4"  (1.626 m)   Wt Readings from Last 3 Encounters:  01/23/20 46.3 kg  01/03/20 47.1 kg  11/19/19 47.4 kg    Labs: Lab Results  Component Value Date   NA 138 11/19/2019   K 5.3 (H) 11/19/2019   CL 103 11/19/2019   CO2 25 11/19/2019   GLUCOSE 111 (H) 11/19/2019   BUN 27 (H) 11/19/2019   CREATININE 1.10 (H) 11/19/2019   CALCIUM 9.4 11/19/2019   PHOS 3.7 05/08/2010   MG 1.9 03/14/2016   Lab Results  Component Value Date   INR 1.18 03/05/2018   Lab Results  Component Value Date   CHOL 142 07/14/2014   HDL 58 07/14/2014   LDLCALC 78 07/14/2014   TRIG 32 07/14/2014     GEN- The patient is well appearing, alert and oriented x 3 today.     Head- normocephalic, atraumatic Eyes-  Sclera clear, conjunctiva pink Ears- hearing intact Oropharynx- clear Neck- supple, no JVP Lymph- no cervical lymphadenopathy Lungs- Clear to ausculation bilaterally, normal work of breathing Heart- irregular rate and rhythm, no murmurs, rubs or gallops, PMI not laterally displaced GI- soft, NT, ND, + BS Extremities- no clubbing, cyanosis, or edema MS- no significant deformity or atrophy Skin- no rash or lesion Psych- euthymic mood, full affect Neuro- strength and sensation are intact  EKG-atrial flutter at 79 bpm, pr int 202 ms, qrs int 130 ms, qtc 474 ms     Assessment and Plan: 1. Afib   Now with increase of amiodarone to 200 mg daily and metoprolol to 37.5 mg bid  She is in rate controlled atrial flutter at 79 bpm, she feels improved Staying in flutter vrs cardioversion discussed today,she and daughter are undecided  I will lower her amiodarone back  to 100  mg daily as she has noted some mild increase in shortness of breath in the am, possibly amiodarone or possibly being out of rhythm  Continue  metoprolol 50 mg at higher dose of  1/12/ tabs bid  I will  see back in 2 weeks and further discuss staying in rate controlled afib/flutter vrs  Cardioversion    2. CHA2DS2VASc of at least 6  Continue eliquis 2.5 mg bid   See back in 2 weeks  11-13-1967 C. Lupita Leash [facility] [address] [place], [place] [phone]

## 2020-02-06 NOTE — Patient Instructions (Signed)
Amiodarone 100mg  once a day (1/2 of the 200mg  tablet)

## 2020-02-19 ENCOUNTER — Other Ambulatory Visit: Payer: Self-pay

## 2020-02-19 ENCOUNTER — Ambulatory Visit (HOSPITAL_COMMUNITY)
Admission: RE | Admit: 2020-02-19 | Discharge: 2020-02-19 | Disposition: A | Discharge status: Home / Self Care | Payer: Medicare Other | Source: Ambulatory Visit | Attending: Nurse Practitioner | Admitting: Nurse Practitioner

## 2020-02-19 VITALS — BP 156/76 | HR 100 | Ht 64.0 in | Wt 102.2 lb

## 2020-02-19 DIAGNOSIS — J449 Chronic obstructive pulmonary disease, unspecified: Secondary | ICD-10-CM | POA: Insufficient documentation

## 2020-02-19 DIAGNOSIS — I4819 Other persistent atrial fibrillation: Secondary | ICD-10-CM

## 2020-02-19 DIAGNOSIS — Z8249 Family history of ischemic heart disease and other diseases of the circulatory system: Secondary | ICD-10-CM | POA: Insufficient documentation

## 2020-02-19 DIAGNOSIS — E119 Type 2 diabetes mellitus without complications: Secondary | ICD-10-CM | POA: Diagnosis not present

## 2020-02-19 DIAGNOSIS — Z87891 Personal history of nicotine dependence: Secondary | ICD-10-CM | POA: Diagnosis not present

## 2020-02-19 DIAGNOSIS — E785 Hyperlipidemia, unspecified: Secondary | ICD-10-CM | POA: Insufficient documentation

## 2020-02-19 DIAGNOSIS — E039 Hypothyroidism, unspecified: Secondary | ICD-10-CM | POA: Insufficient documentation

## 2020-02-19 DIAGNOSIS — D6869 Other thrombophilia: Secondary | ICD-10-CM

## 2020-02-19 DIAGNOSIS — Z79890 Hormone replacement therapy (postmenopausal): Secondary | ICD-10-CM | POA: Insufficient documentation

## 2020-02-19 DIAGNOSIS — Z7901 Long term (current) use of anticoagulants: Secondary | ICD-10-CM | POA: Diagnosis not present

## 2020-02-19 DIAGNOSIS — I4891 Unspecified atrial fibrillation: Secondary | ICD-10-CM | POA: Insufficient documentation

## 2020-02-19 DIAGNOSIS — Z79899 Other long term (current) drug therapy: Secondary | ICD-10-CM | POA: Diagnosis not present

## 2020-02-19 DIAGNOSIS — I10 Essential (primary) hypertension: Secondary | ICD-10-CM | POA: Insufficient documentation

## 2020-02-19 LAB — TSH: TSH: 3.822 u[IU]/mL (ref 0.350–4.500)

## 2020-02-19 LAB — COMPREHENSIVE METABOLIC PANEL
ALT: 22 U/L (ref 0–44)
AST: 28 U/L (ref 15–41)
Albumin: 3.8 g/dL (ref 3.5–5.0)
Alkaline Phosphatase: 59 U/L (ref 38–126)
Anion gap: 8 (ref 5–15)
BUN: 24 mg/dL — ABNORMAL HIGH (ref 8–23)
CO2: 25 mmol/L (ref 22–32)
Calcium: 9.6 mg/dL (ref 8.9–10.3)
Chloride: 103 mmol/L (ref 98–111)
Creatinine, Ser: 1.2 mg/dL — ABNORMAL HIGH (ref 0.44–1.00)
GFR calc Af Amer: 46 mL/min — ABNORMAL LOW (ref >60)
GFR calc non Af Amer: 39 mL/min — ABNORMAL LOW (ref >60)
Glucose, Bld: 118 mg/dL — ABNORMAL HIGH (ref 70–99)
Potassium: 5.2 mmol/L — ABNORMAL HIGH (ref 3.5–5.1)
Sodium: 136 mmol/L (ref 135–145)
Total Bilirubin: 1.1 mg/dL (ref 0.3–1.2)
Total Protein: 7.1 g/dL (ref 6.5–8.1)

## 2020-02-19 NOTE — Progress Notes (Signed)
Primary Care Physician: Gareth Morgan, MD Referring Physician: Dr. Nona Dell    [name] is a 86 y.o. female with a h/o afib on amiodarone, moderate diastolic dysfunction, progressive valvular disease with severe MR, and mod to severe tricuspid regurgitation, with severe pulmonary HTN. Her family reported last week that they noted elevation of her heart rate and the pt mentioned that she could feel her heart racing so she was referred here.  Ekg shows afib at 113 bpm. She is here with her daughter. She is very HOH. The daughter reports that her mother does all of her own cooking and eats like a horse but despite this has lost around 80 pounds over the last year or so. Her PCP is aware.  She has continued to feel well enough to continue her cooking even with the elevation of heart rate. No other change in condition. She lives alone but several family members stop by every day. She has not noted any new LLE.   F/u in afib clinic, 02/06/20. EKG now shows atrial flutter at 79 bpm. She feels improved. Discussed undergoing cardioversion. She and her daughter are undecided. She is noting some shortness of breath.  F/u in afib clinic, 9/28. She states that she feels good and is doing  her house work and her daily cooking. She  has some shortness of breath which is chronic,  but her daughter feels she is at her baseline. Since, I increased the BB for better v rate control, she has felt better and is describing less shortness of breath. I did increase her amiodarone for around 2 weeks, but it did not convert her and the daughter and the patient do not want to cardiovert, so the dose was lowered to  previous 100 mg dose for rate control.  Today, she denies symptoms of palpitations, chest pain, shortness of breath, orthopnea, PND, lower extremity edema, dizziness, presyncope, syncope, or neurologic sequela. The patient is tolerating medications without difficulties and is otherwise without  complaint today.   Past Medical History:  Diagnosis Date  . Anxiety   . Atrial fibrillation (HCC)   . COPD (chronic obstructive pulmonary disease) (HCC)   . Depression   . Essential hypertension   . Hyperlipidemia   . Hypothyroidism   . Mitral regurgitation   . Palpitations    PAT/EAT/NSVT, normal LVEF  . Type 2 diabetes mellitus (HCC)    Past Surgical History:  Procedure Laterality Date  . 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 hiatal hernia. Antral erosions status post gastric biopsy.  . ESOPHAGOGASTRODUODENOSCOPY N/A 05/12/2016   Dr. Jena Gauss: moderate Schatzki ring s/p dilation. moderate sized hiatal hernia  . MALONEY DILATION N/A 04/17/2014   Procedure: [name] DILATION;  Surgeon: Corbin Ade, MD;  Location: [facility];  Service: Endoscopy;  Laterality: N/A;  [name] DILATION N/A 05/12/2016   Procedure: [name] DILATION;  Surgeon: Corbin Ade, MD;  Location: [facility];  Service: Endoscopy;  Laterality: N/A;  . none      Current Outpatient Medications  Medication Sig Dispense Refill  . ACCU-CHEK AVIVA PLUS test strip 1 each daily.    Marland Kitchen acetaminophen (TYLENOL) 650 MG CR tablet Take 650 mg by mouth as needed for pain.    Marland Kitchen albuterol (PROVENTIL HFA;VENTOLIN HFA) 108 (90 Base) MCG/ACT inhaler Inhale 1-2 puffs into the lungs every 4 (four) hours as needed for wheezing or shortness of  breath.    . ALPRAZolam (XANAX) 0.25 MG tablet Take 0.25 mg by mouth at bedtime as needed for anxiety.    Marland Kitchen amiodarone (PACERONE) 200 MG tablet Take 0.5 tablets (100 mg total) by mouth daily. 90 tablet 2  . atorvastatin (LIPITOR) 40 MG tablet Take 40 mg by mouth daily at 6 PM.    . bisacodyl (DULCOLAX) 5 MG EC tablet Take 5 mg by mouth daily as needed for moderate constipation.    . cetirizine (ZYRTEC) 10 MG tablet Take 5-10 mg by mouth daily as needed for allergies or rhinitis.      . cloNIDine (CATAPRES) 0.1 MG tablet TAKE 1 TABLET BY MOUTH UP TO THREE TIMES DAILY IF BLOOD PRESSURE IS GREATER THAN 140/90    . COD LIVER OIL PO Take 15 mLs by mouth every other day.     Marland Kitchen dextromethorphan 15 MG/5ML syrup Take 10 mLs by mouth 4 (four) times daily as needed for cough.    Marland Kitchen EASY COMFORT LANCETS MISC TO test blood glucose ONCE daily    . ELIQUIS 2.5 MG TABS tablet TAKE 1 TABLET BY MOUTH TWICE DAILY 180 tablet 1  . feeding supplement, ENSURE ENLIVE, (ENSURE ENLIVE) LIQD Take 237 mLs by mouth 2 (two) times daily between meals.    . furosemide (LASIX) 20 MG tablet TAKE 1 TABLET BY MOUTH EVERY DAY AS NEEDED FOR FLUID 30 tablet 6  . irbesartan (AVAPRO) 150 MG tablet Take 1 tablet (150 mg total) by mouth daily. 30 tablet 11  . Lancet Devices (ADJUSTABLE LANCING DEVICE) MISC USE TO check blood glucose    . levothyroxine (SYNTHROID, LEVOTHROID) 25 MCG tablet Take 25 mcg by mouth daily before breakfast.    . metoprolol tartrate (LOPRESSOR) 50 MG tablet Take 1.5 tablets (75 mg total) by mouth 2 (two) times daily. 90 tablet 2  . omeprazole (PRILOSEC) 20 MG capsule TAKE 1 CAPSULE BY MOUTH EVERY DAY 30 capsule 5   No current facility-administered medications for this encounter.    Allergies  Allergen Reactions  . Chocolate     Hives    Social History   Socioeconomic History  . Marital status: Married    Spouse name: Not on file  . Number of children: Not on file  . Years of education: 63  . Highest education level: Not on file  Occupational History  . Occupation: Retired    Associate Professor: RETIRED  Tobacco Use  . Smoking status: Former Games developer  . Smokeless tobacco: Never Used  Vaping Use  . Vaping Use: Never used  Substance and Sexual Activity  . Alcohol use: No    Alcohol/week: 0.0 standard drinks  . Drug use: No  . Sexual activity: Never  Other Topics Concern  . Not on file  Social History Narrative  . Not on file   Social Determinants of Health   Financial  Resource Strain:   . Difficulty of Paying Living Expenses: Not on file  Food Insecurity:   . Worried About Programme researcher, broadcasting/film/video in the Last Year: Not on file  . Ran Out of Food in the Last Year: Not on file  Transportation Needs:   . Lack of Transportation (Medical): Not on file  . Lack of Transportation (Non-Medical): Not on file  Physical Activity:   . Days of Exercise per Week: Not on file  . Minutes of Exercise per Session: Not on file  Stress:   . Feeling of Stress : Not on file  Social Connections:   .  Frequency of Communication with Friends and Family: Not on file  . Frequency of Social Gatherings with Friends and Family: Not on file  . Attends Religious Services: Not on file  . Active Member of Clubs or Organizations: Not on file  . Attends Banker Meetings: Not on file  . Marital Status: Not on file  Intimate Partner Violence:   . Fear of Current or Ex-Partner: Not on file  . Emotionally Abused: Not on file  . Physically Abused: Not on file  . Sexually Abused: Not on file    Family History  Problem Relation Age of Onset  . Heart failure Mother        Died in her 3s  . Tuberculosis Father        Died at young age  . Cancer Brother   . Arthritis Other   . Colon cancer Other     ROS- All systems are reviewed and negative except as per the HPI above  Physical Exam: Vitals:   02/19/20 0958  BP: (!) 156/76  Pulse: 100  Weight: 46.4 kg  Height: 5\' 4"  (1.626 m)   Wt Readings from Last 3 Encounters:  02/19/20 46.4 kg  02/06/20 47 kg  01/23/20 46.3 kg    Labs: Lab Results  Component Value Date   NA 138 11/19/2019   K 5.3 (H) 11/19/2019   CL 103 11/19/2019   CO2 25 11/19/2019   GLUCOSE 111 (H) 11/19/2019   BUN 27 (H) 11/19/2019   CREATININE 1.10 (H) 11/19/2019   CALCIUM 9.4 11/19/2019   PHOS 3.7 05/08/2010   MG 1.9 03/14/2016   Lab Results  Component Value Date   INR 1.18 03/05/2018   Lab Results  Component Value Date   CHOL 142  07/14/2014   HDL 58 07/14/2014   LDLCALC 78 07/14/2014   TRIG 32 07/14/2014     GEN- The patient is well appearing, alert and oriented x 3 today.   Head- normocephalic, atraumatic Eyes-  Sclera clear, conjunctiva pink Ears- hearing intact Oropharynx- clear Neck- supple, no JVP Lymph- no cervical lymphadenopathy Lungs- Clear to ausculation bilaterally, normal work of breathing Heart- irregular rate and rhythm, no murmurs, rubs or gallops, PMI not laterally displaced GI- soft, NT, ND, + BS Extremities- no clubbing, cyanosis, or edema MS- no significant deformity or atrophy Skin- no rash or lesion Psych- euthymic mood, full affect Neuro- strength and sensation are intact  EKG-atrial fib at 100 bpm, qrs int 130 nms, qtc 505 ms (pulse ox showed low 90's)    Assessment and Plan: 1. Afib  Back to  amiodarone at 100  mg daily and continues  metoprolol  At higher dose of  37.5 mg bid  She is reasonably rate controlled, she feels improved, doing all her cooking and house work without limitations  Living in rate controlled afib/flutter vrs cardioversion discussed today,she and daughter both feel she is at baseline/feels better  and do not want to pursue cardioversion    2. CHA2DS2VASc of at least 6  Continue eliquis 2.5 mg bid   3. Intermittent shortness of breath Chronic  Pending  pulmonology appointment in October   See back in one month   November C. Lupita Leash [facility] [address] [place], [place] [phone]

## 2020-02-20 ENCOUNTER — Ambulatory Visit (HOSPITAL_COMMUNITY): Payer: Medicare Other | Admitting: Nurse Practitioner

## 2020-03-11 ENCOUNTER — Encounter: Payer: Self-pay | Admitting: Internal Medicine

## 2020-03-11 ENCOUNTER — Ambulatory Visit: Payer: Medicare Other | Admitting: Internal Medicine

## 2020-03-11 ENCOUNTER — Other Ambulatory Visit: Payer: Self-pay

## 2020-03-11 DIAGNOSIS — R058 Other specified cough: Secondary | ICD-10-CM | POA: Diagnosis not present

## 2020-03-11 DIAGNOSIS — I10 Essential (primary) hypertension: Secondary | ICD-10-CM | POA: Diagnosis not present

## 2020-03-11 MED ORDER — PANTOPRAZOLE SODIUM 40 MG PO TBEC: 40.0000 mg | DELAYED_RELEASE_TABLET | Freq: Every day | ORAL | 2 refills | Status: DC

## 2020-03-11 MED ORDER — FAMOTIDINE 20 MG PO TABS: ORAL_TABLET | ORAL | 11 refills | Status: DC

## 2020-03-11 NOTE — Patient Instructions (Addendum)
No need for albuterol or incruse > the side effects are likely to be worse than the benefits.  Pantoprazole (protonix) 40 mg   Take  30-60 min before first meal of the day and Pepcid (famotidine)  20 mg one after supper >>> try   X one month to see if breathing or coughing or congestion improve and if no better after a month ok to stop  - if better ok to let Dr Sudie Bailey refill   GERD (REFLUX)  is an extremely common cause of respiratory symptoms just like yours , many times with no obvious heartburn at all.    It can be treated with medication, but also with lifestyle changes including elevation of the head of your bed (ideally with 6 -8inch blocks under the headboard of your bed),  Smoking cessation, avoidance of late meals, excessive alcohol, and avoid fatty foods, chocolate, peppermint, colas, red wine, and acidic juices such as orange juice.  NO MINT OR MENTHOL PRODUCTS SO NO COUGH DROPS  USE SUGARLESS CANDY INSTEAD (Jolley ranchers or Stover's or Life Savers) or even ice chips will also do - the key is to swallow to prevent all throat clearing. NO OIL BASED VITAMINS - use powdered substitutes.  Avoid fish oil when coughing.    Pulmonary follow up is as needed

## 2020-03-11 NOTE — Progress Notes (Signed)
[name], female    DOB: [birth year],   MRN: [id number]   Brief patient profile:  77 yobf never smoker with doe assoc with dysphagia x around 2017 prev f/b [name] self referred for cough > sob.     History of Present Illness  11/19/2019  Pulmonary/ 1st office eval/Egan Sahlin  On incruse and acei and cough drops "by the mouthful"  Chief Complaint  Patient presents with  . Pulmonary Consult    Self referral. Former patient of Dr [name]. She c/o cough with white sputum. States breathing is overall doing well. She notices SOB most first thing in the am.   Dyspnea: after breakfast gets much worse assoc with sense of choking   Cough: sporadic min productive  Sleep: 30 degrees / has cough and sob spells like choking/ something stuck in throat  SABA use:  Lots of cough drops    Very confused with details of care and is in charge of her own "bag of pills" per daughter who is concerned she's on way too much meds rec Stop incruse and lisinopril  gerd diet   Only use your albuterol as a rescue medication   Take avapro 150 mg one daily instead of lisinopril  - break it in half if too strong    01/03/2020  f/u ov/[facility] re: cough/ sob improved  Chief Complaint  Patient presents with  . Follow-up    non productive cough  Dyspnea:  Improved  Cough: helps to sit up at an angle at night / sometimes coughs p meals / mostly dry though  Sleeping: ok  SABA use: less  02: none  rec Return prn with all meds  02/06/20 Afib clinic    03/11/2020  f/u ov/Shanti Eichel re: chonic  Cough sometimes with meals, want incruse back but doing bett off all inhalers Chief Complaint  Patient presents with  . Follow-up    Cough has improved since her last visit.   Dyspnea:  MMRC3 = can't walk 100 yards even at a slow pace at a flat grade s stopping due to sob   Cough: day > noct, sometimes p meals  Sleeping: flat bed/ one pillow SABA use: none  02: none    No obvious day to day or daytime variability or assoc  excess/ purulent sputum or mucus plugs or hemoptysis or cp or chest tightness, subjective wheeze or overt sinus or hb symptoms.   Sleeping  without nocturnal  or early am exacerbation  of respiratory  c/o's or need for noct saba. Also denies any obvious fluctuation of symptoms with weather or environmental changes or other aggravating or alleviating factors except as outlined above   No unusual exposure hx or h/o childhood pna/ asthma or knowledge of premature birth.  Current Allergies, Complete Past Medical History, Past Surgical History, Family History, and Social History were reviewed in [employer] record.  ROS  The following are not active complaints unless bolded Hoarseness, sore throat, dysphagia, dental problems, itching, sneezing,  nasal congestion or discharge of excess mucus or purulent secretions, ear ache,   fever, chills, sweats, unintended wt loss or wt gain, classically pleuritic or exertional cp,  orthopnea pnd or arm/hand swelling  or leg swelling, presyncope, palpitations, abdominal pain, anorexia, nausea, vomiting, diarrhea  or change in bowel habits or change in bladder habits, change in stools or change in urine, dysuria, hematuria,  rash, arthralgias, visual complaints, headache, numbness, weakness or ataxia or problems with walking or coordination,  change  in mood or  memory.        Current Meds  Medication Sig  . ACCU-CHEK AVIVA PLUS test strip 1 each daily.  Marland Kitchen acetaminophen (TYLENOL) 650 MG CR tablet Take 650 mg by mouth as needed for pain.  Marland Kitchen albuterol (PROVENTIL HFA;VENTOLIN HFA) 108 (90 Base) MCG/ACT inhaler Inhale 1-2 puffs into the lungs every 4 (four) hours as needed for wheezing or shortness of breath.  . ALPRAZolam (XANAX) 0.25 MG tablet Take 0.25 mg by mouth at bedtime as needed for anxiety.  Marland Kitchen amiodarone (PACERONE) 200 MG tablet Take 0.5 tablets (100 mg total) by mouth daily.  Marland Kitchen atorvastatin (LIPITOR) 40 MG tablet Take 40 mg by mouth daily  at 6 PM.  . bisacodyl (DULCOLAX) 5 MG EC tablet Take 5 mg by mouth daily as needed for moderate constipation.  . cetirizine (ZYRTEC) 10 MG tablet Take 5-10 mg by mouth daily as needed for allergies or rhinitis.   . cloNIDine (CATAPRES) 0.1 MG tablet TAKE 1 TABLET BY MOUTH UP TO THREE TIMES DAILY IF BLOOD PRESSURE IS GREATER THAN 140/90  . COD LIVER OIL PO Take 15 mLs by mouth every other day.   Marland Kitchen dextromethorphan 15 MG/5ML syrup Take 10 mLs by mouth 4 (four) times daily as needed for cough.  Marland Kitchen EASY COMFORT LANCETS MISC TO test blood glucose ONCE daily  . ELIQUIS 2.5 MG TABS tablet TAKE 1 TABLET BY MOUTH TWICE DAILY  . irbesartan (AVAPRO) 150 MG tablet Take 1 tablet (150 mg total) by mouth daily.  Demetra Shiner Devices (ADJUSTABLE LANCING DEVICE) MISC USE TO check blood glucose  . levothyroxine (SYNTHROID, LEVOTHROID) 25 MCG tablet Take 25 mcg by mouth daily before breakfast.  . metoprolol tartrate (LOPRESSOR) 50 MG tablet Take 1.5 tablets (75 mg total) by mouth 2 (two) times daily.  Marland Kitchen spironolactone (ALDACTONE) 25 MG tablet Take 25 mg by mouth daily.                     Past Medical History:  Diagnosis Date  . Anxiety   . Atrial fibrillation (HCC)   . COPD (chronic obstructive pulmonary disease) (HCC)   . Depression   . Essential hypertension   . Hyperlipidemia   . Hypothyroidism   . Mitral regurgitation   . Palpitations    PAT/EAT/NSVT, normal LVEF  . Type 2 diabetes mellitus (HCC)       Objective:    03/11/2020      103   01/03/20 103 lb 12.8 oz (47.1 kg)  11/19/19 104 lb 8 oz (47.4 kg)  10/02/19 109 lb 9.6 oz (49.7 kg)    Vital signs reviewed  03/11/2020  - Note at rest 02 sats  100% on RA   amb frail bf needs help standing from chair   HEENT : pt wearing mask not removed for exam due to covid -19 concerns.    NECK :  without JVD/Nodes/TM/ nl carotid upstrokes bilaterally   LUNGS: no acc muscle use,  Nl contour chest which is clear to A and P bilaterally without  cough on insp or exp maneuvers   CV:  RRR  no s3 or murmur or increase in P2, and no edema   ABD:  soft and nontender with nl inspiratory excursion in the supine position. No bruits or organomegaly appreciated, bowel sounds nl  MS:  Nl gait/ ext warm without deformities, calf tenderness, cyanosis or clubbing No obvious joint restrictions   SKIN: warm and dry without lesions  NEURO:  alert, approp,  no motor or cerebellar deficits apparent.            ,  Mildly kyphotic chest with distant bs bilaterally    CV:  RRR  no s3  2/6 HSM or increase in P2, and trace bilaterally sym LE pitting edema           Assessment

## 2020-03-11 NOTE — Assessment & Plan Note (Addendum)
Onset around 2017 - d/c acei and incruse  11/19/2019 > improved 01/03/2020 but still perceived need for saba  03/11/2020  After extensive coaching inhaler device,  effectiveness =    0%  (consistently sprays in mouth and then exhales  Upper airway cough syndrome (previously labeled PNDS),  is so named because it's frequently impossible to sort out how much is  CR/sinusitis with freq throat clearing (which can be related to primary GERD)   vs  causing  secondary (" extra esophageal")  GERD from wide swings in gastric pressure that occur with throat clearing, often  promoting self use of mint and menthol lozenges that reduce the lower esophageal sphincter tone and exacerbate the problem further in a cyclical fashion.   These are the same pts (now being labeled as having "irritable larynx syndrome" by some cough centers) who not infrequently have a history of having failed to tolerate ace inhibitors,  dry powder inhalers (both may well apply here)  or biphosphonates or report having atypical/extraesophageal reflux symptoms that don't respond to standard doses of PPI  and are easily confused as having aecopd or asthma flares by even experienced allergists/ pulmonologists (myself included).   rec trial of max rx for gerd x one month to see if perceived need for saba resolves and f/u here can be prn as I don't detect a true pulmonary problem         Each maintenance medication was reviewed in detail including emphasizing most importantly the difference between maintenance and prns and under what circumstances the prns are to be triggered using an action plan format where appropriate.  Total time for H and P, chart review, counseling, teaching device and generating customized AVS unique to this office visit / charting = 38 min

## 2020-03-11 NOTE — Assessment & Plan Note (Signed)
D/C ACEi and K  11/19/2019 > replace with Avapro 150 mg one daily > bp too low even on 75 mg / day so pt d/c'd 12/2019 > improved  03/11/2020  Although even in retrospect it may not be clear the ACEi contributed to the pt's symptoms,  Pt improved off them and adding them back at this point or in the future would risk confusion in interpretation of non-specific respiratory symptoms to which this patient is prone  ie  Better not to muddy the waters here.   >>> advised re fda warning on generic avapro but daughter reluctant to change rx at present even with reported  risk of carcinogens' = adverse effects and will address further options with pcp

## 2020-03-18 ENCOUNTER — Encounter (HOSPITAL_COMMUNITY): Payer: Self-pay | Admitting: Nurse Practitioner

## 2020-03-18 ENCOUNTER — Other Ambulatory Visit: Payer: Self-pay

## 2020-03-18 ENCOUNTER — Ambulatory Visit (HOSPITAL_COMMUNITY)
Admission: RE | Admit: 2020-03-18 | Discharge: 2020-03-18 | Disposition: A | Discharge status: Home / Self Care | Payer: Medicare Other | Source: Ambulatory Visit | Attending: Nurse Practitioner | Admitting: Nurse Practitioner

## 2020-03-18 VITALS — BP 128/60 | HR 83 | Ht 64.0 in | Wt 105.0 lb

## 2020-03-18 DIAGNOSIS — R0602 Shortness of breath: Secondary | ICD-10-CM | POA: Diagnosis not present

## 2020-03-18 DIAGNOSIS — Z7901 Long term (current) use of anticoagulants: Secondary | ICD-10-CM | POA: Diagnosis not present

## 2020-03-18 DIAGNOSIS — I4891 Unspecified atrial fibrillation: Secondary | ICD-10-CM

## 2020-03-18 DIAGNOSIS — I4819 Other persistent atrial fibrillation: Secondary | ICD-10-CM

## 2020-03-18 DIAGNOSIS — D6869 Other thrombophilia: Secondary | ICD-10-CM

## 2020-03-18 DIAGNOSIS — Z87891 Personal history of nicotine dependence: Secondary | ICD-10-CM | POA: Insufficient documentation

## 2020-03-18 DIAGNOSIS — I4892 Unspecified atrial flutter: Secondary | ICD-10-CM | POA: Diagnosis not present

## 2020-03-18 DIAGNOSIS — Z8249 Family history of ischemic heart disease and other diseases of the circulatory system: Secondary | ICD-10-CM | POA: Insufficient documentation

## 2020-03-18 DIAGNOSIS — Z79899 Other long term (current) drug therapy: Secondary | ICD-10-CM | POA: Diagnosis not present

## 2020-03-18 NOTE — Progress Notes (Signed)
Primary Care Physician: Gareth Morgan, MD Referring Physician: Dr. Nona Dell    [name] is a 86 y.o. female with a h/o afib on amiodarone, moderate diastolic dysfunction, progressive valvular disease with severe MR, and mod to severe tricuspid regurgitation, with severe pulmonary HTN. Her family reported last week that they noted elevation of her heart rate and the pt mentioned that she could feel her heart racing so she was referred here.  Ekg shows afib at 113 bpm. She is here with her daughter. She is very HOH. The daughter reports that her mother does all of her own cooking and eats like a horse but despite this has lost around 80 pounds over the last year or so. Her PCP is aware.  She has continued to feel well enough to continue her cooking even with the elevation of heart rate. No other change in condition. She lives alone but several family members stop by every day. She has not noted any new LLE.   F/u in afib clinic, 02/06/20. EKG now shows atrial flutter at 79 bpm. She feels improved. Discussed undergoing cardioversion. She and her daughter are undecided. She is noting some shortness of breath.  F/u in afib clinic, 9/28. She states that she feels good and is doing  her house work and her daily cooking. She  has some shortness of breath which is chronic,  but her daughter feels she is at her baseline. Since, I increased the BB for better v rate control, she has felt better and is describing less shortness of breath. I did increase her amiodarone for around 2 weeks, but it did not convert her and the daughter and the patient do not want to cardiovert, so the dose was lowered to  previous 100 mg dose for rate control.  F/u in afib clinic, 03/18/20. She continues in rate controlled atrial flutter. She  feels well. She  feels she is at her baseline. Can still do her housework and daily cooking without any symptoms of excessive fatigue and/or shortness of breath. Saw Dr. Sherene Sires  recently for cough. He stopped ACE, started avapro, and put her on  PPI and PM Pepcid. She feels improved on this. The daughter and the pt had not wanted to pursue cardioversion in the past and they continue with this decision today. She appears to be well rate controlled with increase of BB on my initial  visit and 100 mg amiodarone.   Today, she denies symptoms of palpitations, chest pain, shortness of breath, orthopnea, PND, lower extremity edema, dizziness, presyncope, syncope, or neurologic sequela. The patient is tolerating medications without difficulties and is otherwise without complaint today.   Past Medical History:  Diagnosis Date  . Anxiety   . Atrial fibrillation (HCC)   . COPD (chronic obstructive pulmonary disease) (HCC)   . Depression   . Essential hypertension   . Hyperlipidemia   . Hypothyroidism   . Mitral regurgitation   . Palpitations    PAT/EAT/NSVT, normal LVEF  . Type 2 diabetes mellitus (HCC)    Past Surgical History:  Procedure Laterality Date  . 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 hiatal hernia. Antral erosions status post gastric biopsy.  . ESOPHAGOGASTRODUODENOSCOPY N/A 05/12/2016   Dr. Jena Gauss: moderate Schatzki ring s/p dilation. moderate sized hiatal hernia  . MALONEY DILATION N/A 04/17/2014   Procedure: [name] DILATION;  Surgeon: Corbin Ade, MD;  [facility];  Service: Endoscopy;  Laterality: N/A;  [name] DILATION N/A 05/12/2016   Procedure: [name] DILATION;  Surgeon: Corbin Ade, MD;  Location: [facility];  Service: Endoscopy;  Laterality: N/A;  . none      Current Outpatient Medications  Medication Sig Dispense Refill  . ACCU-CHEK AVIVA PLUS test strip 1 each daily.    Marland Kitchen acetaminophen (TYLENOL) 650 MG CR tablet Take 650 mg by mouth as needed for pain.    Marland Kitchen albuterol (PROVENTIL HFA;VENTOLIN HFA) 108 (90 Base)  MCG/ACT inhaler Inhale 1-2 puffs into the lungs every 4 (four) hours as needed for wheezing or shortness of breath.    . ALPRAZolam (XANAX) 0.25 MG tablet Take 0.25 mg by mouth at bedtime as needed for anxiety.    Marland Kitchen amiodarone (PACERONE) 200 MG tablet Take 0.5 tablets (100 mg total) by mouth daily. 90 tablet 2  . atorvastatin (LIPITOR) 40 MG tablet Take 40 mg by mouth daily at 6 PM.    . bisacodyl (DULCOLAX) 5 MG EC tablet Take 5 mg by mouth daily as needed for moderate constipation.    . cetirizine (ZYRTEC) 10 MG tablet Take 5-10 mg by mouth daily as needed for allergies or rhinitis.     . cloNIDine (CATAPRES) 0.1 MG tablet TAKE 1 TABLET BY MOUTH UP TO THREE TIMES DAILY IF BLOOD PRESSURE IS GREATER THAN 140/90    . COD LIVER OIL PO Take 15 mLs by mouth every other day.     Marland Kitchen EASY COMFORT LANCETS MISC TO test blood glucose ONCE daily    . ELIQUIS 2.5 MG TABS tablet TAKE 1 TABLET BY MOUTH TWICE DAILY 180 tablet 1  . famotidine (PEPCID) 20 MG tablet One after supper 30 tablet 11  . irbesartan (AVAPRO) 150 MG tablet Take 1 tablet (150 mg total) by mouth daily. 30 tablet 11  . Lancet Devices (ADJUSTABLE LANCING DEVICE) MISC USE TO check blood glucose    . levothyroxine (SYNTHROID, LEVOTHROID) 25 MCG tablet Take 25 mcg by mouth daily before breakfast.    . metoprolol tartrate (LOPRESSOR) 50 MG tablet Take 1.5 tablets (75 mg total) by mouth 2 (two) times daily. 90 tablet 2  . pantoprazole (PROTONIX) 40 MG tablet Take 1 tablet (40 mg total) by mouth daily. Take 30-60 min before first meal of the day 30 tablet 2  . spironolactone (ALDACTONE) 25 MG tablet Take 25 mg by mouth daily.    Marland Kitchen dextromethorphan 15 MG/5ML syrup Take 10 mLs by mouth 4 (four) times daily as needed for cough. (Patient not taking: Reported on 03/18/2020)     No current facility-administered medications for this encounter.    Allergies  Allergen Reactions  . Chocolate     Hives    Social History   Socioeconomic History  .  Marital status: Married    Spouse name: Not on file  . Number of children: Not on file  . Years of education: 41  . Highest education level: Not on file  Occupational History  . Occupation: Retired    Associate Professor: RETIRED  Tobacco Use  . Smoking status: Former Games developer  . Smokeless tobacco: Never Used  Vaping Use  . Vaping Use: Never used  Substance and Sexual Activity  . Alcohol use: No    Alcohol/week: 0.0 standard drinks  . Drug use: No  . Sexual activity: Never  Other Topics Concern  . Not on file  Social History Narrative  . Not on file   Social Determinants  of Health   Financial Resource Strain:   . Difficulty of Paying Living Expenses: Not on file  Food Insecurity:   . Worried About Programme researcher, broadcasting/film/videounning Out of Food in the Last Year: Not on file  . Ran Out of Food in the Last Year: Not on file  Transportation Needs:   . Lack of Transportation (Medical): Not on file  . Lack of Transportation (Non-Medical): Not on file  Physical Activity:   . Days of Exercise per Week: Not on file  . Minutes of Exercise per Session: Not on file  Stress:   . Feeling of Stress : Not on file  Social Connections:   . Frequency of Communication with Friends and Family: Not on file  . Frequency of Social Gatherings with Friends and Family: Not on file  . Attends Religious Services: Not on file  . Active Member of Clubs or Organizations: Not on file  . Attends BankerClub or Organization Meetings: Not on file  . Marital Status: Not on file  Intimate Partner Violence:   . Fear of Current or Ex-Partner: Not on file  . Emotionally Abused: Not on file  . Physically Abused: Not on file  . Sexually Abused: Not on file    Family History  Problem Relation Age of Onset  . Heart failure Mother        Died in her 1770s  . Tuberculosis Father        Died at young age  . Cancer Brother   . Arthritis Other   . Colon cancer Other     ROS- All systems are reviewed and negative except as per the HPI above  Physical  Exam: Vitals:   03/18/20 1148  BP: 128/60  Pulse: 83  Weight: 47.6 kg  Height: 5\' 4"  (1.626 m)   Wt Readings from Last 3 Encounters:  03/18/20 47.6 kg  03/11/20 46.7 kg  02/19/20 46.4 kg    Labs: Lab Results  Component Value Date   NA 136 02/19/2020   K 5.2 (H) 02/19/2020   CL 103 02/19/2020   CO2 25 02/19/2020   GLUCOSE 118 (H) 02/19/2020   BUN 24 (H) 02/19/2020   CREATININE 1.20 (H) 02/19/2020   CALCIUM 9.6 02/19/2020   PHOS 3.7 05/08/2010   MG 1.9 03/14/2016   Lab Results  Component Value Date   INR 1.18 03/05/2018   Lab Results  Component Value Date   CHOL 142 07/14/2014   HDL 58 07/14/2014   LDLCALC 78 07/14/2014   TRIG 32 07/14/2014     GEN- The patient is well appearing, alert and oriented x 3 today.   Head- normocephalic, atraumatic Eyes-  Sclera clear, conjunctiva pink Ears- hearing intact Oropharynx- clear Neck- supple, no JVP Lymph- no cervical lymphadenopathy Lungs- Clear to ausculation bilaterally, normal work of breathing Heart- irregular rate and rhythm, no murmurs, rubs or gallops, PMI not laterally displaced GI- soft, NT, ND, + BS Extremities- no clubbing, cyanosis, or edema MS- no significant deformity or atrophy Skin- no rash or lesion Psych- euthymic mood, full affect Neuro- strength and sensation are intact  EKG-atrial flutter at 83 bpm, pr int 170 ms, qrs int 126 ms, qtc 472 ms     Assessment and Plan: 1. Afib/flutter  In atrial flutter today at 83 bpm She appears to be asymptomatic with this  Continue amiodarone at 100  mg daily and continue  metoprolol at higher dose of  37.5 mg bid  She is rate controlled, she feels improved, doing  all her cooking and house work without limitations  Living in rate controlled afib/flutter vrs cardioversion discussed today,she and daughter both feel she is at baseline/feels better  and do not want to pursue cardioversion    2. CHA2DS2VASc of at least 6  Continue eliquis 2.5 mg bid   3.  Intermittent shortness of breath Mostly when she first gets up, then resolves Cough was thought to be from  GERD/Ace when she saw Dr. Sherene Sires Now off ACE, on ARB, on PPE and Pecid and cough has improved   F/u with Dr. Diona Browner in November  I will see back this delightful lady as needed   Lupita Leash [facility] [address] [place], [place] [address] [phone]

## 2020-03-20 ENCOUNTER — Other Ambulatory Visit: Payer: Self-pay | Admitting: Cardiology

## 2020-03-20 ENCOUNTER — Other Ambulatory Visit (HOSPITAL_COMMUNITY): Payer: Self-pay | Admitting: Nurse Practitioner

## 2020-03-31 ENCOUNTER — Other Ambulatory Visit: Payer: Self-pay | Admitting: Internal Medicine

## 2020-04-14 ENCOUNTER — Ambulatory Visit: Payer: Medicare Other | Admitting: Cardiology

## 2020-04-14 ENCOUNTER — Encounter: Payer: Self-pay | Admitting: Cardiology

## 2020-04-14 VITALS — BP 102/78 | HR 80 | Ht 64.0 in | Wt 103.0 lb

## 2020-04-14 DIAGNOSIS — I484 Atypical atrial flutter: Secondary | ICD-10-CM

## 2020-04-14 DIAGNOSIS — I10 Essential (primary) hypertension: Secondary | ICD-10-CM

## 2020-04-14 DIAGNOSIS — I38 Endocarditis, valve unspecified: Secondary | ICD-10-CM | POA: Diagnosis not present

## 2020-04-14 NOTE — Progress Notes (Signed)
Cardiology Office Note  Date: 04/14/2020   ID: [name], DOB 09-24-1926, MRN [id number]  PCP:  Gareth Morgan, MD  Cardiologist:  Nona Dell, MD Electrophysiologist:  None   Chief Complaint  Patient presents with   Cardiac follow-up    History of Present Illness: [name] is a 86 y.o. female last seen in May.  She has had interval follow-up in the atrial fibrillation clinic, I reviewed the notes.  She is here today with a family member for a routine visit.  She tells her that she has been doing relatively well, no palpitations or progressive sense of shortness of breath with ADLs.  She still cooks and lives in her own home.  TSH and LFTs were normal in September.  I personally reviewed her ECG today which shows atypical atrial flutter versus atrial tachycardia with 2:1 block, IVCD.  I reviewed her medications which are outlined below and stable from a cardiac perspective.  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 hiatal hernia. Antral erosions status post gastric biopsy.   ESOPHAGOGASTRODUODENOSCOPY N/A 05/12/2016   Dr. Jena Gauss: moderate Schatzki ring s/p dilation. moderate sized hiatal hernia   MALONEY DILATION N/A 04/17/2014   Procedure: [name] DILATION;  Surgeon: Corbin Ade, MD;  [facility];  Service: Endoscopy;  Laterality: N/A;   MALONEY DILATION N/A 05/12/2016   Procedure: [name] DILATION;  Surgeon: Corbin Ade, MD;  [facility];   Service: Endoscopy;  Laterality: N/A;   none      Current Outpatient Medications  Medication Sig Dispense Refill   ACCU-CHEK AVIVA PLUS test strip 1 each daily.     acetaminophen (TYLENOL) 650 MG CR tablet Take 650 mg by mouth as needed for pain.     albuterol (PROVENTIL HFA;VENTOLIN HFA) 108 (90 Base) MCG/ACT inhaler Inhale 1-2 puffs into the lungs every 4 (four) hours as needed for wheezing or shortness of breath.     ALPRAZolam (XANAX) 0.25 MG tablet Take 0.25 mg by mouth at bedtime as needed for anxiety.     amiodarone (PACERONE) 200 MG tablet Take 0.5 tablets (100 mg total) by mouth daily. 90 tablet 2   atorvastatin (LIPITOR) 40 MG tablet Take 40 mg by mouth daily at 6 PM.     bisacodyl (DULCOLAX) 5 MG EC tablet Take 5 mg by mouth daily as needed for moderate constipation.     cetirizine (ZYRTEC) 10 MG tablet Take 5-10 mg by mouth daily as needed for allergies or rhinitis.      cloNIDine (CATAPRES) 0.1 MG tablet TAKE 1 TABLET BY MOUTH UP TO THREE TIMES DAILY IF BLOOD PRESSURE IS GREATER THAN 140/90     COD LIVER OIL PO Take 15 mLs by mouth every other day.      dextromethorphan 15 MG/5ML syrup Take 10 mLs by mouth 4 (four) times daily as needed for cough.      EASY COMFORT LANCETS MISC TO test blood glucose ONCE  daily     ELIQUIS 2.5 MG TABS tablet TAKE 1 TABLET BY MOUTH TWICE DAILY 180 tablet 1   famotidine (PEPCID) 20 MG tablet One after supper 30 tablet 11   furosemide (LASIX) 20 MG tablet Take 20 mg by mouth.     irbesartan (AVAPRO) 150 MG tablet Take 1 tablet (150 mg total) by mouth daily. 30 tablet 11   Lancet Devices (ADJUSTABLE LANCING DEVICE) MISC USE TO check blood glucose     levothyroxine (SYNTHROID, LEVOTHROID) 25 MCG tablet Take 25 mcg by mouth daily before breakfast.     metoprolol tartrate (LOPRESSOR) 50 MG tablet TAKE 1 AND 1/2 TABLETS BY MOUTH TWICE DAILY 90 tablet 6   pantoprazole (PROTONIX) 40 MG tablet Take 1 tablet (40 mg total) by mouth daily.  Take 30-60 min before first meal of the day 30 tablet 2   spironolactone (ALDACTONE) 25 MG tablet Take 25 mg by mouth daily.     No current facility-administered medications for this visit.   Allergies:  Chocolate   ROS: Chronic hearing loss.  Physical Exam: VS:  BP 102/78    Pulse 80    Ht 5\' 4"  (1.626 m)    Wt 103 lb (46.7 kg)    SpO2 99%    BMI 17.68 kg/m , BMI Body mass index is 17.68 kg/m.  Wt Readings from Last 3 Encounters:  04/14/20 103 lb (46.7 kg)  03/18/20 105 lb (47.6 kg)  03/11/20 103 lb (46.7 kg)    General: Elderly woman, appears comfortable at rest. HEENT: Conjunctiva and lids normal, wearing a mask. Neck: Supple, no elevated JVP or carotid bruits, no thyromegaly. Lungs: Clear to auscultation, nonlabored breathing at rest. Cardiac: Regular rate and rhythm, no S3, apical systolic murmur, no pericardial rub. Extremities: No pitting edema.  ECG:  An ECG dated 02/19/2020 was personally reviewed today and demonstrated:  Probable atypical atrial flutter with variable block, IVCD and diffuse nonspecific ST-T wave abnormalities.  Recent Labwork: 11/19/2019: B Natriuretic Peptide 233.0; Hemoglobin 10.5; Platelets 223 02/19/2020: ALT 22; AST 28; BUN 24; Creatinine, Ser 1.20; Potassium 5.2; Sodium 136; TSH 3.822     Component Value Date/Time   CHOL 142 07/14/2014 0456   TRIG 32 07/14/2014 0456   HDL 58 07/14/2014 0456   CHOLHDL 2.4 07/14/2014 0456   VLDL 6 07/14/2014 0456   LDLCALC 78 07/14/2014 0456    Other Studies Reviewed Today:  Echocardiogram 09/27/2019: 1. Left ventricular ejection fraction, by estimation, is 50 to 55%. The  left ventricle has low normal function. The left ventricle has no regional  wall motion abnormalities. Left ventricular diastolic parameters are  consistent with Grade II diastolic  dysfunction (pseudonormalization). Elevated left atrial pressure.  2. Right ventricular systolic function is low normal. The right  ventricular size is  mildly enlarged. There is severely elevated pulmonary  artery systolic pressure.  3. Left atrial size was severely dilated.  4. Right atrial size was mildly dilated.  5. The mitral valve is degenerative. Severe mitral valve regurgitation.  6. The tricuspid valve is degenerative. Tricuspid valve regurgitation is  moderate to severe.  7. The aortic valve is tricuspid. Aortic valve regurgitation is mild.  Mild aortic valve sclerosis is present, with no evidence of aortic valve  stenosis.  8. The inferior vena cava is dilated in size with >50% respiratory  variability, suggesting right atrial pressure of 8 mmHg.   Assessment and Plan:  1.  Paroxysmal to persistent atrial fibrillation and atypical atrial flutter, currently  doing well in terms of symptom control.  CHA2DS2-VASc score is at least 6.  She continues on Eliquis for stroke prophylaxis, no reported bleeding problems.  With low-dose amiodarone and Lopressor heart rate is well controlled.  No changes were made today.  2.  Valvular heart disease including severe mitral regurgitation and moderate to severe tricuspid regurgitation.  Continuing conservative management.  No evidence of fluid overload and weight is stable.  3.  History of essential hypertension.  Blood pressure is normal today.  She continues on clonidine, Avapro, and Aldactone.  Medication Adjustments/Labs and Tests Ordered: Current medicines are reviewed at length with the patient today.  Concerns regarding medicines are outlined above.   Tests Ordered: Orders Placed This Encounter  Procedures   EKG 12-Lead    Medication Changes: No orders of the defined types were placed in this encounter.   Disposition:  Follow up 6 months in the [place] office.  Signed, Jonelle Sidle, MD, [facility] 04/14/2020 1:34 PM    [facility] [address] [place], [place], [place] [address] Phone: [phone]; Fax: [phone]

## 2020-04-14 NOTE — Patient Instructions (Addendum)

## 2020-05-12 ENCOUNTER — Telehealth: Payer: Self-pay | Admitting: Internal Medicine

## 2020-05-12 MED ORDER — ALBUTEROL SULFATE HFA 108 (90 BASE) MCG/ACT IN AERS: 1.0000 | INHALATION_SPRAY | RESPIRATORY_TRACT | 0 refills | Status: AC | PRN

## 2020-05-12 NOTE — Telephone Encounter (Signed)
Spoke with the pt  [name] states that [name] needs refill on her albuterol inhaler  [name] has continued to use this occ since last ov for SOB that occurs sometimes when [name] wakes in the am  [name] has stopped the pantoprazole and pepcid b/c "it made my stomach upset"- better off meds  Please advise if okay to refill her albuterol inhaler, thanks   Last ov AVS:  Office Visit  03/11/2020 [facility]   Nyoka Cowden, MD   Pulmonary Disease  Upper airway cough syndrome +1 more   Dx  Follow-up ; Referred by Gareth Morgan, MD   Reason for Visit    Additional Documentation  Vitals:  BP 140/74 (BP Location: Left Arm, Cuff Size: Normal)   Pulse 70   Temp 97 F (36.1 C) Important   (Temporal)   Ht 5\' 4"  (1.626 m)   Wt 103 lb (46.7 kg)   SpO2 100%    BMI 17.68 kg/m   BSA 1.45 m       More Vitals   Flowsheets:  Extended Vitals,   NEWS,   MEWS Score,   Anthropometrics,   Method of Visit     Encounter Info:  Billing Info,   History,   Allergies,   Detailed Report      Orthostatic Vitals Recorded in This Encounter   03/11/2020  1145     BP Location: Left Arm  Cuff Size: Normal   All Notes    Progress Notes by 03/13/2020, MD at 03/11/2020 11:15 AM  Author: 03/13/2020, MD Author Type: Physician Filed: 03/11/2020  2:13 PM  Note Status: Signed Cosign: Cosign Not Required Encounter Date: 03/11/2020  Editor: 03/13/2020, MD (Physician)             Expand AllCollapse All       Nyoka Cowden, female    DOB: 30-Mar-1927,   MRN: [id number]     Brief patient profile:  9 yobf never smoker with doe assoc with dysphagia x around 2017 prev f/b [name] self referred for cough > sob.        History of Present Illness  11/19/2019  Pulmonary/ 1st office eval/[facility]  On incruse and acei and cough drops "by the mouthful"      Chief Complaint  Patient presents with   Pulmonary Consult      Self referral.  Former patient of Dr 11/21/2019. [name] c/o cough with white sputum. States breathing is overall doing well. [name] notices SOB most first thing in the am.   Dyspnea: after breakfast gets much worse assoc with sense of choking   Cough: sporadic min productive  Sleep: 30 degrees / has cough and sob spells like choking/ something stuck in throat  SABA use:  Lots of cough drops    Very confused with details of care and is in charge of her own "bag of pills" per daughter who is concerned [name]'s on way too much meds rec Stop incruse and lisinopril  gerd diet   Only use your albuterol as a rescue medication   Take avapro 150 mg one daily instead of lisinopril  - break it in half if too strong       01/03/2020  f/u ov/Wert re: cough/ sob improved      Chief Complaint  Patient presents with   Follow-up      non productive cough  Dyspnea:  Improved  Cough: helps to sit up at an angle at  night / sometimes coughs p meals / mostly dry though  Sleeping: ok  SABA use: less  02: none  rec Return prn with all meds   02/06/20 [facility]      03/11/2020  f/u ov/Wert re: chonic  Cough sometimes with meals, want incruse back but doing bett off all inhalers     Chief Complaint  Patient presents with   Follow-up      Cough has improved since her last visit.   Dyspnea:  MMRC3 = can't walk 100 yards even at a slow pace at a flat grade s stopping due to sob   Cough: day > noct, sometimes p meals  Sleeping: flat bed/ one pillow SABA use: none  02: none      No obvious day to day or daytime variability or assoc excess/ purulent sputum or mucus plugs or hemoptysis or cp or chest tightness, subjective wheeze or overt sinus or hb symptoms.    Sleeping  without nocturnal  or early am exacerbation  of respiratory  c/o's or need for noct saba. Also denies any obvious fluctuation of symptoms with weather or environmental changes or other aggravating or alleviating factors except as outlined above    No unusual  exposure hx or h/o childhood pna/ asthma or knowledge of premature birth.   Current Allergies, Complete Past Medical History, Past Surgical History, Family History, and Social History were reviewed in [facility] electronic medical record.   ROS  The following are not active complaints unless bolded Hoarseness, sore throat, dysphagia, dental problems, itching, sneezing,  nasal congestion or discharge of excess mucus or purulent secretions, ear ache,   fever, chills, sweats, unintended wt loss or wt gain, classically pleuritic or exertional cp,  orthopnea pnd or arm/hand swelling  or leg swelling, presyncope, palpitations, abdominal pain, anorexia, nausea, vomiting, diarrhea  or change in bowel habits or change in bladder habits, change in stools or change in urine, dysuria, hematuria,  rash, arthralgias, visual complaints, headache, numbness, weakness or ataxia or problems with walking or coordination,  change in mood or  memory.         Active Medications      Current Meds  Medication Sig   ACCU-CHEK AVIVA PLUS test strip 1 each daily.   acetaminophen (TYLENOL) 650 MG CR tablet Take 650 mg by mouth as needed for pain.   albuterol (PROVENTIL HFA;VENTOLIN HFA) 108 (90 Base) MCG/ACT inhaler Inhale 1-2 puffs into the lungs every 4 (four) hours as needed for wheezing or shortness of breath.   ALPRAZolam (XANAX) 0.25 MG tablet Take 0.25 mg by mouth at bedtime as needed for anxiety.   amiodarone (PACERONE) 200 MG tablet Take 0.5 tablets (100 mg total) by mouth daily.   atorvastatin (LIPITOR) 40 MG tablet Take 40 mg by mouth daily at 6 PM.   bisacodyl (DULCOLAX) 5 MG EC tablet Take 5 mg by mouth daily as needed for moderate constipation.   cetirizine (ZYRTEC) 10 MG tablet Take 5-10 mg by mouth daily as needed for allergies or rhinitis.    cloNIDine (CATAPRES) 0.1 MG tablet TAKE 1 TABLET BY MOUTH UP TO THREE TIMES DAILY IF BLOOD PRESSURE IS GREATER THAN 140/90   COD LIVER OIL PO Take 15  mLs by mouth every other day.    dextromethorphan 15 MG/5ML syrup Take 10 mLs by mouth 4 (four) times daily as needed for cough.   EASY COMFORT LANCETS MISC TO test blood glucose ONCE daily   ELIQUIS 2.5 MG  TABS tablet TAKE 1 TABLET BY MOUTH TWICE DAILY   irbesartan (AVAPRO) 150 MG tablet Take 1 tablet (150 mg total) by mouth daily.   Lancet Devices (ADJUSTABLE LANCING DEVICE) MISC USE TO check blood glucose   levothyroxine (SYNTHROID, LEVOTHROID) 25 MCG tablet Take 25 mcg by mouth daily before breakfast.   metoprolol tartrate (LOPRESSOR) 50 MG tablet Take 1.5 tablets (75 mg total) by mouth 2 (two) times daily.   spironolactone (ALDACTONE) 25 MG tablet Take 25 mg by mouth daily.                              Past Medical History:  Diagnosis Date   Anxiety     Atrial fibrillation (HCC)     COPD (chronic obstructive pulmonary disease) (HCC)     Depression     Essential hypertension     Hyperlipidemia     Hypothyroidism     Mitral regurgitation     Palpitations      PAT/EAT/NSVT, normal LVEF   Type 2 diabetes mellitus (HCC)          Objective:       03/11/2020      103   01/03/20 103 lb 12.8 oz (47.1 kg)  11/19/19 104 lb 8 oz (47.4 kg)  10/02/19 109 lb 9.6 oz (49.7 kg)    Vital signs reviewed  03/11/2020  - Note at rest 02 sats  100% on RA    amb frail bf needs help standing from chair    HEENT : pt wearing mask not removed for exam due to covid -19 concerns.      NECK :  without JVD/Nodes/TM/ nl carotid upstrokes bilaterally     LUNGS: no acc muscle use,  Nl contour chest which is clear to A and P bilaterally without cough on insp or exp maneuvers     CV:  RRR  no s3 or murmur or increase in P2, and no edema    ABD:  soft and nontender with nl inspiratory excursion in the supine position. No bruits or organomegaly appreciated, bowel sounds nl   MS:  Nl gait/ ext warm without deformities, calf tenderness, cyanosis or clubbing No obvious joint  restrictions    SKIN: warm and dry without lesions     NEURO:  alert, approp,  no motor or cerebellar deficits apparent.               ,  Mildly kyphotic chest with distant bs bilaterally     CV:  RRR  no s3  2/6 HSM or increase in P2, and trace bilaterally sym LE pitting edema             Assessment                            Assessment & Plan Note by Nyoka Cowden, MD at 03/11/2020 2:11 PM  Author: Nyoka Cowden, MD Author Type: Physician Filed: 03/11/2020  2:12 PM  Note Status: Written Cosign: Cosign Not Required Encounter Date: 03/11/2020  Problem: Benign essential HTN  Editor: Nyoka Cowden, MD (Physician)               D/C ACEi and K  11/19/2019 > replace with Avapro 150 mg one daily > bp too low even on 75 mg / day so pt d/c'd 12/2019 > improved  03/11/2020   Although even  in retrospect it may not be clear the ACEi contributed to the pt's symptoms,  Pt improved off them and adding them back at this point or in the future would risk confusion in interpretation of non-specific respiratory symptoms to which this patient is prone  ie  Better not to muddy the waters here.    >>> advised re fda warning on generic avapro but daughter reluctant to change rx at present even with reported  risk of carcinogens' = adverse effects and will address further options with pcp        Assessment & Plan Note by Nyoka Cowden, MD at 03/11/2020 2:08 PM  Author: Nyoka Cowden, MD Author Type: Physician Filed: 03/11/2020  2:10 PM  Note Status: Carlisle Cater: Cosign Not Required Encounter Date: 03/11/2020  Problem: Upper airway cough syndrome  Editor: Nyoka Cowden, MD (Physician)      Prior Versions: 1. Nyoka Cowden, MD (Physician) at 03/11/2020  2:09 PM - Written    Onset around 2017 - d/c acei and incruse  11/19/2019 > improved 01/03/2020 but still perceived need for saba  03/11/2020  After extensive coaching inhaler device,  effectiveness =    0%   (consistently sprays in mouth and then exhales   Upper airway cough syndrome (previously labeled PNDS),  is so named because it's frequently impossible to sort out how much is  CR/sinusitis with freq throat clearing (which can be related to primary GERD)   vs  causing  secondary (" extra esophageal")  GERD from wide swings in gastric pressure that occur with throat clearing, often  promoting self use of mint and menthol lozenges that reduce the lower esophageal sphincter tone and exacerbate the problem further in a cyclical fashion.    These are the same pts (now being labeled as having "irritable larynx syndrome" by some cough centers) who not infrequently have a history of having failed to tolerate ace inhibitors,  dry powder inhalers (both may well apply here)  or biphosphonates or report having atypical/extraesophageal reflux symptoms that don't respond to standard doses of PPI  and are easily confused as having aecopd or asthma flares by even experienced allergists/ pulmonologists (myself included).    rec trial of max rx for gerd x one month to see if perceived need for saba resolves and f/u here can be prn as I don't detect a true pulmonary problem           Each maintenance medication was reviewed in detail including emphasizing most importantly the difference between maintenance and prns and under what circumstances the prns are to be triggered using an action plan format where appropriate.   Total time for H and P, chart review, counseling, teaching device and generating customized AVS unique to this office visit / charting = 38 min             Patient Instructions by Nyoka Cowden, MD at 03/11/2020 11:15 AM  Author: Nyoka Cowden, MD Author Type: Physician Filed: 03/11/2020 12:14 PM  Note Status: Addendum Cosign: Cosign Not Required Encounter Date: 03/11/2020  Editor: Nyoka Cowden, MD (Physician)      Prior Versions: 1. Nyoka Cowden, MD (Physician) at 03/11/2020 12:12 PM  - Addendum   2. Nyoka Cowden, MD (Physician) at 03/11/2020 12:10 PM - Addendum   3. Nyoka Cowden, MD (Physician) at 03/11/2020 11:56 AM - Signed    No need for albuterol or incruse > the side effects are likely  to be worse than the benefits.   Pantoprazole (protonix) 40 mg   Take  30-60 min before first meal of the day and Pepcid (famotidine)  20 mg one after supper >>> try   X one month to see if breathing or coughing or congestion improve and if no better after a month ok to stop  - if better ok to let Dr Sudie Bailey refill    GERD (REFLUX)  is an extremely common cause of respiratory symptoms just like yours , many times with no obvious heartburn at all.     It can be treated with medication, but also with lifestyle changes including elevation of the head of your bed (ideally with 6 -8inch blocks under the headboard of your bed),  Smoking cessation, avoidance of late meals, excessive alcohol, and avoid fatty foods, chocolate, peppermint, colas, red wine, and acidic juices such as orange juice.  NO MINT OR MENTHOL PRODUCTS SO NO COUGH DROPS  USE SUGARLESS CANDY INSTEAD (Jolley ranchers or Stover's or Life Savers) or even ice chips will also do - the key is to swallow to prevent all throat clearing. NO OIL BASED VITAMINS - use powdered substitutes.  Avoid fish oil when coughing.     Pulmonary follow up is as needed              Instructions  No need for albuterol or incruse > the side effects are likely to be worse than the benefits.   Pantoprazole (protonix) 40 mg   Take  30-60 min before first meal of the day and Pepcid (famotidine)  20 mg one after supper >>> try   X one month to see if breathing or coughing or congestion improve and if no better after a month ok to stop  - if better ok to let Dr Sudie Bailey refill    GERD (REFLUX)  is an extremely common cause of respiratory symptoms just like yours , many times with no obvious heartburn at all.     It can be treated with  medication, but also with lifestyle changes including elevation of the head of your bed (ideally with 6 -8inch blocks under the headboard of your bed),  Smoking cessation, avoidance of late meals, excessive alcohol, and avoid fatty foods, chocolate, peppermint, colas, red wine, and acidic juices such as orange juice.  NO MINT OR MENTHOL PRODUCTS SO NO COUGH DROPS  USE SUGARLESS CANDY INSTEAD (Jolley ranchers or Stover's or Life Savers) or even ice chips will also do - the key is to swallow to prevent all throat clearing. NO OIL BASED VITAMINS - use powdered substitutes.  Avoid fish oil when coughing.     Pulmonary follow up is as needed

## 2020-05-12 NOTE — Telephone Encounter (Signed)
Called and spoke with pt letting her know the info stated by MW and she verbalized understanding. Verified preferred pharmacy and sent Rx in for pt. nothing further needed.

## 2020-05-12 NOTE — Telephone Encounter (Signed)
Ok to refill x one  Needs ov with all meds in hand before more refills

## 2020-05-21 ENCOUNTER — Other Ambulatory Visit: Payer: Self-pay | Admitting: Internal Medicine

## 2020-05-21 DIAGNOSIS — R058 Other specified cough: Secondary | ICD-10-CM

## 2020-06-02 ENCOUNTER — Other Ambulatory Visit (HOSPITAL_COMMUNITY): Payer: Self-pay | Admitting: Family Medicine

## 2020-06-02 DIAGNOSIS — R634 Abnormal weight loss: Secondary | ICD-10-CM

## 2020-09-26 ENCOUNTER — Other Ambulatory Visit (HOSPITAL_COMMUNITY): Payer: Self-pay | Admitting: Nurse Practitioner

## 2020-09-26 ENCOUNTER — Other Ambulatory Visit: Payer: Self-pay | Admitting: Cardiology

## 2020-09-26 ENCOUNTER — Other Ambulatory Visit: Payer: Self-pay | Admitting: Internal Medicine

## 2020-09-26 NOTE — Telephone Encounter (Signed)
Pt last saw Dr Diona Browner 04/14/20, last labs 02/19/20 Creat 1.20, age 87, weight 46.7kg, based on specified criteria pt is on appropriate dosage of Eliquis 2.5mg  BID.  Will refill rx.

## 2020-10-20 NOTE — Progress Notes (Deleted)
Cardiology Office Note  Date: 10/20/2020   ID: [name], DOB May 26, 1926, MRN [id number]  PCP:  Charlene Morgan, MD  Cardiologist:  Charlene Dell, MD Electrophysiologist:  None   No chief complaint on file.   History of Present Illness: [name] is a 87 y.o. female last seen in November 2021.  Past Medical History:  Diagnosis Date  . Anxiety   . Atrial fibrillation (HCC)   . COPD (chronic obstructive pulmonary disease) (HCC)   . Depression   . Essential hypertension   . Hyperlipidemia   . Hypothyroidism   . Mitral regurgitation   . Palpitations    PAT/EAT/NSVT, normal LVEF  . Type 2 diabetes mellitus (HCC)     Past Surgical History:  Procedure Laterality Date  . 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 hiatal hernia. Antral erosions status post gastric biopsy.  . ESOPHAGOGASTRODUODENOSCOPY N/A 05/12/2016   Dr. Jena Gauss: moderate Schatzki ring s/p dilation. moderate sized hiatal hernia  . MALONEY DILATION N/A 04/17/2014   Procedure: [name] DILATION;  Surgeon: Charlene Ade, MD;  [facility];  Service: Endoscopy;  Laterality: N/A;  [name] DILATION N/A 05/12/2016   Procedure: [name] DILATION;  Surgeon: Charlene Ade, MD;  Location: [facility];  Service: Endoscopy;  Laterality: N/A;  . none      Current Outpatient Medications  Medication Sig Dispense Refill  . ACCU-CHEK AVIVA PLUS test strip 1 each daily.    Marland Kitchen acetaminophen (TYLENOL) 650 MG CR tablet Take 650 mg by mouth as needed for pain.    Marland Kitchen albuterol (VENTOLIN HFA) 108 (90 Base) MCG/ACT inhaler Inhale 1-2 puffs into the lungs every 4 (four) hours as needed for wheezing or shortness of breath. 8 g 0  . ALPRAZolam (XANAX) 0.25 MG tablet Take 0.25 mg by mouth at bedtime as needed for anxiety.    Marland Kitchen amiodarone (PACERONE) 200 MG tablet Take 0.5 tablets (100 mg total) by mouth  daily. 90 tablet 2  . atorvastatin (LIPITOR) 40 MG tablet Take 40 mg by mouth daily at 6 PM.    . bisacodyl (DULCOLAX) 5 MG EC tablet Take 5 mg by mouth daily as needed for moderate constipation.    . cetirizine (ZYRTEC) 10 MG tablet Take 5-10 mg by mouth daily as needed for allergies or rhinitis.     . cloNIDine (CATAPRES) 0.1 MG tablet TAKE 1 TABLET BY MOUTH UP TO THREE TIMES DAILY IF BLOOD PRESSURE IS GREATER THAN 140/90    . COD LIVER OIL PO Take 15 mLs by mouth every other day.     Marland Kitchen dextromethorphan 15 MG/5ML syrup Take 10 mLs by mouth 4 (four) times daily as needed for cough.     Marland Kitchen EASY COMFORT LANCETS MISC TO test blood glucose ONCE daily    . ELIQUIS 2.5 MG TABS tablet TAKE (1) TABLET TWICE DAILY. 60 tablet 5  . famotidine (PEPCID) 20 MG tablet One after supper 30 tablet 11  . furosemide (LASIX) 20 MG tablet Take 20 mg by mouth.    . irbesartan (AVAPRO) 150 MG tablet Take 1 tablet (150 mg total) by mouth daily. 30 tablet 11  . Lancet Devices (ADJUSTABLE LANCING DEVICE) MISC USE TO check blood glucose    . levothyroxine (SYNTHROID, LEVOTHROID) 25 MCG tablet Take 25 mcg by mouth daily before breakfast.    . metoprolol tartrate (LOPRESSOR) 50 MG tablet TAKE 1 &  1/2 TABLETS TWICE DAILY. 84 tablet 3  . pantoprazole (PROTONIX) 40 MG tablet TAKE 1 TABLET BY MOUTH EVERY DAY 30-60 MINUTES BEFORE FIRST MEAL OF THE DAY 30 tablet 0  . spironolactone (ALDACTONE) 25 MG tablet Take 25 mg by mouth daily.     No current facility-administered medications for this visit.   Allergies:  Chocolate   Social History: The patient  reports that she has quit smoking. She has never used smokeless tobacco. She reports that she does not drink alcohol and does not use drugs.   Family History: The patient's family history includes Arthritis in an other family member; Cancer in her brother; Colon cancer in an other family member; Heart failure in her mother; Tuberculosis in her father.   ROS:  Please see the  history of present illness. Otherwise, complete review of systems is positive for {NONE DEFAULTED:18576::"none"}.  All other systems are reviewed and negative.   Physical Exam: VS:  There were no vitals taken for this visit., BMI There is no height or weight on file to calculate BMI.  Wt Readings from Last 3 Encounters:  04/14/20 103 lb (46.7 kg)  03/18/20 105 lb (47.6 kg)  03/11/20 103 lb (46.7 kg)    General: Patient appears comfortable at rest. HEENT: Conjunctiva and lids normal, oropharynx clear with moist mucosa. Neck: Supple, no elevated JVP or carotid bruits, no thyromegaly. Lungs: Clear to auscultation, nonlabored breathing at rest. Cardiac: Regular rate and rhythm, no S3 or significant systolic murmur, no pericardial rub. Abdomen: Soft, nontender, no hepatomegaly, bowel sounds present, no guarding or rebound. Extremities: No pitting edema, distal pulses 2+. Skin: Warm and dry. Musculoskeletal: No kyphosis. Neuropsychiatric: Alert and oriented x3, affect grossly appropriate.  ECG:  An ECG dated 04/14/2020 was personally reviewed today and demonstrated:  Atypical atrial flutter versus atrial tachycardia with 2:1 block, IVCD.  Recent Labwork: 11/19/2019: B Natriuretic Peptide 233.0; Hemoglobin 10.5; Platelets 223 02/19/2020: ALT 22; AST 28; BUN 24; Creatinine, Ser 1.20; Potassium 5.2; Sodium 136; TSH 3.822   Other Studies Reviewed Today:  Echocardiogram 09/27/2019: 1. Left ventricular ejection fraction, by estimation, is 50 to 55%. The  left ventricle has low normal function. The left ventricle has no regional  wall motion abnormalities. Left ventricular diastolic parameters are  consistent with Grade II diastolic  dysfunction (pseudonormalization). Elevated left atrial pressure.  2. Right ventricular systolic function is low normal. The right  ventricular size is mildly enlarged. There is severely elevated pulmonary  artery systolic pressure.  3. Left atrial size was  severely dilated.  4. Right atrial size was mildly dilated.  5. The mitral valve is degenerative. Severe mitral valve regurgitation.  6. The tricuspid valve is degenerative. Tricuspid valve regurgitation is  moderate to severe.  7. The aortic valve is tricuspid. Aortic valve regurgitation is mild.  Mild aortic valve sclerosis is present, with no evidence of aortic valve  stenosis.  8. The inferior vena cava is dilated in size with >50% respiratory  variability, suggesting right atrial pressure of 8 mmHg.   Assessment and Plan:    Medication Adjustments/Labs and Tests Ordered: Current medicines are reviewed at length with the patient today.  Concerns regarding medicines are outlined above.   Tests Ordered: No orders of the defined types were placed in this encounter.   Medication Changes: No orders of the defined types were placed in this encounter.   Disposition:  Follow up {follow up:15908}  Signed, Jonelle Sidle, MD, [facility] 10/20/2020 3:00 PM  [facility] [place], [place], [place] [address] Phone: [phone]; Fax: [phone]

## 2020-10-21 ENCOUNTER — Ambulatory Visit: Payer: Medicare Other | Admitting: Cardiology

## 2020-10-21 DIAGNOSIS — I484 Atypical atrial flutter: Secondary | ICD-10-CM

## 2020-10-22 ENCOUNTER — Other Ambulatory Visit: Payer: Self-pay | Admitting: Internal Medicine

## 2021-01-13 ENCOUNTER — Other Ambulatory Visit: Payer: Self-pay | Admitting: Internal Medicine

## 2021-01-13 ENCOUNTER — Other Ambulatory Visit (HOSPITAL_COMMUNITY): Payer: Self-pay | Admitting: Nurse Practitioner

## 2021-01-13 DIAGNOSIS — R058 Other specified cough: Secondary | ICD-10-CM

## 2021-02-02 NOTE — Progress Notes (Deleted)
Cardiology Office Note  Date: 02/02/2021   ID: [name], DOB 01-Aug-1926, MRN [id number]  PCP:  Gareth Morgan, MD  Cardiologist:  Nona Dell, MD Electrophysiologist:  None   Chief Complaint: 74-month follow-up  History of Present Illness: [name] is a 87 y.o. female with a history of atrial flutter/atrial fibrillation, COPD, HTN, HLD, hypothyroidism, palpitations, DM2, mitral regurgitation.  She was last seen by Dr. Diona Browner on 04/14/2020.  She had been doing relatively well without palpitations or progressive shortness of breath with ADLs.  TSH and LFTs were normal in September.  EKG showed atypical atrial flutter versus atrial tachycardia with 2-1 block, IVCD.  He was currently doing well in terms of symptom control for atrial fibrillation.  CHA2DS2-VASc score was 6.  She is continuing on Eliquis 5 stroke prophylaxis without bleeding.  She was continuing low-dose amiodarone and Lopressor with good rate control.  There were no changes made to medications.  Continuing conservative management of severe MR and moderate to severe tricuspid regurgitation.  No evidence of fluid overload and weight was stable.  She was continuing clonidine, Avapro, and Aldactone for hypertension.  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 hiatal hernia. Antral erosions status post gastric biopsy.   ESOPHAGOGASTRODUODENOSCOPY N/A 05/12/2016   Dr. Jena Gauss: moderate Schatzki ring s/p dilation. moderate sized hiatal  hernia   MALONEY DILATION N/A 04/17/2014   Procedure: [name] DILATION;  Surgeon: Corbin Ade, MD;  [facility];  Service: Endoscopy;  Laterality: N/A;   MALONEY DILATION N/A 05/12/2016   Procedure: [name] DILATION;  Surgeon: Corbin Ade, MD;  [facility];  Service: Endoscopy;  Laterality: N/A;   none      Current Outpatient Medications  Medication Sig Dispense Refill   ACCU-CHEK AVIVA PLUS test strip 1 each daily.     acetaminophen (TYLENOL) 650 MG CR tablet Take 650 mg by mouth as needed for pain.     albuterol (VENTOLIN HFA) 108 (90 Base) MCG/ACT inhaler Inhale 1-2 puffs into the lungs every 4 (four) hours as needed for wheezing or shortness of breath. 8 g 0   ALPRAZolam (XANAX) 0.25 MG tablet Take 0.25 mg by mouth at bedtime as needed for anxiety.     amiodarone (PACERONE) 200 MG tablet Take 0.5 tablets (100 mg total) by mouth daily. 90 tablet 2   atorvastatin (LIPITOR) 40 MG tablet Take 40 mg by mouth daily at 6 PM.     bisacodyl (DULCOLAX) 5 MG EC tablet Take 5 mg by mouth daily as needed for moderate constipation.     cetirizine (ZYRTEC) 10 MG tablet Take 5-10 mg by mouth daily as needed for allergies or rhinitis.      cloNIDine (CATAPRES) 0.1 MG tablet TAKE 1 TABLET BY MOUTH UP TO THREE TIMES DAILY IF BLOOD PRESSURE IS GREATER THAN 140/90     COD LIVER OIL PO Take 15 mLs by mouth every other day.  dextromethorphan 15 MG/5ML syrup Take 10 mLs by mouth 4 (four) times daily as needed for cough.      EASY COMFORT LANCETS MISC TO test blood glucose ONCE daily     ELIQUIS 2.5 MG TABS tablet TAKE (1) TABLET TWICE DAILY. 60 tablet 5   famotidine (PEPCID) 20 MG tablet One after supper 30 tablet 11   furosemide (LASIX) 20 MG tablet Take 20 mg by mouth.     irbesartan (AVAPRO) 150 MG tablet TAKE 1 TABLET ONCE DAILY. 30 tablet 0   Lancet Devices (ADJUSTABLE LANCING DEVICE) MISC USE TO check blood glucose     levothyroxine (SYNTHROID, LEVOTHROID) 25 MCG tablet  Take 25 mcg by mouth daily before breakfast.     metoprolol tartrate (LOPRESSOR) 50 MG tablet TAKE 1 & 1/2 TABLETS TWICE DAILY. 45 tablet 0   pantoprazole (PROTONIX) 40 MG tablet TAKE 1 TABLET BY MOUTH EVERY DAY 30-60 MINUTES BEFORE FIRST MEAL OF THE DAY 30 tablet 0   spironolactone (ALDACTONE) 25 MG tablet Take 25 mg by mouth daily.     No current facility-administered medications for this visit.   Allergies:  Chocolate   Social History: The patient  reports that she has quit smoking. She has never used smokeless tobacco. She reports that she does not drink alcohol and does not use drugs.   Family History: The patient's family history includes Arthritis in an other family member; Cancer in her brother; Colon cancer in an other family member; Heart failure in her mother; Tuberculosis in her father.   ROS:  Please see the history of present illness. Otherwise, complete review of systems is positive for {NONE DEFAULTED:18576}.  All other systems are reviewed and negative.   Physical Exam: VS:  There were no vitals taken for this visit., BMI There is no height or weight on file to calculate BMI.  Wt Readings from Last 3 Encounters:  04/14/20 103 lb (46.7 kg)  03/18/20 105 lb (47.6 kg)  03/11/20 103 lb (46.7 kg)    General: Patient appears comfortable at rest. HEENT: Conjunctiva and lids normal, oropharynx clear with moist mucosa. Neck: Supple, no elevated JVP or carotid bruits, no thyromegaly. Lungs: Clear to auscultation, nonlabored breathing at rest. Cardiac: Regular rate and rhythm, no S3 or significant systolic murmur, no pericardial rub. Abdomen: Soft, nontender, no hepatomegaly, bowel sounds present, no guarding or rebound. Extremities: No pitting edema, distal pulses 2+. Skin: Warm and dry. Musculoskeletal: No kyphosis. Neuropsychiatric: Alert and oriented x3, affect grossly appropriate.  ECG:  {EKG/Telemetry Strips Reviewed:[id number]}  Recent Labwork: 02/19/2020: ALT 22; AST  28; BUN 24; Creatinine, Ser 1.20; Potassium 5.2; Sodium 136; TSH 3.822     Component Value Date/Time   CHOL 142 07/14/2014 0456   TRIG 32 07/14/2014 0456   HDL 58 07/14/2014 0456   CHOLHDL 2.4 07/14/2014 0456   VLDL 6 07/14/2014 0456   LDLCALC 78 07/14/2014 0456    Other Studies Reviewed Today:  Echocardiogram 09/27/2019:  1. Left ventricular ejection fraction, by estimation, is 50 to 55%. The  left ventricle has low normal function. The left ventricle has no regional  wall motion abnormalities. Left ventricular diastolic parameters are  consistent with Grade II diastolic  dysfunction (pseudonormalization). Elevated left atrial pressure.   2. Right ventricular systolic function is low normal. The right  ventricular size is mildly enlarged. There is severely elevated pulmonary  artery systolic pressure.   3. Left atrial size was severely dilated.   4. Right atrial size was mildly  dilated.   5. The mitral valve is degenerative. Severe mitral valve regurgitation.   6. The tricuspid valve is degenerative. Tricuspid valve regurgitation is  moderate to severe.   7. The aortic valve is tricuspid. Aortic valve regurgitation is mild.  Mild aortic valve sclerosis is present, with no evidence of aortic valve  stenosis.   8. The inferior vena cava is dilated in size with >50% respiratory  variability, suggesting right atrial pressure of 8 mmHg.    Assessment and Plan:  1. Paroxysmal atrial fibrillation (HCC)   2. Valvular heart disease   3. Essential hypertension    1. Paroxysmal atrial fibrillation (HCC) ***  2. Valvular heart disease ***  3. Essential hypertension ***   Medication Adjustments/Labs and Tests Ordered: Current medicines are reviewed at length with the patient today.  Concerns regarding medicines are outlined above.   Disposition: Follow-up with ***  Signed, Rennis Harding, NP 02/02/2021 1:24 PM    [facility] [address]  [place], [place], [place] [address] Phone: [phone]; Fax: [phone]

## 2021-02-03 ENCOUNTER — Ambulatory Visit: Payer: Medicare Other | Admitting: Family Medicine

## 2021-02-03 DIAGNOSIS — I10 Essential (primary) hypertension: Secondary | ICD-10-CM

## 2021-02-03 DIAGNOSIS — I38 Endocarditis, valve unspecified: Secondary | ICD-10-CM

## 2021-02-03 DIAGNOSIS — I480 Paroxysmal atrial fibrillation: Secondary | ICD-10-CM

## 2021-02-10 ENCOUNTER — Other Ambulatory Visit: Payer: Self-pay | Admitting: Internal Medicine

## 2021-02-10 ENCOUNTER — Other Ambulatory Visit (HOSPITAL_COMMUNITY): Payer: Self-pay | Admitting: Nurse Practitioner

## 2021-02-10 DIAGNOSIS — R058 Other specified cough: Secondary | ICD-10-CM

## 2021-03-11 ENCOUNTER — Other Ambulatory Visit: Payer: Self-pay | Admitting: Internal Medicine

## 2021-03-11 ENCOUNTER — Telehealth: Payer: Self-pay | Admitting: Cardiology

## 2021-03-11 DIAGNOSIS — R058 Other specified cough: Secondary | ICD-10-CM

## 2021-03-11 NOTE — Telephone Encounter (Signed)
Prescription refill request for Eliquis received. Indication: PAF Last office visit: 04/14/20  Ival Bible MD Scr: 1.20 on 01/30/20 Age: 87 Weight: 46.7kg  Based on above findings Eliquis 2.5mg  twice daily is the appropriate dose.  Patient is past due for appt with Dr Diona Browner and lab work (CBC/BMP).  Message sent to [facility] scheduler to make appt.  Refill approved x 2

## 2021-04-07 ENCOUNTER — Other Ambulatory Visit: Payer: Self-pay | Admitting: Cardiology

## 2021-04-07 ENCOUNTER — Other Ambulatory Visit: Payer: Self-pay | Admitting: Internal Medicine

## 2021-04-07 DIAGNOSIS — R058 Other specified cough: Secondary | ICD-10-CM

## 2021-04-08 NOTE — Telephone Encounter (Signed)
ATC patient to make an appt. No VM available Refill request for pepcid but has not been seen since 02/2020

## 2021-04-09 ENCOUNTER — Other Ambulatory Visit: Payer: Self-pay | Admitting: Internal Medicine

## 2021-04-09 MED ORDER — AMIODARONE HCL 200 MG PO TABS
100.0000 mg | ORAL_TABLET | Freq: Every day | ORAL | 6 refills | Status: DC
Start: 2021-04-09 — End: 2021-07-09

## 2021-04-09 NOTE — Telephone Encounter (Signed)
*  STAT* If patient is at the pharmacy, call can be transferred to refill team.   1. Which medications need to be refilled? (please list name of each medication and dose if known) Amiodarone  2. Which pharmacy/location (including street and city if local pharmacy) is medication to be sent to? [facility],[place]  3. Do they need a 30 day or 90 day supply? Enough until her appointment on 05-14-21

## 2021-04-09 NOTE — Telephone Encounter (Signed)
Done

## 2021-05-07 ENCOUNTER — Other Ambulatory Visit: Payer: Self-pay | Admitting: Cardiology

## 2021-05-07 ENCOUNTER — Other Ambulatory Visit: Payer: Self-pay | Admitting: Internal Medicine

## 2021-05-13 NOTE — Progress Notes (Signed)
Cardiology Office Note  Date: 05/14/2021   ID: [name], DOB December 15, 1926, MRN [id number]  PCP:  Gareth Morgan, MD  Cardiologist:  Nona Dell, MD Electrophysiologist:  None   Chief Complaint  Patient presents with   Cardiac follow-up    History of Present Illness: [name] is a 87 y.o. female last seen in November 2021.  She is here today with her daughter for a follow-up visit.  Recently having trouble with decreased hearing, had her ears cleaned out by PCP and is feeling somewhat better.  She stays in her home most of the time, still functional with basic ADLs including cooking.  He does not report any palpitations.  I personally reviewed her ECG today which shows sinus rhythm with IVCD of left bundle branch block type.  We went over her medications.  I did discuss getting follow-up lab work for surveillance.  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 hiatal hernia. Antral erosions status post gastric biopsy.   ESOPHAGOGASTRODUODENOSCOPY N/A 05/12/2016   Dr. Jena Gauss: moderate Schatzki ring s/p dilation. moderate sized hiatal hernia   MALONEY DILATION N/A 04/17/2014   Procedure: [name] DILATION;  Surgeon: Corbin Ade, MD;  [facility];  Service: Endoscopy;  Laterality: N/A;   MALONEY DILATION N/A 05/12/2016   Procedure: [name] DILATION;  Surgeon: Corbin Ade, MD;  [facility];  Service: Endoscopy;  Laterality: N/A;   none      Current Outpatient  Medications  Medication Sig Dispense Refill   ACCU-CHEK AVIVA PLUS test strip 1 each daily.     acetaminophen (TYLENOL) 650 MG CR tablet Take 650 mg by mouth as needed for pain.     albuterol (VENTOLIN HFA) 108 (90 Base) MCG/ACT inhaler Inhale 1-2 puffs into the lungs every 4 (four) hours as needed for wheezing or shortness of breath. 8 g 0   ALPRAZolam (XANAX) 0.25 MG tablet Take 0.25 mg by mouth at bedtime as needed for anxiety.     amiodarone (PACERONE) 200 MG tablet Take 0.5 tablets (100 mg total) by mouth daily. 15 tablet 6   apixaban (ELIQUIS) 2.5 MG TABS tablet TAKE (1) TABLET TWICE DAILY. 60 tablet 1   atorvastatin (LIPITOR) 40 MG tablet Take 40 mg by mouth daily at 6 PM.     bisacodyl (DULCOLAX) 5 MG EC tablet Take 5 mg by mouth daily as needed for moderate constipation.     cetirizine (ZYRTEC) 10 MG tablet Take 5-10 mg by mouth at bedtime.     cloNIDine (CATAPRES) 0.1 MG tablet TAKE 1 TABLET BY MOUTH UP TO THREE TIMES DAILY IF BLOOD PRESSURE IS GREATER THAN 140/90     COD LIVER OIL PO Take 15 mLs by mouth every other day.      dextromethorphan 15 MG/5ML syrup Take 10 mLs by mouth 4 (four) times daily as needed for cough.      EASY COMFORT LANCETS MISC TO test blood glucose ONCE daily  famotidine (PEPCID) 20 MG tablet TAKE 1 TABLET BY MOUTH EVERY DAY AFTER SUPPER. 30 tablet 3   furosemide (LASIX) 20 MG tablet Take 20 mg by mouth daily as needed.     Lancet Devices (ADJUSTABLE LANCING DEVICE) MISC USE TO check blood glucose     levothyroxine (SYNTHROID, LEVOTHROID) 25 MCG tablet Take 25 mcg by mouth daily before breakfast.     metoprolol tartrate (LOPRESSOR) 50 MG tablet Take 1.5 tablets (75 mg total) by mouth 2 (two) times daily. 90 tablet 6   pantoprazole (PROTONIX) 40 MG tablet TAKE 1 TABLET BY MOUTH EVERY DAY 30-60 MINUTES BEFORE FIRST MEAL OF THE DAY 30 tablet 0   No current facility-administered medications for this visit.   Allergies:  Chocolate   ROS: Hearing loss.  No  reported falls or syncope.  Physical Exam: VS:  BP (!) 170/80    Pulse (!) 54    Ht 5\' 4"  (1.626 m)    Wt 100 lb 6.4 oz (45.5 kg)    SpO2 99%    BMI 17.23 kg/m , BMI Body mass index is 17.23 kg/m.  Wt Readings from Last 3 Encounters:  05/14/21 100 lb 6.4 oz (45.5 kg)  04/14/20 103 lb (46.7 kg)  03/18/20 105 lb (47.6 kg)    General: Patient appears comfortable at rest. HEENT: Conjunctiva and lids normal, wearing a mask. Neck: Supple, no elevated JVP or carotid bruits, no thyromegaly. Lungs: Clear to auscultation, nonlabored breathing at rest. Cardiac: Regular rate and rhythm, no S3, 2/6 systolic murmur, no pericardial rub. Extremities: No pitting edema.  ECG:  An ECG dated 04/14/2020 was personally reviewed today and demonstrated:  Atypical atrial flutter versus atrial tachycardia with 2:1 block, IVCD.  Recent Labwork:  02/19/2020: ALT 22; AST 28; BUN 24; Creatinine, Ser 1.20; Potassium 5.2; Sodium 136; TSH 3.822   Other Studies Reviewed Today:  Echocardiogram 09/27/2019:  1. Left ventricular ejection fraction, by estimation, is 50 to 55%. The  left ventricle has low normal function. The left ventricle has no regional  wall motion abnormalities. Left ventricular diastolic parameters are  consistent with Grade II diastolic  dysfunction (pseudonormalization). Elevated left atrial pressure.   2. Right ventricular systolic function is low normal. The right  ventricular size is mildly enlarged. There is severely elevated pulmonary  artery systolic pressure.   3. Left atrial size was severely dilated.   4. Right atrial size was mildly dilated.   5. The mitral valve is degenerative. Severe mitral valve regurgitation.   6. The tricuspid valve is degenerative. Tricuspid valve regurgitation is  moderate to severe.   7. The aortic valve is tricuspid. Aortic valve regurgitation is mild.  Mild aortic valve sclerosis is present, with no evidence of aortic valve  stenosis.   8. The inferior  vena cava is dilated in size with >50% respiratory  variability, suggesting right atrial pressure of 8 mmHg.   Assessment and Plan:  1.  Paroxysmal to persistent atrial fibrillation/flutter with CHA2DS2-VASc score of at least 6.  She is in sinus rhythm today by ECG and reports no palpitations.  Continues on low-dose Eliquis as well as amiodarone and Lopressor.  Check CBC, CMET, and TSH.  2.  Valvular heart disease including severe mitral regurgitation and moderate to severe tricuspid regurgitation, both being followed conservatively at this point.  3.  Essential hypertension with fluctuating blood pressure.  No changes made to present regimen.  Medication Adjustments/Labs and Tests Ordered: Current medicines are reviewed at length with the  patient today.  Concerns regarding medicines are outlined above.   Tests Ordered: Orders Placed This Encounter  Procedures   CBC   TSH   Comprehensive metabolic panel   EKG 12-Lead    Medication Changes: No orders of the defined types were placed in this encounter.   Disposition:  Follow up  6 months.  Signed, Jonelle Sidle, MD, [facility] 05/14/2021 9:52 AM    [facility] at [facility] [address] [place], [place], [place] [address] Phone: [phone]; Fax: [phone]

## 2021-05-14 ENCOUNTER — Encounter: Payer: Self-pay | Admitting: Cardiology

## 2021-05-14 ENCOUNTER — Ambulatory Visit: Payer: Medicare Other | Admitting: Cardiology

## 2021-05-14 VITALS — BP 170/80 | HR 54 | Ht 64.0 in | Wt 100.4 lb

## 2021-05-14 DIAGNOSIS — Z79899 Other long term (current) drug therapy: Secondary | ICD-10-CM | POA: Diagnosis not present

## 2021-05-14 DIAGNOSIS — I4892 Unspecified atrial flutter: Secondary | ICD-10-CM

## 2021-05-14 DIAGNOSIS — I10 Essential (primary) hypertension: Secondary | ICD-10-CM

## 2021-05-14 DIAGNOSIS — I4891 Unspecified atrial fibrillation: Secondary | ICD-10-CM | POA: Diagnosis not present

## 2021-05-14 NOTE — Patient Instructions (Addendum)
Medication Instructions:  Your physician recommends that you continue on your current medications as directed. Please refer to the Current Medication list given to you today.  Labwork: CMET, TSH & CBC today at [facility]  Testing/Procedures: none  Follow-Up: Your physician recommends that you schedule a follow-up appointment in: 6 months  Any Other Special Instructions Will Be Listed Below (If Applicable).  If you need a refill on your cardiac medications before your next appointment, please call your pharmacy.

## 2021-06-03 ENCOUNTER — Other Ambulatory Visit: Payer: Self-pay | Admitting: Internal Medicine

## 2021-06-03 DIAGNOSIS — R058 Other specified cough: Secondary | ICD-10-CM

## 2021-06-30 ENCOUNTER — Other Ambulatory Visit: Payer: Self-pay | Admitting: Cardiology

## 2021-06-30 NOTE — Telephone Encounter (Signed)
Prescription refill request for Eliquis received. Indication: Atrial fib/flutter Last office visit: 05/14/21  Ival Bible MD Scr: 1.20 on 02/19/20 Age: 88 Weight:45.5kg  Based on above findings Eliquis 2.5mg  twice daily is the appropriate dose.  Pt is PAST DUE for lab work.  Message sent to nurse to schedule CBC/BMP.  Refill approved x 1 only.

## 2021-07-01 NOTE — Telephone Encounter (Signed)
Patient's daughter [name] informed that lab work is needed and verbalized understanding of plan.

## 2021-07-08 ENCOUNTER — Telehealth: Payer: Self-pay | Admitting: Cardiology

## 2021-07-08 ENCOUNTER — Encounter: Payer: Self-pay | Admitting: *Deleted

## 2021-07-08 NOTE — Telephone Encounter (Signed)
Noted  

## 2021-07-08 NOTE — Telephone Encounter (Signed)
Patient's daughter is calling to let us know she got her blood work done this morning.

## 2021-07-09 ENCOUNTER — Telehealth: Payer: Self-pay | Admitting: *Deleted

## 2021-07-09 DIAGNOSIS — Z79899 Other long term (current) drug therapy: Secondary | ICD-10-CM

## 2021-07-09 DIAGNOSIS — E039 Hypothyroidism, unspecified: Secondary | ICD-10-CM

## 2021-07-09 NOTE — Telephone Encounter (Signed)
Patient informed and verbalized understanding of plan. Copy sent to PCP. Lab order faxed to [facility]. 

## 2021-07-09 NOTE — Telephone Encounter (Signed)
-----   Message from Jonelle Sidle, MD sent at 07/08/2021  8:31 PM EST ----- Results reviewed.  Renal function is stable with creatinine 1.04, LFTs are also normal.  She is mildly anemic with hemoglobin 10.9.  Importantly, her TSH is significantly elevated at 62 consistent with hypothyroidism.  This could be due to her amiodarone.  It is already at the lowest dose so I would suggest stopping it completely.  Please review this information with patient's PCP as she may need to be started on Synthroid at least in the short-term.  Would plan to recheck TSH, free T3 and T4 in 6 weeks.

## 2021-07-14 NOTE — Telephone Encounter (Signed)
[name] CMA @PCP , informed and verbalized understanding

## 2021-07-28 ENCOUNTER — Other Ambulatory Visit: Payer: Self-pay | Admitting: Cardiology

## 2021-07-28 NOTE — Telephone Encounter (Signed)
Prescription refill request for Eliquis received. ?Indication: Atrial fib ?Last office visit: 05/14/21  Myles Gip MD ?Scr: 1.04 on 07/08/21 ?Age: 88 ?Weight: 45.5kg ? ?Based on above findings Eliquis 2.5mg  twice daily is the appropriate dose.  Refill approved. ? ?

## 2021-07-30 ENCOUNTER — Other Ambulatory Visit: Payer: Self-pay | Admitting: Internal Medicine

## 2021-07-30 DIAGNOSIS — R058 Other specified cough: Secondary | ICD-10-CM

## 2021-08-25 ENCOUNTER — Other Ambulatory Visit: Payer: Self-pay | Admitting: Internal Medicine

## 2021-08-25 DIAGNOSIS — R058 Other specified cough: Secondary | ICD-10-CM

## 2021-09-22 ENCOUNTER — Other Ambulatory Visit: Payer: Self-pay | Admitting: Cardiology

## 2021-10-21 ENCOUNTER — Encounter: Payer: Self-pay | Admitting: *Deleted

## 2021-10-22 ENCOUNTER — Other Ambulatory Visit: Payer: Self-pay | Admitting: Internal Medicine

## 2021-10-22 ENCOUNTER — Other Ambulatory Visit: Payer: Self-pay | Admitting: Cardiology

## 2021-10-22 DIAGNOSIS — R058 Other specified cough: Secondary | ICD-10-CM

## 2021-11-05 ENCOUNTER — Telehealth: Payer: Self-pay | Admitting: Cardiology

## 2021-11-05 NOTE — Telephone Encounter (Signed)
Addressed on lab result note See result note for details

## 2021-11-05 NOTE — Telephone Encounter (Signed)
patient's daughter was requesting to speak with Dr. Diona Browner or nurse in regards to some results from May that was never received

## 2021-11-13 ENCOUNTER — Other Ambulatory Visit: Payer: Self-pay | Admitting: Internal Medicine

## 2021-11-13 DIAGNOSIS — R058 Other specified cough: Secondary | ICD-10-CM

## 2021-11-17 ENCOUNTER — Other Ambulatory Visit: Payer: Self-pay | Admitting: Cardiology

## 2021-11-19 ENCOUNTER — Ambulatory Visit: Payer: Medicare Other | Admitting: Cardiology

## 2021-11-19 ENCOUNTER — Encounter: Payer: Self-pay | Admitting: Cardiology

## 2021-11-19 VITALS — BP 140/78 | HR 63 | Wt 99.8 lb

## 2021-11-19 DIAGNOSIS — I38 Endocarditis, valve unspecified: Secondary | ICD-10-CM

## 2021-11-19 DIAGNOSIS — I10 Essential (primary) hypertension: Secondary | ICD-10-CM

## 2021-11-19 DIAGNOSIS — R058 Other specified cough: Secondary | ICD-10-CM

## 2021-11-19 DIAGNOSIS — I4819 Other persistent atrial fibrillation: Secondary | ICD-10-CM

## 2021-11-19 MED ORDER — PANTOPRAZOLE SODIUM 40 MG PO TBEC: 40.0000 mg | DELAYED_RELEASE_TABLET | Freq: Every day | ORAL | 4 refills | Status: AC

## 2021-11-19 NOTE — Progress Notes (Signed)
Cardiology Office Note  Date: 11/19/2021   ID: [name], DOB 1926/08/18, MRN [id number]  PCP:  John Giovanni, MD  Cardiologist:  Nona Dell, MD Electrophysiologist:  None   Chief Complaint  Patient presents with   Cardiac follow-up    History of Present Illness: [name] is a 88 y.o. female seen in December 2022.  She is here today with her daughter for a follow-up visit.  Overall no major change from a cardiac perspective.  She does not describe any chest pain or palpitations.  Does not get out of the house much and is fairly sedentary at baseline.  Reports good appetite however.  Amiodarone was discontinued completely earlier this year due to TSH 62 consistent with hypothyroidism.  Subsequent TSH had improved significantly down to 7.1 as of May.  We discussed this today.  I reviewed the remainder of her cardiac regimen which includes Eliquis, Lipitor, Lasix, and Lopressor.  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 hiatal hernia. Antral erosions status post gastric biopsy.   ESOPHAGOGASTRODUODENOSCOPY N/A 05/12/2016   Dr. Jena Gauss: moderate Schatzki ring s/p dilation. moderate sized hiatal hernia   MALONEY DILATION N/A 04/17/2014   Procedure: [name] DILATION;  Surgeon: Corbin Ade, MD;  [facility]: [facility];  Service: Endoscopy;  Laterality: N/A;   MALONEY DILATION N/A 05/12/2016   Procedure: [name] DILATION;  Surgeon: Corbin Ade, MD;  Location: [facility];  Service:  Endoscopy;  Laterality: N/A;   none      Current Outpatient Medications  Medication Sig Dispense Refill   ACCU-CHEK AVIVA PLUS test strip 1 each daily.     acetaminophen (TYLENOL) 650 MG CR tablet Take 650 mg by mouth as needed for pain.     albuterol (VENTOLIN HFA) 108 (90 Base) MCG/ACT inhaler Inhale 1-2 puffs into the lungs every 4 (four) hours as needed for wheezing or shortness of breath. 8 g 0   ALPRAZolam (XANAX) 0.25 MG tablet Take 0.25 mg by mouth at bedtime as needed for anxiety.     apixaban (ELIQUIS) 2.5 MG TABS tablet TAKE (1) TABLET TWICE DAILY. 60 tablet 5   atorvastatin (LIPITOR) 40 MG tablet Take 40 mg by mouth daily at 6 PM.     bisacodyl (DULCOLAX) 5 MG EC tablet Take 5 mg by mouth daily as needed for moderate constipation.     cetirizine (ZYRTEC) 10 MG tablet Take 5-10 mg by mouth at bedtime.     cloNIDine (CATAPRES) 0.1 MG tablet TAKE 1 TABLET BY MOUTH UP TO THREE TIMES DAILY IF BLOOD PRESSURE IS GREATER THAN 140/90     COD LIVER OIL PO Take 15 mLs by mouth every other day.      dextromethorphan 15 MG/5ML syrup Take 10 mLs by mouth 4 (four) times daily as needed for cough.      EASY COMFORT LANCETS MISC TO test blood glucose ONCE daily     famotidine (PEPCID) 20 MG tablet TAKE 1  TABLET BY MOUTH EVERY DAY AFTER SUPPER. 30 tablet 3   furosemide (LASIX) 20 MG tablet Take 20 mg by mouth daily as needed.     Lancet Devices (ADJUSTABLE LANCING DEVICE) MISC USE TO check blood glucose     levothyroxine (SYNTHROID, LEVOTHROID) 25 MCG tablet Take 25 mcg by mouth daily before breakfast.     metoprolol tartrate (LOPRESSOR) 50 MG tablet TAKE 1 & 1/2 TABLETS TWICE DAILY. 270 tablet 3   pantoprazole (PROTONIX) 40 MG tablet Take 1 tablet (40 mg total) by mouth daily. 28 tablet 4   No current facility-administered medications for this visit.   Allergies:  Chocolate   ROS: Hearing loss.  Physical Exam: VS:  BP 140/78   Pulse 63   Wt 99 lb 12.8 oz (45.3 kg)   SpO2 99%   BMI  17.13 kg/m , BMI Body mass index is 17.13 kg/m.  Wt Readings from Last 3 Encounters:  11/19/21 99 lb 12.8 oz (45.3 kg)  05/14/21 100 lb 6.4 oz (45.5 kg)  04/14/20 103 lb (46.7 kg)    General: Patient appears comfortable at rest. HEENT: Conjunctiva and lids normal, oropharynx clear. Neck: Supple, no elevated JVP or carotid bruits, no thyromegaly. Lungs: Clear to auscultation, nonlabored breathing at rest. Cardiac: Regular rate and rhythm, no S3, 2/6 systolic murmur. Extremities: No pitting edema.  ECG:  An ECG dated 05/14/2021 was personally reviewed today and demonstrated:  Sinus rhythm with IVCD.  Recent Labwork:  May 2023: Cholesterol 172, HDL 83, triglycerides 53, LDL 76, BUN 29, creatinine 1.02, potassium 4.7, AST 24, ALT 15, TSH 7.11, hemoglobin 11.4, platelets 208  Other Studies Reviewed Today:  Echocardiogram 09/27/2019:  1. Left ventricular ejection fraction, by estimation, is 50 to 55%. The  left ventricle has low normal function. The left ventricle has no regional  wall motion abnormalities. Left ventricular diastolic parameters are  consistent with Grade II diastolic  dysfunction (pseudonormalization). Elevated left atrial pressure.   2. Right ventricular systolic function is low normal. The right  ventricular size is mildly enlarged. There is severely elevated pulmonary  artery systolic pressure.   3. Left atrial size was severely dilated.   4. Right atrial size was mildly dilated.   5. The mitral valve is degenerative. Severe mitral valve regurgitation.   6. The tricuspid valve is degenerative. Tricuspid valve regurgitation is  moderate to severe.   7. The aortic valve is tricuspid. Aortic valve regurgitation is mild.  Mild aortic valve sclerosis is present, with no evidence of aortic valve  stenosis.   8. The inferior vena cava is dilated in size with >50% respiratory  variability, suggesting right atrial pressure of 8 mmHg.   Assessment and Plan:  1.   Paroxysmal to persistent atrial fibrillation and flutter.  Her CHA2DS2-VASc score is at least 6.  As discussed above we have taken her off amiodarone due to development of hypothyroidism, subsequent TSH improved significantly.  Plan to continue Lopressor and Eliquis.  I reviewed her most recent lab work from PCP.  2.  Essential hypertension, continue Lopressor and clonidine.  3.  Valvular heart disease with severe mitral regurgitation and moderate to severe tricuspid regurgitation with pulmonary hypertension.  Following conservatively.  Medication Adjustments/Labs and Tests Ordered: Current medicines are reviewed at length with the patient today.  Concerns regarding medicines are outlined above.   Tests Ordered: No orders of the defined types were placed in this encounter.   Medication Changes: Meds ordered this encounter  Medications  pantoprazole (PROTONIX) 40 MG tablet    Sig: Take 1 tablet (40 mg total) by mouth daily.    Dispense:  28 tablet    Refill:  4    Disposition:  Follow up  6 months.  Signed, Jonelle Sidle, MD, [facility] 11/19/2021 2:28 PM    [facility] [address] [place], [place], [place] [address] Phone: [phone]; Fax: [phone]

## 2021-11-19 NOTE — Patient Instructions (Signed)

## 2021-12-17 ENCOUNTER — Other Ambulatory Visit: Payer: Self-pay | Admitting: Cardiology

## 2021-12-17 NOTE — Telephone Encounter (Signed)
Prescription refill request for Eliquis received. Indication: afib  Last office visit: 11/19/2021 Scr: 1.02, 11/06/2021 Age: 88 yo  Weight: 45.3  Refill sent.

## 2022-01-18 ENCOUNTER — Emergency Department (HOSPITAL_COMMUNITY): Payer: Medicare Other

## 2022-01-18 ENCOUNTER — Inpatient Hospital Stay (HOSPITAL_COMMUNITY)
Admission: EM | Admit: 2022-01-18 | Discharge: 2022-01-26 | DRG: 480 | Disposition: A | Discharge status: Skilled Nursing Facility | Payer: Medicare Other | Attending: Internal Medicine | Admitting: Internal Medicine

## 2022-01-18 ENCOUNTER — Other Ambulatory Visit: Payer: Self-pay

## 2022-01-18 ENCOUNTER — Encounter (HOSPITAL_COMMUNITY): Payer: Self-pay

## 2022-01-18 DIAGNOSIS — W19XXXA Unspecified fall, initial encounter: Secondary | ICD-10-CM

## 2022-01-18 DIAGNOSIS — I5043 Acute on chronic combined systolic (congestive) and diastolic (congestive) heart failure: Secondary | ICD-10-CM | POA: Diagnosis present

## 2022-01-18 DIAGNOSIS — I110 Hypertensive heart disease with heart failure: Secondary | ICD-10-CM | POA: Diagnosis present

## 2022-01-18 DIAGNOSIS — I429 Cardiomyopathy, unspecified: Secondary | ICD-10-CM | POA: Diagnosis not present

## 2022-01-18 DIAGNOSIS — I4819 Other persistent atrial fibrillation: Secondary | ICD-10-CM | POA: Diagnosis present

## 2022-01-18 DIAGNOSIS — J449 Chronic obstructive pulmonary disease, unspecified: Secondary | ICD-10-CM | POA: Diagnosis present

## 2022-01-18 DIAGNOSIS — K219 Gastro-esophageal reflux disease without esophagitis: Secondary | ICD-10-CM | POA: Diagnosis present

## 2022-01-18 DIAGNOSIS — I5041 Acute combined systolic (congestive) and diastolic (congestive) heart failure: Secondary | ICD-10-CM | POA: Diagnosis not present

## 2022-01-18 DIAGNOSIS — Z87891 Personal history of nicotine dependence: Secondary | ICD-10-CM

## 2022-01-18 DIAGNOSIS — E785 Hyperlipidemia, unspecified: Secondary | ICD-10-CM | POA: Diagnosis present

## 2022-01-18 DIAGNOSIS — I272 Pulmonary hypertension, unspecified: Secondary | ICD-10-CM | POA: Diagnosis present

## 2022-01-18 DIAGNOSIS — Z79890 Hormone replacement therapy (postmenopausal): Secondary | ICD-10-CM

## 2022-01-18 DIAGNOSIS — I447 Left bundle-branch block, unspecified: Secondary | ICD-10-CM | POA: Diagnosis present

## 2022-01-18 DIAGNOSIS — I5032 Chronic diastolic (congestive) heart failure: Secondary | ICD-10-CM | POA: Diagnosis not present

## 2022-01-18 DIAGNOSIS — I081 Rheumatic disorders of both mitral and tricuspid valves: Secondary | ICD-10-CM | POA: Diagnosis present

## 2022-01-18 DIAGNOSIS — Z681 Body mass index (BMI) 19 or less, adult: Secondary | ICD-10-CM | POA: Diagnosis not present

## 2022-01-18 DIAGNOSIS — E042 Nontoxic multinodular goiter: Secondary | ICD-10-CM | POA: Diagnosis present

## 2022-01-18 DIAGNOSIS — S0990XA Unspecified injury of head, initial encounter: Secondary | ICD-10-CM | POA: Diagnosis present

## 2022-01-18 DIAGNOSIS — E039 Hypothyroidism, unspecified: Secondary | ICD-10-CM | POA: Diagnosis present

## 2022-01-18 DIAGNOSIS — W01190A Fall on same level from slipping, tripping and stumbling with subsequent striking against furniture, initial encounter: Secondary | ICD-10-CM | POA: Diagnosis present

## 2022-01-18 DIAGNOSIS — R1312 Dysphagia, oropharyngeal phase: Secondary | ICD-10-CM | POA: Diagnosis present

## 2022-01-18 DIAGNOSIS — E119 Type 2 diabetes mellitus without complications: Secondary | ICD-10-CM | POA: Diagnosis present

## 2022-01-18 DIAGNOSIS — F419 Anxiety disorder, unspecified: Secondary | ICD-10-CM | POA: Diagnosis present

## 2022-01-18 DIAGNOSIS — E611 Iron deficiency: Secondary | ICD-10-CM | POA: Diagnosis present

## 2022-01-18 DIAGNOSIS — Z01810 Encounter for preprocedural cardiovascular examination: Secondary | ICD-10-CM | POA: Diagnosis not present

## 2022-01-18 DIAGNOSIS — R636 Underweight: Secondary | ICD-10-CM | POA: Diagnosis present

## 2022-01-18 DIAGNOSIS — I4811 Longstanding persistent atrial fibrillation: Secondary | ICD-10-CM | POA: Diagnosis not present

## 2022-01-18 DIAGNOSIS — S72001A Fracture of unspecified part of neck of right femur, initial encounter for closed fracture: Secondary | ICD-10-CM | POA: Diagnosis present

## 2022-01-18 DIAGNOSIS — Z66 Do not resuscitate: Secondary | ICD-10-CM | POA: Diagnosis present

## 2022-01-18 DIAGNOSIS — Z9109 Other allergy status, other than to drugs and biological substances: Secondary | ICD-10-CM

## 2022-01-18 DIAGNOSIS — Z79899 Other long term (current) drug therapy: Secondary | ICD-10-CM

## 2022-01-18 DIAGNOSIS — I4892 Unspecified atrial flutter: Secondary | ICD-10-CM | POA: Diagnosis present

## 2022-01-18 DIAGNOSIS — F32A Depression, unspecified: Secondary | ICD-10-CM | POA: Diagnosis present

## 2022-01-18 DIAGNOSIS — R54 Age-related physical debility: Secondary | ICD-10-CM | POA: Diagnosis present

## 2022-01-18 DIAGNOSIS — Z8249 Family history of ischemic heart disease and other diseases of the circulatory system: Secondary | ICD-10-CM

## 2022-01-18 DIAGNOSIS — D62 Acute posthemorrhagic anemia: Secondary | ICD-10-CM | POA: Diagnosis not present

## 2022-01-18 DIAGNOSIS — Z602 Problems related to living alone: Secondary | ICD-10-CM | POA: Diagnosis present

## 2022-01-18 DIAGNOSIS — I471 Supraventricular tachycardia: Secondary | ICD-10-CM | POA: Diagnosis present

## 2022-01-18 DIAGNOSIS — Z831 Family history of other infectious and parasitic diseases: Secondary | ICD-10-CM

## 2022-01-18 DIAGNOSIS — S72141A Displaced intertrochanteric fracture of right femur, initial encounter for closed fracture: Principal | ICD-10-CM | POA: Diagnosis present

## 2022-01-18 DIAGNOSIS — R64 Cachexia: Secondary | ICD-10-CM | POA: Diagnosis present

## 2022-01-18 DIAGNOSIS — Z7901 Long term (current) use of anticoagulants: Secondary | ICD-10-CM

## 2022-01-18 DIAGNOSIS — I4891 Unspecified atrial fibrillation: Secondary | ICD-10-CM | POA: Diagnosis present

## 2022-01-18 DIAGNOSIS — R1314 Dysphagia, pharyngoesophageal phase: Secondary | ICD-10-CM | POA: Diagnosis present

## 2022-01-18 DIAGNOSIS — Z800 Family history of malignant neoplasm of digestive organs: Secondary | ICD-10-CM

## 2022-01-18 DIAGNOSIS — I480 Paroxysmal atrial fibrillation: Secondary | ICD-10-CM | POA: Diagnosis not present

## 2022-01-18 LAB — TROPONIN I (HIGH SENSITIVITY)
Troponin I (High Sensitivity): 17 ng/L (ref <18)
Troponin I (High Sensitivity): 17 ng/L (ref <18)

## 2022-01-18 LAB — BASIC METABOLIC PANEL
Anion gap: 8 (ref 5–15)
BUN: 22 mg/dL (ref 8–23)
CO2: 24 mmol/L (ref 22–32)
Calcium: 9.2 mg/dL (ref 8.9–10.3)
Chloride: 104 mmol/L (ref 98–111)
Creatinine, Ser: 0.86 mg/dL (ref 0.44–1.00)
GFR, Estimated: >60 mL/min (ref >60)
Glucose, Bld: 129 mg/dL — ABNORMAL HIGH (ref 70–99)
Potassium: 4.3 mmol/L (ref 3.5–5.1)
Sodium: 136 mmol/L (ref 135–145)

## 2022-01-18 LAB — CBC WITH DIFFERENTIAL/PLATELET
Abs Immature Granulocytes: 0.02 10*3/uL (ref 0.00–0.07)
Basophils Absolute: 0 10*3/uL (ref 0.0–0.1)
Basophils Relative: 1 %
Eosinophils Absolute: 0 10*3/uL (ref 0.0–0.5)
Eosinophils Relative: 0 %
HCT: 33.2 % — ABNORMAL LOW (ref 36.0–46.0)
Hemoglobin: 10.7 g/dL — ABNORMAL LOW (ref 12.0–15.0)
Immature Granulocytes: 0 %
Lymphocytes Relative: 10 %
Lymphs Abs: 0.5 10*3/uL — ABNORMAL LOW (ref 0.7–4.0)
MCH: 29.6 pg (ref 26.0–34.0)
MCHC: 32.2 g/dL (ref 30.0–36.0)
MCV: 92 fL (ref 80.0–100.0)
Monocytes Absolute: 0.2 10*3/uL (ref 0.1–1.0)
Monocytes Relative: 3 %
Neutro Abs: 4.1 10*3/uL (ref 1.7–7.7)
Neutrophils Relative %: 86 %
Platelets: 163 10*3/uL (ref 150–400)
RBC: 3.61 MIL/uL — ABNORMAL LOW (ref 3.87–5.11)
RDW: 14.4 % (ref 11.5–15.5)
WBC: 4.8 10*3/uL (ref 4.0–10.5)
nRBC: 0 % (ref 0.0–0.2)

## 2022-01-18 LAB — TYPE AND SCREEN
ABO/RH(D): B POS
Antibody Screen: NEGATIVE

## 2022-01-18 LAB — PROTIME-INR
INR: 1.4 — ABNORMAL HIGH (ref 0.8–1.2)
Prothrombin Time: 17.3 seconds — ABNORMAL HIGH (ref 11.4–15.2)

## 2022-01-18 MED ORDER — PANTOPRAZOLE SODIUM 40 MG PO TBEC
40.0000 mg | DELAYED_RELEASE_TABLET | Freq: Every day | ORAL | Status: DC
  Administered 2022-01-19 – 2022-01-23 (×4): 40 mg via ORAL
  Filled 2022-01-18 (×5): qty 1

## 2022-01-18 MED ORDER — HYDROCODONE-ACETAMINOPHEN 5-325 MG PO TABS
1.0000 | ORAL_TABLET | Freq: Four times a day (QID) | ORAL | Status: DC | PRN
  Administered 2022-01-19 – 2022-01-21 (×3): 2 via ORAL
  Administered 2022-01-21: 1 via ORAL
  Administered 2022-01-22 – 2022-01-24 (×4): 2 via ORAL
  Filled 2022-01-18 (×3): qty 2
  Filled 2022-01-18: qty 1
  Filled 2022-01-18 (×4): qty 2

## 2022-01-18 MED ORDER — CLONIDINE HCL 0.1 MG PO TABS
0.1000 mg | ORAL_TABLET | Freq: Two times a day (BID) | ORAL | Status: DC
  Administered 2022-01-18: 0.1 mg via ORAL
  Filled 2022-01-18: qty 1

## 2022-01-18 MED ORDER — FENTANYL CITRATE PF 50 MCG/ML IJ SOSY
50.0000 ug | PREFILLED_SYRINGE | INTRAMUSCULAR | Status: AC | PRN
  Administered 2022-01-18 – 2022-01-19 (×2): 50 ug via INTRAVENOUS
  Filled 2022-01-18 (×2): qty 1

## 2022-01-18 MED ORDER — ALPRAZOLAM 0.25 MG PO TABS
0.2500 mg | ORAL_TABLET | Freq: Every evening | ORAL | Status: DC | PRN
  Administered 2022-01-19 – 2022-01-23 (×2): 0.25 mg via ORAL
  Filled 2022-01-18 (×2): qty 1

## 2022-01-18 MED ORDER — HEPARIN SODIUM (PORCINE) 5000 UNIT/ML IJ SOLN
5000.0000 [IU] | Freq: Three times a day (TID) | INTRAMUSCULAR | Status: DC
  Administered 2022-01-19 – 2022-01-20 (×4): 5000 [IU] via SUBCUTANEOUS
  Filled 2022-01-18 (×4): qty 1

## 2022-01-18 MED ORDER — ALBUTEROL SULFATE (2.5 MG/3ML) 0.083% IN NEBU: 3.0000 mL | INHALATION_SOLUTION | RESPIRATORY_TRACT | Status: DC | PRN

## 2022-01-18 MED ORDER — SODIUM CHLORIDE 0.9 % IV SOLN: INTRAVENOUS | Status: DC

## 2022-01-18 MED ORDER — ACETAMINOPHEN 325 MG PO TABS: 650.0000 mg | ORAL_TABLET | ORAL | Status: DC | PRN

## 2022-01-18 MED ORDER — ONDANSETRON HCL 4 MG/2ML IJ SOLN
4.0000 mg | Freq: Once | INTRAMUSCULAR | Status: AC
Start: 2022-01-18 — End: 2022-01-18
  Administered 2022-01-18: 4 mg via INTRAVENOUS
  Filled 2022-01-18: qty 2

## 2022-01-18 MED ORDER — LEVOTHYROXINE SODIUM 25 MCG PO TABS
25.0000 ug | ORAL_TABLET | Freq: Every day | ORAL | Status: DC
  Administered 2022-01-19 – 2022-01-26 (×8): 25 ug via ORAL
  Filled 2022-01-18 (×9): qty 1

## 2022-01-18 MED ORDER — MORPHINE SULFATE (PF) 2 MG/ML IV SOLN
0.5000 mg | INTRAVENOUS | Status: DC | PRN
  Administered 2022-01-19 (×2): 0.5 mg via INTRAVENOUS
  Filled 2022-01-18 (×2): qty 1

## 2022-01-18 MED ORDER — METOPROLOL TARTRATE 50 MG PO TABS
75.0000 mg | ORAL_TABLET | Freq: Two times a day (BID) | ORAL | Status: DC
  Administered 2022-01-18 – 2022-01-21 (×4): 75 mg via ORAL
  Filled 2022-01-18 (×5): qty 1

## 2022-01-18 NOTE — ED Notes (Signed)
Hospitalist has been made aware of pt continuing to be tachycardic after meds given. No new orders at this time.

## 2022-01-18 NOTE — ED Provider Notes (Signed)
[facility] EMERGENCY DEPARTMENT Provider Note   CSN: [id number] Arrival date & time: 01/18/22  1600     History Chief Complaint  Patient presents with   Fall    [name] is a 88 y.o. female patient who presents to the emergency department for further evaluation of a fall and subsequent head injury.  Patient states that she was doing the dishes and turned around and she fell.  She does not know if she lost her balance but she denies any prodromal symptoms including chest pain, dizziness, lightheadedness, shortness of breath.  Patient did strike the right side of her head against something.  She did not lose consciousness.  She also been complaining of right shoulder and right hip pain since the fall.  She denies any other injury.   Fall       Home Medications Prior to Admission medications   Medication Sig Start Date End Date Taking? Authorizing Provider  ACCU-CHEK AVIVA PLUS test strip 1 each daily. 01/01/20   [provider]  acetaminophen (TYLENOL) 650 MG CR tablet Take 650 mg by mouth as needed for pain.    [provider]  albuterol (VENTOLIN HFA) 108 (90 Base) MCG/ACT inhaler Inhale 1-2 puffs into the lungs every 4 (four) hours as needed for wheezing or shortness of breath. 05/12/20   Nyoka Cowden, MD  ALPRAZolam Prudy Feeler) 0.25 MG tablet Take 0.25 mg by mouth at bedtime as needed for anxiety.    [provider]  apixaban (ELIQUIS) 2.5 MG TABS tablet TAKE (1) TABLET TWICE DAILY. 12/17/21   Jonelle Sidle, MD  atorvastatin (LIPITOR) 40 MG tablet Take 40 mg by mouth daily at 6 PM. 09/27/11   Jonelle Sidle, MD  bisacodyl (DULCOLAX) 5 MG EC tablet Take 5 mg by mouth daily as needed for moderate constipation.    [provider]  cetirizine (ZYRTEC) 10 MG tablet Take 5-10 mg by mouth at bedtime.    [provider]  cloNIDine (CATAPRES) 0.1 MG tablet TAKE 1 TABLET BY MOUTH UP TO THREE TIMES DAILY IF BLOOD PRESSURE IS GREATER  THAN 140/90 07/11/18   [provider]  COD LIVER OIL PO Take 15 mLs by mouth every other day.     [provider]  dextromethorphan 15 MG/5ML syrup Take 10 mLs by mouth 4 (four) times daily as needed for cough.     [provider]  EASY COMFORT LANCETS MISC TO test blood glucose ONCE daily 07/11/18   [provider]  famotidine (PEPCID) 20 MG tablet TAKE 1 TABLET BY MOUTH EVERY DAY AFTER SUPPER. 04/08/21   Nyoka Cowden, MD  furosemide (LASIX) 20 MG tablet Take 20 mg by mouth daily as needed.    [provider]  Lancet Devices (ADJUSTABLE LANCING DEVICE) MISC USE TO check blood glucose 04/24/18   [provider]  levothyroxine (SYNTHROID, LEVOTHROID) 25 MCG tablet Take 25 mcg by mouth daily before breakfast.    [provider]  metoprolol tartrate (LOPRESSOR) 50 MG tablet TAKE 1 & 1/2 TABLETS TWICE DAILY. 11/17/21   Jonelle Sidle, MD  pantoprazole (PROTONIX) 40 MG tablet Take 1 tablet (40 mg total) by mouth daily. 11/19/21   Jonelle Sidle, MD      Allergies    Chocolate    Review of Systems   Review of Systems  All other systems reviewed and are negative.   Physical Exam Updated Vital Signs BP (!) 172/91   Pulse Marland Kitchen)  109   Temp (!) 97.3 F (36.3 C) (Oral)   Resp 18   Ht 5\' 5"  (1.651 m)   Wt 45.8 kg   SpO2 99%   BMI 16.81 kg/m  Physical Exam Vitals and nursing note reviewed.  Constitutional:      General: She is not in acute distress.    Appearance: Normal appearance.  HENT:     Head: Normocephalic.   Eyes:     General:        Right eye: No discharge.        Left eye: No discharge.  Cardiovascular:     Comments: Regular rate and rhythm.  S1/S2 are distinct without any evidence of murmur, rubs, or gallops.  Radial pulses are 2+ bilaterally.  Dorsalis pedis pulses are 2+ bilaterally.  No evidence of pedal edema. Pulmonary:     Comments: Clear to auscultation bilaterally.  Normal effort.  No respiratory  distress.  No evidence of wheezes, rales, or rhonchi heard throughout. Abdominal:     General: Abdomen is flat. Bowel sounds are normal. There is no distension.     Tenderness: There is no abdominal tenderness. There is no guarding or rebound.  Musculoskeletal:        General: Normal range of motion.     Cervical back: Neck supple.     Comments: Tenderness over the right shoulder and right hip.  Neurologically intact in both joints and distally.  Good cap refill in the fingers and toes.  Skin:    General: Skin is warm and dry.     Findings: No rash.  Neurological:     General: No focal deficit present.     Mental Status: She is alert.  Psychiatric:        Mood and Affect: Mood normal.        Behavior: Behavior normal.     ED Results / Procedures / Treatments   Labs (all labs ordered are listed, but only abnormal results are displayed) Labs Reviewed  CBC WITH DIFFERENTIAL/PLATELET - Abnormal; Notable for the following components:      Result Value   RBC 3.61 (*)    Hemoglobin 10.7 (*)    HCT 33.2 (*)    Lymphs Abs 0.5 (*)    All other components within normal limits  BASIC METABOLIC PANEL - Abnormal; Notable for the following components:   Glucose, Bld 129 (*)    All other components within normal limits  PROTIME-INR - Abnormal; Notable for the following components:   Prothrombin Time 17.3 (*)    INR 1.4 (*)    All other components within normal limits  URINALYSIS, ROUTINE W REFLEX MICROSCOPIC  TYPE AND SCREEN    EKG EKG Interpretation  Date/Time:  Monday January 18 2022 16:27:52 EDT Ventricular Rate:  111 PR Interval:  169 QRS Duration: 116 QT Interval:  365 QTC Calculation: 496 R Axis:   95 Text Interpretation: Ectopic atrial tachycardia, unifocal Probable lateral infarct, age indeterminate Probable anterior infarct, age indeterminate Artifact in lead(s) I III aVR aVL V1 and baseline wander in lead(s) II III aVF No significant change since last tracing Confirmed by  [id number] [phone]) on 01/18/2022 4:31:20 PM  Radiology DG Shoulder Right  Result Date: 01/18/2022 CLINICAL DATA:  Fall EXAM: RIGHT SHOULDER - 2+ VIEW COMPARISON:  None Available. FINDINGS: No fracture or dislocation is seen. The joint spaces are preserved. The visualized soft tissues are unremarkable. Visualized right lung is clear. IMPRESSION: Negative. Electronically Signed   By:  Charline Bills M.D.   On: 01/18/2022 17:37   DG Hip Unilat W or Wo Pelvis 2-3 Views Right  Result Date: 01/18/2022 CLINICAL DATA:  Fall, right hip pain EXAM: DG HIP (WITH OR WITHOUT PELVIS) 2-3V RIGHT COMPARISON:  None Available. FINDINGS: Intertrochanteric right hip fracture with mild foreshortening and varus angulation. Bilateral hip joint spaces are preserved. Visualized bony pelvis appears intact. Mild degenerative changes of the lower lumbar spine. IMPRESSION: Intertrochanteric right hip fracture, as above. Electronically Signed   By: Charline Bills M.D.   On: 01/18/2022 17:36   CT Head Wo Contrast  Result Date: 01/18/2022 CLINICAL DATA:  Fall EXAM: CT HEAD WITHOUT CONTRAST CT CERVICAL SPINE WITHOUT CONTRAST TECHNIQUE: Multidetector CT imaging of the head and cervical spine was performed following the standard protocol without intravenous contrast. Multiplanar CT image reconstructions of the cervical spine were also generated. RADIATION DOSE REDUCTION: This exam was performed according to the departmental dose-optimization program which includes automated exposure control, adjustment of the mA and/or kV according to patient size and/or use of iterative reconstruction technique. COMPARISON:  None Available. FINDINGS: CT HEAD FINDINGS Brain: No evidence of acute infarction, hemorrhage, hydrocephalus, extra-axial collection or mass lesion/mass effect. Vascular: Intracranial atherosclerosis. Skull: Normal. Negative for fracture or focal lesion. Sinuses/Orbits: The visualized paranasal sinuses are essentially clear.  The mastoid air cells are unopacified. Other: None. CT CERVICAL SPINE FINDINGS Alignment: Normal cervical lordosis. Skull base and vertebrae: No acute fracture. No primary bone lesion or focal pathologic process. Soft tissues and spinal canal: No prevertebral fluid or swelling. No visible canal hematoma. Disc levels: Intervertebral disc spaces are maintained. Spinal canal is patent. Upper chest: Visualized lung apices are clear. Other: Multiple left thyroid nodules, measuring up to 18 mm, previously evaluated on thyroid ultrasound. This has been evaluated on previous imaging. (ref: J Am Coll Radiol. 2015 Feb;12(2): 143-50). IMPRESSION: Normal head CT. Normal cervical spine CT. Electronically Signed   By: Charline Bills M.D.   On: 01/18/2022 17:32   CT Cervical Spine Wo Contrast  Result Date: 01/18/2022 CLINICAL DATA:  Fall EXAM: CT HEAD WITHOUT CONTRAST CT CERVICAL SPINE WITHOUT CONTRAST TECHNIQUE: Multidetector CT imaging of the head and cervical spine was performed following the standard protocol without intravenous contrast. Multiplanar CT image reconstructions of the cervical spine were also generated. RADIATION DOSE REDUCTION: This exam was performed according to the departmental dose-optimization program which includes automated exposure control, adjustment of the mA and/or kV according to patient size and/or use of iterative reconstruction technique. COMPARISON:  None Available. FINDINGS: CT HEAD FINDINGS Brain: No evidence of acute infarction, hemorrhage, hydrocephalus, extra-axial collection or mass lesion/mass effect. Vascular: Intracranial atherosclerosis. Skull: Normal. Negative for fracture or focal lesion. Sinuses/Orbits: The visualized paranasal sinuses are essentially clear. The mastoid air cells are unopacified. Other: None. CT CERVICAL SPINE FINDINGS Alignment: Normal cervical lordosis. Skull base and vertebrae: No acute fracture. No primary bone lesion or focal pathologic process. Soft  tissues and spinal canal: No prevertebral fluid or swelling. No visible canal hematoma. Disc levels: Intervertebral disc spaces are maintained. Spinal canal is patent. Upper chest: Visualized lung apices are clear. Other: Multiple left thyroid nodules, measuring up to 18 mm, previously evaluated on thyroid ultrasound. This has been evaluated on previous imaging. (ref: J Am Coll Radiol. 2015 Feb;12(2): 143-50). IMPRESSION: Normal head CT. Normal cervical spine CT. Electronically Signed   By: Charline Bills M.D.   On: 01/18/2022 17:32    Procedures Procedures    Medications Ordered in ED Medications  0.9 %  sodium chloride infusion ( Intravenous New Bag/Given 01/18/22 1800)  fentaNYL (SUBLIMAZE) injection 50 mcg (50 mcg Intravenous Given 01/18/22 1800)  metoprolol tartrate (LOPRESSOR) tablet 75 mg (has no administration in time range)  ondansetron (ZOFRAN) injection 4 mg (4 mg Intravenous Given 01/18/22 1800)    ED Course/ Medical Decision Making/ A&P Clinical Course as of 01/18/22 1857  Mon Jan 18, 2022  1826 CBC with Differential(!) No evidence of leukocytosis.  There is anemia but seems to be at the baseline for the patient. [CF]  1826 Basic metabolic panel(!) BMP is normal apart from slightly elevated glucose. [CF]  1826 DG Shoulder Right Personally ordered and interpreted this study do not see any evidence of acute fracture.  I do agree with the radiologist interpretation. [CF]  1826 DG Hip Unilat W or Wo Pelvis 2-3 Views Right I personally ordered and interpreted this study.  There appears to be a right femur fracture.  I do agree with the radiologist interpretation. [CF]  1827 I personally ordered and interpreted CT imaging over the head and cervical spine.  I do not see any evidence of intracranial hemorrhage or fractures in the spine.  I do agree with the radiologist interpretation. [CF]  1828 I discussed this case with my attending physician who cosigned this note including  patient's presenting symptoms, physical exam, and planned diagnostics and interventions. Attending physician stated agreement with plan or made changes to plan which were implemented.   Attending physician assessed patient at bedside.   [CF]  1837 I discussed the case with Dr. Susa Simmonds with orthopedics and he recommends sending the patient down to [facility] and admit to the hospitalist.  He agrees to consult. [CF]  1851 I spoke with Dr. Mahala Menghini with [facility] hospital[facility] who agrees to admit the patient over at [facility].  [CF]    Clinical Course User Index [CF] Teressa Lower, PA-C                           Medical Decision Making Jasmyn BRYAH OCHELTREE is a 88 y.o. female patient who presents to the emergency department today for further evaluation of a fall.  I have a low suspicion for cardiac or cerebral causes of her fall.  This seems like a mechanical fall.  We will get basic labs and get imaging over the head, neck, right shoulder, and right hip to further evaluate.  I will plan to reassess.  Based on the hip fracture, I do feel that the patient would benefit from further evaluation in the hospital.  I spoke with orthopedics and [facility] hospitalist which is highlighted in ED course for admission and consultation.  Plan will be to get her over to [facility] for surgery.  Amount and/or Complexity of Data Reviewed Labs: ordered. Decision-making details documented in ED Course. Radiology: ordered. Decision-making details documented in ED Course.  Risk Prescription drug management. Decision regarding hospitalization.    Final Clinical Impression(s) / ED Diagnoses Final diagnoses:  Closed fracture of right hip, initial encounter [facility])  Fall, initial encounter    Rx / DC Orders ED Discharge Orders     None         Jolyn Lent 01/18/22 1857    Jacalyn Lefevre, MD 01/18/22 [phone]

## 2022-01-18 NOTE — ED Notes (Addendum)
Pt can not swallow pills, they are to be crushed and in apple sauce   Only small sips of water at a time

## 2022-01-18 NOTE — ED Notes (Signed)
Patient transported to CT 

## 2022-01-18 NOTE — H&P (Addendum)
HPI  [name] SVX:[id number] DOB: Jan 02, 1927 DOA: 01/18/2022  PCP: John Giovanni, MD   Chief Complaint: Fall  HPI:  88 year old black female (very active at baseline still lives alone not driving but cooks 3 square meals a day takes medications out of a pouch has mainly hardness of hearing but no significant dementia) Persistent a flutter CHADVASC 6 now off amiodarone, HTN, severe mitral regurg moderate to severe tricuspid regurg with pulmonary HTN LBBB Presbycusis HFpEF last echo 50 to 55% 09/27/2019  esophageal dilatations 2015 status post evaluation 03/2019 placed on dysphagia 2 DM TY 2 not currently on medications  She is a circuitous historian-it is difficult to completely get the history but her daughter supplements-it seems she was in the kitchen ambulatory turning around and tripped She does not have any history really of any dizziness but it is unclear if this is actually the case She was found to have a hip fracture   Review of Systems:    Pertinent +'s: Has hip fracture and some pain in the right hip Pertinent -"s: - Dizzy, - dark stool, - tarry stool, - chest pain, Lower extremity edema, - unilateral weakness, - cough, - cold, - dysuria  ED Course: Patient was given a dose of fentanyl started on fluids orthopedics was called As she was slightly tachycardic and had probably missed her metoprolol we administered a dose of metoprolol 75   Past Medical History:  Diagnosis Date   Anxiety    Atrial fibrillation (HCC)    COPD (chronic obstructive pulmonary disease) (HCC)    Depression    Essential hypertension    Hyperlipidemia    Hypothyroidism    Mitral regurgitation    Palpitations    PAT/EAT/NSVT, normal LVEF   Type 2 diabetes mellitus (HCC)    Past Surgical History:  Procedure Laterality Date   ESOPHAGOGASTRODUODENOSCOPY N/A 04/17/2014   RMR: 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 hiatal  hernia. Antral erosions status post gastric biopsy.   ESOPHAGOGASTRODUODENOSCOPY N/A 05/12/2016   Dr. Jena Gauss: moderate Schatzki ring s/p dilation. moderate sized hiatal hernia   MALONEY DILATION N/A 04/17/2014   Procedure: [name] DILATION;  Surgeon: Corbin Ade, MD;  Location: [facility];  Service: Endoscopy;  Laterality: N/A;   MALONEY DILATION N/A 05/12/2016   Procedure: [name] DILATION;  Surgeon: Corbin Ade, MD;  Location: [facility];  Service: Endoscopy;  Laterality: N/A;   none      reports that she has quit smoking. She has never used smokeless tobacco. She reports that she does not drink alcohol and does not use drugs.  Mobility: Mostly independent  Allergies  Allergen Reactions   Chocolate     Hives   Family History  Problem Relation Age of Onset   Heart failure Mother        Died in her 36s   Tuberculosis Father        Died at young age   Cancer Brother    Arthritis Other    Colon cancer Other    Prior to Admission medications   Medication Sig Start Date End Date Taking? Authorizing Provider  ACCU-CHEK AVIVA PLUS test strip 1 each daily. 01/01/20   [provider]  acetaminophen (TYLENOL) 650 MG CR tablet Take 650 mg by mouth as needed for pain.    [provider]  albuterol (VENTOLIN HFA) 108 (90 Base) MCG/ACT inhaler Inhale 1-2 puffs into the lungs every 4 (four)  hours as needed for wheezing or shortness of breath. 05/12/20   Nyoka Cowden, MD  ALPRAZolam Prudy Feeler) 0.25 MG tablet Take 0.25 mg by mouth at bedtime as needed for anxiety.    [provider]  apixaban (ELIQUIS) 2.5 MG TABS tablet TAKE (1) TABLET TWICE DAILY. 12/17/21   Jonelle Sidle, MD  atorvastatin (LIPITOR) 40 MG tablet Take 40 mg by mouth daily at 6 PM. 09/27/11   Jonelle Sidle, MD  bisacodyl (DULCOLAX) 5 MG EC tablet Take 5 mg by mouth daily as needed for moderate constipation.    [provider]  cetirizine (ZYRTEC) 10 MG tablet Take 5-10 mg by  mouth at bedtime.    [provider]  cloNIDine (CATAPRES) 0.1 MG tablet TAKE 1 TABLET BY MOUTH UP TO THREE TIMES DAILY IF BLOOD PRESSURE IS GREATER THAN 140/90 07/11/18   [provider]  COD LIVER OIL PO Take 15 mLs by mouth every other day.     [provider]  dextromethorphan 15 MG/5ML syrup Take 10 mLs by mouth 4 (four) times daily as needed for cough.     [provider]  EASY COMFORT LANCETS MISC TO test blood glucose ONCE daily 07/11/18   [provider]  famotidine (PEPCID) 20 MG tablet TAKE 1 TABLET BY MOUTH EVERY DAY AFTER SUPPER. 04/08/21   Nyoka Cowden, MD  furosemide (LASIX) 20 MG tablet Take 20 mg by mouth daily as needed.    [provider]  Lancet Devices (ADJUSTABLE LANCING DEVICE) MISC USE TO check blood glucose 04/24/18   [provider]  levothyroxine (SYNTHROID, LEVOTHROID) 25 MCG tablet Take 25 mcg by mouth daily before breakfast.    [provider]  metoprolol tartrate (LOPRESSOR) 50 MG tablet TAKE 1 & 1/2 TABLETS TWICE DAILY. 11/17/21   Jonelle Sidle, MD  pantoprazole (PROTONIX) 40 MG tablet Take 1 tablet (40 mg total) by mouth daily. 11/19/21   Jonelle Sidle, MD    Physical Exam:  Vitals:   01/18/22 1800 01/18/22 1830  BP: (!) 157/86 (!) 172/91  Pulse: (!) 112 (!) 109  Resp: 12 18  Temp:    SpO2: 100% 99%    Awake senile female pattern baldness Arcus senilis present no icterus no pallor poor dentition Neck soft supple cannot appreciate thyroid to my exam-no submandibular lymphadenopathy S1-S2 tachycardic irregularly irregular Abdomen is scaphoid nontender no rebound no guarding She has a little bit of lower extremity edema right knee is a little swollen left lower extremity moves fairly well Power is 5/5 Skin intact no rashes or excoriations  I have personally reviewed following labs and imaging studies  Labs:  Sodium 136 potassium 4.3 chloride 104 CO2 24 BUN/creatinine  22/0.8 WBC 4.8 hemoglobin 10.7  Imaging studies:  Shoulder x-rays negative Hip x-ray shows right intertrochanteric hip fracture CT of neck is normal other than multiple tiny nodules largest 118 mm  Medical tests:  EKG independently reviewed: Irregularly irregular irregular heart rate with deep R S waves, some reciprocal ST-T wave changes probably related to rate  Test discussed with performing physician: Yes discussed with PA  Decision to obtain old records:  Yes  Review and summation of old records:  Yes  Principal Problem:   A-fib (HCC) Active Problems:   Dysphagia, pharyngoesophageal phase   Diabetes (HCC)   Atrial flutter with rapid ventricular response (HCC)   GERD (gastroesophageal reflux disease)   Dysphagia, oropharyngeal   Closed right hip fracture, initial encounter (HCC)  Assessment/Plan Accidental fall leading to hip fracture No overt concerns for syncope or other cardiac event but to be sure we will add a troponin Patient voiced preference for being seen by Dr. Susa Simmonds and for him to perform surgery he was consulted and he recommends transferring the patient over to [facility] will place on a diet until patient can be seen-it is unclear when surgery will be performed Pain control Norco 1-2 tabs every 6 as needed moderate pain morphine 0.5 every 2 as needed severe pain hold for sedation Saline 125 cc/H has been cut back to 75 cc/H Follow vitamin D25  Persistent A-fib/flutter with RVR Her tachycardia is probably explained by her not taking her p.m. dose of meds and being in pain-we will give 75 mg of metoprolol now she will need to be monitored on telemetry Her Eliquis has been held from admission and she will be on heparin  EKG changes These are probably rate related with regards to her A-fib/flutter but I will get a troponin just in case and this will need to be followed  Hypothyroid Patient is on Synthroid but also has nodular thyroid disease which may  account for hypothyroidism PCP should follow and may be referred the patient to endocrinology in the outpatient setting (patient is relatively high functioning)  Diabetes mellitus type 2-last a1c 5.5 in 2021 Previously on medications however not on anything at this time Monitor trends and if above 200 would need coverage Would not aggressively control given age and risk hypoglycemia  Prior dysphagia-status post scope in 2015 Monitor trends postprocedure-previously has been on dysphagia 2 diet  HTN Can continue clonidine 0.1 twice daily morphine as above-augment meds as needed   Severity of Illness: The appropriate patient status for this patient is INPATIENT. Inpatient status is judged to be reasonable and necessary in order to provide the required intensity of service to ensure the patient's safety. The patient's presenting symptoms, physical exam findings, and initial radiographic and laboratory data in the context of their chronic comorbidities is felt to place them at high risk for further clinical deterioration. Furthermore, it is not anticipated that the patient will be medically stable for discharge from the hospital within 2 midnights of admission.   * I certify that at the point of admission it is my clinical judgment that the patient will require inpatient hospital care spanning beyond 2 midnights from the point of admission due to high intensity of service, high risk for further deterioration and high frequency of surveillance required.*   DVT prophylaxis: Heparin for now Code Status: Full Family Communication: Discussed with both daughters at the bedside Consults called: Dr. Susa Simmonds was consulted by the ED and will see the patient once arrives at: Probably in the morning so patient will not be kept n.p.o.  Time spent: 57 minutes  Mahala Menghini, MD Cordelia Poche my NP partners at night for Care related issues] [facility] --Via amion app OR , [email or web address]; password [email or web address]   01/18/2022, 6:46 PM

## 2022-01-18 NOTE — ED Notes (Signed)
Pt is resting comfortably in bed at this time. Pain assessed and pt states that she is in no pain at this time. Pt vital stable with pulse still being slightly tachycardic.Mentation WNL. No other needs expressed at this time.

## 2022-01-18 NOTE — ED Triage Notes (Signed)
Patient from home, fell in kitchen.  C/o right leg and right hip pain, does have goose egg above right eye  Patient denies getting dizzy, however she doesn't know what made her fall

## 2022-01-19 ENCOUNTER — Inpatient Hospital Stay (HOSPITAL_COMMUNITY): Payer: Medicare Other

## 2022-01-19 ENCOUNTER — Other Ambulatory Visit (HOSPITAL_COMMUNITY): Payer: Self-pay | Admitting: *Deleted

## 2022-01-19 DIAGNOSIS — Z01810 Encounter for preprocedural cardiovascular examination: Secondary | ICD-10-CM | POA: Diagnosis not present

## 2022-01-19 DIAGNOSIS — S72001A Fracture of unspecified part of neck of right femur, initial encounter for closed fracture: Secondary | ICD-10-CM | POA: Diagnosis not present

## 2022-01-19 DIAGNOSIS — I4892 Unspecified atrial flutter: Secondary | ICD-10-CM | POA: Diagnosis not present

## 2022-01-19 DIAGNOSIS — I5032 Chronic diastolic (congestive) heart failure: Secondary | ICD-10-CM | POA: Diagnosis not present

## 2022-01-19 DIAGNOSIS — I480 Paroxysmal atrial fibrillation: Secondary | ICD-10-CM | POA: Diagnosis not present

## 2022-01-19 DIAGNOSIS — I4819 Other persistent atrial fibrillation: Secondary | ICD-10-CM | POA: Diagnosis not present

## 2022-01-19 DIAGNOSIS — R1314 Dysphagia, pharyngoesophageal phase: Secondary | ICD-10-CM

## 2022-01-19 LAB — CBC
HCT: 29.9 % — ABNORMAL LOW (ref 36.0–46.0)
Hemoglobin: 9.7 g/dL — ABNORMAL LOW (ref 12.0–15.0)
MCH: 29.8 pg (ref 26.0–34.0)
MCHC: 32.4 g/dL (ref 30.0–36.0)
MCV: 92 fL (ref 80.0–100.0)
Platelets: 137 10*3/uL — ABNORMAL LOW (ref 150–400)
RBC: 3.25 MIL/uL — ABNORMAL LOW (ref 3.87–5.11)
RDW: 14.5 % (ref 11.5–15.5)
WBC: 4.4 10*3/uL (ref 4.0–10.5)
nRBC: 0 % (ref 0.0–0.2)

## 2022-01-19 LAB — CBG MONITORING, ED: Glucose-Capillary: 110 mg/dL — ABNORMAL HIGH (ref 70–99)

## 2022-01-19 LAB — BASIC METABOLIC PANEL
Anion gap: 7 (ref 5–15)
BUN: 18 mg/dL (ref 8–23)
CO2: 22 mmol/L (ref 22–32)
Calcium: 8.4 mg/dL — ABNORMAL LOW (ref 8.9–10.3)
Chloride: 107 mmol/L (ref 98–111)
Creatinine, Ser: 0.84 mg/dL (ref 0.44–1.00)
GFR, Estimated: >60 mL/min (ref >60)
Glucose, Bld: 115 mg/dL — ABNORMAL HIGH (ref 70–99)
Potassium: 4.4 mmol/L (ref 3.5–5.1)
Sodium: 136 mmol/L (ref 135–145)

## 2022-01-19 LAB — ECHOCARDIOGRAM COMPLETE
Area-P 1/2: 5.38 cm2
Height: 65 in
S' Lateral: 2.4 cm
Weight: 1616 oz

## 2022-01-19 LAB — VITAMIN D 25 HYDROXY (VIT D DEFICIENCY, FRACTURES): Vit D, 25-Hydroxy: 33.66 ng/mL (ref 30–100)

## 2022-01-19 MED ORDER — METOPROLOL TARTRATE 5 MG/5ML IV SOLN
5.0000 mg | INTRAVENOUS | Status: AC
  Administered 2022-01-19: 5 mg via INTRAVENOUS
  Filled 2022-01-19: qty 5

## 2022-01-19 MED ORDER — POVIDONE-IODINE 10 % EX SWAB: 2.0000 | Freq: Once | CUTANEOUS | Status: DC

## 2022-01-19 MED ORDER — CEFAZOLIN SODIUM-DEXTROSE 2-4 GM/100ML-% IV SOLN: 2.0000 g | INTRAVENOUS | Status: DC

## 2022-01-19 MED ORDER — CEFAZOLIN SODIUM-DEXTROSE 2-4 GM/100ML-% IV SOLN
2.0000 g | INTRAVENOUS | Status: AC
  Administered 2022-01-20: 1 g via INTRAVENOUS
  Filled 2022-01-19: qty 100

## 2022-01-19 MED ORDER — CHLORHEXIDINE GLUCONATE 4 % EX LIQD
60.0000 mL | Freq: Once | CUTANEOUS | Status: DC
  Filled 2022-01-19: qty 60

## 2022-01-19 MED ORDER — METOPROLOL TARTRATE 5 MG/5ML IV SOLN: 5.0000 mg | Freq: Once | INTRAVENOUS | Status: DC

## 2022-01-19 MED ORDER — CHLORHEXIDINE GLUCONATE 4 % EX LIQD
60.0000 mL | Freq: Once | CUTANEOUS | Status: AC
  Administered 2022-01-20: 4 via TOPICAL

## 2022-01-19 MED ORDER — DILTIAZEM HCL 25 MG/5ML IV SOLN
10.0000 mg | Freq: Once | INTRAVENOUS | Status: AC
  Administered 2022-01-19: 10 mg via INTRAVENOUS
  Filled 2022-01-19: qty 5

## 2022-01-19 NOTE — Consult Note (Addendum)
Cardiology Consultation   Patient ID: [name] MRN: [id number]; DOB: [birth year]  Admit date: 01/18/2022 Date of Consult: 01/19/2022  PCP:  Leslie Andrea, [place] Prov[facility] Cardiologist:  Rozann Lesches, MD   {   Patient Profile:   [name] is a 88 y.o. female with a hx of persistent A-fib on Eliquis, moderate diastolic dysfunction, severe MR, moderate to severe TR, severe pulmonary hypertension, esophageal dilations, hypertension COPD, hyperlipidemia, LBBB, hypothyroidism, diabetes type 2 who is being seen 01/19/2022 for the evaluation of preop cardiac evaluation at the request of Dr. Carles Collet.  History of Present Illness:   [name] is followed by Dr. Domenic Polite for the above cardiac issues.  She has a long history of A-fib and atypical a flutter.  CHA2DS2-VASc of 6.  She has been on Eliquis for stroke prophylaxis.  She has been on amiodarone in the past. She is on Lopressor for heart rate control.  Patient and family have refused cardioversion in the past.  In 10/2021 amiodarone was stopped due to development of hypothyroidism.   Echocardiogram 2021 showed LVEF 50 to 55%, moderate diastolic dysfunction, progressive valvular disease including severe MR and moderate to severe TR, severe pulmonary hypertension with RVSP 85 mmHg.  The patient presented to the ER 01/18/2022 for a fall.  She also sustained a head injury.  Patient was doing the dishes and turned around and she fell. She must have lost her balance when she turned around.  She denied any prodromal symptoms including lightheadedness, dizziness, shortness of breath, chest pain.  She hit her head against the table when she fell. She also hit her right shoulder. She did not lose consciousness. She called her children, who called EMS.   In the ER blood pressure 172/91, pulse 109, afebrile, respiratory rate 18, 99% O2.  Labs showed hemoglobin 10.7,, WBC 4.8, glucose 129. HS troponin negative x 2. Imaging  showed a right femur fracture.  Imaging of the right shoulder did not show any acute fracture.  CT of the head did not show acute hemorrhage or fracture.  The patient was admitted for further work-up.  Past Medical History:  Diagnosis Date   Anxiety    Atrial fibrillation (HCC)    COPD (chronic obstructive pulmonary disease) ([place])    Depression    Essential hypertension    Hyperlipidemia    Hypothyroidism    Mitral regurgitation    Palpitations    PAT/EAT/NSVT, normal LVEF   Type 2 diabetes mellitus ([place])     Past Surgical History:  Procedure Laterality Date   ESOPHAGOGASTRODUODENOSCOPY N/A 04/17/2014   RMR: 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 hiatal hernia. Antral erosions status post gastric biopsy.   ESOPHAGOGASTRODUODENOSCOPY N/A 05/12/2016   Dr. Gala Romney: moderate Schatzki ring s/p dilation. moderate sized hiatal hernia   MALONEY DILATION N/A 04/17/2014   Procedure: Venia Minks DILATION;  Surgeon: Daneil Dolin, MD;  [facility];  Service: Endoscopy;  Laterality: N/A;   MALONEY DILATION N/A 05/12/2016   Procedure: Venia Minks DILATION;  Surgeon: Daneil Dolin, MD;  Location: [facility];  Service: Endoscopy;  Laterality: N/A;   none       Home Medications:  Prior to Admission medications   Medication Sig Start Date End Date Taking? Authorizing Provider  acetaminophen (TYLENOL) 650 MG CR tablet Take 650 mg by mouth as needed for pain.   Yes [provider]  albuterol (VENTOLIN HFA)  108 (90 Base) MCG/ACT inhaler Inhale 1-2 puffs into the lungs every 4 (four) hours as needed for wheezing or shortness of breath. 05/12/20  Yes Tanda Rockers, MD  ALPRAZolam Duanne Moron) 0.25 MG tablet Take 0.25 mg by mouth at bedtime as needed for anxiety.   Yes [provider]  apixaban (ELIQUIS) 2.5 MG TABS tablet TAKE (1) TABLET TWICE DAILY. Patient taking differently: Take 2.5 mg by mouth 2 (two) times daily.  12/17/21  Yes Satira Sark, MD  atorvastatin (LIPITOR) 40 MG tablet Take 40 mg by mouth daily at 6 PM. 09/27/11  Yes Satira Sark, MD  bisacodyl (DULCOLAX) 5 MG EC tablet Take 5 mg by mouth daily as needed for moderate constipation.   Yes [provider]  cetirizine (ZYRTEC) 10 MG tablet Take 5-10 mg by mouth at bedtime.   Yes [provider]  Cholecalciferol (VITAMIN D3) 25 MCG (1000 UT) CAPS Take by mouth.   Yes [provider]  cloNIDine (CATAPRES) 0.1 MG tablet Take 0.1 mg by mouth daily. For blood pressure greater than 140/90 07/11/18  Yes [provider]  COD LIVER OIL PO Take 15 mLs by mouth every other day.    Yes [provider]  dextromethorphan 15 MG/5ML syrup Take 10 mLs by mouth 4 (four) times daily as needed for cough.    Yes [provider]  famotidine (PEPCID) 20 MG tablet TAKE 1 TABLET BY MOUTH EVERY DAY AFTER SUPPER. Patient taking differently: Take 20 mg by mouth daily. TAKE 1 TABLET BY MOUTH EVERY DAY AFTER SUPPER. 04/08/21  Yes Tanda Rockers, MD  furosemide (LASIX) 20 MG tablet Take 20 mg by mouth daily as needed.   Yes [provider]  levothyroxine (SYNTHROID, LEVOTHROID) 25 MCG tablet Take 25 mcg by mouth daily before breakfast.   Yes [provider]  Magnesium Hydroxide (DULCOLAX PO) Take by mouth.   Yes [provider]  metoprolol tartrate (LOPRESSOR) 50 MG tablet TAKE 1 & 1/2 TABLETS TWICE DAILY. Patient taking differently: 50 mg. 11/17/21  Yes Satira Sark, MD  pantoprazole (PROTONIX) 40 MG tablet Take 1 tablet (40 mg total) by mouth daily. 11/19/21  Yes Satira Sark, MD  ACCU-CHEK AVIVA PLUS test strip 1 each daily. 01/01/20   [provider]  EASY COMFORT LANCETS MISC TO test blood glucose ONCE daily 07/11/18   [provider]  Lancet Devices (ADJUSTABLE LANCING DEVICE) MISC USE TO check blood glucose 04/24/18   [provider]    Inpatient  Medications: Scheduled Meds:  heparin  5,000 Units Subcutaneous Q8H   levothyroxine  25 mcg Oral QAC breakfast   metoprolol tartrate  75 mg Oral BID   pantoprazole  40 mg Oral Daily   Continuous Infusions:  sodium chloride 75 mL/hr at 01/19/22 0459   PRN Meds: acetaminophen, albuterol, ALPRAZolam, fentaNYL (SUBLIMAZE) injection, HYDROcodone-acetaminophen, morphine injection  Allergies:    Allergies  Allergen Reactions   Chocolate     Hives    Social History:   Social History   Socioeconomic History   Marital status: Married    Spouse name: Not on file   Number of children: Not on file   Years of education: 11   Highest education level: Not on file  Occupational History   Occupation: Retired    Fish farm manager: RETIRED  Tobacco Use   Smoking status: Former   Smokeless tobacco: Never  Scientific laboratory technician Use: Never used  Substance and Sexual Activity  Alcohol use: No    Alcohol/week: 0.0 standard drinks of alcohol   Drug use: No   Sexual activity: Never  Other Topics Concern   Not on file  Social History Narrative   Not on file   Social Determinants of Health   Financial Resource Strain: Not on file  Food Insecurity: Not on file  Transportation Needs: Not on file  Physical Activity: Not on file  Stress: Not on file  Social Connections: Not on file  Intimate Partner Violence: Not on file    Family History:    Family History  Problem Relation Age of Onset   Heart failure Mother        Died in her 83s   Tuberculosis Father        Died at young age   Cancer Brother    Arthritis Other    Colon cancer Other      ROS:  Please see the history of present illness.   All other ROS reviewed and negative.     Physical Exam/Data:   Vitals:   01/19/22 0435 01/19/22 0530 01/19/22 0702 01/19/22 0730  BP: 108/67 108/70  106/74  Pulse: (!) 127 (!) 108  (!) 133  Resp: 17 19  (!) 21  Temp:   97.8 F (36.6 C)   TempSrc:   Oral   SpO2: 97% 99%  97%  Weight:       Height:       No intake or output data in the 24 hours ending 01/19/22 0743    01/18/2022    4:08 PM 11/19/2021    2:07 PM 05/14/2021    9:19 AM  Last 3 Weights  Weight (lbs) 101 lb 99 lb 12.8 oz 100 lb 6.4 oz  Weight (kg) 45.813 kg 45.269 kg 45.541 kg     Body mass index is 16.81 kg/m.  General:  frail AAF HEENT: normal Neck: no JVD Vascular: No carotid bruits; Distal pulses 2+ bilaterally Cardiac:  normal S1, S2; Irreg Irreg; + murmur  Lungs:  clear to auscultation bilaterally, no wheezing, rhonchi or rales  Abd: soft, nontender, no hepatomegaly  Ext: no edema Musculoskeletal:  No deformities, BUE and BLE strength normal and equal Skin: warm and dry  Neuro:  CNs 2-12 intact, no focal abnormalities noted Psych:  Normal affect   EKG:  The EKG was personally reviewed and demonstrates:  Afib, 111bpm, LBBB, rate related changes Telemetry:  Telemetry was personally reviewed and demonstrates:  Afib HR up to 140s and down into the 80s  Relevant CV Studies:  Echo 09/2019   1. Left ventricular ejection fraction, by estimation, is 50 to 55%. The  left ventricle has low normal function. The left ventricle has no regional  wall motion abnormalities. Left ventricular diastolic parameters are  consistent with Grade II diastolic  dysfunction (pseudonormalization). Elevated left atrial pressure.   2. Right ventricular systolic function is low normal. The right  ventricular size is mildly enlarged. There is severely elevated pulmonary  artery systolic pressure.   3. Left atrial size was severely dilated.   4. Right atrial size was mildly dilated.   5. The mitral valve is degenerative. Severe mitral valve regurgitation.   6. The tricuspid valve is degenerative. Tricuspid valve regurgitation is  moderate to severe.   7. The aortic valve is tricuspid. Aortic valve regurgitation is mild.  Mild aortic valve sclerosis is present, with no evidence of aortic valve  stenosis.   8. The  inferior vena cava  is dilated in size with >50% respiratory  variability, suggesting right atrial pressure of 8 mmHg.   Comparison(s): Echocardiogram done 07/14/14 showed an EF of 55% with  moderate MR.  Laboratory Data:  High Sensitivity Troponin:   Recent Labs  Lab 01/18/22 1940 01/18/22 2120  TROPONINIHS 17 17     Chemistry Recent Labs  Lab 01/18/22 1656 01/19/22 0326  NA 136 136  K 4.3 4.4  CL 104 107  CO2 24 22  GLUCOSE 129* 115*  BUN 22 18  CREATININE 0.86 0.84  CALCIUM 9.2 8.4*  GFRNONAA >60 >60  ANIONGAP 8 7    No results for input(s): "PROT", "ALBUMIN", "AST", "ALT", "ALKPHOS", "BILITOT" in the last 168 hours. Lipids No results for input(s): "CHOL", "TRIG", "HDL", "LABVLDL", "LDLCALC", "CHOLHDL" in the last 168 hours.  Hematology Recent Labs  Lab 01/18/22 1656 01/19/22 0326  WBC 4.8 4.4  RBC 3.61* 3.25*  HGB 10.7* 9.7*  HCT 33.2* 29.9*  MCV 92.0 92.0  MCH 29.6 29.8  MCHC 32.2 32.4  RDW 14.4 14.5  PLT 163 137*   Thyroid No results for input(s): "TSH", "FREET4" in the last 168 hours.  BNPNo results for input(s): "BNP", "PROBNP" in the last 168 hours.  DDimer No results for input(s): "DDIMER" in the last 168 hours.   Radiology/Studies:  DG Shoulder Right  Result Date: 01/18/2022 CLINICAL DATA:  Fall EXAM: RIGHT SHOULDER - 2+ VIEW COMPARISON:  None Available. FINDINGS: No fracture or dislocation is seen. The joint spaces are preserved. The visualized soft tissues are unremarkable. Visualized right lung is clear. IMPRESSION: Negative. Electronically Signed   By: Charline Bills M.D.   On: 01/18/2022 17:37   DG Hip Unilat W or Wo Pelvis 2-3 Views Right  Result Date: 01/18/2022 CLINICAL DATA:  Fall, right hip pain EXAM: DG HIP (WITH OR WITHOUT PELVIS) 2-3V RIGHT COMPARISON:  None Available. FINDINGS: Intertrochanteric right hip fracture with mild foreshortening and varus angulation. Bilateral hip joint spaces are preserved. Visualized bony pelvis appears  intact. Mild degenerative changes of the lower lumbar spine. IMPRESSION: Intertrochanteric right hip fracture, as above. Electronically Signed   By: Charline Bills M.D.   On: 01/18/2022 17:36   CT Head Wo Contrast  Result Date: 01/18/2022 CLINICAL DATA:  Fall EXAM: CT HEAD WITHOUT CONTRAST CT CERVICAL SPINE WITHOUT CONTRAST TECHNIQUE: Multidetector CT imaging of the head and cervical spine was performed following the standard protocol without intravenous contrast. Multiplanar CT image reconstructions of the cervical spine were also generated. RADIATION DOSE REDUCTION: This exam was performed according to the departmental dose-optimization program which includes automated exposure control, adjustment of the mA and/or kV according to patient size and/or use of iterative reconstruction technique. COMPARISON:  None Available. FINDINGS: CT HEAD FINDINGS Brain: No evidence of acute infarction, hemorrhage, hydrocephalus, extra-axial collection or mass lesion/mass effect. Vascular: Intracranial atherosclerosis. Skull: Normal. Negative for fracture or focal lesion. Sinuses/Orbits: The visualized paranasal sinuses are essentially clear. The mastoid air cells are unopacified. Other: None. CT CERVICAL SPINE FINDINGS Alignment: Normal cervical lordosis. Skull base and vertebrae: No acute fracture. No primary bone lesion or focal pathologic process. Soft tissues and spinal canal: No prevertebral fluid or swelling. No visible canal hematoma. Disc levels: Intervertebral disc spaces are maintained. Spinal canal is patent. Upper chest: Visualized lung apices are clear. Other: Multiple left thyroid nodules, measuring up to 18 mm, previously evaluated on thyroid ultrasound. This has been evaluated on previous imaging. (ref: J Am Coll Radiol. 2015 Feb;12(2): 143-50). IMPRESSION: Normal head  CT. Normal cervical spine CT. Electronically Signed   By: Julian Hy M.D.   On: 01/18/2022 17:32   CT Cervical Spine Wo  Contrast  Result Date: 01/18/2022 CLINICAL DATA:  Fall EXAM: CT HEAD WITHOUT CONTRAST CT CERVICAL SPINE WITHOUT CONTRAST TECHNIQUE: Multidetector CT imaging of the head and cervical spine was performed following the standard protocol without intravenous contrast. Multiplanar CT image reconstructions of the cervical spine were also generated. RADIATION DOSE REDUCTION: This exam was performed according to the departmental dose-optimization program which includes automated exposure control, adjustment of the mA and/or kV according to patient size and/or use of iterative reconstruction technique. COMPARISON:  None Available. FINDINGS: CT HEAD FINDINGS Brain: No evidence of acute infarction, hemorrhage, hydrocephalus, extra-axial collection or mass lesion/mass effect. Vascular: Intracranial atherosclerosis. Skull: Normal. Negative for fracture or focal lesion. Sinuses/Orbits: The visualized paranasal sinuses are essentially clear. The mastoid air cells are unopacified. Other: None. CT CERVICAL SPINE FINDINGS Alignment: Normal cervical lordosis. Skull base and vertebrae: No acute fracture. No primary bone lesion or focal pathologic process. Soft tissues and spinal canal: No prevertebral fluid or swelling. No visible canal hematoma. Disc levels: Intervertebral disc spaces are maintained. Spinal canal is patent. Upper chest: Visualized lung apices are clear. Other: Multiple left thyroid nodules, measuring up to 18 mm, previously evaluated on thyroid ultrasound. This has been evaluated on previous imaging. (ref: J Am Coll Radiol. 2015 Feb;12(2): 143-50). IMPRESSION: Normal head CT. Normal cervical spine CT. Electronically Signed   By: Julian Hy M.D.   On: 01/18/2022 17:32     Assessment and Plan:   Pre-op Evaluation Fall with Right hip fracture Paroxysmal Afib/flutter HFpEF/chronic diastolic heart failure/Valvular disease Patient presents with suspected mechanical fall resulting in right femur fracture,  plan to transfer to [facility] for repair. She denies prodromal symptoms. She has h/o persistent Afib/flutter on Eliquis 2.5mg  BID for stroke ppx for CHADSVASC of 6. Last dose of Eliquis was 8/28 around 11AM. This has been held, plan to cover with IV heparin. Amiodarone stopped previously due to hypothyroidism. Seems she is persistently in Afib. She is on Lopressor 75mg  BID for rate control. Heart rates have been higher due to acute issue, however at rest, heart rates drop into the 80s. She denies anginal symptoms. Echo 2021 showed LVEF 50-55%, G2DD, severe elevated pulmonary artery systolic pressure, severely dilated LA, mildly dilated RA, severe MR, mod to severe TR.  Plan to follow valve disease conservatively. She takes lasix 20mg  for volume management. She is euvolemic on exam. Patient is functional at baseline, lives a lone and can perform most ADLs, she does not drive or shop. METS<4.  -According to the RCRI patient Class III risk with 10.1% 30 day risk of death, MI or cardiac arrest. However this does not factor in her severe pulmonary hypertension and age, which puts her at high risk.  At the same time, there is risk to not pursuing surgery, as patient is active and would like to maintain her QOL.  I spoke with patient and her family about the risk of surgery and they would like to proceed, recognizing her risk.  Will check echo to evaluate LV/RV function and pulmonary pressures. Recommend regional anesthesia over general anesthesia if possible  For questions or updates, please contact Ashley Please consult [email or web address] for contact info under    Signed, Cadence Arlyss Repress  01/19/2022 7:43 AM   Patient seen and examined.  Agree with above documentation.  [name] is a 88 year old female  with history of persistent atrial fibrillation on Eliquis, severe mitral regurgitation, moderate to severe tricuspid vegetation, severe pulmonary hypertension, COPD, T2DM who we are consulted for preop  cardiac evaluation at the request of Dr. Carles Collet.  Most recent echocardiogram 09/26/2017 showed EF 50 to [id number], normal systolic function, severe left atrial enlargement, mild right atrial enlargement, severe MR, moderate to severe TR.  She presented to the ED after fall.  She fell while doing the dishes, and thinks she lost her balance.  Denies any loss of consciousness.  In the ED, initial VS notable for BP 158/91, SpO2 100% on RA, Pulse 110.  Labs notable for Cr 0.8, Hgb 9.7, trop 17>17.  HCT unremarkable.  Found to have right hip fracture, planning surgery.  EKG/telemketry show AF/AFL, rates 90-130s.  Currently 90-100s.  On exam, patient is alert and oriented, irregular, tachycardic, no murmurs, lungs CTAB, no LE edema.  For her preop evaluation, she denies any chest pain or dyspnea.  Most exertion she does is walking around her house, functional capacity less than 4 METS.  Given her severe pulmonary hypertension and age, she is a high risk surgical candidate.  However patient and family recognize the risk and would like to proceed with surgery, as patient states that her goal is to maintain her mobility and quality of life.  Check echo. Recommend regional anesthesia if able.  Donato Heinz, MD

## 2022-01-19 NOTE — ED Notes (Signed)
Per Ortho PA patient surgery has been scheduled for tomorrow morning.

## 2022-01-19 NOTE — TOC Progression Note (Signed)
  Transition of Care [facility]) Screening Note   Patient Details  Name: [name] Date of Birth: 06-24-1926   [facility] CM/SW Contact:    Leitha Bleak, RN Phone Number: 01/19/2022, 10:39 AM  Transferring to [facility]  Transition of Care Department [facility]) has reviewed patient and no TOC needs have been identified at this time. We will continue to monitor patient advancement through interdisciplinary progression rounds. If new patient transition needs arise, please place a TOC consult.   Expected Discharge Plan: Skilled Nursing Facility    Expected Discharge Plan and Services Expected Discharge Plan: Skilled Nursing Facility

## 2022-01-19 NOTE — Progress Notes (Signed)
Paged to bedside for tachycardia. Patient asymptomatic. BP stable. HR initially 130s and SBP 150s. Pain treated with fentanyl. [facility] requesting to cancel transfer due to HR. Advised that we are not cancelling a transfer that has been waiting for 12 hours, until interventions have been given adequate time to show some response. Cardizem push 10mg  also administered. EKG was showing sinus tach. Follow up EKG shows Afib HR 67. Patient does have a history of Afib. BP remained stable. [facility] plans to observe for 10 more mins prior to transferring patient. Patient remains stable without complaints.

## 2022-01-19 NOTE — Hospital Course (Signed)
88 year old female (very active at baseline still lives alone not driving but cooks 3 square meals a day takes medications out of a pouch has mainly hardness of hearing but no significant dementia) with a history of persistent atrial fibrillation/atrial flutter previously on amiodarone, hypertension, severe mitral regurgitation, moderate to severe tricuspid regurgitation, pulmonary hypertension, left bundle branch block, diabetes mellitus type 2, diet-controlled presenting with mechanical fall while doing dishes in the kitchen.  The patient denied any syncope.  She denied any prodromal symptoms including any dizziness, chest pain, shortness of breath.  She presented to the emergency department with pain on the right side.  X-ray of the right hip showed intertrochanteric right hip fracture. Patient voiced preference for being seen by Dr. Susa Simmonds and for him to perform surgery he was consulted and he recommends transferring the patient over to [facility] will place on a diet until patient can be seen-it is unclear when surgery will be performed

## 2022-01-19 NOTE — ED Notes (Signed)
Family and patient updated on the possibility of patient have hip sx this afternoon and was told by Earney Hamburg, PA to withhold food today due to the possibility of sx. Family and patient verbalized understanding.

## 2022-01-19 NOTE — Progress Notes (Signed)
*  PRELIMINARY RESULTS* Echocardiogram 2D Echocardiogram has been performed.  Stacey Drain 01/19/2022, 12:07 PM

## 2022-01-19 NOTE — Progress Notes (Addendum)
PROGRESS NOTE  [name] PPJ:[id number] DOB: [birth year] DOA: 01/18/2022 PCP: John Giovanni, MD  Brief History:  88 year old female (very active at baseline still lives alone not driving but cooks 3 square meals a day takes medications out of a pouch has mainly hardness of hearing but no significant dementia) with a history of persistent atrial fibrillation/atrial flutter previously on amiodarone, hypertension, severe mitral regurgitation, moderate to severe tricuspid regurgitation, pulmonary hypertension, left bundle branch block, diabetes mellitus type 2, diet-controlled presenting with mechanical fall while doing dishes in the kitchen.  The patient denied any syncope.  She denied any prodromal symptoms including any dizziness, chest pain, shortness of breath.  She presented to the emergency department with pain on the right side.  X-ray of the right hip showed intertrochanteric right hip fracture. Patient voiced preference for being seen by Dr. Susa Simmonds and for him to perform surgery he was consulted and he recommends transferring the patient over to [facility] will place on a diet until patient can be seen-it is unclear when surgery will be performed   Assessment/Plan: Left hip intertrochanteric fracture -Dr. Particia Nearing spoke with Ortho, Dr. Garnette Czech to consult on patient after transfer -Allow for diet right now as unclear when operative intervention will take place -Pain control Norco 1-2 tabs every 6 as needed moderate pain morphine 0.5 every 2 as needed severe pain hold for sedation -follow up 25 vitD  Persistent atrial fibrillation/a flutter with RVR -Patient had not had her oral medications -Restart metoprolol home dose 75 mg twice daily -Holding apixaban -appreciate cardiology consult--discussed with Dr. Bjorn Pippin >>>>postpone surgery 24 hours to re-eval her pulm HTN, consider use of spinal anesthesia if possible; ortho notified  Essential hypertension -Holding  clonidine to allow blood pressure margin for A-fib control -Continue Toprol tartrate  Hypothyroidism -Continue Synthroid  Diet-controlled diabetes mellitus type 2 -11/19/2019 hemoglobin A1c 5.1 -Would not monitor CBGs regularly  Prior dysphagia-status post scope in 2015 Monitor trends postprocedure-previously has been on dysphagia 2 diet   Family Communication:   no Family at bedside  Consultants:  ortho--[place]  Code Status:  FULL  DVT Prophylaxis:  [place] Heparin   Procedures: As Listed in Progress Note Above  Antibiotics: None       Subjective: Patient denies fevers, chills, headache, chest pain, dyspnea, nausea, vomiting, diarrhea, abdominal pain, dysuria, hematuria, hematochezia, and melena.   Objective: Vitals:   01/19/22 0435 01/19/22 0530 01/19/22 0702 01/19/22 0730  BP: 108/67 108/70  106/74  Pulse: (!) 127 (!) 108  (!) 133  Resp: 17 19  (!) 21  Temp:   97.8 F (36.6 C)   TempSrc:   Oral   SpO2: 97% 99%  97%  Weight:      Height:       No intake or output data in the 24 hours ending 01/19/22 0800 Weight change:  Exam:  General:  Pt is alert, follows commands appropriately, not in acute distress HEENT: No icterus, No thrush, No neck mass, [place]/AT Cardiovascular: IRRR, S1/S2, no rubs, no gallops Respiratory: CTA bilaterally, no wheezing, no crackles, no rhonchi Abdomen: Soft/+BS, non tender, non distended, no guarding Extremities: No edema, No lymphangitis, No petechiae, No rashes, no synovitis   Data Reviewed: I have personally reviewed following labs and imaging studies Basic Metabolic Panel: Recent Labs  Lab 01/18/22 1656 01/19/22 0326  NA 136 136  K 4.3 4.4  CL 104 107  CO2 24 22  GLUCOSE 129* 115*  BUN 22 18  CREATININE 0.86 0.84  CALCIUM 9.2 8.4*   Liver Function Tests: No results for input(s): "AST", "ALT", "ALKPHOS", "BILITOT", "PROT", "ALBUMIN" in the last 168 hours. No results for input(s): "LIPASE", "AMYLASE" in the last 168  hours. No results for input(s): "AMMONIA" in the last 168 hours. Coagulation Profile: Recent Labs  Lab 01/18/22 1750  INR 1.4*   CBC: Recent Labs  Lab 01/18/22 1656 01/19/22 0326  WBC 4.8 4.4  NEUTROABS 4.1  --   HGB 10.7* 9.7*  HCT 33.2* 29.9*  MCV 92.0 92.0  PLT 163 137*   Cardiac Enzymes: No results for input(s): "CKTOTAL", "CKMB", "CKMBINDEX", "TROPONINI" in the last 168 hours. BNP: Invalid input(s): "POCBNP" CBG: No results for input(s): "GLUCAP" in the last 168 hours. HbA1C: No results for input(s): "HGBA1C" in the last 72 hours. Urine analysis:    Component Value Date/Time   COLORURINE COLORLESS (A) 03/25/2019 1327   APPEARANCEUR CLEAR 03/25/2019 1327   LABSPEC 1.004 (L) 03/25/2019 1327   PHURINE 7.0 03/25/2019 1327   GLUCOSEU NEGATIVE 03/25/2019 1327   HGBUR NEGATIVE 03/25/2019 1327   BILIRUBINUR NEGATIVE 03/25/2019 1327   KETONESUR 5 (A) 03/25/2019 1327   PROTEINUR NEGATIVE 03/25/2019 1327   UROBILINOGEN 0.2 09/25/2014 1200   NITRITE NEGATIVE 03/25/2019 1327   LEUKOCYTESUR NEGATIVE 03/25/2019 1327   Sepsis Labs: @LABRCNTIP (procalcitonin:4,lacticidven:4) )No results found for this or any previous visit (from the past 240 hour(s)).   Scheduled Meds:  heparin  5,000 Units Subcutaneous Q8H   levothyroxine  25 mcg Oral QAC breakfast   metoprolol tartrate  75 mg Oral BID   pantoprazole  40 mg Oral Daily   Continuous Infusions:  sodium chloride 75 mL/hr at 01/19/22 0459    Procedures/Studies: DG Shoulder Right  Result Date: 01/18/2022 CLINICAL DATA:  Fall EXAM: RIGHT SHOULDER - 2+ VIEW COMPARISON:  None Available. FINDINGS: No fracture or dislocation is seen. The joint spaces are preserved. The visualized soft tissues are unremarkable. Visualized right lung is clear. IMPRESSION: Negative. Electronically Signed   By: Julian Hy M.D.   On: 01/18/2022 17:37   DG Hip Unilat W or Wo Pelvis 2-3 Views Right  Result Date: 01/18/2022 CLINICAL DATA:   Fall, right hip pain EXAM: DG HIP (WITH OR WITHOUT PELVIS) 2-3V RIGHT COMPARISON:  None Available. FINDINGS: Intertrochanteric right hip fracture with mild foreshortening and varus angulation. Bilateral hip joint spaces are preserved. Visualized bony pelvis appears intact. Mild degenerative changes of the lower lumbar spine. IMPRESSION: Intertrochanteric right hip fracture, as above. Electronically Signed   By: Julian Hy M.D.   On: 01/18/2022 17:36   CT Head Wo Contrast  Result Date: 01/18/2022 CLINICAL DATA:  Fall EXAM: CT HEAD WITHOUT CONTRAST CT CERVICAL SPINE WITHOUT CONTRAST TECHNIQUE: Multidetector CT imaging of the head and cervical spine was performed following the standard protocol without intravenous contrast. Multiplanar CT image reconstructions of the cervical spine were also generated. RADIATION DOSE REDUCTION: This exam was performed according to the departmental dose-optimization program which includes automated exposure control, adjustment of the mA and/or kV according to patient size and/or use of iterative reconstruction technique. COMPARISON:  None Available. FINDINGS: CT HEAD FINDINGS Brain: No evidence of acute infarction, hemorrhage, hydrocephalus, extra-axial collection or mass lesion/mass effect. Vascular: Intracranial atherosclerosis. Skull: Normal. Negative for fracture or focal lesion. Sinuses/Orbits: The visualized paranasal sinuses are essentially clear. The mastoid air cells are unopacified. Other: None. CT CERVICAL SPINE FINDINGS Alignment: Normal cervical lordosis. Skull base and vertebrae: No acute fracture. No  primary bone lesion or focal pathologic process. Soft tissues and spinal canal: No prevertebral fluid or swelling. No visible canal hematoma. Disc levels: Intervertebral disc spaces are maintained. Spinal canal is patent. Upper chest: Visualized lung apices are clear. Other: Multiple left thyroid nodules, measuring up to 18 mm, previously evaluated on thyroid  ultrasound. This has been evaluated on previous imaging. (ref: J Am Coll Radiol. 2015 Feb;12(2): 143-50). IMPRESSION: Normal head CT. Normal cervical spine CT. Electronically Signed   By: Charline Bills M.D.   On: 01/18/2022 17:32   CT Cervical Spine Wo Contrast  Result Date: 01/18/2022 CLINICAL DATA:  Fall EXAM: CT HEAD WITHOUT CONTRAST CT CERVICAL SPINE WITHOUT CONTRAST TECHNIQUE: Multidetector CT imaging of the head and cervical spine was performed following the standard protocol without intravenous contrast. Multiplanar CT image reconstructions of the cervical spine were also generated. RADIATION DOSE REDUCTION: This exam was performed according to the departmental dose-optimization program which includes automated exposure control, adjustment of the mA and/or kV according to patient size and/or use of iterative reconstruction technique. COMPARISON:  None Available. FINDINGS: CT HEAD FINDINGS Brain: No evidence of acute infarction, hemorrhage, hydrocephalus, extra-axial collection or mass lesion/mass effect. Vascular: Intracranial atherosclerosis. Skull: Normal. Negative for fracture or focal lesion. Sinuses/Orbits: The visualized paranasal sinuses are essentially clear. The mastoid air cells are unopacified. Other: None. CT CERVICAL SPINE FINDINGS Alignment: Normal cervical lordosis. Skull base and vertebrae: No acute fracture. No primary bone lesion or focal pathologic process. Soft tissues and spinal canal: No prevertebral fluid or swelling. No visible canal hematoma. Disc levels: Intervertebral disc spaces are maintained. Spinal canal is patent. Upper chest: Visualized lung apices are clear. Other: Multiple left thyroid nodules, measuring up to 18 mm, previously evaluated on thyroid ultrasound. This has been evaluated on previous imaging. (ref: J Am Coll Radiol. 2015 Feb;12(2): 143-50). IMPRESSION: Normal head CT. Normal cervical spine CT. Electronically Signed   By: Charline Bills M.D.   On:  01/18/2022 17:32    Catarina Hartshorn, DO  [facility]  If 7PM-7AM, please contact night-coverage [email or web address] Password [email or web address] 01/19/2022, 8:00 AM   LOS: 1 day

## 2022-01-20 ENCOUNTER — Other Ambulatory Visit: Payer: Self-pay

## 2022-01-20 ENCOUNTER — Inpatient Hospital Stay (HOSPITAL_COMMUNITY): Payer: Medicare Other

## 2022-01-20 ENCOUNTER — Encounter (HOSPITAL_COMMUNITY)
Admission: EM | Disposition: A | Discharge status: Skilled Nursing Facility | Payer: Self-pay | Source: Home / Self Care | Attending: Internal Medicine

## 2022-01-20 ENCOUNTER — Encounter (HOSPITAL_COMMUNITY): Payer: Self-pay | Admitting: Family Medicine

## 2022-01-20 ENCOUNTER — Inpatient Hospital Stay (HOSPITAL_COMMUNITY): Payer: Medicare Other | Admitting: Certified Registered"

## 2022-01-20 DIAGNOSIS — I4892 Unspecified atrial flutter: Secondary | ICD-10-CM

## 2022-01-20 DIAGNOSIS — I5032 Chronic diastolic (congestive) heart failure: Secondary | ICD-10-CM

## 2022-01-20 DIAGNOSIS — I429 Cardiomyopathy, unspecified: Secondary | ICD-10-CM

## 2022-01-20 DIAGNOSIS — Z87891 Personal history of nicotine dependence: Secondary | ICD-10-CM | POA: Diagnosis not present

## 2022-01-20 DIAGNOSIS — I272 Pulmonary hypertension, unspecified: Secondary | ICD-10-CM

## 2022-01-20 DIAGNOSIS — S72001A Fracture of unspecified part of neck of right femur, initial encounter for closed fracture: Secondary | ICD-10-CM | POA: Diagnosis not present

## 2022-01-20 DIAGNOSIS — I110 Hypertensive heart disease with heart failure: Secondary | ICD-10-CM

## 2022-01-20 DIAGNOSIS — S72141A Displaced intertrochanteric fracture of right femur, initial encounter for closed fracture: Secondary | ICD-10-CM

## 2022-01-20 HISTORY — PX: INTRAMEDULLARY (IM) NAIL INTERTROCHANTERIC: SHX5875

## 2022-01-20 LAB — CBC
HCT: 30 % — ABNORMAL LOW (ref 36.0–46.0)
Hemoglobin: 9.7 g/dL — ABNORMAL LOW (ref 12.0–15.0)
MCH: 29.8 pg (ref 26.0–34.0)
MCHC: 32.3 g/dL (ref 30.0–36.0)
MCV: 92 fL (ref 80.0–100.0)
Platelets: 114 10*3/uL — ABNORMAL LOW (ref 150–400)
RBC: 3.26 MIL/uL — ABNORMAL LOW (ref 3.87–5.11)
RDW: 14.2 % (ref 11.5–15.5)
WBC: 6 10*3/uL (ref 4.0–10.5)
nRBC: 0 % (ref 0.0–0.2)

## 2022-01-20 LAB — BASIC METABOLIC PANEL
Anion gap: 7 (ref 5–15)
BUN: 14 mg/dL (ref 8–23)
CO2: 21 mmol/L — ABNORMAL LOW (ref 22–32)
Calcium: 8.1 mg/dL — ABNORMAL LOW (ref 8.9–10.3)
Chloride: 109 mmol/L (ref 98–111)
Creatinine, Ser: 0.86 mg/dL (ref 0.44–1.00)
GFR, Estimated: >60 mL/min (ref >60)
Glucose, Bld: 108 mg/dL — ABNORMAL HIGH (ref 70–99)
Potassium: 3.7 mmol/L (ref 3.5–5.1)
Sodium: 137 mmol/L (ref 135–145)

## 2022-01-20 LAB — SURGICAL PCR SCREEN
MRSA, PCR: NEGATIVE
Staphylococcus aureus: NEGATIVE

## 2022-01-20 SURGERY — FIXATION, FRACTURE, INTERTROCHANTERIC, WITH INTRAMEDULLARY ROD
Anesthesia: General | Site: Hip | Laterality: Right

## 2022-01-20 MED ORDER — METHOCARBAMOL 500 MG PO TABS
500.0000 mg | ORAL_TABLET | Freq: Four times a day (QID) | ORAL | Status: DC | PRN
  Administered 2022-01-24: 500 mg via ORAL
  Filled 2022-01-20: qty 1

## 2022-01-20 MED ORDER — ACETAMINOPHEN 10 MG/ML IV SOLN
700.0000 mg | Freq: Once | INTRAVENOUS | Status: DC | PRN
Start: 2022-01-20 — End: 2022-01-20

## 2022-01-20 MED ORDER — ONDANSETRON HCL 4 MG PO TABS: 4.0000 mg | ORAL_TABLET | Freq: Four times a day (QID) | ORAL | Status: DC | PRN

## 2022-01-20 MED ORDER — DEXAMETHASONE SODIUM PHOSPHATE 10 MG/ML IJ SOLN
INTRAMUSCULAR | Status: DC | PRN
  Administered 2022-01-20: 4 mg via INTRAVENOUS

## 2022-01-20 MED ORDER — EPHEDRINE 5 MG/ML INJ
INTRAVENOUS | Status: AC
  Filled 2022-01-20: qty 5

## 2022-01-20 MED ORDER — ONDANSETRON HCL 4 MG/2ML IJ SOLN: 4.0000 mg | Freq: Four times a day (QID) | INTRAMUSCULAR | Status: DC | PRN

## 2022-01-20 MED ORDER — ORAL CARE MOUTH RINSE: 15.0000 mL | Freq: Once | OROMUCOSAL | Status: AC

## 2022-01-20 MED ORDER — 0.9 % SODIUM CHLORIDE (POUR BTL) OPTIME
TOPICAL | Status: DC | PRN
  Administered 2022-01-20: 1000 mL

## 2022-01-20 MED ORDER — CEFAZOLIN SODIUM 1 G IJ SOLR
INTRAMUSCULAR | Status: AC
  Filled 2022-01-20: qty 10

## 2022-01-20 MED ORDER — FENTANYL CITRATE (PF) 250 MCG/5ML IJ SOLN
INTRAMUSCULAR | Status: AC
  Filled 2022-01-20: qty 5

## 2022-01-20 MED ORDER — METOCLOPRAMIDE HCL 5 MG/ML IJ SOLN: 5.0000 mg | Freq: Three times a day (TID) | INTRAMUSCULAR | Status: DC | PRN

## 2022-01-20 MED ORDER — LIDOCAINE 2% (20 MG/ML) 5 ML SYRINGE
INTRAMUSCULAR | Status: DC | PRN
  Administered 2022-01-20: 60 mg via INTRAVENOUS

## 2022-01-20 MED ORDER — LIDOCAINE 2% (20 MG/ML) 5 ML SYRINGE
INTRAMUSCULAR | Status: AC
  Filled 2022-01-20: qty 10

## 2022-01-20 MED ORDER — ROCURONIUM BROMIDE 10 MG/ML (PF) SYRINGE
PREFILLED_SYRINGE | INTRAVENOUS | Status: DC | PRN
  Administered 2022-01-20: 30 mg via INTRAVENOUS

## 2022-01-20 MED ORDER — ENSURE ENLIVE PO LIQD
237.0000 mL | Freq: Two times a day (BID) | ORAL | Status: DC
  Administered 2022-01-20 – 2022-01-26 (×11): 237 mL via ORAL
  Filled 2022-01-20: qty 237

## 2022-01-20 MED ORDER — FENTANYL CITRATE (PF) 250 MCG/5ML IJ SOLN
INTRAMUSCULAR | Status: DC | PRN
Start: 2022-01-20 — End: 2022-01-20
  Administered 2022-01-20: 100 ug via INTRAVENOUS

## 2022-01-20 MED ORDER — AMIODARONE HCL IN DEXTROSE 360-4.14 MG/200ML-% IV SOLN
60.0000 mg/h | INTRAVENOUS | Status: DC
  Administered 2022-01-20 (×2): 60 mg/h via INTRAVENOUS
  Filled 2022-01-20 (×2): qty 200

## 2022-01-20 MED ORDER — AMISULPRIDE (ANTIEMETIC) 5 MG/2ML IV SOLN
10.0000 mg | Freq: Once | INTRAVENOUS | Status: DC | PRN
Start: 2022-01-20 — End: 2022-01-20

## 2022-01-20 MED ORDER — ESMOLOL HCL 100 MG/10ML IV SOLN
INTRAVENOUS | Status: AC
  Filled 2022-01-20: qty 10

## 2022-01-20 MED ORDER — METHOCARBAMOL 1000 MG/10ML IJ SOLN: 500.0000 mg | Freq: Four times a day (QID) | INTRAVENOUS | Status: DC | PRN

## 2022-01-20 MED ORDER — METOPROLOL TARTRATE 5 MG/5ML IV SOLN
2.5000 mg | Freq: Once | INTRAVENOUS | Status: AC
  Administered 2022-01-20: 2.5 mg via INTRAVENOUS
  Filled 2022-01-20: qty 5

## 2022-01-20 MED ORDER — PHENYLEPHRINE HCL-NACL 20-0.9 MG/250ML-% IV SOLN
INTRAVENOUS | Status: DC | PRN
  Administered 2022-01-20: 25 ug/min via INTRAVENOUS

## 2022-01-20 MED ORDER — FENTANYL CITRATE (PF) 100 MCG/2ML IJ SOLN: 25.0000 ug | INTRAMUSCULAR | Status: DC | PRN

## 2022-01-20 MED ORDER — DOCUSATE SODIUM 50 MG/5ML PO LIQD
100.0000 mg | Freq: Two times a day (BID) | ORAL | Status: DC
  Administered 2022-01-20 – 2022-01-22 (×4): 100 mg via ORAL
  Filled 2022-01-20 (×4): qty 10

## 2022-01-20 MED ORDER — PROPOFOL 10 MG/ML IV BOLUS
INTRAVENOUS | Status: AC
  Filled 2022-01-20: qty 20

## 2022-01-20 MED ORDER — ROCURONIUM BROMIDE 10 MG/ML (PF) SYRINGE
PREFILLED_SYRINGE | INTRAVENOUS | Status: AC
  Filled 2022-01-20: qty 20

## 2022-01-20 MED ORDER — ONDANSETRON HCL 4 MG/2ML IJ SOLN
INTRAMUSCULAR | Status: DC | PRN
  Administered 2022-01-20: 4 mg via INTRAVENOUS

## 2022-01-20 MED ORDER — AMIODARONE LOAD VIA INFUSION
150.0000 mg | Freq: Once | INTRAVENOUS | Status: AC
  Administered 2022-01-20: 150 mg via INTRAVENOUS
  Filled 2022-01-20: qty 83.34

## 2022-01-20 MED ORDER — DOCUSATE SODIUM 100 MG PO CAPS
100.0000 mg | ORAL_CAPSULE | Freq: Two times a day (BID) | ORAL | Status: DC
Start: 2022-01-20 — End: 2022-01-20
  Filled 2022-01-20: qty 1

## 2022-01-20 MED ORDER — SUGAMMADEX SODIUM 200 MG/2ML IV SOLN
INTRAVENOUS | Status: DC | PRN
  Administered 2022-01-20: 100 mg via INTRAVENOUS

## 2022-01-20 MED ORDER — CHLORHEXIDINE GLUCONATE 0.12 % MT SOLN: 15.0000 mL | Freq: Once | OROMUCOSAL | Status: AC

## 2022-01-20 MED ORDER — POLYETHYLENE GLYCOL 3350 17 G PO PACK: 17.0000 g | PACK | Freq: Every day | ORAL | Status: DC | PRN

## 2022-01-20 MED ORDER — PHENYLEPHRINE 80 MCG/ML (10ML) SYRINGE FOR IV PUSH (FOR BLOOD PRESSURE SUPPORT)
PREFILLED_SYRINGE | INTRAVENOUS | Status: AC
  Filled 2022-01-20: qty 30

## 2022-01-20 MED ORDER — CEFAZOLIN SODIUM-DEXTROSE 2-4 GM/100ML-% IV SOLN
2.0000 g | Freq: Three times a day (TID) | INTRAVENOUS | Status: AC
  Administered 2022-01-20 – 2022-01-21 (×3): 2 g via INTRAVENOUS
  Filled 2022-01-20 (×3): qty 100

## 2022-01-20 MED ORDER — AMIODARONE HCL IN DEXTROSE 360-4.14 MG/200ML-% IV SOLN
30.0000 mg/h | INTRAVENOUS | Status: DC
  Administered 2022-01-20: 60 mg/h via INTRAVENOUS
  Administered 2022-01-20 – 2022-01-21 (×3): 30 mg/h via INTRAVENOUS
  Filled 2022-01-20 (×3): qty 200

## 2022-01-20 MED ORDER — DEXAMETHASONE SODIUM PHOSPHATE 10 MG/ML IJ SOLN
INTRAMUSCULAR | Status: AC
  Filled 2022-01-20: qty 1

## 2022-01-20 MED ORDER — TRANEXAMIC ACID-NACL 1000-0.7 MG/100ML-% IV SOLN
1000.0000 mg | Freq: Once | INTRAVENOUS | Status: AC
  Administered 2022-01-20: 1000 mg via INTRAVENOUS
  Filled 2022-01-20: qty 100

## 2022-01-20 MED ORDER — ONDANSETRON HCL 4 MG/2ML IJ SOLN: 4.0000 mg | Freq: Once | INTRAMUSCULAR | Status: DC | PRN

## 2022-01-20 MED ORDER — ADULT MULTIVITAMIN W/MINERALS CH
1.0000 | ORAL_TABLET | Freq: Every day | ORAL | Status: DC
  Administered 2022-01-20 – 2022-01-26 (×7): 1 via ORAL
  Filled 2022-01-20 (×8): qty 1

## 2022-01-20 MED ORDER — LACTATED RINGERS IV SOLN: INTRAVENOUS | Status: DC

## 2022-01-20 MED ORDER — METOCLOPRAMIDE HCL 5 MG PO TABS: 5.0000 mg | ORAL_TABLET | Freq: Three times a day (TID) | ORAL | Status: DC | PRN

## 2022-01-20 MED ORDER — CHLORHEXIDINE GLUCONATE 0.12 % MT SOLN
OROMUCOSAL | Status: AC
  Administered 2022-01-20: 15 mL via OROMUCOSAL
  Filled 2022-01-20: qty 15

## 2022-01-20 MED ORDER — ONDANSETRON HCL 4 MG/2ML IJ SOLN
INTRAMUSCULAR | Status: AC
  Filled 2022-01-20: qty 6

## 2022-01-20 MED ORDER — PROPOFOL 10 MG/ML IV BOLUS
INTRAVENOUS | Status: DC | PRN
  Administered 2022-01-20: 10 mg via INTRAVENOUS
  Administered 2022-01-20: 20 mg via INTRAVENOUS

## 2022-01-20 MED ORDER — PHENYLEPHRINE 80 MCG/ML (10ML) SYRINGE FOR IV PUSH (FOR BLOOD PRESSURE SUPPORT)
PREFILLED_SYRINGE | INTRAVENOUS | Status: DC | PRN
  Administered 2022-01-20 (×2): 80 ug via INTRAVENOUS

## 2022-01-20 MED ORDER — POVIDONE-IODINE 10 % EX SWAB
2.0000 | Freq: Once | CUTANEOUS | Status: AC
  Administered 2022-01-20: 2 via TOPICAL

## 2022-01-20 SURGICAL SUPPLY — 46 items
BAG COUNTER SPONGE SURGICOUNT (BAG) IMPLANT
BIT DRILL INTERTAN LAG SCREW (BIT) IMPLANT
BIT DRILL LONG 4.0 (BIT) IMPLANT
BRUSH SCRUB EZ PLAIN DRY (MISCELLANEOUS) ×2 IMPLANT
CHLORAPREP W/TINT 26 (MISCELLANEOUS) ×1 IMPLANT
COVER PERINEAL POST (MISCELLANEOUS) ×1 IMPLANT
COVER SURGICAL LIGHT HANDLE (MISCELLANEOUS) ×1 IMPLANT
DERMABOND ADVANCED (GAUZE/BANDAGES/DRESSINGS) ×1
DERMABOND ADVANCED .7 DNX12 (GAUZE/BANDAGES/DRESSINGS) IMPLANT
DRAPE C-ARM 35X43 STRL (DRAPES) ×1 IMPLANT
DRAPE IMP U-DRAPE 54X76 (DRAPES) ×2 IMPLANT
DRAPE INCISE IOBAN 66X45 STRL (DRAPES) ×1 IMPLANT
DRAPE STERI IOBAN 125X83 (DRAPES) ×1 IMPLANT
DRAPE SURG 17X23 STRL (DRAPES) ×2 IMPLANT
DRAPE U-SHAPE 47X51 STRL (DRAPES) ×1 IMPLANT
DRESSING MEPILEX FLEX 4X4 (GAUZE/BANDAGES/DRESSINGS) IMPLANT
DRILL BIT LONG 4.0 (BIT) ×1
DRSG MEPILEX FLEX 4X4 (GAUZE/BANDAGES/DRESSINGS) ×1
DRSG MEPILEX POST OP 4X8 (GAUZE/BANDAGES/DRESSINGS) IMPLANT
ELECT REM PT RETURN 9FT ADLT (ELECTROSURGICAL) ×1
ELECTRODE REM PT RTRN 9FT ADLT (ELECTROSURGICAL) ×1 IMPLANT
GLOVE BIO SURGEON STRL SZ 6.5 (GLOVE) ×3 IMPLANT
GLOVE BIO SURGEON STRL SZ7.5 (GLOVE) ×4 IMPLANT
GLOVE BIOGEL PI IND STRL 6.5 (GLOVE) ×1 IMPLANT
GLOVE BIOGEL PI IND STRL 7.5 (GLOVE) ×1 IMPLANT
GLOVE BIOGEL PI INDICATOR 6.5 (GLOVE) ×1
GLOVE BIOGEL PI INDICATOR 7.5 (GLOVE) ×1
GOWN STRL REUS W/ TWL LRG LVL3 (GOWN DISPOSABLE) ×1 IMPLANT
GOWN STRL REUS W/TWL LRG LVL3 (GOWN DISPOSABLE) ×1
GUIDE PIN 3.2X343 (PIN) ×2
GUIDE PIN 3.2X343MM (PIN) ×2
KIT BASIN OR (CUSTOM PROCEDURE TRAY) ×1 IMPLANT
KIT TURNOVER KIT B (KITS) ×1 IMPLANT
MANIFOLD NEPTUNE II (INSTRUMENTS) ×1 IMPLANT
NAIL INTERTAN 10X18 130D 10S (Nail) IMPLANT
NS IRRIG 1000ML POUR BTL (IV SOLUTION) ×1 IMPLANT
PACK GENERAL/GYN (CUSTOM PROCEDURE TRAY) ×1 IMPLANT
PAD ARMBOARD 7.5X6 YLW CONV (MISCELLANEOUS) ×2 IMPLANT
PIN GUIDE 3.2X343MM (PIN) IMPLANT
SCREW LAG COMPR KIT 100/95 (Screw) IMPLANT
SCREW TRIGEN LOW PROF 5.0X35 (Screw) IMPLANT
SUT MNCRL AB 3-0 PS2 18 (SUTURE) IMPLANT
SUT MON AB 2-0 CT1 36 (SUTURE) IMPLANT
SUT VIC AB 2-0 CT1 27 (SUTURE) ×1
SUT VIC AB 2-0 CT1 TAPERPNT 27 (SUTURE) IMPLANT
TOWEL GREEN STERILE (TOWEL DISPOSABLE) ×2 IMPLANT

## 2022-01-20 NOTE — Anesthesia Preprocedure Evaluation (Addendum)
Anesthesia Evaluation  Patient identified by MRN, date of birth, ID band Patient awake    Reviewed: Allergy & Precautions, NPO status , Patient's Chart, lab work & pertinent test results  Airway Mallampati: II  TM Distance: >3 FB Neck ROM: Full    Dental  (+) Edentulous Upper, Edentulous Lower   Pulmonary COPD,  COPD inhaler, former smoker,    Pulmonary exam normal        Cardiovascular hypertension, Pt. on medications and Pt. on home beta blockers pulmonary hypertension+CHF  Normal cardiovascular exam+ dysrhythmias Atrial Fibrillation + Valvular Problems/Murmurs MR   ECHO: Left ventricular  ejection fraction, by estimation, is 35 to 40%. The left ventricle has moderately decreased function   Neuro/Psych PSYCHIATRIC DISORDERS Anxiety Depression negative neurological ROS     GI/Hepatic Neg liver ROS, PUD, GERD  Medicated and Controlled,  Endo/Other  diabetesHypothyroidism   Renal/GU negative Renal ROS     Musculoskeletal negative musculoskeletal ROS (+)   Abdominal   Peds  Hematology  (+) Blood dyscrasia, anemia ,   Anesthesia Other Findings right intertrochanteric fracture  Reproductive/Obstetrics                           Anesthesia Physical Anesthesia Plan  ASA: 4  Anesthesia Plan: General   Post-op Pain Management:    Induction: Intravenous  PONV Risk Score and Plan: 3 and Ondansetron, Dexamethasone and Treatment may vary due to age or medical condition  Airway Management Planned: Oral ETT  Additional Equipment:   Intra-op Plan:   Post-operative Plan: Extubation in OR  Informed Consent: I have reviewed the patients History and Physical, chart, labs and discussed the procedure including the risks, benefits and alternatives for the proposed anesthesia with the patient or authorized representative who has indicated his/her understanding and acceptance.   Patient has DNR.   Discussed DNR with patient and Continue DNR.   Dental advisory given and Consent reviewed with POA  Plan Discussed with: CRNA  Anesthesia Plan Comments: (Amiodarone drip Last dose of Eliquis on Monday 01/18/22 at 1100am Family requests no chest compressions)        Anesthesia Quick Evaluation

## 2022-01-20 NOTE — Progress Notes (Signed)
PROGRESS NOTE    [name]  ZOX:[id number] DOB: December 10, 1926 DOA: 01/18/2022 PCP: John Giovanni, MD     Brief Narrative:  88 year old female (very active at baseline still lives alone not driving but cooks 3 square meals a day takes medications out of a pouch has mainly hardness of hearing but no significant dementia) with a history of persistent atrial fibrillation/atrial flutter previously on amiodarone, hypertension, severe mitral regurgitation, moderate to severe tricuspid regurgitation, pulmonary hypertension, left bundle branch block, diabetes mellitus type 2, diet-controlled presenting with mechanical fall while doing dishes in the kitchen.  The patient denied any syncope.  She denied any prodromal symptoms including any dizziness, chest pain, shortness of breath.  She presented to the emergency department with pain on the right side.  X-ray of the right hip showed intertrochanteric right hip fracture. Patient voiced preference for being seen by Dr. Susa Simmonds and for him to perform surgery he was consulted and he recommends transferring the patient over to [facility] will place on a diet until patient can be seen-it is unclear when surgery will be performed   Subjective:  She is seen after returned from OR,  she is very pleasant, aaox3 Family at bedside   Assessment & Plan:  Principal Problem:   A-fib [facility]) Active Problems:   Dysphagia, pharyngoesophageal phase   Diabetes (HCC)   Atrial flutter with rapid ventricular response (HCC)   GERD (gastroesophageal reflux disease)   Dysphagia, oropharyngeal   Closed fracture of right hip (HCC)    Assessment and Plan: No notes have been filed under this hospital service. Service: Hospitalist    Fall his left hip intertrochanteric fracture -IMN planned -management per ortho  A-fib RVR -Was taken off amiodarone long-term due to thyroid side effect -Case discussed with cardiology, plan to use amiodarone drip  perioperatively for heart rate control -requested formal cardiology consult  Systolic CHF Appear euvolemic to dry on exam    Underweight The patient's BMI is: Body mass index is 16.8 kg/m.Marland Kitchen   Goals of care discussion Discussed with daughter, risk of CPR intubation overweigh the benefit in a frail underweight 39 year old, daughter in agreement to change code status to DNR        I have Reviewed nursing notes, Vitals, pain scores, I/o's, Lab results and  imaging results since pt's last encounter, details please see discussion above  I ordered the following labs:  Unresulted Labs (From admission, onward)     Start     Ordered   01/21/22 0500  CBC  Daily at 5am,   R     Question:  Specimen collection method  Answer:  Lab=Lab collect   01/20/22 1736   01/21/22 0500  Basic metabolic panel  Tomorrow morning,   R       Question:  Specimen collection method  Answer:  Lab=Lab collect   01/20/22 1736   01/21/22 0500  Magnesium  Tomorrow morning,   R       Question:  Specimen collection method  Answer:  Lab=Lab collect   01/20/22 1939             DVT prophylaxis: SCDs Start: 01/20/22 1737   Code Status:   Code Status: DNR  Family Communication: Family at bedside Disposition:    Dispo: The patient is from: home, lives alone               Anticipated d/c is to: likely will need SNF as family reports no 24/7 care at home  Anticipated d/c date is: >24hrs  Antimicrobials:    Anti-infectives (From admission, onward)    Start     Dose/Rate Route Frequency Ordered Stop   01/20/22 2200  ceFAZolin (ANCEF) IVPB 2g/100 mL premix        2 g 200 mL/hr over 30 Minutes Intravenous Every 8 hours 01/20/22 1736 01/21/22 2159   01/20/22 0600  ceFAZolin (ANCEF) IVPB 2g/100 mL premix        2 g 200 mL/hr over 30 Minutes Intravenous To Short Stay 01/19/22 2244 01/20/22 1458   01/19/22 0945  ceFAZolin (ANCEF) IVPB 2g/100 mL premix  Status:  Discontinued        2 g 200  mL/hr over 30 Minutes Intravenous On call to O.R. 01/19/22 0936 01/20/22 0559          Objective: Vitals:   01/20/22 1615 01/20/22 1630 01/20/22 1645 01/20/22 1701  BP: 134/66 (!) 137/53 (!) 135/54 (!) 140/75  Pulse: 62 (!) 59 60 70  Resp: 20 13 13    Temp:  98.3 F (36.8 C)  (!) 97.5 F (36.4 C)  TempSrc:    Oral  SpO2: 100% 100% 100% 100%  Weight:      Height:        Intake/Output Summary (Last 24 hours) at 01/20/2022 1939 Last data filed at 01/20/2022 1700 Gross per 24 hour  Intake 706.25 ml  Output 75 ml  Net 631.25 ml   Filed Weights   01/18/22 1608 01/20/22 1333  Weight: 45.8 kg 45.8 kg    Examination:  General exam: aaox3, very pleasant Respiratory system: Clear to auscultation. Respiratory effort normal. Cardiovascular system:  IRRR.  Gastrointestinal system: Abdomen is nondistended, soft and nontender.  Normal bowel sounds heard. Central nervous system: Alert and oriented. No focal neurological deficits. Extremities: right hip post op changes, neurovascular intact distally Skin: No rashes, lesions or ulcers Psychiatry: Judgement and insight appear normal. Mood & affect appropriate.     Data Reviewed: I have personally reviewed  labs and visualized  imaging studies since the last encounter and formulate the plan        Scheduled Meds:  docusate sodium  100 mg Oral BID   feeding supplement  237 mL Oral BID BM   levothyroxine  25 mcg Oral QAC breakfast   metoprolol tartrate  75 mg Oral BID   multivitamin with minerals  1 tablet Oral Daily   pantoprazole  40 mg Oral Daily   Continuous Infusions:  sodium chloride 75 mL/hr at 01/20/22 1615   amiodarone 30 mg/hr (01/20/22 1558)    ceFAZolin (ANCEF) IV     methocarbamol (ROBAXIN) IV       LOS: 2 days     01/22/22, MD PhD FACP [facility]  Available via Epic secure chat 7am-7pm for nonurgent issues Please page for urgent issues To page the attending provider between 7A-7P or the  covering provider during after hours 7P-7A, please log into the web site [email or web address] and access using universal [place] password for that web site. If you do not have the password, please call the hospital operator.    01/20/2022, 7:39 PM

## 2022-01-20 NOTE — Interval H&P Note (Signed)
History and Physical Interval Note:  01/20/2022 1:10 PM  [name]  has presented today for surgery, with the diagnosis of right intertrochanteric fracture.  The various methods of treatment have been discussed with the patient and family. After consideration of risks, benefits and other options for treatment, the patient has consented to  Procedure(s): INTRAMEDULLARY (IM) NAIL INTERTROCHANTERIC (Right) as a surgical intervention.  The patient's history has been reviewed, patient examined, no change in status, stable for surgery.  I have reviewed the patient's chart and labs.  Questions were answered to the patient's satisfaction.     Caryn Bee P Keyshla Tunison

## 2022-01-20 NOTE — Progress Notes (Signed)
   01/20/22 0630  Assess: MEWS Score  BP (!) 88/76  MAP (mmHg) 82  Pulse Rate (!) 182  ECG Heart Rate (!) 149  Resp 20  Level of Consciousness Alert  SpO2 92 %  Assess: MEWS Score  MEWS Temp 0  MEWS Systolic 1  MEWS Pulse 3  MEWS RR 0  MEWS LOC 0  MEWS Score 4  MEWS Score Color Red  Assess: if the MEWS score is Yellow or Red  Were vital signs taken at a resting state? Yes  Focused Assessment Change from prior assessment (see assessment flowsheet)  Does the patient meet 2 or more of the SIRS criteria? No  MEWS guidelines implemented *See Row Information* Yes  Treat  Pain Scale 0-10  Pain Score 0  Take Vital Signs  Increase Vital Sign Frequency  Red: Q 1hr X 4 then Q 4hr X 4, if remains red, continue Q 4hrs  Escalate  MEWS: Escalate Red: discuss with charge nurse/RN and provider, consider discussing with RRT  Notify: Charge Nurse/RN  Name of Charge Nurse/RN Notified Alona Bene  Date Charge Nurse/RN Notified 01/20/22  Time Charge Nurse/RN Notified 0700  Notify: Provider  Provider Name/Title DR Martyn Malay  Date Provider Notified 01/20/22  Time Provider Notified [phone]  Document  Progress note created (see row info) Yes  Assess: SIRS CRITERIA  SIRS Temperature  0  SIRS Pulse 1  SIRS Respirations  0  SIRS WBC 1  SIRS Score Sum  2

## 2022-01-20 NOTE — Anesthesia Procedure Notes (Signed)
Procedure Name: Intubation Date/Time: 01/20/2022 2:37 PM  Performed by: Amadeo Garnet, CRNAPre-anesthesia Checklist: Patient identified, Emergency Drugs available, Suction available and Patient being monitored Patient Re-evaluated:Patient Re-evaluated prior to induction Oxygen Delivery Method: Circle system utilized Preoxygenation: Pre-oxygenation with 100% oxygen Induction Type: IV induction Ventilation: Mask ventilation without difficulty Laryngoscope Size: Mac and 4 Grade View: Grade I Tube type: Oral Tube size: 7.0 mm Number of attempts: 1 Airway Equipment and Method: Stylet and Oral airway Placement Confirmation: ETT inserted through vocal cords under direct vision, positive ETCO2 and breath sounds checked- equal and bilateral Secured at: 21 cm Tube secured with: Tape Dental Injury: Teeth and Oropharynx as per pre-operative assessment

## 2022-01-20 NOTE — Progress Notes (Signed)
Dr. Martyn Malay [facility] hospital[facility] notified of HR 130-159 and low BP-88/76

## 2022-01-20 NOTE — Op Note (Signed)
Orthopaedic Surgery Operative Note (CSN: [id number] ) Date of Surgery: 01/20/2022  Admit Date: 01/18/2022   Diagnoses: Pre-Op Diagnoses: Right intertrochanteric femur fracture  Post-Op Diagnosis: Same  Procedures: CPT 27245-Cephalomedullary nailing of right intertrochanteric femur fracture  Surgeons : Primary: Shona Needles, MD  Assistant: Patrecia Pace, PA-C  Location: OR 3   Anesthesia:General   Antibiotics: Ancef 2g preop   Tourniquet time:None   Estimated Blood MOLM:78 mL  Complications:None  Specimens:None   Implants: Implant Name Type Inv. Item Serial No. Manufacturer Lot No. LRB No. Used Action  NAIL INTERTAN 10X18 130D 10S - [id number] Nail NAIL INTERTAN 10X18 130D 10S  [employer] [id number] Right 1 Implanted  SCREW LAG COMPR KIT 100/95 - [id number] Screw SCREW LAG COMPR KIT 100/95  [employer] [id number] Right 1 Implanted  SCREW TRIGEN LOW PROF 5.0X35 - [id number] Screw SCREW TRIGEN LOW PROF 5.0X35  [employer] [id number] Right 1 Implanted     Indications for Surgery: 88 year old female who sustained a ground-level fall and a right intertrochanteric femur fracture.  Due to the unstable nature of her injury I recommend proceeding with cephalomedullary nailing.  Risks and benefits were discussed with the patient and her daughter.  Risks include but not limited to bleeding, infection, malunion, nonunion, hardware failure, hardware irritation, nerve or blood vessel injury, DVT, even the possibility anesthetic complications.  They agreed to proceed with surgery and consent was obtained.  Operative Findings: Cephalomedullary nailing of right intertrochanteric femur fracture using [employer] InterTAN 10 x 180 mm nail with a 100 mm lag screw with a 95 mm compression screw  Procedure: The patient was identified in the preoperative holding area. Consent was confirmed with the patient and their family and all questions  were answered. The operative extremity was marked after confirmation with the patient. she was then brought back to the operating room by our anesthesia colleagues.  She was placed under general anesthetic and carefully transferred over to a Hana table.  All bony prominences were well-padded.  Fluoroscopic imaging showed the unstable nature of her injury.  Traction was applied to the right lower extremity.  Reduction was obtained.  The right lower extremity was then prepped and draped in usual sterile fashion.  A timeout was performed to verify the patient, the procedure, and the extremity.  Preoperative antibiotics were dosed.  Small incision proximal to the greater trochanter was made carried down to the skin and subcutaneous tissue.  A threaded guidewire was appropriately positioned with the appropriate starting point.  It was advanced into the proximal metaphysis.  I confirmed positioning with fluoroscopy.  I then used an entry reamer to enter the medullary canal.  I then passed a 10 x 180 mm nail down the center of the canal and using the targeting arm I made a lateral incision and directed a threaded guidewire into the head/neck segment.  I confirmed adequate tip apex distance.  I then measured the length and chose to use 100 mm lag screw.  The path for the compression screw was then drilled and an antirotation bar was placed.  I then drilled the path for the lag screw.  The lag screw was placed and the compression screw was placed as well.  Approximately 5 mm compression was obtained.  I then statically locked the nail proximally.  I then used the targeting arm to place a lateral to medial distal interlocking screw.  The targeting arm was removed.  Final fluoroscopic imaging was  obtained.  The incision was copiously irrigated.  A layered closure of 2-0 Vicryl 3-0 Monocryl and Dermabond was used to close the skin.  Sterile dressings were applied.  The patient was then awoken from anesthesia and taken to  the PACU in stable condition.  Post Op Plan/Instructions: Patient be weightbearing as tolerated to the right lower extremity.  She will receive postoperative Ancef.  She will be restarted on her Eliquis postoperatively.  We will have her mobilize with physical and Occupational Therapy.  I was present and performed the entire surgery.  Patrecia Pace, PA-C did assist me throughout the case. An assistant was necessary given the difficulty in approach, maintenance of reduction and ability to instrument the fracture.   Katha Hamming, MD [facility]

## 2022-01-20 NOTE — Plan of Care (Addendum)
@  0730 patient heart rate sustaining in the 150s. New orders placed. One dose IV lopressor 2.5 mg given along with bolus amiodarone and then started on a continuous amiodarone drip at 33.64ml/hr. Patient heart rate sustaining in the 60 to 80 range. Spoke with Hollice Gong about rate and stated to notified her if HR went below 40. Will continue to monitor patient.    Problem: Education: Goal: Knowledge of General Education information will improve Description: Including pain rating scale, medication(s)/side effects and non-pharmacologic comfort measures 01/20/2022 1140 by Darleene Cleaver, RN Outcome: Progressing 01/20/2022 1140 by Darleene Cleaver, RN Outcome: Progressing   Problem: Activity: Goal: Risk for activity intolerance will decrease 01/20/2022 1140 by Darleene Cleaver, RN Outcome: Progressing 01/20/2022 1140 by Darleene Cleaver, RN Outcome: Progressing   Problem: Pain Managment: Goal: General experience of comfort will improve 01/20/2022 1140 by Darleene Cleaver, RN Outcome: Progressing 01/20/2022 1140 by Darleene Cleaver, RN Outcome: Progressing   Problem: Safety: Goal: Ability to remain free from injury will improve 01/20/2022 1140 by Darleene Cleaver, RN Outcome: Progressing 01/20/2022 1140 by Darleene Cleaver, RN Outcome: Progressing   Problem: Skin Integrity: Goal: Risk for impaired skin integrity will decrease 01/20/2022 1140 by Darleene Cleaver, RN Outcome: Progressing 01/20/2022 1140 by Darleene Cleaver, RN Outcome: Progressing

## 2022-01-20 NOTE — Progress Notes (Signed)
Rounding Note    Patient Name: [name] Date of Encounter: 01/20/2022  [facility] Cardiologist: Nona Dell, MD   Subjective   Family at bedside as well. Denies chest pain, shortness of breath. Cannot feel that her afib is going fast. Just seen by orthopedics, planning for surgery later today.  Inpatient Medications    Scheduled Meds:  chlorhexidine  60 mL Topical Once   heparin  5,000 Units Subcutaneous Q8H   levothyroxine  25 mcg Oral QAC breakfast   metoprolol tartrate  75 mg Oral BID   pantoprazole  40 mg Oral Daily   povidone-iodine  2 Application Topical Once   povidone-iodine  2 Application Topical Once   Continuous Infusions:  sodium chloride 75 mL/hr at 01/19/22 2313   amiodarone 60 mg/hr (01/20/22 0957)   Followed by   amiodarone      ceFAZolin (ANCEF) IV     PRN Meds: acetaminophen, albuterol, ALPRAZolam, HYDROcodone-acetaminophen, morphine injection   Vital Signs    Vitals:   01/20/22 0100 01/20/22 0440 01/20/22 0630 01/20/22 0719  BP: (!) 96/52 (!) 99/50 (!) 88/76 108/72  Pulse: (!) 113 (!) 112 (!) 182 (!) 113  Resp: 12 19 20    Temp: 98.9 F (37.2 C) 98.2 F (36.8 C)  97.7 F (36.5 C)  TempSrc: Axillary Oral  Oral  SpO2: 99% 98% 92% 100%  Weight:      Height:       No intake or output data in the 24 hours ending 01/20/22 0959    01/18/2022    4:08 PM 11/19/2021    2:07 PM 05/14/2021    9:19 AM  Last 3 Weights  Weight (lbs) 101 lb 99 lb 12.8 oz 100 lb 6.4 oz  Weight (kg) 45.813 kg 45.269 kg 45.541 kg      Telemetry    Atrial fibrillation with RVR, rates 110s-140s - Personally Reviewed  ECG    01/19/22 atrial fibrillation in the 60s - Personally Reviewed  Physical Exam   GEN: No acute distress.  Very thin woman, in good spirits and no acute distress Neck: No JVD Cardiac: tachycardic, irregularly irregular S1/S2, heart rate limits appreciation of murmurs Respiratory: Clear to auscultation bilaterally. GI: Soft,  nontender, non-distended  MS: No edema; No deformity. Neuro:  Nonfocal  Psych: Normal affect   Labs    High Sensitivity Troponin:   Recent Labs  Lab 01/18/22 1940 01/18/22 2120  TROPONINIHS 17 17     Chemistry Recent Labs  Lab 01/18/22 1656 01/19/22 0326 01/20/22 0418  NA 136 136 137  K 4.3 4.4 3.7  CL 104 107 109  CO2 24 22 21*  GLUCOSE 129* 115* 108*  BUN 22 18 14   CREATININE 0.86 0.84 0.86  CALCIUM 9.2 8.4* 8.1*  GFRNONAA >60 >60 >60  ANIONGAP 8 7 7     Lipids No results for input(s): "CHOL", "TRIG", "HDL", "LABVLDL", "LDLCALC", "CHOLHDL" in the last 168 hours.  Hematology Recent Labs  Lab 01/18/22 1656 01/19/22 0326 01/20/22 0418  WBC 4.8 4.4 6.0  RBC 3.61* 3.25* 3.26*  HGB 10.7* 9.7* 9.7*  HCT 33.2* 29.9* 30.0*  MCV 92.0 92.0 92.0  MCH 29.6 29.8 29.8  MCHC 32.2 32.4 32.3  RDW 14.4 14.5 14.2  PLT 163 137* 114*   Thyroid No results for input(s): "TSH", "FREET4" in the last 168 hours.  BNPNo results for input(s): "BNP", "PROBNP" in the last 168 hours.  DDimer No results for input(s): "DDIMER" in the last 168  hours.   Radiology    DG CHEST PORT 1 VIEW  Result Date: 01/20/2022 CLINICAL DATA:  Shortness of breath. EXAM: PORTABLE CHEST 1 VIEW COMPARISON:  Chest x-ray dated November 19, 2019. FINDINGS: The heart size and mediastinal contours are within normal limits. The lungs remain hyperinflated. Patchy hazy opacities at both lung bases. No pneumothorax or large pleural effusion. No acute osseous abnormality. IMPRESSION: 1. Patchy hazy opacities at both lung bases could reflect pulmonary edema or pneumonia. Electronically Signed   By: Obie Dredge M.D.   On: 01/20/2022 08:55   ECHOCARDIOGRAM COMPLETE  Result Date: 01/19/2022    ECHOCARDIOGRAM REPORT   Patient Name:   [name] Date of Exam: 01/19/2022 Medical Rec #:  [id number]       Height:       65.0 in Accession #:    [id number]      Weight:       101.0 lb Date of Birth:  [birth year]       BSA:           1.480 m Patient Age:    88 years        BP:           123/92 mmHg Patient Gender: F               HR:           129 bpm. [facility] Procedure: 2D Echo, Cardiac Doppler and Color Doppler Indications:    Preoperative evaluation  History:        Patient has prior history of Echocardiogram examinations, most                 recent 09/27/2019. CHF, Arrythmias:Atrial Fibrillation, LBBB and                 Atrial Flutter; Risk Factors:Hypertension, Dyslipidemia and                 Diabetes. Hx of Tachycardia & Palpitations.  Sonographer:    Celesta Gentile RCS Referring Phys: [id number] Betsie Peckman L SCHUMANN IMPRESSIONS  1. Technically difficult study, in Afib with RVR. Left ventricular ejection fraction, by estimation, is 35 to 40%. The left ventricle has moderately decreased function. The left ventricle demonstrates regional wall motion abnormalities. Abnormal (paradoxical) septal motion, consistent with left bundle branch block     There is mild left ventricular hypertrophy. Left ventricular diastolic parameters are indeterminate.  2. Right ventricular systolic function is mildly reduced. The right ventricular size is normal. There is moderately elevated pulmonary artery systolic pressure. The estimated right ventricular systolic pressure is 50.1 mmHg.  3. Left atrial size was severely dilated.  4. The mitral valve is normal in structure. Trivial mitral valve regurgitation.  5. Tricuspid valve regurgitation is moderate to severe.  6. The aortic valve is tricuspid. Aortic valve regurgitation is not visualized. Aortic valve sclerosis is present, with no evidence of aortic valve stenosis.  7. The inferior vena cava is normal in size with greater than 50% respiratory variability, suggesting right atrial pressure of 3 mmHg. FINDINGS  Left Ventricle: Left ventricular ejection fraction, by estimation, is 35 to 40%. The left ventricle has moderately decreased function. The left ventricle demonstrates regional wall  motion abnormalities. The left ventricular internal cavity size was small. There is mild left ventricular hypertrophy. Abnormal (paradoxical) septal motion, consistent with left bundle branch block. Left ventricular diastolic parameters are indeterminate. Right Ventricle: The right ventricular size is normal. Right  vetricular wall thickness was not well visualized. Right ventricular systolic function is mildly reduced. There is moderately elevated pulmonary artery systolic pressure. The tricuspid regurgitant velocity is 3.43 m/s, and with an assumed right atrial pressure of 3 mmHg, the estimated right ventricular systolic pressure is 50.1 mmHg. Left Atrium: Left atrial size was severely dilated. Right Atrium: Right atrial size was normal in size. Pericardium: Trivial pericardial effusion is present. Mitral Valve: The mitral valve is normal in structure. Trivial mitral valve regurgitation. Tricuspid Valve: The tricuspid valve is normal in structure. Tricuspid valve regurgitation is moderate to severe. Aortic Valve: The aortic valve is tricuspid. Aortic valve regurgitation is not visualized. Aortic valve sclerosis is present, with no evidence of aortic valve stenosis. Pulmonic Valve: The pulmonic valve was not well visualized. Pulmonic valve regurgitation is not visualized. Aorta: The aortic root is normal in size and structure. Venous: The inferior vena cava is normal in size with greater than 50% respiratory variability, suggesting right atrial pressure of 3 mmHg. IAS/Shunts: The interatrial septum was not well visualized.  LEFT VENTRICLE PLAX 2D LVIDd:         3.50 cm   Diastology LVIDs:         2.40 cm   LV e' medial:    5.22 cm/s LV PW:         0.90 cm   LV E/e' medial:  20.9 LV IVS:        0.90 cm   LV e' lateral:   5.55 cm/s LVOT diam:     1.80 cm   LV E/e' lateral: 19.6 LV SV:         28 LV SV Index:   19 LVOT Area:     2.54 cm  RIGHT VENTRICLE RV S prime:     12.90 cm/s TAPSE (M-mode): 1.2 cm LEFT ATRIUM              Index        RIGHT ATRIUM           Index LA diam:        3.20 cm 2.16 cm/m   RA Area:     15.80 cm LA Vol (A2C):   87.5 ml 59.14 ml/m  RA Volume:   38.80 ml  26.22 ml/m LA Vol (A4C):   68.0 ml 45.96 ml/m LA Biplane Vol: 81.3 ml 54.95 ml/m  AORTIC VALVE LVOT Vmax:   75.70 cm/s LVOT Vmean:  53.800 cm/s LVOT VTI:    0.109 m  AORTA Ao Root diam: 2.80 cm MITRAL VALVE                TRICUSPID VALVE MV Area (PHT): 5.38 cm     TR Peak grad:   47.1 mmHg MV Decel Time: 141 msec     TR Vmax:        343.00 cm/s MV E velocity: 109.00 cm/s                             SHUNTS                             Systemic VTI:  0.11 m                             Systemic Diam: 1.80 cm Epifanio Lescheshristopher Schumann MD Electronically signed by Epifanio Lescheshristopher Schumann MD Signature  Date/Time: 01/19/2022/5:42:02 PM    Final    DG Shoulder Right  Result Date: 01/18/2022 CLINICAL DATA:  Fall EXAM: RIGHT SHOULDER - 2+ VIEW COMPARISON:  None Available. FINDINGS: No fracture or dislocation is seen. The joint spaces are preserved. The visualized soft tissues are unremarkable. Visualized right lung is clear. IMPRESSION: Negative. Electronically Signed   By: Charline Bills M.D.   On: 01/18/2022 17:37   DG Hip Unilat W or Wo Pelvis 2-3 Views Right  Result Date: 01/18/2022 CLINICAL DATA:  Fall, right hip pain EXAM: DG HIP (WITH OR WITHOUT PELVIS) 2-3V RIGHT COMPARISON:  None Available. FINDINGS: Intertrochanteric right hip fracture with mild foreshortening and varus angulation. Bilateral hip joint spaces are preserved. Visualized bony pelvis appears intact. Mild degenerative changes of the lower lumbar spine. IMPRESSION: Intertrochanteric right hip fracture, as above. Electronically Signed   By: Charline Bills M.D.   On: 01/18/2022 17:36   CT Head Wo Contrast  Result Date: 01/18/2022 CLINICAL DATA:  Fall EXAM: CT HEAD WITHOUT CONTRAST CT CERVICAL SPINE WITHOUT CONTRAST TECHNIQUE: Multidetector CT imaging of the head and cervical spine  was performed following the standard protocol without intravenous contrast. Multiplanar CT image reconstructions of the cervical spine were also generated. RADIATION DOSE REDUCTION: This exam was performed according to the departmental dose-optimization program which includes automated exposure control, adjustment of the mA and/or kV according to patient size and/or use of iterative reconstruction technique. COMPARISON:  None Available. FINDINGS: CT HEAD FINDINGS Brain: No evidence of acute infarction, hemorrhage, hydrocephalus, extra-axial collection or mass lesion/mass effect. Vascular: Intracranial atherosclerosis. Skull: Normal. Negative for fracture or focal lesion. Sinuses/Orbits: The visualized paranasal sinuses are essentially clear. The mastoid air cells are unopacified. Other: None. CT CERVICAL SPINE FINDINGS Alignment: Normal cervical lordosis. Skull base and vertebrae: No acute fracture. No primary bone lesion or focal pathologic process. Soft tissues and spinal canal: No prevertebral fluid or swelling. No visible canal hematoma. Disc levels: Intervertebral disc spaces are maintained. Spinal canal is patent. Upper chest: Visualized lung apices are clear. Other: Multiple left thyroid nodules, measuring up to 18 mm, previously evaluated on thyroid ultrasound. This has been evaluated on previous imaging. (ref: J Am Coll Radiol. 2015 Feb;12(2): 143-50). IMPRESSION: Normal head CT. Normal cervical spine CT. Electronically Signed   By: Charline Bills M.D.   On: 01/18/2022 17:32   CT Cervical Spine Wo Contrast  Result Date: 01/18/2022 CLINICAL DATA:  Fall EXAM: CT HEAD WITHOUT CONTRAST CT CERVICAL SPINE WITHOUT CONTRAST TECHNIQUE: Multidetector CT imaging of the head and cervical spine was performed following the standard protocol without intravenous contrast. Multiplanar CT image reconstructions of the cervical spine were also generated. RADIATION DOSE REDUCTION: This exam was performed according to the  departmental dose-optimization program which includes automated exposure control, adjustment of the mA and/or kV according to patient size and/or use of iterative reconstruction technique. COMPARISON:  None Available. FINDINGS: CT HEAD FINDINGS Brain: No evidence of acute infarction, hemorrhage, hydrocephalus, extra-axial collection or mass lesion/mass effect. Vascular: Intracranial atherosclerosis. Skull: Normal. Negative for fracture or focal lesion. Sinuses/Orbits: The visualized paranasal sinuses are essentially clear. The mastoid air cells are unopacified. Other: None. CT CERVICAL SPINE FINDINGS Alignment: Normal cervical lordosis. Skull base and vertebrae: No acute fracture. No primary bone lesion or focal pathologic process. Soft tissues and spinal canal: No prevertebral fluid or swelling. No visible canal hematoma. Disc levels: Intervertebral disc spaces are maintained. Spinal canal is patent. Upper chest: Visualized lung apices are clear. Other: Multiple left thyroid  nodules, measuring up to 18 mm, previously evaluated on thyroid ultrasound. This has been evaluated on previous imaging. (ref: J Am Coll Radiol. 2015 Feb;12(2): 143-50). IMPRESSION: Normal head CT. Normal cervical spine CT. Electronically Signed   By: Charline Bills M.D.   On: 01/18/2022 17:32    Cardiac Studies   Echo 01/19/22 1. Technically difficult study, in Afib with RVR. Left ventricular  ejection fraction, by estimation, is 35 to 40%. The left ventricle has  moderately decreased function. The left ventricle demonstrates regional  wall motion abnormalities. Abnormal  (paradoxical) septal motion, consistent with left bundle branch block      There is mild left ventricular hypertrophy. Left ventricular diastolic  parameters are indeterminate.   2. Right ventricular systolic function is mildly reduced. The right  ventricular size is normal. There is moderately elevated pulmonary artery  systolic pressure. The estimated right  ventricular systolic pressure is  50.1 mmHg.   3. Left atrial size was severely dilated.   4. The mitral valve is normal in structure. Trivial mitral valve  regurgitation.   5. Tricuspid valve regurgitation is moderate to severe.   6. The aortic valve is tricuspid. Aortic valve regurgitation is not  visualized. Aortic valve sclerosis is present, with no evidence of aortic  valve stenosis.   7. The inferior vena cava is normal in size with greater than 50%  respiratory variability, suggesting right atrial pressure of 3 mmHg.   Patient Profile     88 y.o. female with a hx of persistent A-fib on Eliquis, moderate diastolic dysfunction, severe MR, moderate to severe TR, severe pulmonary hypertension, esophageal dilations, hypertension COPD, hyperlipidemia, LBBB, hypothyroidism, diabetes type 2 seen for preoperative evaluation 01/19/22 and then followed for atrial fibrillation with RVR.  Assessment & Plan    Preoperative evaluation for surgery 2/2 fall resulting in right hip fracture: see note from Dr. Bjorn Pippin 01/19/22. She is at elevated risk as noted, but not reversible risk. Recommended regional anesthesia if possible.  Persistent atrial fibrillation RVR currently -chadsvasc=6 -last dose apixaban 8/28 AM -was on amiodarone in the past, stopped due to hypothyroidism. In the short term, it is reasonable to use IV amiodarone for rate control as she has reduced EF (see below) and borderline blood pressure precluding increase in nodal agents. She was on metoprolol 75 mg BID at home, and this has been continued -restart apixaban postop when clear from surgical perspective  Moderate to severe pulmonary hypertension, most recent RVSP 50 mmHg Newly reduced LVEF 35-40%, suspect 2/2 afib RVR LBBB -trivial MR on most recent study, moderate to severe TR. Appears euvolemic on exam -suspect RVR is driving reduction in EF -no room for additional GDMT at this time, will follow up post op  For questions  or updates, please contact [facility] Please consult [email or web address] for contact info under        Signed, Jodelle Red, MD  01/20/2022, 9:59 AM

## 2022-01-20 NOTE — Consult Note (Signed)
Reason for Consult:Right hip fx Referring Physician: Albertine Grates Time called: 0730 Time at bedside: 0906   [name] is an 88 y.o. female.  HPI: [name] was at home in the kitchen and tripped and fell. She had immediate right hip pain and could not get up. She was brought to the ED at [facility] where x-rays showed a right hip fx. She is too high risk for surgery there so she was transferred to [facility] and orthopedic surgery was consulted. She lives at home alone and uses a cane to ambulate.  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 hiatal hernia. Antral erosions status post gastric biopsy.   ESOPHAGOGASTRODUODENOSCOPY N/A 05/12/2016   Dr. Jena Gauss: moderate Schatzki ring s/p dilation. moderate sized hiatal hernia   MALONEY DILATION N/A 04/17/2014   Procedure: [name] DILATION;  Surgeon: Corbin Ade, MD;  [facility];  Service: Endoscopy;  Laterality: N/A;   MALONEY DILATION N/A 05/12/2016   Procedure: [name] DILATION;  Surgeon: Corbin Ade, MD;  Location: [facility];  Service: Endoscopy;  Laterality: N/A;   none      Family History  Problem Relation Age of Onset   Heart failure Mother        Died in her 87s   Tuberculosis Father        Died at young age   Cancer Brother    Arthritis Other    Colon cancer Other     Social History:  reports that she has quit smoking. She has never used smokeless tobacco. She reports that she does not drink alcohol and does not use drugs.  Allergies:  Allergies   Allergen Reactions   Chocolate     Hives    Medications: I have reviewed the patient's current medications.  Results for orders placed or performed during the hospital encounter of 01/18/22 (from the past 48 hour(s))  CBC with Differential     Status: Abnormal   Collection Time: 01/18/22  4:56 PM  Result Value Ref Range   WBC 4.8 4.0 - 10.5 K/uL   RBC 3.61 (L) 3.87 - 5.11 MIL/uL   Hemoglobin 10.7 (L) 12.0 - 15.0 g/dL   HCT 41.3 (L) 24.4 - 01.0 %   MCV 92.0 80.0 - 100.0 fL   MCH 29.6 26.0 - 34.0 pg   MCHC 32.2 30.0 - 36.0 g/dL   RDW 27.2 53.6 - 64.4 %   Platelets 163 150 - 400 K/uL   nRBC 0.0 0.0 - 0.2 %   Neutrophils Relative % 86 %   Neutro Abs 4.1 1.7 - 7.7 K/uL   Lymphocytes Relative 10 %   Lymphs Abs 0.5 (L) 0.7 - 4.0 K/uL   Monocytes Relative 3 %   Monocytes Absolute 0.2 0.1 - 1.0 K/uL   Eosinophils Relative 0 %   Eosinophils Absolute 0.0 0.0 - 0.5 K/uL   Basophils Relative 1 %   Basophils Absolute 0.0 0.0 - 0.1 K/uL   Immature Granulocytes  0 %   Abs Immature Granulocytes 0.02 0.00 - 0.07 K/uL    Comment: Performed at [facility], [address], [place], [place] [address]  Basic metabolic panel     Status: Abnormal   Collection Time: 01/18/22  4:56 PM  Result Value Ref Range   Sodium 136 135 - 145 mmol/L   Potassium 4.3 3.5 - 5.1 mmol/L   Chloride 104 98 - 111 mmol/L   CO2 24 22 - 32 mmol/L   Glucose, Bld 129 (H) 70 - 99 mg/dL    Comment: Glucose reference range applies only to samples taken after fasting for at least 8 hours.   BUN 22 8 - 23 mg/dL   Creatinine, Ser 6.04 0.44 - 1.00 mg/dL   Calcium 9.2 8.9 - 54.0 mg/dL   GFR, Estimated >98 >11 mL/min    Comment: (NOTE) Calculated using the CKD-EPI Creatinine Equation (2021)    Anion gap 8 5 - 15    Comment: Performed at [facility], [address] [place], [place] [address]  Protime-INR     Status: Abnormal   Collection Time: 01/18/22  5:50 PM  Result Value Ref Range   Prothrombin Time 17.3 (H) 11.4 -  15.2 seconds   INR 1.4 (H) 0.8 - 1.2    Comment: (NOTE) INR goal varies based on device and disease states. Performed at [facility], [address] [place], [place] [address]   Type and screen [facility]     Status: None   Collection Time: 01/18/22  5:50 PM  Result Value Ref Range   ABO/RH(D) B POS    Antibody Screen NEG    Sample Expiration      01/21/2022,2359 Performed at [facility], [address] [place], [place] [address]   Troponin I (High Sensitivity)     Status: None   Collection Time: 01/18/22  7:40 PM  Result Value Ref Range   Troponin I (High Sensitivity) 17 <18 ng/L    Comment: (NOTE) Elevated high sensitivity troponin I (hsTnI) values and significant  changes across serial measurements may suggest ACS but many other  chronic and acute conditions are known to elevate hsTnI results.  Refer to the "Links" section for chest pain algorithms and additional  guidance. Performed at [facility], [address], [place], [place] [address]   Troponin I (High Sensitivity)     Status: None   Collection Time: 01/18/22  9:20 PM  Result Value Ref Range   Troponin I (High Sensitivity) 17 <18 ng/L    Comment: (NOTE) Elevated high sensitivity troponin I (hsTnI) values and significant  changes across serial measurements may suggest ACS but many other  chronic and acute conditions are known to elevate hsTnI results.  Refer to the "Links" section for chest pain algorithms and additional  guidance. Performed at [facility], [address], [place], [place] [address]   CBC     Status: Abnormal   Collection Time: 01/19/22  3:26 AM  Result Value Ref Range   WBC 4.4 4.0 - 10.5 K/uL   RBC 3.25 (L) 3.87 - 5.11 MIL/uL   Hemoglobin 9.7 (L) 12.0 - 15.0 g/dL   HCT 28.4 (L) 13.2 - 44.0 %   MCV 92.0 80.0 - 100.0 fL   MCH 29.8 26.0 - 34.0 pg   MCHC 32.4 30.0 - 36.0 g/dL   RDW 10.2 72.5 - 36.6 %   Platelets 137 (L) 150 - 400 K/uL   nRBC 0.0 0.0 - 0.2 %  Comment: Performed  at [facility], [address], [place], [place] [address]  Basic metabolic panel     Status: Abnormal   Collection Time: 01/19/22  3:26 AM  Result Value Ref Range   Sodium 136 135 - 145 mmol/L   Potassium 4.4 3.5 - 5.1 mmol/L   Chloride 107 98 - 111 mmol/L   CO2 22 22 - 32 mmol/L   Glucose, Bld 115 (H) 70 - 99 mg/dL    Comment: Glucose reference range applies only to samples taken after fasting for at least 8 hours.   BUN 18 8 - 23 mg/dL   Creatinine, Ser 3.71 0.44 - 1.00 mg/dL   Calcium 8.4 (L) 8.9 - 10.3 mg/dL   GFR, Estimated >69 >67 mL/min    Comment: (NOTE) Calculated using the CKD-EPI Creatinine Equation (2021)    Anion gap 7 5 - 15    Comment: Performed at [facility], [address] [place], [place] [address]  VITAMIN D 25 Hydroxy (Vit-D Deficiency, Fractures)     Status: None   Collection Time: 01/19/22  3:26 AM  Result Value Ref Range   Vit D, 25-Hydroxy 33.66 30 - 100 ng/mL    Comment: (NOTE) Vitamin D deficiency has been defined by the Institute of Medicine  and an Endocrine Society practice guideline as a level of serum 25-OH  vitamin D less than 20 ng/mL (1,2). The Endocrine Society went on to  further define vitamin D insufficiency as a level between 21 and 29  ng/mL (2).  1. IOM (Institute of Medicine). 2010. Dietary reference intakes for  calcium and D. [place]: [employer]. 2. Holick MF, Binkley , Bischoff-Ferrari HA, et al. Evaluation,  treatment, and prevention of vitamin D deficiency: an Endocrine  Society clinical practice guideline, JCEM. 2011 Jul; 96(7): 1911-30.  Performed at [facility], [address], [place], [place] [address]   CBG monitoring, ED     Status: Abnormal   Collection Time: 01/19/22  6:28 PM  Result Value Ref Range   Glucose-Capillary 110 (H) 70 - 99 mg/dL    Comment: Glucose reference range applies only to samples taken after fasting for at least 8 hours.  Surgical pcr screen     Status: None    Collection Time: 01/19/22 11:49 PM   Specimen: Nasal Mucosa; Nasal Swab  Result Value Ref Range   MRSA, PCR NEGATIVE NEGATIVE   Staphylococcus aureus NEGATIVE NEGATIVE    Comment: (NOTE) The Xpert SA Assay (FDA approved for NASAL specimens in patients 91 years of age and older), is one component of a comprehensive surveillance program. It is not intended to diagnose infection nor to guide or monitor treatment. Performed at [facility], [address] [place], [place] [address]   Basic metabolic panel     Status: Abnormal   Collection Time: 01/20/22  4:18 AM  Result Value Ref Range   Sodium 137 135 - 145 mmol/L   Potassium 3.7 3.5 - 5.1 mmol/L   Chloride 109 98 - 111 mmol/L   CO2 21 (L) 22 - 32 mmol/L   Glucose, Bld 108 (H) 70 - 99 mg/dL    Comment: Glucose reference range applies only to samples taken after fasting for at least 8 hours.   BUN 14 8 - 23 mg/dL   Creatinine, Ser 2.77 0.44 - 1.00 mg/dL   Calcium 8.1 (L) 8.9 - 10.3 mg/dL   GFR, Estimated >82 >42 mL/min    Comment: (  NOTE) Calculated using the CKD-EPI Creatinine Equation (2021)    Anion gap 7 5 - 15    Comment: Performed at [facility], [address] [place], [place] [address]  CBC     Status: Abnormal   Collection Time: 01/20/22  4:18 AM  Result Value Ref Range   WBC 6.0 4.0 - 10.5 K/uL   RBC 3.26 (L) 3.87 - 5.11 MIL/uL   Hemoglobin 9.7 (L) 12.0 - 15.0 g/dL   HCT 60.430.0 (L) 54.036.0 - 98.146.0 %   MCV 92.0 80.0 - 100.0 fL   MCH 29.8 26.0 - 34.0 pg   MCHC 32.3 30.0 - 36.0 g/dL   RDW 19.114.2 47.811.5 - 29.515.5 %   Platelets 114 (L) 150 - 400 K/uL    Comment: REPEATED TO VERIFY   nRBC 0.0 0.0 - 0.2 %    Comment: Performed at [facility], [address] [place], [place] [address]    DG CHEST PORT 1 VIEW  Result Date: 01/20/2022 CLINICAL DATA:  Shortness of breath. EXAM: PORTABLE CHEST 1 VIEW COMPARISON:  Chest x-ray dated November 19, 2019. FINDINGS: The heart size and mediastinal contours are within normal  limits. The lungs remain hyperinflated. Patchy hazy opacities at both lung bases. No pneumothorax or large pleural effusion. No acute osseous abnormality. IMPRESSION: 1. Patchy hazy opacities at both lung bases could reflect pulmonary edema or pneumonia. Electronically Signed   By: Obie DredgeWilliam T Derry M.D.   On: 01/20/2022 08:55   ECHOCARDIOGRAM COMPLETE  Result Date: 01/19/2022    ECHOCARDIOGRAM REPORT   Patient Name:   [name] Date of Exam: 01/19/2022 Medical Rec #:  [id number]       Height:       65.0 in Accession #:    [id number]      Weight:       101.0 lb Date of Birth:  02/03/1927       BSA:          1.480 m Patient Age:    89 years        BP:           123/92 mmHg Patient Gender: F               HR:           129 bpm. [facility] Procedure: 2D Echo, Cardiac Doppler and Color Doppler Indications:    Preoperative evaluation  History:        Patient has prior history of Echocardiogram examinations, most                 recent 09/27/2019. CHF, Arrythmias:Atrial Fibrillation, LBBB and                 Atrial Flutter; Risk Factors:Hypertension, Dyslipidemia and                 Diabetes. Hx of Tachycardia & Palpitations.  Sonographer:    Celesta GentileBernard White RCS Referring Phys: [id number] CHRISTOPHER L SCHUMANN IMPRESSIONS  1. Technically difficult study, in Afib with RVR. Left ventricular ejection fraction, by estimation, is 35 to 40%. The left ventricle has moderately decreased function. The left ventricle demonstrates regional wall motion abnormalities. Abnormal (paradoxical) septal motion, consistent with left bundle branch block     There is mild left ventricular hypertrophy. Left ventricular diastolic parameters are indeterminate.  2. Right ventricular systolic function is mildly reduced. The right ventricular size is normal. There is moderately elevated pulmonary  artery systolic pressure. The estimated right ventricular systolic pressure is 50.1 mmHg.  3. Left atrial size was severely dilated.  4.  The mitral valve is normal in structure. Trivial mitral valve regurgitation.  5. Tricuspid valve regurgitation is moderate to severe.  6. The aortic valve is tricuspid. Aortic valve regurgitation is not visualized. Aortic valve sclerosis is present, with no evidence of aortic valve stenosis.  7. The inferior vena cava is normal in size with greater than 50% respiratory variability, suggesting right atrial pressure of 3 mmHg. FINDINGS  Left Ventricle: Left ventricular ejection fraction, by estimation, is 35 to 40%. The left ventricle has moderately decreased function. The left ventricle demonstrates regional wall motion abnormalities. The left ventricular internal cavity size was small. There is mild left ventricular hypertrophy. Abnormal (paradoxical) septal motion, consistent with left bundle branch block. Left ventricular diastolic parameters are indeterminate. Right Ventricle: The right ventricular size is normal. Right vetricular wall thickness was not well visualized. Right ventricular systolic function is mildly reduced. There is moderately elevated pulmonary artery systolic pressure. The tricuspid regurgitant velocity is 3.43 m/s, and with an assumed right atrial pressure of 3 mmHg, the estimated right ventricular systolic pressure is 50.1 mmHg. Left Atrium: Left atrial size was severely dilated. Right Atrium: Right atrial size was normal in size. Pericardium: Trivial pericardial effusion is present. Mitral Valve: The mitral valve is normal in structure. Trivial mitral valve regurgitation. Tricuspid Valve: The tricuspid valve is normal in structure. Tricuspid valve regurgitation is moderate to severe. Aortic Valve: The aortic valve is tricuspid. Aortic valve regurgitation is not visualized. Aortic valve sclerosis is present, with no evidence of aortic valve stenosis. Pulmonic Valve: The pulmonic valve was not well visualized. Pulmonic valve regurgitation is not visualized. Aorta: The aortic root is normal in  size and structure. Venous: The inferior vena cava is normal in size with greater than 50% respiratory variability, suggesting right atrial pressure of 3 mmHg. IAS/Shunts: The interatrial septum was not well visualized.  LEFT VENTRICLE PLAX 2D LVIDd:         3.50 cm   Diastology LVIDs:         2.40 cm   LV e' medial:    5.22 cm/s LV PW:         0.90 cm   LV E/e' medial:  20.9 LV IVS:        0.90 cm   LV e' lateral:   5.55 cm/s LVOT diam:     1.80 cm   LV E/e' lateral: 19.6 LV SV:         28 LV SV Index:   19 LVOT Area:     2.54 cm  RIGHT VENTRICLE RV S prime:     12.90 cm/s TAPSE (M-mode): 1.2 cm LEFT ATRIUM             Index        RIGHT ATRIUM           Index LA diam:        3.20 cm 2.16 cm/m   RA Area:     15.80 cm LA Vol (A2C):   87.5 ml 59.14 ml/m  RA Volume:   38.80 ml  26.22 ml/m LA Vol (A4C):   68.0 ml 45.96 ml/m LA Biplane Vol: 81.3 ml 54.95 ml/m  AORTIC VALVE LVOT Vmax:   75.70 cm/s LVOT Vmean:  53.800 cm/s LVOT VTI:    0.109 m  AORTA Ao Root diam: 2.80 cm MITRAL VALVE  TRICUSPID VALVE MV Area (PHT): 5.38 cm     TR Peak grad:   47.1 mmHg MV Decel Time: 141 msec     TR Vmax:        343.00 cm/s MV E velocity: 109.00 cm/s                             SHUNTS                             Systemic VTI:  0.11 m                             Systemic Diam: 1.80 cm Epifanio Lesches MD Electronically signed by Epifanio Lesches MD Signature Date/Time: 01/19/2022/5:42:02 PM    Final    DG Shoulder Right  Result Date: 01/18/2022 CLINICAL DATA:  Fall EXAM: RIGHT SHOULDER - 2+ VIEW COMPARISON:  None Available. FINDINGS: No fracture or dislocation is seen. The joint spaces are preserved. The visualized soft tissues are unremarkable. Visualized right lung is clear. IMPRESSION: Negative. Electronically Signed   By: Charline Bills M.D.   On: 01/18/2022 17:37   DG Hip Unilat W or Wo Pelvis 2-3 Views Right  Result Date: 01/18/2022 CLINICAL DATA:  Fall, right hip pain EXAM: DG HIP (WITH OR  WITHOUT PELVIS) 2-3V RIGHT COMPARISON:  None Available. FINDINGS: Intertrochanteric right hip fracture with mild foreshortening and varus angulation. Bilateral hip joint spaces are preserved. Visualized bony pelvis appears intact. Mild degenerative changes of the lower lumbar spine. IMPRESSION: Intertrochanteric right hip fracture, as above. Electronically Signed   By: Charline Bills M.D.   On: 01/18/2022 17:36   CT Head Wo Contrast  Result Date: 01/18/2022 CLINICAL DATA:  Fall EXAM: CT HEAD WITHOUT CONTRAST CT CERVICAL SPINE WITHOUT CONTRAST TECHNIQUE: Multidetector CT imaging of the head and cervical spine was performed following the standard protocol without intravenous contrast. Multiplanar CT image reconstructions of the cervical spine were also generated. RADIATION DOSE REDUCTION: This exam was performed according to the departmental dose-optimization program which includes automated exposure control, adjustment of the mA and/or kV according to patient size and/or use of iterative reconstruction technique. COMPARISON:  None Available. FINDINGS: CT HEAD FINDINGS Brain: No evidence of acute infarction, hemorrhage, hydrocephalus, extra-axial collection or mass lesion/mass effect. Vascular: Intracranial atherosclerosis. Skull: Normal. Negative for fracture or focal lesion. Sinuses/Orbits: The visualized paranasal sinuses are essentially clear. The mastoid air cells are unopacified. Other: None. CT CERVICAL SPINE FINDINGS Alignment: Normal cervical lordosis. Skull base and vertebrae: No acute fracture. No primary bone lesion or focal pathologic process. Soft tissues and spinal canal: No prevertebral fluid or swelling. No visible canal hematoma. Disc levels: Intervertebral disc spaces are maintained. Spinal canal is patent. Upper chest: Visualized lung apices are clear. Other: Multiple left thyroid nodules, measuring up to 18 mm, previously evaluated on thyroid ultrasound. This has been evaluated on previous  imaging. (ref: J Am Coll Radiol. 2015 Feb;12(2): 143-50). IMPRESSION: Normal head CT. Normal cervical spine CT. Electronically Signed   By: Charline Bills M.D.   On: 01/18/2022 17:32   CT Cervical Spine Wo Contrast  Result Date: 01/18/2022 CLINICAL DATA:  Fall EXAM: CT HEAD WITHOUT CONTRAST CT CERVICAL SPINE WITHOUT CONTRAST TECHNIQUE: Multidetector CT imaging of the head and cervical spine was performed following the standard protocol without intravenous contrast. Multiplanar CT image reconstructions of the  cervical spine were also generated. RADIATION DOSE REDUCTION: This exam was performed according to the departmental dose-optimization program which includes automated exposure control, adjustment of the mA and/or kV according to patient size and/or use of iterative reconstruction technique. COMPARISON:  None Available. FINDINGS: CT HEAD FINDINGS Brain: No evidence of acute infarction, hemorrhage, hydrocephalus, extra-axial collection or mass lesion/mass effect. Vascular: Intracranial atherosclerosis. Skull: Normal. Negative for fracture or focal lesion. Sinuses/Orbits: The visualized paranasal sinuses are essentially clear. The mastoid air cells are unopacified. Other: None. CT CERVICAL SPINE FINDINGS Alignment: Normal cervical lordosis. Skull base and vertebrae: No acute fracture. No primary bone lesion or focal pathologic process. Soft tissues and spinal canal: No prevertebral fluid or swelling. No visible canal hematoma. Disc levels: Intervertebral disc spaces are maintained. Spinal canal is patent. Upper chest: Visualized lung apices are clear. Other: Multiple left thyroid nodules, measuring up to 18 mm, previously evaluated on thyroid ultrasound. This has been evaluated on previous imaging. (ref: J Am Coll Radiol. 2015 Feb;12(2): 143-50). IMPRESSION: Normal head CT. Normal cervical spine CT. Electronically Signed   By: Charline Bills M.D.   On: 01/18/2022 17:32    Review of Systems  HENT:   Negative for ear discharge, ear pain, hearing loss and tinnitus.   Eyes:  Negative for photophobia and pain.  Respiratory:  Negative for cough and shortness of breath.   Cardiovascular:  Negative for chest pain.  Gastrointestinal:  Negative for abdominal pain, nausea and vomiting.  Genitourinary:  Negative for dysuria, flank pain, frequency and urgency.  Musculoskeletal:  Positive for arthralgias (Right hip). Negative for back pain, myalgias and neck pain.  Neurological:  Negative for dizziness and headaches.  Hematological:  Does not bruise/bleed easily.  Psychiatric/Behavioral:  The patient is not nervous/anxious.    Blood pressure 108/72, pulse (!) 113, temperature 97.7 F (36.5 C), temperature source Oral, resp. rate 20, height  (1.651 m), weight 45.8 kg, SpO2 100 %. Physical Exam Constitutional:      General: She is not in acute distress.    Appearance: She is well-developed. She is not diaphoretic.  HENT:     Head: Normocephalic and atraumatic.  Eyes:     General: No scleral icterus.       Right eye: No discharge.        Left eye: No discharge.     Conjunctiva/sclera: Conjunctivae normal.  Cardiovascular:     Rate and Rhythm: Regular rhythm. Tachycardia present.  Pulmonary:     Effort: Pulmonary effort is normal. No respiratory distress.  Musculoskeletal:     Cervical back: Normal range of motion.     Comments: RLE No traumatic wounds, ecchymosis, or rash  Mild TTP hip  No knee or ankle effusion  Knee stable to varus/ valgus and anterior/posterior stress  Sens DPN, SPN, TN intact  Motor EHL, ext, flex, evers 5/5  DP 1+, PT 0, No significant edema  Skin:    General: Skin is warm and dry.  Neurological:     Mental Status: She is alert.  Psychiatric:        Mood and Affect: Mood normal.        Behavior: Behavior normal.     Assessment/Plan: Right hip fx -- Plan IMN today with Dr. Jena Gauss. Please keep NPO. Multiple medical problems including A-fib on Eliquis,  moderate diastolic dysfunction, severe MR, moderate to severe TR, severe pulmonary hypertension, esophageal dilations, hypertension, COPD, hyperlipidemia, LBBB, hypothyroidism, and diabetes type 2 -- per primary service and cardiology  Freeman Caldron, PA-C Orthopedic Surgery [phone] 01/20/2022, 9:31 AM

## 2022-01-20 NOTE — Progress Notes (Signed)
HOSPITAL MEDICINE OVERNIGHT EVENT NOTE    Notified by nursing the patient has been exhibiting rapid atrial fibrillation since arrival to the unit from [facility].    It seems that patient was exhibiting rapid atrial fibrillation just prior to transfer and was given a 10 mg intravenous diltiazem push which temporarily controlled the rate.  Now since arrival to the unit on 5 N. the patient has begun to exhibit progressively worsening rate control with heart rates now as high as the 140s.    Patient has been on metoprolol 75 mg p.o. twice daily with the last dose being given yesterday evening.  Chart reviewed.  Review of outpatient cardiology notes reveals patient has historically been on amiodarone however this was discontinued due to thyroid dysfunction.  Patient also has a known history of depressed ejection fraction with echocardiogram on 8/29 revealing an ejection fraction of 35 to 40%.  Considering the discontinuation of amiodarone in the past this will not be reinitiated.  Diltiazem typically is not given in patients with depressed ejection fractions although this patient's ejection fraction is only moderately depressed and she may still tolerate this drug well.  For now, we will give the patient 2.5 mg of intravenous metoprolol and see how the patient responds to this.  Patient should receive her oral dose of 75 mg of oral metoprolol at approximately 10 AM as well.  I have discussed the case with Dr. Roda Shutters with the day team who will follow up on how the patient responds to metoprolol and if the patient does not respond effectively she may have to coordinate with cardiology to determine if diltiazem or digoxin will be the next best option.   Marinda Elk  MD [facility]

## 2022-01-20 NOTE — Anesthesia Postprocedure Evaluation (Signed)
Anesthesia Post Note  Patient: [name]  Procedure(s) Performed: INTRAMEDULLARY (IM) NAIL INTERTROCHANTERIC (Right: Hip)     Patient location during evaluation: PACU Anesthesia Type: General Level of consciousness: awake Pain management: pain level controlled Vital Signs Assessment: post-procedure vital signs reviewed and stable Respiratory status: spontaneous breathing, nonlabored ventilation, respiratory function stable and patient connected to nasal cannula oxygen Cardiovascular status: blood pressure returned to baseline and stable Postop Assessment: no apparent nausea or vomiting Anesthetic complications: no   No notable events documented.  Last Vitals:  Vitals:   01/20/22 1701 01/20/22 2012  BP: (!) 140/75 (!) 139/46  Pulse: 70 76  Resp:  16  Temp: (!) 36.4 C   SpO2: 100% 93%    Last Pain:  Vitals:   01/20/22 1701  TempSrc: Oral  PainSc:                  Tammy Ericsson P Jaxan Michel

## 2022-01-20 NOTE — Progress Notes (Signed)
Initial Nutrition Assessment  DOCUMENTATION CODES:   Underweight  INTERVENTION:  Once diet resumes, recommend: Regular diet Ensure Enlive po BID, each supplement provides 350 kcal and 20 grams of protein. MVI with minerals daily Consider SLP evaluation if ongoing difficulty with dysphagia  NUTRITION DIAGNOSIS:   Increased nutrient needs related to post-op healing, hip fracture as evidenced by estimated needs.  GOAL:   Patient will meet greater than or equal to 90% of their needs  MONITOR:   PO intake, Supplement acceptance, Diet advancement, Labs, Weight trends, Skin  REASON FOR ASSESSMENT:   Consult Assessment of nutrition requirement/status, Hip fracture protocol  ASSESSMENT:   Pt experienced a fall leading to R hip fx. PMH significant for afib/aflutter, HTN, L bundle branch block, T2DM.  Plans for IMN today   Patient off unit at time of visit for IMN. Unable to obtained detailed nutrition related history. She would likely benefit from the addition of nutrition supplement to aid in post-op healing. Will add supplements and follow up as appropriate.   Per review of chart, pt with prior dysphagia, s/p scope in 2015. Per MD, plans to monitor trends post operatively. She was previously on dysphagia 2 diet.   Per review of weight history, pt's weight appears to have remained stable within the last 8 months. Will continue to monitor throughout admission  Medications: synthroid, protonix  Labs: CBG 110  NUTRITION - FOCUSED PHYSICAL EXAM: Pt off unit. Deferred to follow up.   Diet Order:   Diet Order             Diet NPO time specified Except for: Sips with Meds  Diet effective midnight                   EDUCATION NEEDS:   No education needs have been identified at this time  Skin:  Skin Assessment: Reviewed RN Assessment  Last BM:  PTA  Height:   Ht Readings from Last 1 Encounters:  01/20/22 5\' 5"  (1.651 m)    Weight:   Wt Readings from Last 1  Encounters:  01/20/22 45.8 kg   BMI:  Body mass index is 16.8 kg/m.  Estimated Nutritional Needs:   Kcal:  1400-1600  Protein:  65-75g  Fluid:  >/=1.5L  01/22/22, RDN, LDN Clinical Nutrition

## 2022-01-20 NOTE — H&P (View-Only) (Signed)
Reason for Consult:Right hip fx Referring Physician: Fang Xu Time called: 0730 Time at bedside: 0906   [name] is an 88 y.o. female.  HPI: [name] was at home in the kitchen and tripped and fell. She had immediate right hip pain and could not get up. She was brought to the ED at [facility] where x-rays showed a right hip fx. She is too high risk for surgery there so she was transferred to [facility] and orthopedic surgery was consulted. She lives at home alone and uses a cane to ambulate.  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 hiatal hernia. Antral erosions status post gastric biopsy.   ESOPHAGOGASTRODUODENOSCOPY N/A 05/12/2016   Dr. Rourk: moderate Schatzki ring s/p dilation. moderate sized hiatal hernia   MALONEY DILATION N/A 04/17/2014   Procedure: MALONEY DILATION;  Surgeon: Robert M Rourk, MD;  Location: [facility];  Service: Endoscopy;  Laterality: N/A;   MALONEY DILATION N/A 05/12/2016   Procedure: MALONEY DILATION;  Surgeon: Robert M Rourk, MD;  Location: [facility];  Service: Endoscopy;  Laterality: N/A;   none      Family History  Problem Relation Age of Onset   Heart failure Mother        Died in her 70s   Tuberculosis Father        Died at young age   Cancer Brother    Arthritis Other    Colon cancer Other     Social History:  reports that she has quit smoking. She has never used smokeless tobacco. She reports that she does not drink alcohol and does not use drugs.  Allergies:  Allergies   Allergen Reactions   Chocolate     Hives    Medications: I have reviewed the patient's current medications.  Results for orders placed or performed during the hospital encounter of 01/18/22 (from the past 48 hour(s))  CBC with Differential     Status: Abnormal   Collection Time: 01/18/22  4:56 PM  Result Value Ref Range   WBC 4.8 4.0 - 10.5 K/uL   RBC 3.61 (L) 3.87 - 5.11 MIL/uL   Hemoglobin 10.7 (L) 12.0 - 15.0 g/dL   HCT 33.2 (L) 36.0 - 46.0 %   MCV 92.0 80.0 - 100.0 fL   MCH 29.6 26.0 - 34.0 pg   MCHC 32.2 30.0 - 36.0 g/dL   RDW 14.4 11.5 - 15.5 %   Platelets 163 150 - 400 K/uL   nRBC 0.0 0.0 - 0.2 %   Neutrophils Relative % 86 %   Neutro Abs 4.1 1.7 - 7.7 K/uL   Lymphocytes Relative 10 %   Lymphs Abs 0.5 (L) 0.7 - 4.0 K/uL   Monocytes Relative 3 %   Monocytes Absolute 0.2 0.1 - 1.0 K/uL   Eosinophils Relative 0 %   Eosinophils Absolute 0.0 0.0 - 0.5 K/uL   Basophils Relative 1 %   Basophils Absolute 0.0 0.0 - 0.1 K/uL   Immature Granulocytes   0 %   Abs Immature Granulocytes 0.02 0.00 - 0.07 K/uL    Comment: Performed at [facility], [address] [place], [place] [address]  Basic metabolic panel     Status: Abnormal   Collection Time: 01/18/22  4:56 PM  Result Value Ref Range   Sodium 136 135 - 145 mmol/L   Potassium 4.3 3.5 - 5.1 mmol/L   Chloride 104 98 - 111 mmol/L   CO2 24 22 - 32 mmol/L   Glucose, Bld 129 (H) 70 - 99 mg/dL    Comment: Glucose reference range applies only to samples taken after fasting for at least 8 hours.   BUN 22 8 - 23 mg/dL   Creatinine, Ser 0.86 0.44 - 1.00 mg/dL   Calcium 9.2 8.9 - 10.3 mg/dL   GFR, Estimated >60 >60 mL/min    Comment: (NOTE) Calculated using the CKD-EPI Creatinine Equation (2021)    Anion gap 8 5 - 15    Comment: Performed at [facility], [address] [place], [place] [address]  Protime-INR     Status: Abnormal   Collection Time: 01/18/22  5:50 PM  Result Value Ref Range   Prothrombin Time 17.3 (H) 11.4 -  15.2 seconds   INR 1.4 (H) 0.8 - 1.2    Comment: (NOTE) INR goal varies based on device and disease states. Performed at [facility], [address] [place], [place] [address]   Type and screen [facility]     Status: None   Collection Time: 01/18/22  5:50 PM  Result Value Ref Range   ABO/RH(D) B POS    Antibody Screen NEG    Sample Expiration      01/21/2022,2359 Performed at [facility], [address] [place], [place] [address]   Troponin I (High Sensitivity)     Status: None   Collection Time: 01/18/22  7:40 PM  Result Value Ref Range   Troponin I (High Sensitivity) 17 <18 ng/L    Comment: (NOTE) Elevated high sensitivity troponin I (hsTnI) values and significant  changes across serial measurements may suggest ACS but many other  chronic and acute conditions are known to elevate hsTnI results.  Refer to the "Links" section for chest pain algorithms and additional  guidance. Performed at [facility], [address] [place], [place] [address]   Troponin I (High Sensitivity)     Status: None   Collection Time: 01/18/22  9:20 PM  Result Value Ref Range   Troponin I (High Sensitivity) 17 <18 ng/L    Comment: (NOTE) Elevated high sensitivity troponin I (hsTnI) values and significant  changes across serial measurements may suggest ACS but many other  chronic and acute conditions are known to elevate hsTnI results.  Refer to the "Links" section for chest pain algorithms and additional  guidance. Performed at [facility], [address] [place], [place] [address]   CBC     Status: Abnormal   Collection Time: 01/19/22  3:26 AM  Result Value Ref Range   WBC 4.4 4.0 - 10.5 K/uL   RBC 3.25 (L) 3.87 - 5.11 MIL/uL   Hemoglobin 9.7 (L) 12.0 - 15.0 g/dL   HCT 29.9 (L) 36.0 - 46.0 %   MCV 92.0 80.0 - 100.0 fL   MCH 29.8 26.0 - 34.0 pg   MCHC 32.4 30.0 - 36.0 g/dL   RDW 14.5 11.5 - 15.5 %   Platelets 137 (L) 150 - 400 K/uL   nRBC 0.0 0.0 - 0.2 %      Comment: Performed  at [facility], [address] [place], [place] [address]  Basic metabolic panel     Status: Abnormal   Collection Time: 01/19/22  3:26 AM  Result Value Ref Range   Sodium 136 135 - 145 mmol/L   Potassium 4.4 3.5 - 5.1 mmol/L   Chloride 107 98 - 111 mmol/L   CO2 22 22 - 32 mmol/L   Glucose, Bld 115 (H) 70 - 99 mg/dL    Comment: Glucose reference range applies only to samples taken after fasting for at least 8 hours.   BUN 18 8 - 23 mg/dL   Creatinine, Ser 0.84 0.44 - 1.00 mg/dL   Calcium 8.4 (L) 8.9 - 10.3 mg/dL   GFR, Estimated >60 >60 mL/min    Comment: (NOTE) Calculated using the CKD-EPI Creatinine Equation (2021)    Anion gap 7 5 - 15    Comment: Performed at [facility], [address] [place], [place] [address]  VITAMIN D 25 Hydroxy (Vit-D Deficiency, Fractures)     Status: None   Collection Time: 01/19/22  3:26 AM  Result Value Ref Range   Vit D, 25-Hydroxy 33.66 30 - 100 ng/mL    Comment: (NOTE) Vitamin D deficiency has been defined by the Institute of Medicine  and an Endocrine Society practice guideline as a level of serum 25-OH  vitamin D less than 20 ng/mL (1,2). The Endocrine Society went on to  further define vitamin D insufficiency as a level between 21 and 29  ng/mL (2).  1. IOM (Institute of Medicine). 2010. Dietary reference intakes for  calcium and D. [place]: The National Academies Press. 2. Holick MF, Binkley Harper, Bischoff-Ferrari HA, et al. Evaluation,  treatment, and prevention of vitamin D deficiency: an Endocrine  Society clinical practice guideline, JCEM. 2011 Jul; 96(7): 1911-30.  Performed at [facility], [address] [place], [place] [address]   CBG monitoring, ED     Status: Abnormal   Collection Time: 01/19/22  6:28 PM  Result Value Ref Range   Glucose-Capillary 110 (H) 70 - 99 mg/dL    Comment: Glucose reference range applies only to samples taken after fasting for at least 8 hours.  Surgical pcr screen     Status: None    Collection Time: 01/19/22 11:49 PM   Specimen: Nasal Mucosa; Nasal Swab  Result Value Ref Range   MRSA, PCR NEGATIVE NEGATIVE   Staphylococcus aureus NEGATIVE NEGATIVE    Comment: (NOTE) The Xpert SA Assay (FDA approved for NASAL specimens in patients 22 years of age and older), is one component of a comprehensive surveillance program. It is not intended to diagnose infection nor to guide or monitor treatment. Performed at [facility], [address] [place], [place] [address]   Basic metabolic panel     Status: Abnormal   Collection Time: 01/20/22  4:18 AM  Result Value Ref Range   Sodium 137 135 - 145 mmol/L   Potassium 3.7 3.5 - 5.1 mmol/L   Chloride 109 98 - 111 mmol/L   CO2 21 (L) 22 - 32 mmol/L   Glucose, Bld 108 (H) 70 - 99 mg/dL    Comment: Glucose reference range applies only to samples taken after fasting for at least 8 hours.   BUN 14 8 - 23 mg/dL   Creatinine, Ser 0.86 0.44 - 1.00 mg/dL   Calcium 8.1 (L) 8.9 - 10.3 mg/dL   GFR, Estimated >60 >60 mL/min    Comment: (  NOTE) Calculated using the CKD-EPI Creatinine Equation (2021)    Anion gap 7 5 - 15    Comment: Performed at [facility], [address] [place], [place] [address]  CBC     Status: Abnormal   Collection Time: 01/20/22  4:18 AM  Result Value Ref Range   WBC 6.0 4.0 - 10.5 K/uL   RBC 3.26 (L) 3.87 - 5.11 MIL/uL   Hemoglobin 9.7 (L) 12.0 - 15.0 g/dL   HCT 30.0 (L) 36.0 - 46.0 %   MCV 92.0 80.0 - 100.0 fL   MCH 29.8 26.0 - 34.0 pg   MCHC 32.3 30.0 - 36.0 g/dL   RDW 14.2 11.5 - 15.5 %   Platelets 114 (L) 150 - 400 K/uL    Comment: REPEATED TO VERIFY   nRBC 0.0 0.0 - 0.2 %    Comment: Performed at [facility], [address] [place], [place] [address]    DG CHEST PORT 1 VIEW  Result Date: 01/20/2022 CLINICAL DATA:  Shortness of breath. EXAM: PORTABLE CHEST 1 VIEW COMPARISON:  Chest x-ray dated November 19, 2019. FINDINGS: The heart size and mediastinal contours are within normal  limits. The lungs remain hyperinflated. Patchy hazy opacities at both lung bases. No pneumothorax or large pleural effusion. No acute osseous abnormality. IMPRESSION: 1. Patchy hazy opacities at both lung bases could reflect pulmonary edema or pneumonia. Electronically Signed   By: William T Derry M.D.   On: 01/20/2022 08:55   ECHOCARDIOGRAM COMPLETE  Result Date: 01/19/2022    ECHOCARDIOGRAM REPORT   Patient Name:   [name] Date of Exam: 01/19/2022 Medical Rec #:  [id number]       Height:       65.0 in Accession #:    [id number]      Weight:       101.0 lb Date of Birth:  05/13/1927       BSA:          1.480 m Patient Age:    88 years        BP:           123/92 mmHg Patient Gender: F               HR:           129 bpm. Exam Location:  [place] Procedure: 2D Echo, Cardiac Doppler and Color Doppler Indications:    Preoperative evaluation  History:        Patient has prior history of Echocardiogram examinations, most                 recent 09/27/2019. CHF, Arrythmias:Atrial Fibrillation, LBBB and                 Atrial Flutter; Risk Factors:Hypertension, Dyslipidemia and                 Diabetes. Hx of Tachycardia & Palpitations.  Sonographer:    Bernard White RCS Referring Phys: [id number] CHRISTOPHER L SCHUMANN IMPRESSIONS  1. Technically difficult study, in Afib with RVR. Left ventricular ejection fraction, by estimation, is 35 to 40%. The left ventricle has moderately decreased function. The left ventricle demonstrates regional wall motion abnormalities. Abnormal (paradoxical) septal motion, consistent with left bundle branch block     There is mild left ventricular hypertrophy. Left ventricular diastolic parameters are indeterminate.  2. Right ventricular systolic function is mildly reduced. The right ventricular size is normal. There is moderately elevated pulmonary   artery systolic pressure. The estimated right ventricular systolic pressure is 50.1 mmHg.  3. Left atrial size was severely dilated.  4.  The mitral valve is normal in structure. Trivial mitral valve regurgitation.  5. Tricuspid valve regurgitation is moderate to severe.  6. The aortic valve is tricuspid. Aortic valve regurgitation is not visualized. Aortic valve sclerosis is present, with no evidence of aortic valve stenosis.  7. The inferior vena cava is normal in size with greater than 50% respiratory variability, suggesting right atrial pressure of 3 mmHg. FINDINGS  Left Ventricle: Left ventricular ejection fraction, by estimation, is 35 to 40%. The left ventricle has moderately decreased function. The left ventricle demonstrates regional wall motion abnormalities. The left ventricular internal cavity size was small. There is mild left ventricular hypertrophy. Abnormal (paradoxical) septal motion, consistent with left bundle branch block. Left ventricular diastolic parameters are indeterminate. Right Ventricle: The right ventricular size is normal. Right vetricular wall thickness was not well visualized. Right ventricular systolic function is mildly reduced. There is moderately elevated pulmonary artery systolic pressure. The tricuspid regurgitant velocity is 3.43 m/s, and with an assumed right atrial pressure of 3 mmHg, the estimated right ventricular systolic pressure is 50.1 mmHg. Left Atrium: Left atrial size was severely dilated. Right Atrium: Right atrial size was normal in size. Pericardium: Trivial pericardial effusion is present. Mitral Valve: The mitral valve is normal in structure. Trivial mitral valve regurgitation. Tricuspid Valve: The tricuspid valve is normal in structure. Tricuspid valve regurgitation is moderate to severe. Aortic Valve: The aortic valve is tricuspid. Aortic valve regurgitation is not visualized. Aortic valve sclerosis is present, with no evidence of aortic valve stenosis. Pulmonic Valve: The pulmonic valve was not well visualized. Pulmonic valve regurgitation is not visualized. Aorta: The aortic root is normal in  size and structure. Venous: The inferior vena cava is normal in size with greater than 50% respiratory variability, suggesting right atrial pressure of 3 mmHg. IAS/Shunts: The interatrial septum was not well visualized.  LEFT VENTRICLE PLAX 2D LVIDd:         3.50 cm   Diastology LVIDs:         2.40 cm   LV e' medial:    5.22 cm/s LV PW:         0.90 cm   LV E/e' medial:  20.9 LV IVS:        0.90 cm   LV e' lateral:   5.55 cm/s LVOT diam:     1.80 cm   LV E/e' lateral: 19.6 LV SV:         28 LV SV Index:   19 LVOT Area:     2.54 cm  RIGHT VENTRICLE RV S prime:     12.90 cm/s TAPSE (M-mode): 1.2 cm LEFT ATRIUM             Index        RIGHT ATRIUM           Index LA diam:        3.20 cm 2.16 cm/m   RA Area:     15.80 cm LA Vol (A2C):   87.5 ml 59.14 ml/m  RA Volume:   38.80 ml  26.22 ml/m LA Vol (A4C):   68.0 ml 45.96 ml/m LA Biplane Vol: 81.3 ml 54.95 ml/m  AORTIC VALVE LVOT Vmax:   75.70 cm/s LVOT Vmean:  53.800 cm/s LVOT VTI:    0.109 m  AORTA Ao Root diam: 2.80 cm MITRAL VALVE                  TRICUSPID VALVE MV Area (PHT): 5.38 cm     TR Peak grad:   47.1 mmHg MV Decel Time: 141 msec     TR Vmax:        343.00 cm/s MV E velocity: 109.00 cm/s                             SHUNTS                             Systemic VTI:  0.11 m                             Systemic Diam: 1.80 cm Christopher Schumann MD Electronically signed by Christopher Schumann MD Signature Date/Time: 01/19/2022/5:42:02 PM    Final    DG Shoulder Right  Result Date: 01/18/2022 CLINICAL DATA:  Fall EXAM: RIGHT SHOULDER - 2+ VIEW COMPARISON:  None Available. FINDINGS: No fracture or dislocation is seen. The joint spaces are preserved. The visualized soft tissues are unremarkable. Visualized right lung is clear. IMPRESSION: Negative. Electronically Signed   By: Sriyesh  Krishnan M.D.   On: 01/18/2022 17:37   DG Hip Unilat W or Wo Pelvis 2-3 Views Right  Result Date: 01/18/2022 CLINICAL DATA:  Fall, right hip pain EXAM: DG HIP (WITH OR  WITHOUT PELVIS) 2-3V RIGHT COMPARISON:  None Available. FINDINGS: Intertrochanteric right hip fracture with mild foreshortening and varus angulation. Bilateral hip joint spaces are preserved. Visualized bony pelvis appears intact. Mild degenerative changes of the lower lumbar spine. IMPRESSION: Intertrochanteric right hip fracture, as above. Electronically Signed   By: Sriyesh  Krishnan M.D.   On: 01/18/2022 17:36   CT Head Wo Contrast  Result Date: 01/18/2022 CLINICAL DATA:  Fall EXAM: CT HEAD WITHOUT CONTRAST CT CERVICAL SPINE WITHOUT CONTRAST TECHNIQUE: Multidetector CT imaging of the head and cervical spine was performed following the standard protocol without intravenous contrast. Multiplanar CT image reconstructions of the cervical spine were also generated. RADIATION DOSE REDUCTION: This exam was performed according to the departmental dose-optimization program which includes automated exposure control, adjustment of the mA and/or kV according to patient size and/or use of iterative reconstruction technique. COMPARISON:  None Available. FINDINGS: CT HEAD FINDINGS Brain: No evidence of acute infarction, hemorrhage, hydrocephalus, extra-axial collection or mass lesion/mass effect. Vascular: Intracranial atherosclerosis. Skull: Normal. Negative for fracture or focal lesion. Sinuses/Orbits: The visualized paranasal sinuses are essentially clear. The mastoid air cells are unopacified. Other: None. CT CERVICAL SPINE FINDINGS Alignment: Normal cervical lordosis. Skull base and vertebrae: No acute fracture. No primary bone lesion or focal pathologic process. Soft tissues and spinal canal: No prevertebral fluid or swelling. No visible canal hematoma. Disc levels: Intervertebral disc spaces are maintained. Spinal canal is patent. Upper chest: Visualized lung apices are clear. Other: Multiple left thyroid nodules, measuring up to 18 mm, previously evaluated on thyroid ultrasound. This has been evaluated on previous  imaging. (ref: J Am Coll Radiol. 2015 Feb;12(2): 143-50). IMPRESSION: Normal head CT. Normal cervical spine CT. Electronically Signed   By: Sriyesh  Krishnan M.D.   On: 01/18/2022 17:32   CT Cervical Spine Wo Contrast  Result Date: 01/18/2022 CLINICAL DATA:  Fall EXAM: CT HEAD WITHOUT CONTRAST CT CERVICAL SPINE WITHOUT CONTRAST TECHNIQUE: Multidetector CT imaging of the head and cervical spine was performed following the standard protocol without intravenous contrast. Multiplanar CT image reconstructions of the   cervical spine were also generated. RADIATION DOSE REDUCTION: This exam was performed according to the departmental dose-optimization program which includes automated exposure control, adjustment of the mA and/or kV according to patient size and/or use of iterative reconstruction technique. COMPARISON:  None Available. FINDINGS: CT HEAD FINDINGS Brain: No evidence of acute infarction, hemorrhage, hydrocephalus, extra-axial collection or mass lesion/mass effect. Vascular: Intracranial atherosclerosis. Skull: Normal. Negative for fracture or focal lesion. Sinuses/Orbits: The visualized paranasal sinuses are essentially clear. The mastoid air cells are unopacified. Other: None. CT CERVICAL SPINE FINDINGS Alignment: Normal cervical lordosis. Skull base and vertebrae: No acute fracture. No primary bone lesion or focal pathologic process. Soft tissues and spinal canal: No prevertebral fluid or swelling. No visible canal hematoma. Disc levels: Intervertebral disc spaces are maintained. Spinal canal is patent. Upper chest: Visualized lung apices are clear. Other: Multiple left thyroid nodules, measuring up to 18 mm, previously evaluated on thyroid ultrasound. This has been evaluated on previous imaging. (ref: J Am Coll Radiol. 2015 Feb;12(2): 143-50). IMPRESSION: Normal head CT. Normal cervical spine CT. Electronically Signed   By: Sriyesh  Krishnan M.D.   On: 01/18/2022 17:32    Review of Systems  HENT:   Negative for ear discharge, ear pain, hearing loss and tinnitus.   Eyes:  Negative for photophobia and pain.  Respiratory:  Negative for cough and shortness of breath.   Cardiovascular:  Negative for chest pain.  Gastrointestinal:  Negative for abdominal pain, nausea and vomiting.  Genitourinary:  Negative for dysuria, flank pain, frequency and urgency.  Musculoskeletal:  Positive for arthralgias (Right hip). Negative for back pain, myalgias and neck pain.  Neurological:  Negative for dizziness and headaches.  Hematological:  Does not bruise/bleed easily.  Psychiatric/Behavioral:  The patient is not nervous/anxious.    Blood pressure 108/72, pulse (!) 113, temperature 97.7 F (36.5 C), temperature source Oral, resp. rate 20, height 5' 5" (1.651 m), weight 45.8 kg, SpO2 100 %. Physical Exam Constitutional:      General: She is not in acute distress.    Appearance: She is well-developed. She is not diaphoretic.  HENT:     Head: Normocephalic and atraumatic.  Eyes:     General: No scleral icterus.       Right eye: No discharge.        Left eye: No discharge.     Conjunctiva/sclera: Conjunctivae normal.  Cardiovascular:     Rate and Rhythm: Regular rhythm. Tachycardia present.  Pulmonary:     Effort: Pulmonary effort is normal. No respiratory distress.  Musculoskeletal:     Cervical back: Normal range of motion.     Comments: RLE No traumatic wounds, ecchymosis, or rash  Mild TTP hip  No knee or ankle effusion  Knee stable to varus/ valgus and anterior/posterior stress  Sens DPN, SPN, TN intact  Motor EHL, ext, flex, evers 5/5  DP 1+, PT 0, No significant edema  Skin:    General: Skin is warm and dry.  Neurological:     Mental Status: She is alert.  Psychiatric:        Mood and Affect: Mood normal.        Behavior: Behavior normal.     Assessment/Plan: Right hip fx -- Plan IMN today with Dr. Haddix. Please keep NPO. Multiple medical problems including A-fib on Eliquis,  moderate diastolic dysfunction, severe MR, moderate to severe TR, severe pulmonary hypertension, esophageal dilations, hypertension, COPD, hyperlipidemia, LBBB, hypothyroidism, and diabetes type 2 -- per primary service and cardiology      Alp Goldwater J. Masen Luallen, PA-C Orthopedic Surgery [phone] 01/20/2022, 9:31 AM  

## 2022-01-20 NOTE — Plan of Care (Signed)

## 2022-01-20 NOTE — Transfer of Care (Signed)
Immediate Anesthesia Transfer of Care Note  Patient: [name]  Procedure(s) Performed: INTRAMEDULLARY (IM) NAIL INTERTROCHANTERIC (Right: Hip)  Patient Location: PACU  Anesthesia Type:General  Level of Consciousness: drowsy  Airway & Oxygen Therapy: Patient Spontanous Breathing and Patient connected to nasal cannula oxygen  Post-op Assessment: Report given to RN and Post -op Vital signs reviewed and stable  Post vital signs: Reviewed and stable  Last Vitals:  Vitals Value Taken Time  BP 116/45 01/20/22 1549  Temp    Pulse 55 01/20/22 1552  Resp 12 01/20/22 1552  SpO2 100 % 01/20/22 1552  Vitals shown include unvalidated device data.  Last Pain:  Vitals:   01/20/22 1333  TempSrc:   PainSc: 0-No pain         Complications: No notable events documented.

## 2022-01-21 ENCOUNTER — Encounter (HOSPITAL_COMMUNITY): Payer: Self-pay | Admitting: Student

## 2022-01-21 DIAGNOSIS — I429 Cardiomyopathy, unspecified: Secondary | ICD-10-CM | POA: Diagnosis not present

## 2022-01-21 DIAGNOSIS — I471 Supraventricular tachycardia: Secondary | ICD-10-CM | POA: Diagnosis not present

## 2022-01-21 DIAGNOSIS — W19XXXA Unspecified fall, initial encounter: Secondary | ICD-10-CM

## 2022-01-21 DIAGNOSIS — S72001A Fracture of unspecified part of neck of right femur, initial encounter for closed fracture: Secondary | ICD-10-CM | POA: Diagnosis not present

## 2022-01-21 DIAGNOSIS — I272 Pulmonary hypertension, unspecified: Secondary | ICD-10-CM | POA: Diagnosis not present

## 2022-01-21 DIAGNOSIS — I4892 Unspecified atrial flutter: Secondary | ICD-10-CM | POA: Diagnosis not present

## 2022-01-21 DIAGNOSIS — I480 Paroxysmal atrial fibrillation: Secondary | ICD-10-CM

## 2022-01-21 LAB — TYPE AND SCREEN
ABO/RH(D): B POS
Antibody Screen: NEGATIVE

## 2022-01-21 LAB — CBC
HCT: 27.9 % — ABNORMAL LOW (ref 36.0–46.0)
Hemoglobin: 8.9 g/dL — ABNORMAL LOW (ref 12.0–15.0)
MCH: 29.7 pg (ref 26.0–34.0)
MCHC: 31.9 g/dL (ref 30.0–36.0)
MCV: 93 fL (ref 80.0–100.0)
Platelets: 122 10*3/uL — ABNORMAL LOW (ref 150–400)
RBC: 3 MIL/uL — ABNORMAL LOW (ref 3.87–5.11)
RDW: 14.3 % (ref 11.5–15.5)
WBC: 9.1 10*3/uL (ref 4.0–10.5)
nRBC: 0 % (ref 0.0–0.2)

## 2022-01-21 LAB — BASIC METABOLIC PANEL
Anion gap: 9 (ref 5–15)
BUN: 18 mg/dL (ref 8–23)
CO2: 21 mmol/L — ABNORMAL LOW (ref 22–32)
Calcium: 8.4 mg/dL — ABNORMAL LOW (ref 8.9–10.3)
Chloride: 108 mmol/L (ref 98–111)
Creatinine, Ser: 1.01 mg/dL — ABNORMAL HIGH (ref 0.44–1.00)
GFR, Estimated: 52 mL/min — ABNORMAL LOW (ref >60)
Glucose, Bld: 151 mg/dL — ABNORMAL HIGH (ref 70–99)
Potassium: 4.3 mmol/L (ref 3.5–5.1)
Sodium: 138 mmol/L (ref 135–145)

## 2022-01-21 LAB — MAGNESIUM: Magnesium: 1.7 mg/dL (ref 1.7–2.4)

## 2022-01-21 MED ORDER — APIXABAN 2.5 MG PO TABS
2.5000 mg | ORAL_TABLET | Freq: Two times a day (BID) | ORAL | Status: DC
  Administered 2022-01-21 – 2022-01-26 (×11): 2.5 mg via ORAL
  Filled 2022-01-21 (×11): qty 1

## 2022-01-21 MED ORDER — APIXABAN 2.5 MG PO TABS: 2.5000 mg | ORAL_TABLET | Freq: Two times a day (BID) | ORAL | Status: DC

## 2022-01-21 MED ORDER — MAGNESIUM SULFATE 2 GM/50ML IV SOLN
2.0000 g | Freq: Once | INTRAVENOUS | Status: AC
  Administered 2022-01-21: 2 g via INTRAVENOUS
  Filled 2022-01-21: qty 50

## 2022-01-21 MED ORDER — AMIODARONE LOAD VIA INFUSION
150.0000 mg | Freq: Once | INTRAVENOUS | Status: DC
  Filled 2022-01-21: qty 83.34

## 2022-01-21 MED ORDER — METOPROLOL TARTRATE 50 MG PO TABS
100.0000 mg | ORAL_TABLET | Freq: Two times a day (BID) | ORAL | Status: AC
  Administered 2022-01-21 – 2022-01-25 (×9): 100 mg via ORAL
  Filled 2022-01-21 (×9): qty 2

## 2022-01-21 MED ORDER — VITAMIN D 25 MCG (1000 UNIT) PO TABS
1000.0000 [IU] | ORAL_TABLET | Freq: Every day | ORAL | Status: DC
  Administered 2022-01-21 – 2022-01-26 (×6): 1000 [IU] via ORAL
  Filled 2022-01-21 (×6): qty 1

## 2022-01-21 NOTE — [place] FL2 (Signed)
[place] MEDICAID FL2 LEVEL OF CARE SCREENING TOOL     IDENTIFICATION  Patient Name: [name] Birthdate: [birth year] Sex: female Admission Date (Current Location): 01/18/2022  [facility] and [place] Number:  Producer, television/film/video and Address:  [facility] [place], [address] [place], [place] [address]      Provider Number: [id number]  Attending Physician Name and Address:  Albertine Grates, MD  Relative Name and Phone Number:  [name] Daughter [phone]  [phone]    Current Level of Care: Hospital Recommended Level of Care: Skilled Nursing Facility Prior Approval Number:    Date Approved/Denied:   PASRR Number: [id number]  Discharge Plan: SNF    Current Diagnoses: Patient Active Problem List   Diagnosis Date Noted   Preop cardiovascular exam    A-fib (HCC) 01/18/2022   Closed fracture of right hip (HCC) 01/18/2022   Upper airway cough syndrome 11/19/2019   Dysphagia, oropharyngeal 04/18/2019   Elevated troponin    Hypertensive emergency    Longstanding persistent atrial fibrillation (HCC)    Severe protein-calorie malnutrition (HCC)    Dysphagia 01/31/2019   Throat tightness 12/14/2018   Throat dryness 12/14/2018   Dry mouth, unspecified 12/14/2018   Iron deficiency anemia 05/12/2017   GERD (gastroesophageal reflux disease) 03/17/2016   Constipation 10/29/2015   Gastric erosions 02/03/2015   Sinus bradycardia 09/26/2014   Dyspnea on exertion 09/18/2014   Chronic diastolic HF (heart failure) (HCC) 09/18/2014   Dizziness 09/18/2014   Atrial flutter with rapid ventricular response (HCC)    Atrial fibrillation with RVR (HCC) 09/09/2014   Atrial flutter (HCC) 07/13/2014   Hyponatremia 07/13/2014   Diabetes (HCC) 07/13/2014   Hypothyroidism 07/13/2014   Tachycardia 07/13/2014   Dysphagia, pharyngoesophageal phase 04/16/2014   Neurogenic pain of lower extremity 03/15/2012   Left bundle branch block 03/02/2011   RENAL INSUFFICIENCY  05/22/2010   Benign essential HTN 10/09/2009   Mitral regurgitation 10/09/2009   HYPERLIPIDEMIA-MIXED 02/12/2009   Palpitations 02/12/2009    Orientation RESPIRATION BLADDER Height & Weight     Self, Situation, Place  Normal Incontinent, External catheter Weight: 100 lb 15.5 oz (45.8 kg) Height:  5\' 5"  (165.1 cm)  BEHAVIORAL SYMPTOMS/MOOD NEUROLOGICAL BOWEL NUTRITION STATUS      Continent Diet (see discharge summary)  AMBULATORY STATUS COMMUNICATION OF NEEDS Skin   Limited Assist Verbally Surgical wounds                       Personal Care Assistance Level of Assistance  Bathing, Feeding, Dressing Bathing Assistance: Limited assistance Feeding assistance: Independent Dressing Assistance: Maximum assistance     Functional Limitations Info  Sight, Hearing, Speech Sight Info: Adequate Hearing Info: Impaired Speech Info: Adequate    SPECIAL CARE FACTORS FREQUENCY  PT (By licensed PT), OT (By licensed OT)     PT Frequency: 5x week OT Frequency: 5x week            Contractures Contractures Info: Not present    Additional Factors Info  Code Status, Allergies Code Status Info: DNR Allergies Info: chocolate           Current Medications (01/21/2022):  This is the current hospital active medication list Current Facility-Administered Medications  Medication Dose Route Frequency Provider Last Rate Last Admin   0.9 %  sodium chloride infusion   Intravenous Continuous 01/23/2022, PA-C 75 mL/hr at 01/20/22 1615 Rate Change at 01/20/22 1615   acetaminophen (TYLENOL) tablet 650 mg  650  mg Oral PRN [place] Bali, PA-C       albuterol (PROVENTIL) (2.5 MG/3ML) 0.083% nebulizer solution 3 mL  3 mL Inhalation Q4H PRN [place] Bali, PA-C       ALPRAZolam Prudy Feeler) tablet 0.25 mg  0.25 mg Oral QHS PRN [place] Bali, PA-C   0.25 mg at 01/19/22 2301   amiodarone (NEXTERONE PREMIX) 360-4.14 MG/200ML-% (1.8 mg/mL) IV infusion  30 mg/hr Intravenous Continuous [place], PA-C 16.67 mL/hr at 01/21/22 1057 30 mg/hr at 01/21/22 1057   apixaban (ELIQUIS) tablet 2.5 mg  2.5 mg Oral BID Reome, Earle J, RPH   2.5 mg at 01/21/22 0919   ceFAZolin (ANCEF) IVPB 2g/100 mL premix  2 g Intravenous Q8H [place], PA-C 200 mL/hr at 01/21/22 0533 2 g at 01/21/22 0533   cholecalciferol (VITAMIN D3) 25 MCG (1000 UNIT) tablet 1,000 Units  1,000 Units Oral Daily West Bali, PA-C   1,000 Units at 01/21/22 0919   docusate (COLACE) 50 MG/5ML liquid 100 mg  100 mg Oral BID [place] Bali, PA-C   100 mg at 01/21/22 1610   feeding supplement (ENSURE ENLIVE / ENSURE PLUS) liquid 237 mL  237 mL Oral BID BM [place] Bali, PA-C   237 mL at 01/21/22 0920   HYDROcodone-acetaminophen (NORCO/VICODIN) 5-325 MG per tablet 1-2 tablet  1-2 tablet Oral Q6H PRN [place] Bali, PA-C   1 tablet at 01/21/22 1141   levothyroxine (SYNTHROID) tablet 25 mcg  25 mcg Oral QAC breakfast West Bali, PA-C   25 mcg at 01/21/22 0532   methocarbamol (ROBAXIN) tablet 500 mg  500 mg Oral Q6H PRN [place] Bali, PA-C       Or   methocarbamol (ROBAXIN) 500 mg in dextrose 5 % 50 mL IVPB  500 mg Intravenous Q6H PRN Sharon Seller, Sarah A, PA-C       metoCLOPramide (REGLAN) tablet 5-10 mg  5-10 mg Oral Q8H PRN Sharon Seller, Sarah A, PA-C       Or   metoCLOPramide (REGLAN) injection 5-10 mg  5-10 mg Intravenous Q8H PRN West Bali, PA-C       metoprolol tartrate (LOPRESSOR) tablet 100 mg  100 mg Oral BID Jonita Albee, PA-C       morphine (PF) 2 MG/ML injection 0.5 mg  0.5 mg Intravenous Q2H PRN [place] Bali, PA-C   0.5 mg at 01/19/22 9604   multivitamin with minerals tablet 1 tablet  1 tablet Oral Daily West Bali, PA-C   1 tablet at 01/21/22 0919   ondansetron (ZOFRAN) tablet 4 mg  4 mg Oral Q6H PRN West Bali, PA-C       Or   ondansetron (ZOFRAN) injection 4 mg  4 mg Intravenous Q6H PRN West Bali, PA-C       pantoprazole (PROTONIX) EC tablet 40 mg  40 mg Oral  Daily Thyra Breed A, PA-C   40 mg at 01/21/22 0919   polyethylene glycol (MIRALAX / GLYCOLAX) packet 17 g  17 g Oral Daily PRN [place] Bali, PA-C         Discharge Medications: Please see discharge summary for a list of discharge medications.  Relevant Imaging Results:  Relevant Lab Results:   Additional Information SSN: [id number], pt is vaccinated for covid, possibly one booster.  Lorri Frederick, LCSW

## 2022-01-21 NOTE — Progress Notes (Signed)
Pt HR 130-145 and P -[id number] for 10 min with no c/o if pain

## 2022-01-21 NOTE — Progress Notes (Signed)
PROGRESS NOTE    [name]  AOZ:[id number] DOB: July 28, 1926 DOA: 01/18/2022 PCP: John Giovanni, MD     Brief Narrative:  88 year old female (very active at baseline still lives alone not driving but cooks 3 square meals a day takes medications out of a pouch has mainly hardness of hearing but no significant dementia) with a history of persistent atrial fibrillation/atrial flutter previously on amiodarone, hypertension, severe mitral regurgitation, moderate to severe tricuspid regurgitation, pulmonary hypertension, left bundle branch block, diabetes mellitus type 2, diet-controlled presenting with mechanical fall while doing dishes in the kitchen.  The patient denied any syncope.  She denied any prodromal symptoms including any dizziness, chest pain, shortness of breath.  She presented to the emergency department with pain on the right side.  X-ray of the right hip showed intertrochanteric right hip fracture. Patient voiced preference for being seen by Dr. Susa Simmonds and for him to perform surgery he was consulted and he recommends transferring the patient over to [facility] will place on a diet until patient can be seen-it is unclear when surgery will be performed   Subjective:  POD#1, denies pain, pleasant, aaox3 Heart rate improved , bp stable   Assessment & Plan:  Principal Problem:   A-fib (HCC) Active Problems:   Dysphagia, pharyngoesophageal phase   Diabetes (HCC)   Atrial flutter with rapid ventricular response (HCC)   GERD (gastroesophageal reflux disease)   Dysphagia, oropharyngeal   Closed fracture of right hip (HCC)   Fall   Paroxysmal SVT (supraventricular tachycardia) (HCC)     Fall his left hip intertrochanteric fracture -s/p IMN  -management per ortho  Post op anemia No overt external bleeding Monitor hgb  A-fib RVR -Was taken off amiodarone long-term due to thyroid side effect prior to admission  -cardiology consulted, started on amiodarone drip  perioperatively for heart rate control -management per  cardiology consult  Systolic CHF Appear euvolemic to dry on exam    Underweight The patient's BMI is: Body mass index is 16.8 kg/m.Marland Kitchen   Goals of care discussion Discussed with daughter, risk of CPR intubation overweigh the benefit in a frail underweight 24 year old, daughter in agreement to change code status to DNR        I have Reviewed nursing notes, Vitals, pain scores, I/o's, Lab results and  imaging results since pt's last encounter, details please see discussion above  I ordered the following labs:  Unresulted Labs (From admission, onward)     Start     Ordered   01/22/22 0500  Basic metabolic panel  Daily at 5am,   R     Question:  Specimen collection method  Answer:  Lab=Lab collect   01/21/22 1033   01/22/22 0500  Magnesium  Tomorrow morning,   R       Question:  Specimen collection method  Answer:  Lab=Lab collect   01/21/22 1927   01/21/22 0500  CBC  Daily at 5am,   R     Question:  Specimen collection method  Answer:  Lab=Lab collect   01/20/22 1736             DVT prophylaxis: apixaban (ELIQUIS) tablet 2.5 mg Start: 01/21/22 1000 SCDs Start: 01/20/22 1737 apixaban (ELIQUIS) tablet 2.5 mg   Code Status:   Code Status: DNR  Family Communication: Family at bedside on 8/30 Disposition:    Dispo: The patient is from: home, lives alone               Anticipated  d/c is to: likely will need SNF as family reports no 24/7 care at home              Anticipated d/c date is: >24hrs, needs cards clearance   Antimicrobials:    Anti-infectives (From admission, onward)    Start     Dose/Rate Route Frequency Ordered Stop   01/20/22 2200  ceFAZolin (ANCEF) IVPB 2g/100 mL premix        2 g 200 mL/hr over 30 Minutes Intravenous Every 8 hours 01/20/22 1736 01/21/22 1654   01/20/22 0600  ceFAZolin (ANCEF) IVPB 2g/100 mL premix        2 g 200 mL/hr over 30 Minutes Intravenous To Short Stay 01/19/22 2244  01/20/22 1458   01/19/22 0945  ceFAZolin (ANCEF) IVPB 2g/100 mL premix  Status:  Discontinued        2 g 200 mL/hr over 30 Minutes Intravenous On call to O.R. 01/19/22 0936 01/20/22 0559          Objective: Vitals:   01/21/22 0600 01/21/22 0755 01/21/22 0900 01/21/22 1700  BP:  128/64 (!) 141/76 134/64  Pulse: 70 64 (!) 105 72  Resp: 18  20 18   Temp:  (!) 97.5 F (36.4 C) 97.8 F (36.6 C) 98 F (36.7 C)  TempSrc:  Oral Oral Oral  SpO2: 100% 100% 100% 99%  Weight:      Height:        Intake/Output Summary (Last 24 hours) at 01/21/2022 1929 Last data filed at 01/21/2022 1624 Gross per 24 hour  Intake 2313.9 ml  Output 740 ml  Net 1573.9 ml   Filed Weights   01/18/22 1608 01/20/22 1333  Weight: 45.8 kg 45.8 kg    Examination:  General exam: aaox3, very pleasant Respiratory system: Clear to auscultation. Respiratory effort normal. Cardiovascular system:  IRRR.  Gastrointestinal system: Abdomen is nondistended, soft and nontender.  Normal bowel sounds heard. Central nervous system: Alert and oriented. No focal neurological deficits. Extremities: right hip post op changes, neurovascular intact distally Skin: No rashes, lesions or ulcers Psychiatry: Judgement and insight appear normal. Mood & affect appropriate.     Data Reviewed: I have personally reviewed  labs and visualized  imaging studies since the last encounter and formulate the plan        Scheduled Meds:  apixaban  2.5 mg Oral BID   cholecalciferol  1,000 Units Oral Daily   docusate  100 mg Oral BID   feeding supplement  237 mL Oral BID BM   levothyroxine  25 mcg Oral QAC breakfast   metoprolol tartrate  100 mg Oral BID   multivitamin with minerals  1 tablet Oral Daily   pantoprazole  40 mg Oral Daily   Continuous Infusions:  sodium chloride 75 mL/hr at 01/21/22 1615   amiodarone 30 mg/hr (01/21/22 1057)   methocarbamol (ROBAXIN) IV       LOS: 3 days     01/23/22, MD PhD FACP [facility]  Available via Epic secure chat 7am-7pm for nonurgent issues Please page for urgent issues To page the attending provider between 7A-7P or the covering provider during after hours 7P-7A, please log into the web site [email or web address] and access using universal Christopher password for that web site. If you do not have the password, please call the hospital operator.    01/21/2022, 7:29 PM

## 2022-01-21 NOTE — Plan of Care (Signed)

## 2022-01-21 NOTE — Evaluation (Signed)
Occupational Therapy Evaluation Patient Details Name: [name] MRN: [id number] DOB: 07/28/1926 Today's Date: 01/21/2022   History of Present Illness [name] is a 88 y.o. female with a hx of persistent A-fib on Eliquis, moderate diastolic dysfunction, severe MR, moderate to severe TR, severe pulmonary hypertension, esophageal dilations, hypertension COPD, hyperlipidemia, LBBB, hypothyroidism, diabetes type 2 who suffered fall at home resulting in R hip fracture. Pt is s/p cephalomedullary nailing of R intertrochanteric femur fracture.   Clinical Impression   PTA, pt lives alone, typically Independent with ADLs, household IADLs, and mobility using cane. No family present to confirm PLOF reports during session. Pt very pleasant and eager to participate. Based on PT's evaluation, trialed Stedy with pt able to correct posterior bias and stand with Mod A. When trialed again with RW, pt required Max A to stand and difficulty weight shifting. Pt requires Setup for UB ADLs and Mod-Max A for LB ADLs due to deficits listed below. As pt below baseline, lives alone and at increased risk for falls, recommend SNF rehab at [place].      Recommendations for follow up therapy are one component of a multi-disciplinary discharge planning process, led by the attending physician.  Recommendations may be updated based on patient status, additional functional criteria and insurance authorization.   Follow Up Recommendations  Skilled nursing-short term rehab (<3 hours/day)    Assistance Recommended at Discharge Frequent or constant Supervision/Assistance  Patient can return home with the following Two people to help with walking and/or transfers;A lot of help with bathing/dressing/bathroom    Functional Status Assessment  Patient has had a recent decline in their functional status and demonstrates the ability to make significant improvements in function in a reasonable and predictable amount of time.   Equipment Recommendations  BSC/3in1;Other (comment) (RW)    Recommendations for Other Services       Precautions / Restrictions Precautions Precautions: Fall;Other (comment) Precaution Comments: monitor HR Restrictions Weight Bearing Restrictions: Yes RLE Weight Bearing: Weight bearing as tolerated      Mobility Bed Mobility               General bed mobility comments: up in chair    Transfers Overall transfer level: Needs assistance Equipment used: Rolling walker (2 wheels), Ambulation equipment used Transfers: Sit to/from Stand Sit to Stand: Mod assist, Max assist           General transfer comment: Trial of Stedy for sit to stand with overall ModA and good posture. With RW, pt required Max A to stand and cues for hand placement and corrrection for posterior bias      Balance Overall balance assessment: Needs assistance Sitting-balance support: No upper extremity supported, Feet supported Sitting balance-Leahy Scale: Fair   Postural control: Posterior lean Standing balance support: Bilateral upper extremity supported, Reliant on assistive device for balance Standing balance-Leahy Scale: Poor                             ADL either performed or assessed with clinical judgement   ADL Overall ADL's : Needs assistance/impaired Eating/Feeding: Independent   Grooming: Set up;Sitting;Wash/dry face   Upper Body Bathing: Sitting;Set up   Lower Body Bathing: Sit to/from stand;Moderate assistance   Upper Body Dressing : Set up;Sitting   Lower Body Dressing: Maximal assistance;Sit to/from stand   Toilet Transfer: Maximal assistance;Stand-pivot;+2 for safety/equipment;Rolling walker (2 wheels);BSC/3in1   Toileting- Clothing Manipulation and Hygiene: Maximal assistance;Sit to/from stand  General ADL Comments: Limited by impaired standing balance and strength of operative LE     Vision Baseline Vision/History: 1 Wears glasses Ability  to See in Adequate Light: 0 Adequate Patient Visual Report: No change from baseline Vision Assessment?: No apparent visual deficits     Perception     Praxis      Pertinent Vitals/Pain Pain Assessment Pain Assessment: No/denies pain     Hand Dominance Right   Extremity/Trunk Assessment Upper Extremity Assessment Upper Extremity Assessment: RUE deficits/detail RUE Deficits / Details: limited shoulder flexion to 80*   Lower Extremity Assessment Lower Extremity Assessment: Defer to PT evaluation   Cervical / Trunk Assessment Cervical / Trunk Assessment: Kyphotic   Communication Communication Communication: HOH   Cognition Arousal/Alertness: Awake/alert Behavior During Therapy: WFL for tasks assessed/performed Overall Cognitive Status: Difficult to assess                                 General Comments: Pleasant, follows directions though limited by [facility]     General Comments  HR WFL though noted to have complications this admission with a fib and tachycardia    Exercises     Shoulder Instructions      Home Living Family/patient expects to be discharged to:: Private residence Living Arrangements: Alone Available Help at Discharge: Family;Available 24 hours/day Type of Home: House Home Access: Level entry     Home Layout: One level     Bathroom Shower/Tub: Walk-in shower         Home Equipment: Shower seat;Cane - single point;Rolling Environmental consultant (2 wheels)          Prior Functioning/Environment Prior Level of Function : Independent/Modified Independent;Patient poor historian/Family not available             Mobility Comments: Pt reports using cane at baseline. ADLs Comments: Pt's son takes care of her groceries. Pt independent with bathing and dressing. Pt independent with performing her dishes, laundry, and cooking.        OT Problem List: Decreased strength;Decreased activity tolerance;Impaired balance (sitting and/or  standing);Decreased cognition;Decreased safety awareness;Decreased knowledge of use of DME or AE;Decreased knowledge of precautions      OT Treatment/Interventions: Self-care/ADL training;Therapeutic exercise;DME and/or AE instruction;Energy conservation;Therapeutic activities;Patient/family education;Balance training    OT Goals(Current goals can be found in the care plan section) Acute Rehab OT Goals OT Goal Formulation: With patient Time For Goal Achievement: 02/03/22 Potential to Achieve Goals: Good ADL Goals Pt Will Perform Lower Body Bathing: with min assist;sit to/from stand Pt Will Perform Lower Body Dressing: with min assist;sit to/from stand Pt Will Transfer to Toilet: with min assist;ambulating Pt Will Perform Toileting - Clothing Manipulation and hygiene: with min assist;sit to/from stand  OT Frequency: Min 2X/week    Co-evaluation              AM-PAC OT "6 Clicks" Daily Activity     Outcome Measure Help from another person eating meals?: None Help from another person taking care of personal grooming?: A Little Help from another person toileting, which includes using toliet, bedpan, or urinal?: A Lot Help from another person bathing (including washing, rinsing, drying)?: A Lot Help from another person to put on and taking off regular upper body clothing?: A Little Help from another person to put on and taking off regular lower body clothing?: A Lot 6 Click Score: 16   End of Session Equipment Utilized During Treatment: Gait belt;Rolling  walker (2 wheels) Nurse Communication: Mobility status  Activity Tolerance: Patient tolerated treatment well Patient left: in chair;with call bell/phone within reach;with chair alarm set  OT Visit Diagnosis: Unsteadiness on feet (R26.81);Other abnormalities of gait and mobility (R26.89)                Time: 4081-4481 OT Time Calculation (min): 23 min Charges:  OT General Charges $OT Visit: 1 Visit OT Evaluation $OT Eval  Moderate Complexity: 1 Mod  Bradd Canary, OTR/L Acute Rehab Services Office: [phone]   Lorre Munroe 01/21/2022, 1:06 PM

## 2022-01-21 NOTE — Care Management Important Message (Signed)
Important Message  Patient Details  Name: [name] MRN: [id number] Date of Birth: May 07, 1927   Medicare Important Message Given:  Yes     Sherilyn Banker 01/21/2022, 2:10 PM

## 2022-01-21 NOTE — Progress Notes (Signed)
ANTICOAGULATION CONSULT NOTE - Initial Consult  Pharmacy Consult for apixaban restart POD#1 Indication: atrial fibrillation  Allergies  Allergen Reactions   Chocolate     Hives    Patient Measurements: Height: 5\' 5"  (165.1 cm) Weight: 45.8 kg (100 lb 15.5 oz) IBW/kg (Calculated) : 57   Vital Signs: Temp: 97.8 F (36.6 C) (08/31 0400) Temp Source: Oral (08/31 0400) BP: 123/54 (08/31 0400) Pulse Rate: 70 (08/31 0600)  Labs: Recent Labs    01/18/22 1750 01/18/22 1940 01/18/22 2120 01/19/22 0326 01/20/22 0418 01/21/22 0156  HGB  --   --   --  9.7* 9.7* 8.9*  HCT  --   --   --  29.9* 30.0* 27.9*  PLT  --   --   --  137* 114* 122*  LABPROT 17.3*  --   --   --   --   --   INR 1.4*  --   --   --   --   --   CREATININE  --   --   --  0.84 0.86 1.01*  TROPONINIHS  --  17 17  --   --   --     Estimated Creatinine Clearance: 25.2 mL/min (A) (by C-G formula based on SCr of 1.01 mg/dL (H)).   Medical History: Past Medical History:  Diagnosis Date   Anxiety    Atrial fibrillation (HCC)    COPD (chronic obstructive pulmonary disease) (HCC)    Depression    Essential hypertension    Hyperlipidemia    Hypothyroidism    Mitral regurgitation    Palpitations    PAT/EAT/NSVT, normal LVEF   Type 2 diabetes mellitus (HCC)     Medications:  Medications Prior to Admission  Medication Sig Dispense Refill Last Dose   acetaminophen (TYLENOL) 650 MG CR tablet Take 650 mg by mouth as needed for pain.   unknown   albuterol (VENTOLIN HFA) 108 (90 Base) MCG/ACT inhaler Inhale 1-2 puffs into the lungs every 4 (four) hours as needed for wheezing or shortness of breath. 8 g 0 01/18/2022   ALPRAZolam (XANAX) 0.25 MG tablet Take 0.25 mg by mouth at bedtime as needed for anxiety.   unknown   apixaban (ELIQUIS) 2.5 MG TABS tablet TAKE (1) TABLET TWICE DAILY. (Patient taking differently: Take 2.5 mg by mouth 2 (two) times daily.) 60 tablet 5 01/18/2022 at 1100   atorvastatin (LIPITOR) 40 MG  tablet Take 40 mg by mouth daily at 6 PM.   01/17/2022   bisacodyl (DULCOLAX) 5 MG EC tablet Take 5 mg by mouth daily as needed for moderate constipation.   unknown   cetirizine (ZYRTEC) 10 MG tablet Take 5-10 mg by mouth at bedtime.   01/17/2022   Cholecalciferol (VITAMIN D3) 25 MCG (1000 UT) CAPS Take by mouth.   01/18/2022   cloNIDine (CATAPRES) 0.1 MG tablet Take 0.1 mg by mouth daily. For blood pressure greater than 140/90   01/18/2022   COD LIVER OIL PO Take 15 mLs by mouth every other day.    Past Week   dextromethorphan 15 MG/5ML syrup Take 10 mLs by mouth 4 (four) times daily as needed for cough.    unknown   famotidine (PEPCID) 20 MG tablet TAKE 1 TABLET BY MOUTH EVERY DAY AFTER SUPPER. (Patient taking differently: Take 20 mg by mouth daily. TAKE 1 TABLET BY MOUTH EVERY DAY AFTER SUPPER.) 30 tablet 3 01/17/2022   furosemide (LASIX) 20 MG tablet Take 20 mg by mouth daily as  needed.   unknown   levothyroxine (SYNTHROID, LEVOTHROID) 25 MCG tablet Take 25 mcg by mouth daily before breakfast.   01/18/2022   Magnesium Hydroxide (DULCOLAX PO) Take by mouth.   unknown   metoprolol tartrate (LOPRESSOR) 50 MG tablet TAKE 1 & 1/2 TABLETS TWICE DAILY. (Patient taking differently: 50 mg.) 270 tablet 3 01/18/2022 at 1100   pantoprazole (PROTONIX) 40 MG tablet Take 1 tablet (40 mg total) by mouth daily. 28 tablet 4 01/18/2022   ACCU-CHEK AVIVA PLUS test strip 1 each daily.      EASY COMFORT LANCETS MISC TO test blood glucose ONCE daily      Lancet Devices (ADJUSTABLE LANCING DEVICE) MISC USE TO check blood glucose       Goal of Therapy:  Monitor platelets by anticoagulation protocol: Yes   Plan:  Consult received to restart PTA apixaban POD#1 if Hgb >8 and no transfusion required. HgB POD#1 is 8.9. Apixaban 2.5 mg po bid restarted. Consult discontinued but will continue to monitor and make recommendations prn.    Greta Doom BS, PharmD, BCPS Clinical Pharmacist 01/21/2022 7:55 AM  Contact:  [phone] after 3 PM  "Be curious, not judgmental..." -Debbora Dus

## 2022-01-21 NOTE — Progress Notes (Signed)
Orthopaedic Trauma Progress Note  SUBJECTIVE: Doing well this morning.  Pain much better than preoperatively.  Has not required any medications for pain postoperatively. No chest pain. No SOB. No nausea/vomiting. No other complaints.   OBJECTIVE:  Vitals:   01/21/22 0600 01/21/22 0755  BP:  128/64  Pulse: 70 64  Resp: 18   Temp:  (!) 97.5 F (36.4 C)  SpO2: 100% 100%    General: Laying in bed comfortably, no acute distress Respiratory: No increased work of breathing.  Right lower extremity: Dressings clean, dry, intact.  Mildly tender over the hip as expected.  Tolerates ankle DF/PF.  Skin warm and dry. + DP pulse  IMAGING: Stable post op imaging.   LABS:  Results for orders placed or performed during the hospital encounter of 01/18/22 (from the past 24 hour(s))  CBC     Status: Abnormal   Collection Time: 01/21/22  1:56 AM  Result Value Ref Range   WBC 9.1 4.0 - 10.5 K/uL   RBC 3.00 (L) 3.87 - 5.11 MIL/uL   Hemoglobin 8.9 (L) 12.0 - 15.0 g/dL   HCT 84.6 (L) 96.2 - 95.2 %   MCV 93.0 80.0 - 100.0 fL   MCH 29.7 26.0 - 34.0 pg   MCHC 31.9 30.0 - 36.0 g/dL   RDW 84.1 32.4 - 40.1 %   Platelets 122 (L) 150 - 400 K/uL   nRBC 0.0 0.0 - 0.2 %  Basic metabolic panel     Status: Abnormal   Collection Time: 01/21/22  1:56 AM  Result Value Ref Range   Sodium 138 135 - 145 mmol/L   Potassium 4.3 3.5 - 5.1 mmol/L   Chloride 108 98 - 111 mmol/L   CO2 21 (L) 22 - 32 mmol/L   Glucose, Bld 151 (H) 70 - 99 mg/dL   BUN 18 8 - 23 mg/dL   Creatinine, Ser 0.27 (H) 0.44 - 1.00 mg/dL   Calcium 8.4 (L) 8.9 - 10.3 mg/dL   GFR, Estimated 52 (L) >60 mL/min   Anion gap 9 5 - 15  Magnesium     Status: None   Collection Time: 01/21/22  1:56 AM  Result Value Ref Range   Magnesium 1.7 1.7 - 2.4 mg/dL    ASSESSMENT: [name] is a 88 y.o. female, 1 Day Post-Op s/p INTRAMEDULLARY NAIL RIGHT INTERTROCHANTERIC FEMUR FRACTURE   CV/Blood loss: Acute blood loss anemia, Hgb 8.9 this morning.  Hemodynamically stable  PLAN: Weightbearing: WBAT RLE ROM: Okay for unrestricted ROM hip and knee Incisional and dressing care: Reinforce dressings as needed.  Plan to remove 01/22/2022 Showering: Okay to begin getting incisions wet 01/23/2022 Orthopedic device(s): None  Pain management:  1. Tylenol 650 mg q 6 hours scheduled 2. Robaxin 500 mg q 6 hours PRN 3. Norco 5-325 mg q 6 hours PRN 4. Morphine 0.5 mg q 2 hours PRN VTE prophylaxis:  Restart Eliquis today , SCDs ID:  Ancef 2gm post op Foley/Lines:  No foley, KVO IVFs Impediments to Fracture Healing: Vitamin D level low normal at 33.  Will start on supplementation daily Dispo: PT/OT evaluation today, dispo pending.  Plan to remove right hip dressings tomorrow 01/22/2022.    D/C recommendations: -Tylenol, possible Norco for pain control -Home dose Eliquis for DVT prophylaxis -Continue 1,000 units Vit D supplementation daily  Follow - up plan:  2 weeks after discharge for wound check and repeat x-rays   Contact information:  Truitt Merle MD, Thyra Breed PA-C. After hours  and holidays please check [email or web address] for group call information for [facility]   Gwinda Passe, PA-C [phone] (office) [email or web address]

## 2022-01-21 NOTE — Evaluation (Signed)
Physical Therapy Evaluation Patient Details Name: [name] MRN: [id number] DOB: Oct 08, 1926 Today's Date: 01/21/2022  History of Present Illness  [name] is a 88 y.o. female with a hx of persistent A-fib on Eliquis, moderate diastolic dysfunction, severe MR, moderate to severe TR, severe pulmonary hypertension, esophageal dilations, hypertension COPD, hyperlipidemia, LBBB, hypothyroidism, diabetes type 2 who suffered fall at home resulting in R hip fracture. Pt is s/p cephalomedullary nailing of R intertrochanteric femur fracture.  Clinical Impression  Pt agreeable to physical therapy evaluation/treatment session. Pt completed bed to chair transfer with RW and moderate assistance. Pt limited by decreased balance, poor carryover of instructions, suspected R hip pain, and weakness. Pt currently presents with functional limitations secondary to impairments listed in PT problem list. Pt to benefit from skilled, acute care physical therapy interventions to maximize her strength, balance, functional mobility, and independence with ADL's.     Recommendations for follow up therapy are one component of a multi-disciplinary discharge planning process, led by the attending physician.  Recommendations may be updated based on patient status, additional functional criteria and insurance authorization.  Follow Up Recommendations Skilled nursing-short term rehab (<3 hours/day)      Assistance Recommended at Discharge Frequent or constant Supervision/Assistance  Patient can return home with the following  A lot of help with walking and/or transfers;A lot of help with bathing/dressing/bathroom;Assistance with cooking/housework;Assist for transportation;Help with stairs or ramp for entrance    Equipment Recommendations  (TBD as pt limited historian)  Recommendations for Other Services       Functional Status Assessment Patient has had a recent decline in their functional status and demonstrates  the ability to make significant improvements in function in a reasonable and predictable amount of time.     Precautions / Restrictions Precautions Precautions: Fall Restrictions Weight Bearing Restrictions: Yes RLE Weight Bearing: Weight bearing as tolerated      Mobility  Bed Mobility Overal bed mobility: Needs Assistance Bed Mobility: Supine to Sit     Supine to sit: Min assist     General bed mobility comments: Pt utilized HHA and bed rail for supine to sit. Pt also needing assistance for R LE management.    Transfers Overall transfer level: Needs assistance Equipment used: Rolling walker (2 wheels) Transfers: Sit to/from Stand, Bed to chair/wheelchair/BSC Sit to Stand: Min assist, Mod assist, From elevated surface   Step pivot transfers: Mod assist       General transfer comment: Pt required two attempts to complete sit to stand transfer (unable with mod assist due to weakness and retropulsion but able with min assist with bed elevated). Pt with some unsteadiness and required cues to bring R LE back to be in line with L LE once in standing. Pt with difficulty of carryover of instructions due to being [facility]. Pt performed side stepping at bedside prior to step pivot transfer.     Ambulation/Gait Ambulation/Gait assistance: Mod assist Gait Distance (Feet): 4 Feet Assistive device: Rolling walker (2 wheels) Gait Pattern/deviations: Step-to pattern, Trunk flexed, antalgic with decreased stance time on R LE Gait velocity: decreased Gait velocity interpretation: <1.31 ft/sec, indicative of household ambulator   General Gait Details: Pt with unsteadiness during step pivot transfer from bed to chair. Pt required cues for sequencing, safety awareness, and technique.  Stairs            Wheelchair Mobility    Modified Rankin (Stroke Patients Only)       Balance Overall balance assessment: Needs assistance  Sitting balance-Leahy Scale: Fair   Postural control:  Posterior lean Standing balance support: Bilateral upper extremity supported, Reliant on assistive device for balance Standing balance-Leahy Scale: Poor Standing balance comment: Required moderate assistance                             Pertinent Vitals/Pain Pain Assessment Pain Assessment: 0-10 Pain Score: 0-No pain Pain Location: R hip Pain Descriptors / Indicators:  (N/A) Pain Intervention(s): Monitored during session, Limited activity within patient's tolerance    Home Living Family/patient expects to be discharged to:: Private residence Living Arrangements: Alone Available Help at Discharge: Family;Available 24 hours/day Type of Home: House Home Access: Level entry       Home Layout: One level Home Equipment: Shower seat;Cane - single point;Rolling Walker (2 wheels)      Prior Function Prior Level of Function : Independent/Modified Independent;Patient poor historian/Family not available (Pt does not drive.)             Mobility Comments: Pt reports using cane at baseline. ADLs Comments: Pt's son takes care of her groceries. Pt independent with bathing and dressing. Pt independent with performing her dishes, laundry, and cooking.     Hand Dominance   Dominant Hand: Right    Extremity/Trunk Assessment   Upper Extremity Assessment Upper Extremity Assessment: Defer to OT evaluation    Lower Extremity Assessment Lower Extremity Assessment:  (L LE at least 3+/5 in major muscle groups and able to complete SLR, R LE 2- to 3-/5)       Communication   Communication: HOH  Cognition Arousal/Alertness: Awake/alert Behavior During Therapy: WFL for tasks assessed/performed Overall Cognitive Status: Difficult to assess                                 General Comments: Pt A and O x 4 but requires increased time to answer questions. Pt poor historian and suspect memory deficits despite hearing deficits. Pt with decreased carryover of  instructions.        General Comments General comments (skin integrity, edema, etc.): HR 71, SpO2 100%, RR 33, and BP 141/76 on RA    Exercises     Assessment/Plan    PT Assessment Patient needs continued PT services  PT Problem List Decreased strength;Decreased range of motion;Decreased activity tolerance;Decreased balance;Decreased mobility;Decreased cognition;Decreased knowledge of use of DME;Pain;Decreased safety awareness       PT Treatment Interventions DME instruction;Gait training;Functional mobility training;Therapeutic activities;Therapeutic exercise;Balance training;Neuromuscular re-education;Patient/family education;Modalities    PT Goals (Current goals can be found in the Care Plan section)  Acute Rehab PT Goals Patient Stated Goal: none stated PT Goal Formulation: With patient Time For Goal Achievement: 01/30/22 Potential to Achieve Goals:  (fairly good)    Frequency Min 3X/week     Co-evaluation               AM-PAC PT "6 Clicks" Mobility  Outcome Measure Help needed turning from your back to your side while in a flat bed without using bedrails?: A Little Help needed moving from lying on your back to sitting on the side of a flat bed without using bedrails?: A Little Help needed moving to and from a bed to a chair (including a wheelchair)?: A Lot Help needed standing up from a chair using your arms (e.g., wheelchair or bedside chair)?: A Lot Help needed to walk in hospital room?:  A Lot Help needed climbing 3-5 steps with a railing? : Total 6 Click Score: 13    End of Session Equipment Utilized During Treatment: Gait belt Activity Tolerance: Patient tolerated treatment well;Patient limited by fatigue Patient left: in chair;with call bell/phone within reach;with chair alarm set Nurse Communication: Mobility status;Precautions PT Visit Diagnosis: Unsteadiness on feet (R26.81);Other abnormalities of gait and mobility (R26.89);Muscle weakness  (generalized) (M62.81);Pain Pain - Right/Left: Right Pain - part of body: Hip    Time: 1100-1137 PT Time Calculation (min) (ACUTE ONLY): 37 min   Charges:   PT Evaluation $PT Eval Moderate Complexity: 1 Mod PT Treatments $Therapeutic Activity: 8-22 mins        Donna Bernard, PT   [facility] 01/21/2022, 11:53 AM

## 2022-01-21 NOTE — Progress Notes (Addendum)
Rounding Note    Patient Name: [name] Date of Encounter: 01/21/2022  [facility] Cardiologist: Nona Dell, MD   Subjective   Patient laying in the bed resting at the time of my evaluation. Telemetry showed HR in the 140s. Patient denies chest pain, palpitations, dizziness, sob.   Inpatient Medications    Scheduled Meds:  apixaban  2.5 mg Oral BID   cholecalciferol  1,000 Units Oral Daily   docusate  100 mg Oral BID   feeding supplement  237 mL Oral BID BM   levothyroxine  25 mcg Oral QAC breakfast   metoprolol tartrate  75 mg Oral BID   multivitamin with minerals  1 tablet Oral Daily   pantoprazole  40 mg Oral Daily   Continuous Infusions:  sodium chloride 75 mL/hr at 01/20/22 1615   amiodarone 30 mg/hr (01/20/22 2209)    ceFAZolin (ANCEF) IV 2 g (01/21/22 0533)   methocarbamol (ROBAXIN) IV     PRN Meds: acetaminophen, albuterol, ALPRAZolam, HYDROcodone-acetaminophen, methocarbamol **OR** methocarbamol (ROBAXIN) IV, metoCLOPramide **OR** metoCLOPramide (REGLAN) injection, morphine injection, ondansetron **OR** ondansetron (ZOFRAN) IV, polyethylene glycol   Vital Signs    Vitals:   01/21/22 0500 01/21/22 0600 01/21/22 0755 01/21/22 0900  BP:   128/64 (!) 141/76  Pulse: 84 70 64 (!) 105  Resp: 18 18  20   Temp:   (!) 97.5 F (36.4 C) 97.8 F (36.6 C)  TempSrc:   Oral Oral  SpO2: 100% 100% 100% 100%  Weight:      Height:        Intake/Output Summary (Last 24 hours) at 01/21/2022 0927 Last data filed at 01/21/2022 0600 Gross per 24 hour  Intake 976.25 ml  Output 275 ml  Net 701.25 ml      01/20/2022    1:33 PM 01/18/2022    4:08 PM 11/19/2021    2:07 PM  Last 3 Weights  Weight (lbs) 100 lb 15.5 oz 101 lb 99 lb 12.8 oz  Weight (kg) 45.8 kg 45.813 kg 45.269 kg      Telemetry    Atrial fibrillation, HR in the 140s-150s - Personally Reviewed  ECG     No new tracings since 8/29 - Personally Reviewed  Physical Exam   GEN: No  acute distress.  Resting comfortably in the bed in no acute distress  Neck: No JVD Cardiac: Irregular rate and rhythm, tachycardic. no murmurs, rubs, or gallops.  Respiratory: Clear to auscultation bilaterally. Normal WOB on room air  GI: Soft, nontender, non-distended  MS: No edema Neuro:  Nonfocal  Psych: Normal affect   Labs    High Sensitivity Troponin:   Recent Labs  Lab 01/18/22 1940 01/18/22 2120  TROPONINIHS 17 17     Chemistry Recent Labs  Lab 01/19/22 0326 01/20/22 0418 01/21/22 0156  NA 136 137 138  K 4.4 3.7 4.3  CL 107 109 108  CO2 22 21* 21*  GLUCOSE 115* 108* 151*  BUN 18 14 18   CREATININE 0.84 0.86 1.01*  CALCIUM 8.4* 8.1* 8.4*  MG  --   --  1.7  GFRNONAA >60 >60 52*  ANIONGAP 7 7 9     Lipids No results for input(s): "CHOL", "TRIG", "HDL", "LABVLDL", "LDLCALC", "CHOLHDL" in the last 168 hours.  Hematology Recent Labs  Lab 01/19/22 0326 01/20/22 0418 01/21/22 0156  WBC 4.4 6.0 9.1  RBC 3.25* 3.26* 3.00*  HGB 9.7* 9.7* 8.9*  HCT 29.9* 30.0* 27.9*  MCV 92.0 92.0 93.0  MCH  29.8 29.8 29.7  MCHC 32.4 32.3 31.9  RDW 14.5 14.2 14.3  PLT 137* 114* 122*   Thyroid No results for input(s): "TSH", "FREET4" in the last 168 hours.  BNPNo results for input(s): "BNP", "PROBNP" in the last 168 hours.  DDimer No results for input(s): "DDIMER" in the last 168 hours.   Radiology    DG HIP PORT UNILAT W OR W/O PELVIS 1V RIGHT  Result Date: 01/20/2022 CLINICAL DATA:  Postop right proximal femur fracture ORIF. EXAM: DG HIP (WITH OR WITHOUT PELVIS) 1V PORT RIGHT COMPARISON:  01/18/2022. FINDINGS: Right proximal femur intertrochanteric fracture has been reduced into near anatomic alignment with a short intramedullary rod and 2 screws. With peak hardware is well seated and positioned. IMPRESSION: Well aligned right proximal femur intertrochanteric fracture following ORIF. Electronically Signed   By: Lajean Manes M.D.   On: 01/20/2022 16:29   DG FEMUR, MIN 2 VIEWS  RIGHT  Result Date: 01/20/2022 CLINICAL DATA:  Intramedullary nail EXAM: RIGHT FEMUR 2 VIEWS COMPARISON:  None Available. FINDINGS: Intraoperative images demonstrate fixation of right intertrochanteric hip fracture with intramedullary nail and dynamic intertrochanteric screws. IMPRESSION: Intraoperative imaging. Electronically Signed   By: Macy Mis M.D.   On: 01/20/2022 15:36   DG C-Arm 1-60 Min-No Report  Result Date: 01/20/2022 Fluoroscopy was utilized by the requesting physician.  No radiographic interpretation.   DG CHEST PORT 1 VIEW  Result Date: 01/20/2022 CLINICAL DATA:  Shortness of breath. EXAM: PORTABLE CHEST 1 VIEW COMPARISON:  Chest x-ray dated November 19, 2019. FINDINGS: The heart size and mediastinal contours are within normal limits. The lungs remain hyperinflated. Patchy hazy opacities at both lung bases. No pneumothorax or large pleural effusion. No acute osseous abnormality. IMPRESSION: 1. Patchy hazy opacities at both lung bases could reflect pulmonary edema or pneumonia. Electronically Signed   By: Titus Dubin M.D.   On: 01/20/2022 08:55   ECHOCARDIOGRAM COMPLETE  Result Date: 01/19/2022    ECHOCARDIOGRAM REPORT   Patient Name:   [name] Date of Exam: 01/19/2022 Medical Rec #:  [id number]       Height:       65.0 in Accession #:    [id number]      Weight:       101.0 lb Date of Birth:  [birth year]       BSA:          1.480 m Patient Age:    88 years        BP:           123/92 mmHg Patient Gender: F               HR:           129 bpm. Exam Location:  [facility] Procedure: 2D Echo, Cardiac Doppler and Color Doppler Indications:    Preoperative evaluation  History:        Patient has prior history of Echocardiogram examinations, most                 recent 09/27/2019. CHF, Arrythmias:Atrial Fibrillation, LBBB and                 Atrial Flutter; Risk Factors:Hypertension, Dyslipidemia and                 Diabetes. Hx of Tachycardia & Palpitations.  Sonographer:    Alvino Chapel RCS Referring Phys: [id number] [place]  1. Technically difficult study, in Afib with RVR. Left  ventricular ejection fraction, by estimation, is 35 to 40%. The left ventricle has moderately decreased function. The left ventricle demonstrates regional wall motion abnormalities. Abnormal (paradoxical) septal motion, consistent with left bundle branch block     There is mild left ventricular hypertrophy. Left ventricular diastolic parameters are indeterminate.  2. Right ventricular systolic function is mildly reduced. The right ventricular size is normal. There is moderately elevated pulmonary artery systolic pressure. The estimated right ventricular systolic pressure is [id number] mmHg.  3. Left atrial size was severely dilated.  4. The mitral valve is normal in structure. Trivial mitral valve regurgitation.  5. Tricuspid valve regurgitation is moderate to severe.  6. The aortic valve is tricuspid. Aortic valve regurgitation is not visualized. Aortic valve sclerosis is present, with no evidence of aortic valve stenosis.  7. The inferior vena cava is normal in size with greater than 50% respiratory variability, suggesting right atrial pressure of 3 mmHg. FINDINGS  Left Ventricle: Left ventricular ejection fraction, by estimation, is 35 to 40%. The left ventricle has moderately decreased function. The left ventricle demonstrates regional wall motion abnormalities. The left ventricular internal cavity size was small. There is mild left ventricular hypertrophy. Abnormal (paradoxical) septal motion, consistent with left bundle branch block. Left ventricular diastolic parameters are indeterminate. Right Ventricle: The right ventricular size is normal. Right vetricular wall thickness was not well visualized. Right ventricular systolic function is mildly reduced. There is moderately elevated pulmonary artery systolic pressure. The tricuspid regurgitant velocity is 3.43 m/s, and with an assumed right  atrial pressure of 3 mmHg, the estimated right ventricular systolic pressure is [id number] mmHg. Left Atrium: Left atrial size was severely dilated. Right Atrium: Right atrial size was normal in size. Pericardium: Trivial pericardial effusion is present. Mitral Valve: The mitral valve is normal in structure. Trivial mitral valve regurgitation. Tricuspid Valve: The tricuspid valve is normal in structure. Tricuspid valve regurgitation is moderate to severe. Aortic Valve: The aortic valve is tricuspid. Aortic valve regurgitation is not visualized. Aortic valve sclerosis is present, with no evidence of aortic valve stenosis. Pulmonic Valve: The pulmonic valve was not well visualized. Pulmonic valve regurgitation is not visualized. Aorta: The aortic root is normal in size and structure. Venous: The inferior vena cava is normal in size with greater than 50% respiratory variability, suggesting right atrial pressure of 3 mmHg. IAS/Shunts: The interatrial septum was not well visualized.  LEFT VENTRICLE PLAX 2D LVIDd:         3.50 cm   Diastology LVIDs:         2.40 cm   LV e' medial:    5.22 cm/s LV PW:         0.90 cm   LV E/e' medial:  20.9 LV IVS:        0.90 cm   LV e' lateral:   5.55 cm/s LVOT diam:     1.80 cm   LV E/e' lateral: 19.6 LV SV:         28 LV SV Index:   19 LVOT Area:     2.54 cm  RIGHT VENTRICLE RV S prime:     12.90 cm/s TAPSE (M-mode): 1.2 cm LEFT ATRIUM             Index        RIGHT ATRIUM           Index LA diam:        3.20 cm 2.16 cm/m   RA Area:     15.80  cm LA Vol (A2C):   87.5 ml 59.14 ml/m  RA Volume:   38.80 ml  26.22 ml/m LA Vol (A4C):   68.0 ml 45.96 ml/m LA Biplane Vol: 81.3 ml 54.95 ml/m  AORTIC VALVE LVOT Vmax:   75.70 cm/s LVOT Vmean:  53.800 cm/s LVOT VTI:    0.109 m  AORTA Ao Root diam: 2.80 cm MITRAL VALVE                TRICUSPID VALVE MV Area (PHT): 5.38 cm     TR Peak grad:   47.1 mmHg MV Decel Time: 141 msec     TR Vmax:        343.00 cm/s MV E velocity: 109.00 cm/s                              SHUNTS                             Systemic VTI:  0.11 m                             Systemic Diam: 1.80 cm Epifanio Lesches MD Electronically signed by Epifanio Lesches MD Signature Date/Time: 01/19/2022/5:42:02 PM    Final     Cardiac Studies   Echocardiogram 01/19/2022 1. Technically difficult study, in Afib with RVR. Left ventricular  ejection fraction, by estimation, is 35 to 40%. The left ventricle has  moderately decreased function. The left ventricle demonstrates regional  wall motion abnormalities. Abnormal  (paradoxical) septal motion, consistent with left bundle branch block      There is mild left ventricular hypertrophy. Left ventricular diastolic  parameters are indeterminate.   2. Right ventricular systolic function is mildly reduced. The right  ventricular size is normal. There is moderately elevated pulmonary artery  systolic pressure. The estimated right ventricular systolic pressure is  50.1 mmHg.   3. Left atrial size was severely dilated.   4. The mitral valve is normal in structure. Trivial mitral valve  regurgitation.   5. Tricuspid valve regurgitation is moderate to severe.   6. The aortic valve is tricuspid. Aortic valve regurgitation is not  visualized. Aortic valve sclerosis is present, with no evidence of aortic  valve stenosis.   7. The inferior vena cava is normal in size with greater than 50%  respiratory variability, suggesting right atrial pressure of 3 mmHg.   Patient Profile     88 y.o. female with a hx of persistent A-fib on Eliquis, moderate diastolic dysfunction, severe MR, moderate to severe TR, severe pulmonary hypertension, esophageal dilations, hypertension, COPD, hyperlipidemia, LBBB, hypothyroidism, diabetes type 2 seen for preoperative evaluation 01/19/22 and then followed for atrial fibrillation with RVR  Assessment & Plan    Persistent Atrial Fibrillation  - Patient has a history of persistent atrial fibrillation. Was  taking lopressor 75 mg BID for rate control prior to admission. Previously had taken amiodarone, but it was stopped due to hypothyroidism  - Yesterday 8/30, noted her HR was in the 100s-140s. Started IV amiodarone for short term management. HR improved to the 60s  - Now, patient has been on IV amiodarone for 24 hours. HR was in the 140s-150s at the time of my evaluation. Patient had received lopressor 75 mg about 45 minutes prior. - I initially ordered an additional bolus of amiodarone, but her HR  decreased to the 60s-70s before it was given. Canceled bolus  - Question if patient had a run of SVT this AM? Will discuss with MD  - Increase lopressor to 100 mg BID  - Due to reduced EF on echo, recommend avoiding diltiazem  - PTA patient was taking eliquis 2.5 mg BID (dose reduced due to age, weight). She was transitioned to IV heparin perioperatively, but is now back on eliquis   Moderate to severe pulmonary HTN  Chronic Systolic Heart Afilure  LBBB  - Echo this admission showed LVEF 35-40% with wall motion abnormalities, abnormal septal motion consistent with LBBB, mild LVH, mildly reduced RV systolic function, moderately elevated pulmonary artery systolic pressure (RV systolic pressure A999333 mmHg), severely dilated LA, moderate to severe Tricuspid regurgitation  - Suspect reduced EF is due to LBBB, afib with RVR  - Increase metoprolol as above  - Start GDMT as BP tolerates, hold off for now as we are increasing metoprolol   Otherwise per primary  - Fall, right hip intertrochanteric fracture. Now Post op day 1 after intramedullary nail placement  - Type 2 DM        For questions or updates, please contact [place] Please consult [email or web address] for contact info under        Signed, Margie Billet, PA-C  01/21/2022, 9:27 AM

## 2022-01-21 NOTE — Significant Event (Signed)
Rapid Response Event Note   Reason for Call :  HR 150s  Initial Focused Assessment:  Patient is lying in the bed alert and oriented.  She denies any pain including no Chest Pain.  She states that she is generally a bit short of breath in the mornings but she is feeling well right now.   Over night HR 60-80s This morning since about 8am her HR is sustaining 80-110s and occasionally up to 150s. Heart tones irregular Lung sounds clear  BP 141/76  AF 80-150 RR 20  O2 sat 100% on RA  Temp 97.8  Amiodarone gtt infusing at 30mg /hr  Interventions:  AM scheduled medications given Including 75mg  Lopressor PO  Plan of Care:  RN to notify MD of Patient's HR and intervention. RN to call if patient becomes symptomatic with her AF   Event Summary:   MD Notified: RN to notify Dr Call Time: [phone] Arrival Time: 0900 End Time: 0930  Roda Shutters, RN

## 2022-01-21 NOTE — TOC Initial Note (Signed)
Transition of Care [facility]) - Initial/Assessment Note    Patient Details  Name: [name] MRN: [id number] Date of Birth: 01/18/1927  Transition [facility]) CM/SW Contact:    Lorri Frederick, LCSW Phone Number: 01/21/2022, 1:55 PM  Clinical Narrative:   Pt listed with fluctuating orientation x2-x4.  CSW spoke with daughers Dewayne Hatch and Olegario Messier in room regarding DC recommendation for SNF.  They are agreeable, choice document given, permission given to send out referral in hub.  Pt is from [place], lives alone still, no services.  First SNF choice would be [facility], then [facility].  Pt is vaccinated for covid, possibly had one booster.                Expected Discharge Plan: Skilled Nursing Facility Barriers to Discharge: Continued Medical Work up, SNF Pending bed offer   Patient Goals and CMS Choice   CMS Medicare.gov Compare Post Acute Care list provided to:: Patient Represenative (must comment) (daughters: Dewayne Hatch and [place]) Choice offered to / list presented to : Adult Children  Expected Discharge Plan and Services Expected Discharge Plan: Skilled Nursing Facility In-house Referral: Clinical Social Work   Post Acute Care Choice: Skilled Nursing Facility Living arrangements for the past 2 months: Single Family Home                                      Prior Living Arrangements/Services Living arrangements for the past 2 months: Single Family Home Lives with:: Self          Need for Family Participation in Patient Care: Yes (Comment) Care giver support system in place?: Yes (comment) Current home services: Other (comment) (none) Criminal Activity/Legal Involvement Pertinent to Current Situation/Hospitalization: No - Comment as needed  Activities of Daily Living Home Assistive Devices/Equipment: None ADL Screening (condition at time of admission) Patient's cognitive ability adequate to safely complete daily activities?: Yes Is the patient deaf or have difficulty  hearing?: Yes Does the patient have difficulty seeing, even when wearing glasses/contacts?: No Does the patient have difficulty concentrating, remembering, or making decisions?: No Patient able to express need for assistance with ADLs?: Yes Does the patient have difficulty dressing or bathing?: Yes Independently performs ADLs?: No Communication: Independent Dressing (OT): Needs assistance Is this a change from baseline?: Change from baseline, expected to last >3 days Grooming: Needs assistance Is this a change from baseline?: Change from baseline, expected to last >3 days Feeding: Independent Bathing: Needs assistance Is this a change from baseline?: Change from baseline, expected to last >3 days Toileting: Needs assistance Is this a change from baseline?: Change from baseline, expected to last >3days In/Out Bed: Needs assistance Is this a change from baseline?: Change from baseline, expected to last >3 days Walks in Home: Needs assistance Is this a change from baseline?: Change from baseline, expected to last >3 days Does the patient have difficulty walking or climbing stairs?: Yes Weakness of Legs: Right Weakness of Arms/Hands: None  Permission Sought/Granted                  Emotional Assessment Appearance:: Appears stated age Attitude/Demeanor/Rapport: Engaged Affect (typically observed): Pleasant Orientation: : Oriented to Self, Oriented to Place, Oriented to Situation Alcohol / Substance Use: Not Applicable Psych Involvement: No (comment)  Admission diagnosis:  A-fib (HCC) [I48.91] Fall, initial encounter [W19.XXXA] Closed fracture of right hip, initial encounter [facility]) [S72.001A] Patient Active Problem List  Diagnosis Date Noted   Preop cardiovascular exam    A-fib (HCC) 01/18/2022   Closed fracture of right hip (HCC) 01/18/2022   Upper airway cough syndrome 11/19/2019   Dysphagia, oropharyngeal 04/18/2019   Elevated troponin    Hypertensive emergency     Longstanding persistent atrial fibrillation (HCC)    Severe protein-calorie malnutrition (HCC)    Dysphagia 01/31/2019   Throat tightness 12/14/2018   Throat dryness 12/14/2018   Dry mouth, unspecified 12/14/2018   Iron deficiency anemia 05/12/2017   GERD (gastroesophageal reflux disease) 03/17/2016   Constipation 10/29/2015   Gastric erosions 02/03/2015   Sinus bradycardia 09/26/2014   Dyspnea on exertion 09/18/2014   Chronic diastolic HF (heart failure) (HCC) 09/18/2014   Dizziness 09/18/2014   Atrial flutter with rapid ventricular response (HCC)    Atrial fibrillation with RVR (HCC) 09/09/2014   Atrial flutter (HCC) 07/13/2014   Hyponatremia 07/13/2014   Diabetes (HCC) 07/13/2014   Hypothyroidism 07/13/2014   Tachycardia 07/13/2014   Dysphagia, pharyngoesophageal phase 04/16/2014   Neurogenic pain of lower extremity 03/15/2012   Left bundle branch block 03/02/2011   RENAL INSUFFICIENCY 05/22/2010   Benign essential HTN 10/09/2009   Mitral regurgitation 10/09/2009   HYPERLIPIDEMIA-MIXED 02/12/2009   Palpitations 02/12/2009   PCP:  John Giovanni, MD Pharmacy:   [facility] - [place], [place] - [address] [place] [address] Phone: [phone] Fax: [phone]     Social Determinants of Health (SDOH) Interventions    Readmission Risk Interventions     No data to display

## 2022-01-22 DIAGNOSIS — I471 Supraventricular tachycardia: Secondary | ICD-10-CM | POA: Diagnosis not present

## 2022-01-22 DIAGNOSIS — S72001A Fracture of unspecified part of neck of right femur, initial encounter for closed fracture: Secondary | ICD-10-CM | POA: Diagnosis not present

## 2022-01-22 DIAGNOSIS — I480 Paroxysmal atrial fibrillation: Secondary | ICD-10-CM | POA: Diagnosis not present

## 2022-01-22 LAB — CBC
HCT: 23.7 % — ABNORMAL LOW (ref 36.0–46.0)
Hemoglobin: 7.6 g/dL — ABNORMAL LOW (ref 12.0–15.0)
MCH: 29.3 pg (ref 26.0–34.0)
MCHC: 32.1 g/dL (ref 30.0–36.0)
MCV: 91.5 fL (ref 80.0–100.0)
Platelets: 128 10*3/uL — ABNORMAL LOW (ref 150–400)
RBC: 2.59 MIL/uL — ABNORMAL LOW (ref 3.87–5.11)
RDW: 14.5 % (ref 11.5–15.5)
WBC: 8.7 10*3/uL (ref 4.0–10.5)
nRBC: 0 % (ref 0.0–0.2)

## 2022-01-22 LAB — BASIC METABOLIC PANEL
Anion gap: 7 (ref 5–15)
BUN: 25 mg/dL — ABNORMAL HIGH (ref 8–23)
CO2: 20 mmol/L — ABNORMAL LOW (ref 22–32)
Calcium: 7.9 mg/dL — ABNORMAL LOW (ref 8.9–10.3)
Chloride: 108 mmol/L (ref 98–111)
Creatinine, Ser: 1.11 mg/dL — ABNORMAL HIGH (ref 0.44–1.00)
GFR, Estimated: 46 mL/min — ABNORMAL LOW (ref >60)
Glucose, Bld: 118 mg/dL — ABNORMAL HIGH (ref 70–99)
Potassium: 4 mmol/L (ref 3.5–5.1)
Sodium: 135 mmol/L (ref 135–145)

## 2022-01-22 LAB — MAGNESIUM: Magnesium: 2.4 mg/dL (ref 1.7–2.4)

## 2022-01-22 MED ORDER — HYDROCODONE-ACETAMINOPHEN 5-325 MG PO TABS: 1.0000 | ORAL_TABLET | Freq: Four times a day (QID) | ORAL | 0 refills | Status: DC | PRN

## 2022-01-22 MED ORDER — AMIODARONE HCL IN DEXTROSE 360-4.14 MG/200ML-% IV SOLN: 30.0000 mg/h | INTRAVENOUS | Status: DC

## 2022-01-22 MED ORDER — SENNOSIDES-DOCUSATE SODIUM 8.6-50 MG PO TABS
1.0000 | ORAL_TABLET | Freq: Two times a day (BID) | ORAL | Status: DC
  Administered 2022-01-22 – 2022-01-26 (×9): 1 via ORAL
  Filled 2022-01-22 (×9): qty 1

## 2022-01-22 MED ORDER — POLYETHYLENE GLYCOL 3350 17 G PO PACK
17.0000 g | PACK | Freq: Every day | ORAL | Status: DC
  Administered 2022-01-22 – 2022-01-26 (×5): 17 g via ORAL
  Filled 2022-01-22 (×5): qty 1

## 2022-01-22 NOTE — Progress Notes (Signed)
Rounding Note    Patient Name: [name] Date of Encounter: 01/22/2022  [facility] Cardiologist: Nona Dell, MD   Subjective   Doing well, no complaints this AM.  Inpatient Medications    Scheduled Meds:  apixaban  2.5 mg Oral BID   cholecalciferol  1,000 Units Oral Daily   docusate  100 mg Oral BID   feeding supplement  237 mL Oral BID BM   levothyroxine  25 mcg Oral QAC breakfast   metoprolol tartrate  100 mg Oral BID   multivitamin with minerals  1 tablet Oral Daily   pantoprazole  40 mg Oral Daily   Continuous Infusions:  sodium chloride 75 mL/hr at 01/22/22 0503   methocarbamol (ROBAXIN) IV     PRN Meds: acetaminophen, albuterol, ALPRAZolam, HYDROcodone-acetaminophen, methocarbamol **OR** methocarbamol (ROBAXIN) IV, metoCLOPramide **OR** metoCLOPramide (REGLAN) injection, morphine injection, ondansetron **OR** ondansetron (ZOFRAN) IV, polyethylene glycol   Vital Signs    Vitals:   01/21/22 2029 01/22/22 0251 01/22/22 0419 01/22/22 0924  BP: (!) 140/69 (!) 141/51  (!) 145/64  Pulse: 94  77 72  Resp: 18 18 19 18   Temp: 98.1 F (36.7 C) 97.6 F (36.4 C)  97.7 F (36.5 C)  TempSrc: Oral Oral  Oral  SpO2: 100% 95% 95% 98%  Weight:      Height:        Intake/Output Summary (Last 24 hours) at 01/22/2022 1047 Last data filed at 01/21/2022 1624 Gross per 24 hour  Intake 1943.9 ml  Output 390 ml  Net 1553.9 ml      01/20/2022    1:33 PM 01/18/2022    4:08 PM 11/19/2021    2:07 PM  Last 3 Weights  Weight (lbs) 100 lb 15.5 oz 101 lb 99 lb 12.8 oz  Weight (kg) 45.8 kg 45.813 kg 45.269 kg      Telemetry    Sinus rhythm with sinus arrhythmia and frequent PACs, with brief paroxysmal SVT - Personally Reviewed  ECG    Sinus brady with sinus arrhythmia- Personally Reviewed  Physical Exam   GEN: thin elderly woman in no acute distress NECK: No JVD CARDIAC: regular rhythm with frequent premature beats, normal S1 and S2, no rubs or  gallops. 2/6 systolic murmur. VASCULAR: Radial pulses 2+ bilaterally.  RESPIRATORY:  Clear to auscultation without rales, wheezing or rhonchi  ABDOMEN: Soft, non-tender, non-distended MUSCULOSKELETAL:  Moves all 4 limbs independently SKIN: Warm and dry, no edema NEUROLOGIC:  No focal neuro deficits noted. PSYCHIATRIC:  Normal affect    Labs    High Sensitivity Troponin:   Recent Labs  Lab 01/18/22 1940 01/18/22 2120  TROPONINIHS 17 17     Chemistry Recent Labs  Lab 01/20/22 0418 01/21/22 0156 01/22/22 0345  NA 137 138 135  K 3.7 4.3 4.0  CL 109 108 108  CO2 21* 21* 20*  GLUCOSE 108* 151* 118*  BUN 14 18 25*  CREATININE 0.86 1.01* 1.11*  CALCIUM 8.1* 8.4* 7.9*  MG  --  1.7 2.4  GFRNONAA >60 52* 46*  ANIONGAP 7 9 7     Lipids No results for input(s): "CHOL", "TRIG", "HDL", "LABVLDL", "LDLCALC", "CHOLHDL" in the last 168 hours.  Hematology Recent Labs  Lab 01/20/22 0418 01/21/22 0156 01/22/22 0345  WBC 6.0 9.1 8.7  RBC 3.26* 3.00* 2.59*  HGB 9.7* 8.9* 7.6*  HCT 30.0* 27.9* 23.7*  MCV 92.0 93.0 91.5  MCH 29.8 29.7 29.3  MCHC 32.3 31.9 32.1  RDW 14.2 14.3 14.5  PLT 114* 122* 128*   Thyroid No results for input(s): "TSH", "FREET4" in the last 168 hours.  BNPNo results for input(s): "BNP", "PROBNP" in the last 168 hours.  DDimer No results for input(s): "DDIMER" in the last 168 hours.   Radiology    DG HIP PORT UNILAT W OR W/O PELVIS 1V RIGHT  Result Date: 01/20/2022 CLINICAL DATA:  Postop right proximal femur fracture ORIF. EXAM: DG HIP (WITH OR WITHOUT PELVIS) 1V PORT RIGHT COMPARISON:  01/18/2022. FINDINGS: Right proximal femur intertrochanteric fracture has been reduced into near anatomic alignment with a short intramedullary rod and 2 screws. With peak hardware is well seated and positioned. IMPRESSION: Well aligned right proximal femur intertrochanteric fracture following ORIF. Electronically Signed   By: Amie Portland M.D.   On: 01/20/2022 16:29   DG  FEMUR, MIN 2 VIEWS RIGHT  Result Date: 01/20/2022 CLINICAL DATA:  Intramedullary nail EXAM: RIGHT FEMUR 2 VIEWS COMPARISON:  None Available. FINDINGS: Intraoperative images demonstrate fixation of right intertrochanteric hip fracture with intramedullary nail and dynamic intertrochanteric screws. IMPRESSION: Intraoperative imaging. Electronically Signed   By: Guadlupe Spanish M.D.   On: 01/20/2022 15:36   DG C-Arm 1-60 Min-No Report  Result Date: 01/20/2022 Fluoroscopy was utilized by the requesting physician.  No radiographic interpretation.    Cardiac Studies   Echo 01/19/22 1. Technically difficult study, in Afib with RVR. Left ventricular  ejection fraction, by estimation, is 35 to 40%. The left ventricle has  moderately decreased function. The left ventricle demonstrates regional  wall motion abnormalities. Abnormal  (paradoxical) septal motion, consistent with left bundle branch block      There is mild left ventricular hypertrophy. Left ventricular diastolic  parameters are indeterminate.   2. Right ventricular systolic function is mildly reduced. The right  ventricular size is normal. There is moderately elevated pulmonary artery  systolic pressure. The estimated right ventricular systolic pressure is  50.1 mmHg.   3. Left atrial size was severely dilated.   4. The mitral valve is normal in structure. Trivial mitral valve  regurgitation.   5. Tricuspid valve regurgitation is moderate to severe.   6. The aortic valve is tricuspid. Aortic valve regurgitation is not  visualized. Aortic valve sclerosis is present, with no evidence of aortic  valve stenosis.   7. The inferior vena cava is normal in size with greater than 50%  respiratory variability, suggesting right atrial pressure of 3 mmHg.   Patient Profile     88 y.o. female with a hx of persistent A-fib on Eliquis, moderate diastolic dysfunction, severe MR, moderate to severe TR, severe pulmonary hypertension, esophageal  dilations, hypertension COPD, hyperlipidemia, LBBB, hypothyroidism, diabetes type 2 seen for preoperative evaluation 01/19/22 and then followed for atrial fibrillation with RVR.  Assessment & Plan    Paroxysmal afib with RVR Paroxysmal SVT -Amiodarone IV stopped about 1 AM this morning. Has remained in sinus rhythm with PACs and intermittent SVT, asymptomatic.  -continue metoprolol tartrate 100 mg BID -If afib RVR recurs as an outpatient, would consider using amiodarone long term and supplementing her thyroid if she again becomes hypothyroid -has been restarted on reduced dose apixaban   Moderate to severe pulmonary hypertension, most recent RVSP 50 mmHg Newly reduced LVEF 35-40%, suspect 2/2 afib RVR LBBB -trivial MR on most recent study, moderate to severe TR. Appears euvolemic on exam -consider adding ARB if BP elevated as an outpatient.   Post op, intramedullary nail placement for R hip intertrochanteric fracture due to fall.  [facility] will sign off.   Medication Recommendations:   Apixaban 2.5 mg BID Atorvastatin 40 mg daily Increase metoprolol tartrate to 100 mg BID  Clonidine is only PRN, as is furosemide Other recommendations (labs, testing, etc):  none Follow up as an outpatient:  We will arrange for outpatient follow up in .   For questions or updates, please contact [facility] Please consult [email or web address] for contact info under    Signed, Jodelle Red, MD  01/22/2022, 10:47 AM

## 2022-01-22 NOTE — Progress Notes (Signed)
Physical Therapy Treatment Patient Details Name: [name] MRN: [id number] DOB: December 28, 1926 Today's Date: 01/22/2022   History of Present Illness [name] is a 88 y.o. female with a hx of persistent A-fib on Eliquis, moderate diastolic dysfunction, severe MR, moderate to severe TR, severe pulmonary hypertension, esophageal dilations, hypertension COPD, hyperlipidemia, LBBB, hypothyroidism, diabetes type 2 who suffered fall at home resulting in R hip fracture. Pt is s/p cephalomedullary nailing of R intertrochanteric femur fracture.    PT Comments    Pt instructed in and completed therapeutic exercises and activities. Pt's functional mobility limited to step pivot transfer from bed to chair with RW and minimal assistance 2/2 urinary incontinence, weakness, difficulty following motor commands, and decreased balance. Progress as tolerated.    Recommendations for follow up therapy are one component of a multi-disciplinary discharge planning process, led by the attending physician.  Recommendations may be updated based on patient status, additional functional criteria and insurance authorization.  Follow Up Recommendations  Skilled nursing-short term rehab (<3 hours/day)     Assistance Recommended at Discharge Frequent or constant Supervision/Assistance  Patient can return home with the following A lot of help with walking and/or transfers;A lot of help with bathing/dressing/bathroom;Assistance with cooking/housework;Assist for transportation;Help with stairs or ramp for entrance   Equipment Recommendations   (TBD as pt limited historian)    Recommendations for Other Services       Precautions / Restrictions Precautions Precautions: Fall Precaution Comments: monitor HR Restrictions Weight Bearing Restrictions: Yes RLE Weight Bearing: Weight bearing as tolerated     Mobility  Bed Mobility Overal bed mobility: Needs Assistance Bed Mobility: Supine to Sit     Supine to  sit: Min assist     General bed mobility comments: Pt utilized HHA and bed rail for supine to sit. Pt requires increased time to complete due to difficulty understanding instructions.    Transfers Overall transfer level: Needs assistance Equipment used: Rolling walker (2 wheels) Transfers: Sit to/from Stand, Bed to chair/wheelchair/BSC Sit to Stand: Mod assist, +2 safety/equipment   Step pivot transfers: Min assist, +2 safety/equipment       General transfer comment: Pt performed two sit to stands. Pt required cues to increase amount of knee flexion B prior to standing. Pt with urinary incontinence on first sit to stand and cleaned up. Pt then performed step pivot transfer from bed to chair (urinary incontinence present again). Pt with difficulty of carryover of instructions. Pt demonstrated unsteadiness and poor ability to keep her COG inside RW.    Ambulation/Gait Ambulation/Gait assistance: Min assist, +2 safety/equipment Gait Distance (Feet): 4 Feet Assistive device: Rolling walker (2 wheels) Gait Pattern/deviations: Step-to pattern, Trunk flexed, Drifts right/left Gait velocity: decreased Gait velocity interpretation: <1.31 ft/sec, indicative of household ambulator   General Gait Details: Pt with unsteadiness during step pivot transfer from bed to chair. Pt required cues for sequencing, safety awareness, and technique.   Stairs             Wheelchair Mobility    Modified Rankin (Stroke Patients Only)       Balance Overall balance assessment: Needs assistance   Sitting balance-Leahy Scale: Fair   Postural control: Posterior lean Standing balance support: Bilateral upper extremity supported, Reliant on assistive device for balance Standing balance-Leahy Scale: Poor                              Cognition Arousal/Alertness: Awake/alert Behavior During Therapy:  WFL for tasks assessed/performed Overall Cognitive Status: Difficult to assess                                  General Comments: Pt requires increased time to answer questions. Suspect memory deficits despite hearing deficits. Pt with decreased carryover of instructions.        Exercises General Exercises - Lower Extremity Ankle Circles/Pumps: Both, 20 reps, Supine Quad Sets: Right, 10 reps, Supine Short Arc Quad: Right, 10 reps, Supine Heel Slides: Right, 10 reps, Supine Straight Leg Raises: Left, 5 reps, Supine    General Comments General comments (skin integrity, edema, etc.): 80 HR and 15 RR on RA; HR WNL throughout activity      Pertinent Vitals/Pain Pain Assessment Pain Assessment: No/denies pain Pain Location: R hip Pain Descriptors / Indicators:  (N/A) Pain Intervention(s): Monitored during session, Limited activity within patient's tolerance    Home Living                          Prior Function            PT Goals (current goals can now be found in the care plan section) Acute Rehab PT Goals Patient Stated Goal: none stated PT Goal Formulation: With patient Time For Goal Achievement: 01/30/22 Potential to Achieve Goals:  (fairly good) Progress towards PT goals: Progressing toward goals    Frequency    Min 3X/week      PT Plan Current plan remains appropriate    Co-evaluation              AM-PAC PT "6 Clicks" Mobility   Outcome Measure  Help needed turning from your back to your side while in a flat bed without using bedrails?: A Little Help needed moving from lying on your back to sitting on the side of a flat bed without using bedrails?: A Little Help needed moving to and from a bed to a chair (including a wheelchair)?: A Lot Help needed standing up from a chair using your arms (e.g., wheelchair or bedside chair)?: A Lot Help needed to walk in hospital room?: A Lot Help needed climbing 3-5 steps with a railing? : Total 6 Click Score: 13    End of Session Equipment Utilized During Treatment: Gait  belt Activity Tolerance: Patient tolerated treatment well;Patient limited by fatigue;Other (comment) (urinary incontinence) Patient left: in chair;with call bell/phone within reach;with chair alarm set Nurse Communication: Mobility status;Precautions PT Visit Diagnosis: Unsteadiness on feet (R26.81);Other abnormalities of gait and mobility (R26.89);Muscle weakness (generalized) (M62.81);Pain Pain - Right/Left: Right Pain - part of body: Hip     Time: 9518-8416 PT Time Calculation (min) (ACUTE ONLY): 28 min  Charges:  $Therapeutic Exercise: 8-22 mins $Therapeutic Activity: 8-22 mins                     Tana Coast, PT    [employer] 01/22/2022, 12:25 PM

## 2022-01-22 NOTE — TOC Progression Note (Addendum)
Transition of Care [facility]) - Progression Note    Patient Details  Name: [name] MRN: [id number] Date of Birth: 26-Sep-1926  Transition of Care [facility] CM/SW Contact  Lorri Frederick, LCSW Phone Number: 01/22/2022, 2:14 PM  Clinical Narrative:    CSW presented bed offers to pt and daughters, they accept offer at [facility].  CSW confirmed with Christy[facility] that they can accept pt.  Auth submitted in [place] and instantly approved: [id number], 5 days: 9/1-9/5.   1430: Per Christy[facility], they cannot accept pt today but can accept tomorrow.  Will need DC summary faxed to [phone] by 11am.  Contact for tomorrow will be RN at [facility] Nurse station, [phone].  MD informed.  Pt and daughters informed.    Expected Discharge Plan: Skilled Nursing Facility Barriers to Discharge: Continued Medical Work up, SNF Pending bed offer  Expected Discharge Plan and Services Expected Discharge Plan: Skilled Nursing Facility In-house Referral: Clinical Social Work   Post Acute Care Choice: Skilled Nursing Facility Living arrangements for the past 2 months: Single Family Home                                       Social Determinants of Health (SDOH) Interventions    Readmission Risk Interventions     No data to display

## 2022-01-22 NOTE — Plan of Care (Signed)

## 2022-01-22 NOTE — Progress Notes (Signed)
Orthopaedic Trauma Progress Note  SUBJECTIVE: Doing well this morning.  Pain controlled. No chest pain. No SOB. No nausea/vomiting. No specific complaints.   OBJECTIVE:  Vitals:   01/22/22 0419 01/22/22 0924  BP:  (!) 145/64  Pulse: 77 72  Resp: 19 18  Temp:  97.7 F (36.5 C)  SpO2: 95% 98%    General: Laying in bed comfortably, no acute distress Respiratory: No increased work of breathing.  Right lower extremity: Dressings clean, dry, intact.  No significant tenderness over the hip or throughout the thigh. Tolerates ankle DF/PF.  Skin warm and dry. + DP pulse  IMAGING: Stable post op imaging.   LABS:  Results for orders placed or performed during the hospital encounter of 01/18/22 (from the past 24 hour(s))  Type and screen [facility]     Status: None   Collection Time: 01/21/22 11:45 AM  Result Value Ref Range   ABO/RH(D) B POS    Antibody Screen NEG    Sample Expiration      01/24/2022,2359 Performed at [facility], [address], [place], [place] [address]   CBC     Status: Abnormal   Collection Time: 01/22/22  3:45 AM  Result Value Ref Range   WBC 8.7 4.0 - 10.5 K/uL   RBC 2.59 (L) 3.87 - 5.11 MIL/uL   Hemoglobin 7.6 (L) 12.0 - 15.0 g/dL   HCT 75.1 (L) 70.0 - 17.4 %   MCV 91.5 80.0 - 100.0 fL   MCH 29.3 26.0 - 34.0 pg   MCHC 32.1 30.0 - 36.0 g/dL   RDW 94.4 96.7 - 59.1 %   Platelets 128 (L) 150 - 400 K/uL   nRBC 0.0 0.0 - 0.2 %  Basic metabolic panel     Status: Abnormal   Collection Time: 01/22/22  3:45 AM  Result Value Ref Range   Sodium 135 135 - 145 mmol/L   Potassium 4.0 3.5 - 5.1 mmol/L   Chloride 108 98 - 111 mmol/L   CO2 20 (L) 22 - 32 mmol/L   Glucose, Bld 118 (H) 70 - 99 mg/dL   BUN 25 (H) 8 - 23 mg/dL   Creatinine, Ser 6.38 (H) 0.44 - 1.00 mg/dL   Calcium 7.9 (L) 8.9 - 10.3 mg/dL   GFR, Estimated 46 (L) >60 mL/min   Anion gap 7 5 - 15  Magnesium     Status: None   Collection Time: 01/22/22  3:45 AM  Result Value Ref  Range   Magnesium 2.4 1.7 - 2.4 mg/dL    ASSESSMENT: [name] is a 88 y.o. female, 2 Days Post-Op s/p INTRAMEDULLARY NAIL RIGHT INTERTROCHANTERIC FEMUR FRACTURE   CV/Blood loss: Acute blood loss anemia, Hgb 7.6 this morning. Hemodynamically stable  PLAN: Weightbearing: WBAT RLE ROM: Okay for unrestricted ROM hip and knee Incisional and dressing care: Ok to leave incisions open to air Showering: Okay to begin getting incisions wet 01/23/2022 Orthopedic device(s): None  Pain management:  1. Tylenol 650 mg q 6 hours scheduled 2. Robaxin 500 mg q 6 hours PRN 3. Norco 5-325 mg q 6 hours PRN 4. Morphine 0.5 mg q 2 hours PRN VTE prophylaxis:  Eliquis , SCDs ID:  Ancef 2gm post op completed Foley/Lines:  No foley, KVO IVFs Impediments to Fracture Healing: Vitamin D level low normal at 33.  Continue supplementation Dispo: Therapies as tolerated, PT/OT recommending SNF. Continue to monitor CBC. Ok for d/c form ortho standpoint once cleared by medicine  team and therapies  D/C recommendations: -Tylenol, possible Norco for pain control -Home dose Eliquis for DVT prophylaxis -Continue 1,000 units Vit D supplementation daily  Follow - up plan:  2 weeks after discharge for wound check and repeat x-rays   Contact information:  Truitt Merle MD, Thyra Breed PA-C. After hours and holidays please check [email or web address] for group call information for [facility]   Thompson Caul, PA-C [phone] (office) [email or web address]

## 2022-01-22 NOTE — Progress Notes (Signed)
PROGRESS NOTE    [name]  WER:[id number] DOB: November 07, 1926 DOA: 01/18/2022 PCP: John Giovanni, MD     Brief Narrative:  88 year old female (very active at baseline still lives alone not driving but cooks 3 square meals a day takes medications out of a pouch has mainly hardness of hearing but no significant dementia) with a history of persistent atrial fibrillation/atrial flutter previously on amiodarone, hypertension, severe mitral regurgitation, moderate to severe tricuspid regurgitation, pulmonary hypertension, left bundle branch block, diabetes mellitus type 2, diet-controlled presenting with mechanical fall while doing dishes in the kitchen.  The patient denied any syncope.  She denied any prodromal symptoms including any dizziness, chest pain, shortness of breath.  She presented to the emergency department with pain on the right side.  X-ray of the right hip showed intertrochanteric right hip fracture. Patient voiced preference for being seen by Dr. Susa Simmonds and for him to perform surgery he was consulted and he recommends transferring the patient over to [facility] will place on a diet until patient can be seen-it is unclear when surgery will be performed   Subjective:  POD#2, denies pain, pleasant, aaox3 Heart rate improved  bp stable  Hgb dropped, no overt sign of bleeding  Cr slightly trend up to 1.11  Assessment & Plan:  Principal Problem:   A-fib (HCC) Active Problems:   Dysphagia, pharyngoesophageal phase   Diabetes (HCC)   Atrial flutter with rapid ventricular response (HCC)   GERD (gastroesophageal reflux disease)   Dysphagia, oropharyngeal   Closed fracture of right hip (HCC)   Fall   Paroxysmal SVT (supraventricular tachycardia) (HCC)     Fall his left hip intertrochanteric fracture -s/p IMN , plan per ortho -SNF  Post op anemia No overt external bleeding Monitor hgb  A-fib RVR -Was taken off amiodarone long-term due to thyroid side effect prior  to admission  -on amiodarone drip perioperatively for heart rate control, now back to NSR -now on increase betablocker, continue eliquis, cards signed off on 9/1, cards will arrange outpatient follow up  Systolic CHF Appear euvolemic to dry on exam    Underweight The patient's BMI is: Body mass index is 16.8 kg/m..       I have Reviewed nursing notes, Vitals, pain scores, I/o's, Lab results and  imaging results since pt's last encounter, details please see discussion above  I ordered the following labs:  Unresulted Labs (From admission, onward)     Start     Ordered   01/23/22 0500  Iron and TIBC  Tomorrow morning,   R       Question:  Specimen collection method  Answer:  Lab=Lab collect   01/22/22 0827   01/23/22 0500  Vitamin B12  Tomorrow morning,   R       Question:  Specimen collection method  Answer:  Lab=Lab collect   01/22/22 0827   01/23/22 0500  CBC with Differential/Platelet  Tomorrow morning,   R       Question:  Specimen collection method  Answer:  Lab=Lab collect   01/22/22 0827   01/23/22 0500  Comprehensive metabolic panel  Tomorrow morning,   R       Question:  Specimen collection method  Answer:  Lab=Lab collect   01/22/22 0827   01/23/22 0500  Reticulocytes  Tomorrow morning,   R       Question:  Specimen collection method  Answer:  Lab=Lab collect   01/22/22 0827   01/23/22 0500  Ferritin  Tomorrow morning,   R       Question:  Specimen collection method  Answer:  Lab=Lab collect   01/22/22 0827   01/22/22 0500  Basic metabolic panel  Daily at 5am,   R     Question:  Specimen collection method  Answer:  Lab=Lab collect   01/21/22 1033             DVT prophylaxis: apixaban (ELIQUIS) tablet 2.5 mg Start: 01/21/22 1000 SCDs Start: 01/20/22 1737 apixaban (ELIQUIS) tablet 2.5 mg   Code Status:   Code Status: DNR  Family Communication: Family at bedside on 8/30, 9/1 Disposition:    Dispo: The patient is from: home, lives alone                Anticipated d/c is to: SNF on 9/2 if hgb stable, per social worker need dc summary done by 11am if patient is medically stable to d/c on 9/2             Antimicrobials:    Anti-infectives (From admission, onward)    Start     Dose/Rate Route Frequency Ordered Stop   01/20/22 2200  ceFAZolin (ANCEF) IVPB 2g/100 mL premix        2 g 200 mL/hr over 30 Minutes Intravenous Every 8 hours 01/20/22 1736 01/21/22 1654   01/20/22 0600  ceFAZolin (ANCEF) IVPB 2g/100 mL premix        2 g 200 mL/hr over 30 Minutes Intravenous To Short Stay 01/19/22 2244 01/20/22 1458   01/19/22 0945  ceFAZolin (ANCEF) IVPB 2g/100 mL premix  Status:  Discontinued        2 g 200 mL/hr over 30 Minutes Intravenous On call to O.R. 01/19/22 0936 01/20/22 0559          Objective: Vitals:   01/21/22 1700 01/21/22 2029 01/22/22 0251 01/22/22 0419  BP: 134/64 (!) 140/69 (!) 141/51   Pulse: 72 94  77  Resp: 18 18 18 19   Temp: 98 F (36.7 C) 98.1 F (36.7 C) 97.6 F (36.4 C)   TempSrc: Oral Oral Oral   SpO2: 99% 100% 95% 95%  Weight:      Height:        Intake/Output Summary (Last 24 hours) at 01/22/2022 0827 Last data filed at 01/21/2022 1624 Gross per 24 hour  Intake 1943.9 ml  Output 390 ml  Net 1553.9 ml   Filed Weights   01/18/22 1608 01/20/22 1333  Weight: 45.8 kg 45.8 kg    Examination:  General exam: aaox3, very pleasant Respiratory system: Clear to auscultation. Respiratory effort normal. Cardiovascular system:  RRR.  Gastrointestinal system: Abdomen is nondistended, soft and nontender.  Normal bowel sounds heard. Central nervous system: Alert and oriented. No focal neurological deficits. Extremities: right hip post op changes, neurovascular intact distally Skin: No rashes, lesions or ulcers Psychiatry: Judgement and insight appear normal. Mood & affect appropriate.     Data Reviewed: I have personally reviewed  labs and visualized  imaging studies since the last encounter and formulate  the plan        Scheduled Meds:  apixaban  2.5 mg Oral BID   cholecalciferol  1,000 Units Oral Daily   docusate  100 mg Oral BID   feeding supplement  237 mL Oral BID BM   levothyroxine  25 mcg Oral QAC breakfast   metoprolol tartrate  100 mg Oral BID   multivitamin with minerals  1 tablet Oral Daily   pantoprazole  40  mg Oral Daily   Continuous Infusions:  sodium chloride 75 mL/hr at 01/22/22 0503   amiodarone     methocarbamol (ROBAXIN) IV       LOS: 4 days     Albertine Grates, MD PhD FACP [facility]  Available via Epic secure chat 7am-7pm for nonurgent issues Please page for urgent issues To page the attending provider between 7A-7P or the covering provider during after hours 7P-7A, please log into the web site [email or web address] and access using universal [place] password for that web site. If you do not have the password, please call the hospital operator.    01/22/2022, 8:27 AM

## 2022-01-22 NOTE — Progress Notes (Signed)
Upon rounding on pt, HR 40'S-50'S. Paged Allene Dillon NP and received new orders. Orders administered and monitoring pt.

## 2022-01-23 DIAGNOSIS — I480 Paroxysmal atrial fibrillation: Secondary | ICD-10-CM | POA: Diagnosis not present

## 2022-01-23 DIAGNOSIS — S72001A Fracture of unspecified part of neck of right femur, initial encounter for closed fracture: Secondary | ICD-10-CM | POA: Diagnosis not present

## 2022-01-23 DIAGNOSIS — I4892 Unspecified atrial flutter: Secondary | ICD-10-CM | POA: Diagnosis not present

## 2022-01-23 LAB — COMPREHENSIVE METABOLIC PANEL
ALT: 9 U/L (ref 0–44)
AST: 29 U/L (ref 15–41)
Albumin: 2.5 g/dL — ABNORMAL LOW (ref 3.5–5.0)
Alkaline Phosphatase: 49 U/L (ref 38–126)
Anion gap: 7 (ref 5–15)
BUN: 24 mg/dL — ABNORMAL HIGH (ref 8–23)
CO2: 21 mmol/L — ABNORMAL LOW (ref 22–32)
Calcium: 8.2 mg/dL — ABNORMAL LOW (ref 8.9–10.3)
Chloride: 109 mmol/L (ref 98–111)
Creatinine, Ser: 1.05 mg/dL — ABNORMAL HIGH (ref 0.44–1.00)
GFR, Estimated: 50 mL/min — ABNORMAL LOW (ref >60)
Glucose, Bld: 137 mg/dL — ABNORMAL HIGH (ref 70–99)
Potassium: 3.6 mmol/L (ref 3.5–5.1)
Sodium: 137 mmol/L (ref 135–145)
Total Bilirubin: 0.8 mg/dL (ref 0.3–1.2)
Total Protein: 5.7 g/dL — ABNORMAL LOW (ref 6.5–8.1)

## 2022-01-23 LAB — GLUCOSE, CAPILLARY
Glucose-Capillary: 20 mg/dL — CL (ref 70–99)
Glucose-Capillary: 77 mg/dL (ref 70–99)

## 2022-01-23 LAB — CBC WITH DIFFERENTIAL/PLATELET
Abs Immature Granulocytes: 0.02 10*3/uL (ref 0.00–0.07)
Basophils Absolute: 0 10*3/uL (ref 0.0–0.1)
Basophils Relative: 0 %
Eosinophils Absolute: 0 10*3/uL (ref 0.0–0.5)
Eosinophils Relative: 0 %
HCT: 25.5 % — ABNORMAL LOW (ref 36.0–46.0)
Hemoglobin: 8.3 g/dL — ABNORMAL LOW (ref 12.0–15.0)
Immature Granulocytes: 0 %
Lymphocytes Relative: 8 %
Lymphs Abs: 0.6 10*3/uL — ABNORMAL LOW (ref 0.7–4.0)
MCH: 29.5 pg (ref 26.0–34.0)
MCHC: 32.5 g/dL (ref 30.0–36.0)
MCV: 90.7 fL (ref 80.0–100.0)
Monocytes Absolute: 0.4 10*3/uL (ref 0.1–1.0)
Monocytes Relative: 5 %
Neutro Abs: 6.7 10*3/uL (ref 1.7–7.7)
Neutrophils Relative %: 87 %
Platelets: 185 10*3/uL (ref 150–400)
RBC: 2.81 MIL/uL — ABNORMAL LOW (ref 3.87–5.11)
RDW: 14.4 % (ref 11.5–15.5)
WBC: 7.7 10*3/uL (ref 4.0–10.5)
nRBC: 0 % (ref 0.0–0.2)

## 2022-01-23 LAB — IRON AND TIBC
Iron: 14 ug/dL — ABNORMAL LOW (ref 28–170)
Saturation Ratios: 6 % — ABNORMAL LOW (ref 10.4–31.8)
TIBC: 238 ug/dL — ABNORMAL LOW (ref 250–450)
UIBC: 224 ug/dL

## 2022-01-23 LAB — VITAMIN B12: Vitamin B-12: 1678 pg/mL — ABNORMAL HIGH (ref 180–914)

## 2022-01-23 LAB — FERRITIN: Ferritin: 105 ng/mL (ref 11–307)

## 2022-01-23 LAB — RETICULOCYTES
Immature Retic Fract: 23.7 % — ABNORMAL HIGH (ref 2.3–15.9)
RBC.: 2.8 MIL/uL — ABNORMAL LOW (ref 3.87–5.11)
Retic Count, Absolute: 36.7 10*3/uL (ref 19.0–186.0)
Retic Ct Pct: 1.3 % (ref 0.4–3.1)

## 2022-01-23 LAB — D-DIMER, QUANTITATIVE: D-Dimer, Quant: 1.57 ug/mL-FEU — ABNORMAL HIGH (ref 0.00–0.50)

## 2022-01-23 LAB — TSH: TSH: 2.095 u[IU]/mL (ref 0.350–4.500)

## 2022-01-23 MED ORDER — AMIODARONE HCL IN DEXTROSE 360-4.14 MG/200ML-% IV SOLN
60.0000 mg/h | INTRAVENOUS | Status: DC
  Administered 2022-01-23: 60 mg/h via INTRAVENOUS
  Filled 2022-01-23: qty 200

## 2022-01-23 MED ORDER — AMIODARONE HCL 200 MG PO TABS
400.0000 mg | ORAL_TABLET | Freq: Two times a day (BID) | ORAL | Status: DC
Start: 2022-01-23 — End: 2022-01-26
  Administered 2022-01-23 – 2022-01-26 (×6): 400 mg via ORAL
  Filled 2022-01-23 (×6): qty 2

## 2022-01-23 MED ORDER — LOSARTAN POTASSIUM 50 MG PO TABS
25.0000 mg | ORAL_TABLET | Freq: Every day | ORAL | Status: DC
  Administered 2022-01-23 – 2022-01-26 (×4): 25 mg via ORAL
  Filled 2022-01-23 (×4): qty 1

## 2022-01-23 MED ORDER — FERROUS SULFATE 325 (65 FE) MG PO TABS
325.0000 mg | ORAL_TABLET | Freq: Every day | ORAL | Status: DC
  Administered 2022-01-23 – 2022-01-26 (×4): 325 mg via ORAL
  Filled 2022-01-23 (×4): qty 1

## 2022-01-23 MED ORDER — AMIODARONE HCL 200 MG PO TABS: 200.0000 mg | ORAL_TABLET | Freq: Every day | ORAL | Status: DC

## 2022-01-23 MED ORDER — AMIODARONE HCL IN DEXTROSE 360-4.14 MG/200ML-% IV SOLN: 30.0000 mg/h | INTRAVENOUS | Status: DC

## 2022-01-23 NOTE — Plan of Care (Signed)

## 2022-01-23 NOTE — Progress Notes (Addendum)
Rounding Note    Patient Name: [name] Date of Encounter: 01/23/2022  [facility] Cardiologist: Nona Dell, MD   Subjective   Patient is sitting in chair, denied any chest pain, heart palpitation, heart racing sensation, dizziness. She states she is doing well since the surgery.   Inpatient Medications    Scheduled Meds:  apixaban  2.5 mg Oral BID   cholecalciferol  1,000 Units Oral Daily   feeding supplement  237 mL Oral BID BM   ferrous sulfate  325 mg Oral Q breakfast   levothyroxine  25 mcg Oral QAC breakfast   metoprolol tartrate  100 mg Oral BID   multivitamin with minerals  1 tablet Oral Daily   pantoprazole  40 mg Oral Daily   polyethylene glycol  17 g Oral Daily   senna-docusate  1 tablet Oral BID   Continuous Infusions:  amiodarone 60 mg/hr (01/23/22 1345)   amiodarone     methocarbamol (ROBAXIN) IV     PRN Meds: acetaminophen, albuterol, ALPRAZolam, HYDROcodone-acetaminophen, methocarbamol **OR** methocarbamol (ROBAXIN) IV, metoCLOPramide **OR** metoCLOPramide (REGLAN) injection, morphine injection, ondansetron **OR** ondansetron (ZOFRAN) IV   Vital Signs    Vitals:   01/22/22 1254 01/22/22 1941 01/23/22 0833 01/23/22 0909  BP: (!) 140/49 (!) 149/78 (!) 146/77 (!) 142/80  Pulse: (!) 57 98 98 99  Resp: 18 18 15    Temp: 98.4 F (36.9 C) 98 F (36.7 C) 97.9 F (36.6 C)   TempSrc: Oral Oral Oral   SpO2:  100% (!) 85%   Weight:      Height:       No intake or output data in the 24 hours ending 01/23/22 1358    01/20/2022    1:33 PM 01/18/2022    4:08 PM 11/19/2021    2:07 PM  Last 3 Weights  Weight (lbs) 100 lb 15.5 oz 101 lb 99 lb 12.8 oz  Weight (kg) 45.8 kg 45.813 kg 45.269 kg      Telemetry    A fib/flutter with RVR 140s  - Personally Reviewed  ECG    A fib /flutterwith RVR, 134 bpm,  old LBBB- Personally Reviewed  Physical Exam   GEN: No acute distress.   Neck: No JVD Cardiac: RRR, tachycardiac, soft murmur   Respiratory: Clear to auscultation bilaterally. On room air  GI: Soft, nontender MS: No leg edema, SCD in place  Neuro:  Nonfocal , HOH Psych: Normal affect   Labs    High Sensitivity Troponin:   Recent Labs  Lab 01/18/22 1940 01/18/22 2120  TROPONINIHS 17 17     Chemistry Recent Labs  Lab 01/21/22 0156 01/22/22 0345 01/23/22 0055  NA 138 135 137  K 4.3 4.0 3.6  CL 108 108 109  CO2 21* 20* 21*  GLUCOSE 151* 118* 137*  BUN 18 25* 24*  CREATININE 1.01* 1.11* 1.05*  CALCIUM 8.4* 7.9* 8.2*  MG 1.7 2.4  --   PROT  --   --  5.7*  ALBUMIN  --   --  2.5*  AST  --   --  29  ALT  --   --  9  ALKPHOS  --   --  49  BILITOT  --   --  0.8  GFRNONAA 52* 46* 50*  ANIONGAP 9 7 7     Lipids No results for input(s): "CHOL", "TRIG", "HDL", "LABVLDL", "LDLCALC", "CHOLHDL" in the last 168 hours.  Hematology Recent Labs  Lab 01/21/22 0156 01/22/22 0345 01/23/22  0055  WBC 9.1 8.7 7.7  RBC 3.00* 2.59* 2.81*  2.80*  HGB 8.9* 7.6* 8.3*  HCT 27.9* 23.7* 25.5*  MCV 93.0 91.5 90.7  MCH 29.7 29.3 29.5  MCHC 31.9 32.1 32.5  RDW 14.3 14.5 14.4  PLT 122* 128* 185   Thyroid No results for input(s): "TSH", "FREET4" in the last 168 hours.  BNPNo results for input(s): "BNP", "PROBNP" in the last 168 hours.  DDimer  Recent Labs  Lab 01/23/22 1235  DDIMER 1.57*     Radiology    No results found.  Cardiac Studies   Echocardiogram from 01/19/2022:  1. Technically difficult study, in Afib with RVR. Left ventricular  ejection fraction, by estimation, is 35 to 40%. The left ventricle has  moderately decreased function. The left ventricle demonstrates regional  wall motion abnormalities. Abnormal  (paradoxical) septal motion, consistent with left bundle branch block      There is mild left ventricular hypertrophy. Left ventricular diastolic  parameters are indeterminate.   2. Right ventricular systolic function is mildly reduced. The right  ventricular size is normal. There is  moderately elevated pulmonary artery  systolic pressure. The estimated right ventricular systolic pressure is  50.1 mmHg.   3. Left atrial size was severely dilated.   4. The mitral valve is normal in structure. Trivial mitral valve  regurgitation.   5. Tricuspid valve regurgitation is moderate to severe.   6. The aortic valve is tricuspid. Aortic valve regurgitation is not  visualized. Aortic valve sclerosis is present, with no evidence of aortic  valve stenosis.   7. The inferior vena cava is normal in size with greater than 50%  respiratory variability, suggesting right atrial pressure of 3 mmHg.   Patient Profile     88 y.o. female with PMH of persistent A-fib on Eliquis, diastolic dysfunction, mitral regurgitation, tricuspid regurgitation, hypertension, COPD, hyperlipidemia, LBBB, hypothyroidism, type 2 diabetes, cardiology was consulted for preop evaluation for right hip INP, postop course was complicated by A-fib with RVR, patient was given IV amiodarone, successfully converted to sinus rhythm on 01/22/2022.  She was recommended to increase metoprolol to 100 mg twice daily while continue home medication Eliquis.  She had recurrent A-fib with RVR 01/24/2019 3 AM with ventricular rate up to 140-150s, cardiology is reconsulted today.   Assessment & Plan    Persistent A-fib with RVR -History of persistent A-fib/flutter, takes Lopressor 75 mg twice daily PTA, previously stopped amiodarone due to hypothyroidism -Now with A-fib RVR postoperatively, converted to sinus rhythm with amiodarone drip on 01/22/2022, with recurrence of A-fib /flutter RVR 01/23/2022 -Likely postop/anemia mediated, will check TSH -Echo with LVEF newly decreased to 35 to 40%, suspect a component of tachyarrhythmia mediated CM - will resume IV amiodarone drip, once rate controlled, transition to p.o. amiodarone taper with 400mg  BID for 7 days and 200mg  daily after, may not need long-term - Continue anticoagulation with low dose  Eliquis (weight and age) - Continue metoprolol 100 mg twice daily - Consider optimize anemia, keep potassium >4 and Mag >2   Newly discovered systolic dysfunction Pulmonary hypertension Mitral regurgitation Tricuspid regurgitation -Clinically euvolemic, monitor for CHF symptoms -GDMT: Continue metoprolol/may transition to XL at time of discharge,will add losartan 25mg  daily, may add additional spironolactone and Farxiga if BP and renal function tolerates/patient agrees, can be done outpatient    Right hip fracture Anemia Hypertension Type 2 diabetes COPD Hypothyroidism -Managed per primary   For questions or updates, please contact [facility] Please consult  [email or web address] for contact info under        Signed, Cyndi Bender, NP  01/23/2022, 1:58 PM    Patient seen and examined with Cyndi Bender NP.  Agree as above, with the following exceptions and changes as noted below. Pt is a pleasant 88 yo female seen with her daughter at the bedside. In hospital for operative management of mechanical hip fracture now post op. Has post op anemia and also had post op Afib. Has been on amiodarone previously, then stopped due to hypothyroidism. Resumed in hospital IV amio, then she converted to SR. She now has recurrent afib RVR though asymptomatic. Gen: NAD, thin,  CV: regular, tachycardic, no murmurs, Lungs: clear, Abd: soft, Extrem: Warm, well perfused, no edema, Neuro/Psych: alert and oriented x 3, normal mood and affect. All available labs, radiology testing, previous records reviewed. Challenging to control atrial fibrillation however likely exacerbated by surgery and postoperative anemia. We can likely use oral amiodarone for a brief period of time (weeks) to control post op afib, and would arrange follow up in 2-3 weeks to assess if she is back in SR. If so, would stop amiodarone. If not, may need to consider alternate options. If patient preferred, could even consider EP consultation given  function status, as outpatient. Agree as above, can use IV amiodarone and transition to oral prior to discharge.   Parke Poisson, MD 01/23/22 2:34 PM

## 2022-01-23 NOTE — Plan of Care (Signed)
  Problem: Nutrition: Goal: Adequate nutrition will be maintained Outcome: Progressing   Problem: Coping: Goal: Level of anxiety will decrease Outcome: Progressing   Problem: Pain Managment: Goal: General experience of comfort will improve Outcome: Progressing   

## 2022-01-23 NOTE — Discharge Instructions (Signed)

## 2022-01-23 NOTE — Progress Notes (Signed)
PROGRESS NOTE    [name]  IRS:[id number] DOB: [birth year] DOA: 01/18/2022 PCP: John Giovanni, MD     Brief Narrative:  88 year old female (very active at baseline still lives alone not driving but cooks 3 square meals a day takes medications out of a pouch has mainly hardness of hearing but no significant dementia) with a history of persistent atrial fibrillation/atrial flutter previously on amiodarone, hypertension, severe mitral regurgitation, moderate to severe tricuspid regurgitation, pulmonary hypertension, left bundle branch block, diabetes mellitus type 2, diet-controlled presenting with mechanical fall while doing dishes in the kitchen.  The patient denied any syncope.  She denied any prodromal symptoms including any dizziness, chest pain, shortness of breath.  She presented to the emergency department with pain on the right side.  X-ray of the right hip showed intertrochanteric right hip fracture. Patient voiced preference for being seen by Dr. Susa Simmonds and for him to perform surgery he was consulted and he recommends transferring the patient over to [facility] will place on a diet until patient can be seen-it is unclear when surgery will be performed  01/23/2022: Patient seen alongside patient's daughter analysis.  Patient has gone back to A-fib with RVR.  Heart rate has ranged from 130 to 150 bpm.  Cardiology had patient on amiodarone until yesterday.  Will restart patient on amiodarone.  We will ask cardiology team to continue following patient.  Blood pressure stable.  Patient is also on beta-blocker.  No chest pain or shortness of breath reported.  No constitutional symptoms reported.  Patient is on Xarelto 2.5 Mg p.o. twice daily.  Serum creatinine is improving.  Serum creatinine today is 1.05.   Subjective: A-fib RVR noted. No chest pain. No shortness of breath.  Assessment & Plan:  Principal Problem:   A-fib (HCC) Active Problems:   Dysphagia, pharyngoesophageal  phase   Diabetes (HCC)   Atrial flutter with rapid ventricular response (HCC)   GERD (gastroesophageal reflux disease)   Dysphagia, oropharyngeal   Closed fracture of right hip (HCC)   Fall   Paroxysmal SVT (supraventricular tachycardia) (HCC)   Fall his left hip intertrochanteric fracture -s/p IMN , plan per ortho -SNF when stable.  Post op anemia No overt external bleeding Hemoglobin is 8.3 g/dL today. Patient is also iron deficient. We will start patient on oral iron.  A-fib RVR -Was taken off amiodarone long-term due to thyroid side effect prior to admission  -on amiodarone drip perioperatively for heart rate control, now back to NSR -now on increase betablocker, continue eliquis, cards signed off on 9/1, cards will arrange outpatient follow up 01/23/2022: Patient seen.  Rapid A-fib.  Will restart amiodarone.  We will consult cardiology team.  Patient may require oral amiodarone on discharge.  Systolic CHF Appear euvolemic to dry on exam   Underweight The patient's BMI is: Body mass index is 16.8 kg/m..   I have Reviewed nursing notes, Vitals, pain scores, I/o's, Lab results and  imaging results since pt's last encounter, details please see discussion above  I ordered the following labs:  Unresulted Labs (From admission, onward)     Start     Ordered   01/23/22 1209  D-dimer, quantitative  Once,   R        01/23/22 1208             DVT prophylaxis: apixaban (ELIQUIS) tablet 2.5 mg Start: 01/21/22 1000 SCDs Start: 01/20/22 1737 apixaban (ELIQUIS) tablet 2.5 mg   Code Status:   Code Status: DNR  Family Communication: Family at bedside on 8/30, 9/1 Disposition:    Dispo: The patient is from: home, lives alone               Anticipated d/c is to: SNF on 9/2 if hgb stable, per social worker need dc summary done by 11am if patient is medically stable to d/c on 9/2             Antimicrobials:    Anti-infectives (From admission, onward)    Start      Dose/Rate Route Frequency Ordered Stop   01/20/22 2200  ceFAZolin (ANCEF) IVPB 2g/100 mL premix        2 g 200 mL/hr over 30 Minutes Intravenous Every 8 hours 01/20/22 1736 01/21/22 1654   01/20/22 0600  ceFAZolin (ANCEF) IVPB 2g/100 mL premix        2 g 200 mL/hr over 30 Minutes Intravenous To Short Stay 01/19/22 2244 01/20/22 1458   01/19/22 0945  ceFAZolin (ANCEF) IVPB 2g/100 mL premix  Status:  Discontinued        2 g 200 mL/hr over 30 Minutes Intravenous On call to O.R. 01/19/22 0936 01/20/22 0559          Objective: Vitals:   01/22/22 1254 01/22/22 1941 01/23/22 0833 01/23/22 0909  BP: (!) 140/49 (!) 149/78 (!) 146/77 (!) 142/80  Pulse: (!) 57 98 98 99  Resp: 18 18 15    Temp: 98.4 F (36.9 C) 98 F (36.7 C) 97.9 F (36.6 C)   TempSrc: Oral Oral Oral   SpO2:  100% (!) 85%   Weight:      Height:       No intake or output data in the 24 hours ending 01/23/22 1311  Filed Weights   01/18/22 1608 01/20/22 1333  Weight: 45.8 kg 45.8 kg    Examination:  General exam: aaox3, very pleasant and nice. Respiratory system: Clear to auscultation.  Cardiovascular system: S1-S2, irregularly irregular, tachycardic.   Gastrointestinal system: Abdomen is nondistended, soft and nontender.  Normal bowel sounds heard. Central nervous system: Alert and oriented. No focal neurological deficits. Extremities: No leg edema.  Data Reviewed: I have personally reviewed  labs and visualized  imaging studies since the last encounter and formulate the plan        Scheduled Meds:  apixaban  2.5 mg Oral BID   cholecalciferol  1,000 Units Oral Daily   feeding supplement  237 mL Oral BID BM   levothyroxine  25 mcg Oral QAC breakfast   metoprolol tartrate  100 mg Oral BID   multivitamin with minerals  1 tablet Oral Daily   pantoprazole  40 mg Oral Daily   polyethylene glycol  17 g Oral Daily   senna-docusate  1 tablet Oral BID   Continuous Infusions:  amiodarone     amiodarone      methocarbamol (ROBAXIN) IV       LOS: 5 days   Time spent: 55 minutes  01/22/22, MD  [facility]  Available via Epic secure chat 7am-7pm for nonurgent issues Please page for urgent issues To page the attending provider between 7A-7P or the covering provider during after hours 7P-7A, please log into the web site [email or web address] and access using universal [place] password for that web site. If you do not have the password, please call the hospital operator.    01/23/2022, 1:11 PM

## 2022-01-24 DIAGNOSIS — I4892 Unspecified atrial flutter: Secondary | ICD-10-CM | POA: Diagnosis not present

## 2022-01-24 DIAGNOSIS — S72001A Fracture of unspecified part of neck of right femur, initial encounter for closed fracture: Secondary | ICD-10-CM | POA: Diagnosis not present

## 2022-01-24 DIAGNOSIS — I480 Paroxysmal atrial fibrillation: Secondary | ICD-10-CM | POA: Diagnosis not present

## 2022-01-24 LAB — RENAL FUNCTION PANEL
Albumin: 2.1 g/dL — ABNORMAL LOW (ref 3.5–5.0)
Anion gap: 6 (ref 5–15)
BUN: 21 mg/dL (ref 8–23)
CO2: 21 mmol/L — ABNORMAL LOW (ref 22–32)
Calcium: 7.9 mg/dL — ABNORMAL LOW (ref 8.9–10.3)
Chloride: 110 mmol/L (ref 98–111)
Creatinine, Ser: 0.86 mg/dL (ref 0.44–1.00)
GFR, Estimated: >60 mL/min (ref >60)
Glucose, Bld: 127 mg/dL — ABNORMAL HIGH (ref 70–99)
Phosphorus: 1.5 mg/dL — ABNORMAL LOW (ref 2.5–4.6)
Potassium: 3.9 mmol/L (ref 3.5–5.1)
Sodium: 137 mmol/L (ref 135–145)

## 2022-01-24 LAB — CBC WITH DIFFERENTIAL/PLATELET
Abs Immature Granulocytes: 0.02 10*3/uL (ref 0.00–0.07)
Basophils Absolute: 0 10*3/uL (ref 0.0–0.1)
Basophils Relative: 0 %
Eosinophils Absolute: 0 10*3/uL (ref 0.0–0.5)
Eosinophils Relative: 1 %
HCT: 21.3 % — ABNORMAL LOW (ref 36.0–46.0)
Hemoglobin: 7.1 g/dL — ABNORMAL LOW (ref 12.0–15.0)
Immature Granulocytes: 0 %
Lymphocytes Relative: 13 %
Lymphs Abs: 0.6 10*3/uL — ABNORMAL LOW (ref 0.7–4.0)
MCH: 29.6 pg (ref 26.0–34.0)
MCHC: 33.3 g/dL (ref 30.0–36.0)
MCV: 88.8 fL (ref 80.0–100.0)
Monocytes Absolute: 0.4 10*3/uL (ref 0.1–1.0)
Monocytes Relative: 7 %
Neutro Abs: 3.8 10*3/uL (ref 1.7–7.7)
Neutrophils Relative %: 79 %
Platelets: 155 10*3/uL (ref 150–400)
RBC: 2.4 MIL/uL — ABNORMAL LOW (ref 3.87–5.11)
RDW: 14.5 % (ref 11.5–15.5)
WBC: 4.9 10*3/uL (ref 4.0–10.5)
nRBC: 0 % (ref 0.0–0.2)

## 2022-01-24 MED ORDER — PANTOPRAZOLE 2 MG/ML SUSPENSION
40.0000 mg | Freq: Every day | ORAL | Status: DC
  Administered 2022-01-25 – 2022-01-26 (×2): 40 mg via ORAL
  Filled 2022-01-24 (×3): qty 20

## 2022-01-24 NOTE — Plan of Care (Signed)

## 2022-01-24 NOTE — Progress Notes (Signed)
Primary Cardiology:  Diona Browner   Subjective:  No cardiac complaints still in afib rates a bit high  Objective:  Vitals:   01/24/22 0740 01/24/22 0750 01/24/22 0803 01/24/22 1006  BP: 114/74   110/71  Pulse:    (!) 113  Resp: 18 14    Temp:   98.2 F (36.8 C)   TempSrc:   Oral   SpO2:      Weight:      Height:        Intake/Output from previous day:  Intake/Output Summary (Last 24 hours) at 01/24/2022 1012 Last data filed at 01/23/2022 1345 Gross per 24 hour  Intake 980.99 ml  Output --  Net 980.99 ml    Physical Exam: Cachectic black female Lungs clear SEM Abdomen benign No edema Post right hip fracture   Lab Results: Basic Metabolic Panel: Recent Labs    01/22/22 0345 01/23/22 0055 01/24/22 0048  NA 135 137 137  K 4.0 3.6 3.9  CL 108 109 110  CO2 20* 21* 21*  GLUCOSE 118* 137* 127*  BUN 25* 24* 21  CREATININE 1.11* 1.05* 0.86  CALCIUM 7.9* 8.2* 7.9*  MG 2.4  --   --   PHOS  --   --  1.5*   Liver Function Tests: Recent Labs    01/23/22 0055 01/24/22 0048  AST 29  --   ALT 9  --   ALKPHOS 49  --   BILITOT 0.8  --   PROT 5.7*  --   ALBUMIN 2.5* 2.1*   No results for input(s): "LIPASE", "AMYLASE" in the last 72 hours. CBC: Recent Labs    01/23/22 0055 01/24/22 0048  WBC 7.7 4.9  NEUTROABS 6.7 3.8  HGB 8.3* 7.1*  HCT 25.5* 21.3*  MCV 90.7 88.8  PLT 185 155    D-Dimer: Recent Labs    01/23/22 1235  DDIMER 1.57*    Thyroid Function Tests: Recent Labs    01/23/22 0055  TSH 2.095   Anemia Panel: Recent Labs    01/23/22 0055  VITAMINB12 1,678*  FERRITIN 105  TIBC 238*  IRON 14*  RETICCTPCT 1.3    Imaging: No results found.  Cardiac Studies:  ECG:    Telemetry: afib rates 90-130   Echo: 01/19/22 EF 35-40% moderate /severe TR   Medications:    [START ON 01/30/2022] amiodarone  200 mg Oral Daily   amiodarone  400 mg Oral BID   apixaban  2.5 mg Oral BID   cholecalciferol  1,000 Units Oral Daily   feeding supplement   237 mL Oral BID BM   ferrous sulfate  325 mg Oral Q breakfast   levothyroxine  25 mcg Oral QAC breakfast   losartan  25 mg Oral Daily   metoprolol tartrate  100 mg Oral BID   multivitamin with minerals  1 tablet Oral Daily   pantoprazole  40 mg Oral Daily   polyethylene glycol  17 g Oral Daily   senna-docusate  1 tablet Oral BID      methocarbamol (ROBAXIN) IV      Assessment/Plan:   PAF:  previous thyroid issues with oral amiodarone TSH normal now See note from Dr Jacques Navy will give iv amiodarone load transition to PO on d/c to SNF Add low dose beta blocker for better HR control as BP ok Continue eliquis  Consider transfusion Hct 21.3 this am  Low EF:  ? Rate related on losartan she is euvolemic to dry no need for diuretics Hip  fracture has not been up going to [facility] in [place] once afib rate control a bit better and anemia addressed   Thyroid:  TSH normal will need close f/u on oral amiodarone suggest 400 mg daily for 5 days if still in afib on d/c If she converts can d/c with 200 mg daily F/U TSH 4 weeks  Charlton Haws 01/24/2022, 10:12 AM

## 2022-01-24 NOTE — Progress Notes (Signed)
PROGRESS NOTE    [name]  NAT:[id number] DOB: [birth year] DOA: 01/18/2022 PCP: John Giovanni, MD     Brief Narrative:  88 year old female (very active at baseline still lives alone not driving but cooks 3 square meals a day takes medications out of a pouch has mainly hardness of hearing but no significant dementia) with a history of persistent atrial fibrillation/atrial flutter previously on amiodarone, hypertension, severe mitral regurgitation, moderate to severe tricuspid regurgitation, pulmonary hypertension, left bundle branch block, diabetes mellitus type 2, diet-controlled presenting with mechanical fall while doing dishes in the kitchen.  The patient denied any syncope.  She denied any prodromal symptoms including any dizziness, chest pain, shortness of breath.  She presented to the emergency department with pain on the right side.  X-ray of the right hip showed intertrochanteric right hip fracture. Patient voiced preference for being seen by Dr. Susa Simmonds and for him to perform surgery he was consulted and he recommends transferring the patient over to [facility] [facility] will place on a diet until patient can be seen-it is unclear when surgery will be performed  01/23/2022: Patient seen alongside patient's daughter analysis.  Patient has gone back to A-fib with RVR.  Heart rate has ranged from 130 to 150 bpm.  Cardiology had patient on amiodarone until yesterday.  Will restart patient on amiodarone.  We will ask cardiology team to continue following patient.  Blood pressure stable.  Patient is also on beta-blocker.  No chest pain or shortness of breath reported.  No constitutional symptoms reported.  Patient is on Xarelto 2.5 Mg p.o. twice daily.  Serum creatinine is improving.  Serum creatinine today is 1.05.   Subjective: A-fib RVR noted. No chest pain. No shortness of breath.  Assessment & Plan:  Principal Problem:   A-fib (HCC) Active Problems:   Dysphagia, pharyngoesophageal  phase   Diabetes (HCC)   Atrial flutter with rapid ventricular response (HCC)   GERD (gastroesophageal reflux disease)   Dysphagia, oropharyngeal   Closed fracture of right hip (HCC)   Fall   Paroxysmal SVT (supraventricular tachycardia) (HCC)   Fall his left hip intertrochanteric fracture -s/p IMN , plan per ortho -SNF when stable.  Post op anemia No overt external bleeding Hemoglobin is 8.3 g/dL today. Patient is also iron deficient. We will start patient on oral iron.  A-fib RVR -Was taken off amiodarone long-term due to thyroid side effect prior to admission  -on amiodarone drip perioperatively for heart rate control, now back to NSR -now on increase betablocker, continue eliquis, cards signed off on 9/1, cards will arrange outpatient follow up 01/23/2022: Patient seen.  Rapid A-fib.  Will restart amiodarone.  We will consult cardiology team.  Patient may require oral amiodarone on discharge.  Systolic CHF Appear euvolemic to dry on exam   Underweight The patient's BMI is: Body mass index is 16.8 kg/m..   I have Reviewed nursing notes, Vitals, pain scores, I/o's, Lab results and  imaging results since pt's last encounter, details please see discussion above  I ordered the following labs:  Unresulted Labs (From admission, onward)    None        DVT prophylaxis: apixaban (ELIQUIS) tablet 2.5 mg Start: 01/21/22 1000 SCDs Start: 01/20/22 1737 apixaban (ELIQUIS) tablet 2.5 mg   Code Status:   Code Status: DNR  Family Communication: Family at bedside on 8/30, 9/1 Disposition:    Dispo: The patient is from: home, lives alone  Anticipated d/c is to: SNF on 9/2 if hgb stable, per social worker need dc summary done by 11am if patient is medically stable to d/c on 9/2             Antimicrobials:    Anti-infectives (From admission, onward)    Start     Dose/Rate Route Frequency Ordered Stop   01/20/22 2200  ceFAZolin (ANCEF) IVPB 2g/100 mL premix         2 g 200 mL/hr over 30 Minutes Intravenous Every 8 hours 01/20/22 1736 01/21/22 1654   01/20/22 0600  ceFAZolin (ANCEF) IVPB 2g/100 mL premix        2 g 200 mL/hr over 30 Minutes Intravenous To Short Stay 01/19/22 2244 01/20/22 1458   01/19/22 0945  ceFAZolin (ANCEF) IVPB 2g/100 mL premix  Status:  Discontinued        2 g 200 mL/hr over 30 Minutes Intravenous On call to O.R. 01/19/22 0936 01/20/22 0559          Objective: Vitals:   01/24/22 0750 01/24/22 0803 01/24/22 0825 01/24/22 1006  BP:   (!) 120/44 110/71  Pulse:      Resp: 14  14 16   Temp:  98.2 F (36.8 C)    TempSrc:  Oral    SpO2:      Weight:      Height:       No intake or output data in the 24 hours ending 01/24/22 1659  Filed Weights   01/18/22 1608 01/20/22 1333  Weight: 45.8 kg 45.8 kg    Examination:  General exam: aaox3, very pleasant and nice. Respiratory system: Clear to auscultation.  Cardiovascular system: S1-S2, irregularly irregular.   Gastrointestinal system: Abdomen is nondistended, soft and nontender.  Normal bowel sounds heard. Central nervous system: Alert and oriented. No focal neurological deficits. Extremities: No leg edema.  Data Reviewed: I have personally reviewed  labs and visualized  imaging studies since the last encounter and formulate the plan        Scheduled Meds:  [START ON 01/30/2022] amiodarone  200 mg Oral Daily   amiodarone  400 mg Oral BID   apixaban  2.5 mg Oral BID   cholecalciferol  1,000 Units Oral Daily   feeding supplement  237 mL Oral BID BM   ferrous sulfate  325 mg Oral Q breakfast   levothyroxine  25 mcg Oral QAC breakfast   losartan  25 mg Oral Daily   metoprolol tartrate  100 mg Oral BID   multivitamin with minerals  1 tablet Oral Daily   pantoprazole  40 mg Oral Daily   polyethylene glycol  17 g Oral Daily   senna-docusate  1 tablet Oral BID   Continuous Infusions:  methocarbamol (ROBAXIN) IV       LOS: 6 days   Time spent: 35  minutes  04/01/2022, MD  [facility]  Available via Epic secure chat 7am-7pm for nonurgent issues Please page for urgent issues To page the attending provider between 7A-7P or the covering provider during after hours 7P-7A, please log into the web site [email or web address] and access using universal  password for that web site. If you do not have the password, please call the hospital operator.    01/24/2022, 4:59 PM

## 2022-01-25 DIAGNOSIS — S72001A Fracture of unspecified part of neck of right femur, initial encounter for closed fracture: Secondary | ICD-10-CM | POA: Diagnosis not present

## 2022-01-25 DIAGNOSIS — I5041 Acute combined systolic (congestive) and diastolic (congestive) heart failure: Secondary | ICD-10-CM

## 2022-01-25 DIAGNOSIS — I480 Paroxysmal atrial fibrillation: Secondary | ICD-10-CM | POA: Diagnosis not present

## 2022-01-25 MED ORDER — FUROSEMIDE 20 MG PO TABS
20.0000 mg | ORAL_TABLET | Freq: Every day | ORAL | Status: DC
  Administered 2022-01-25 – 2022-01-26 (×2): 20 mg via ORAL
  Filled 2022-01-25 (×2): qty 1

## 2022-01-25 MED ORDER — METOPROLOL SUCCINATE ER 50 MG PO TB24
100.0000 mg | ORAL_TABLET | Freq: Every day | ORAL | Status: DC
  Administered 2022-01-26: 100 mg via ORAL
  Filled 2022-01-25: qty 2

## 2022-01-25 NOTE — Progress Notes (Addendum)
Physical Therapy Treatment Patient Details Name: [name] MRN: [id number] DOB: 1927-05-08 Today's Date: 01/25/2022   History of Present Illness [name] is a 88 y.o. female with a hx of persistent A-fib on Eliquis, moderate diastolic dysfunction, severe MR, moderate to severe TR, severe pulmonary hypertension, esophageal dilations, hypertension COPD, hyperlipidemia, LBBB, hypothyroidism, diabetes type 2 who suffered fall at home resulting in R hip fracture. Pt is s/p cephalomedullary nailing of R intertrochanteric femur fracture.    PT Comments    Pt agreeable to treatment with family present. Pt with fairly good progress this session during transfer and gait training. However, pt still unsteady and has difficult time using RW correctly. Progress with current plan.  Recommendations for follow up therapy are one component of a multi-disciplinary discharge planning process, led by the attending physician.  Recommendations may be updated based on patient status, additional functional criteria and insurance authorization.  Follow Up Recommendations  Skilled nursing-short term rehab (<3 hours/day)     Assistance Recommended at Discharge Frequent or constant Supervision/Assistance  Patient can return home with the following A lot of help with walking and/or transfers;A lot of help with bathing/dressing/bathroom;Assistance with cooking/housework;Assist for transportation;Help with stairs or ramp for entrance   Equipment Recommendations   (TBD at next venue of care)    Recommendations for Other Services       Precautions / Restrictions Precautions Precautions: Fall Precaution Comments: monitor HR Restrictions Weight Bearing Restrictions: Yes RLE Weight Bearing: Weight bearing as tolerated     Mobility  Bed Mobility Overal bed mobility: Needs Assistance Bed Mobility: Supine to Sit     Supine to sit: Min assist     General bed mobility comments: Pt requires increased  time to complete due to difficulty understanding instructions. Pt able to scoot towards EOB fairly well once sitting up.    Transfers Overall transfer level: Needs assistance Equipment used: Rolling walker (2 wheels) Transfers: Sit to/from Stand, Bed to chair/wheelchair/BSC Sit to Stand: Mod assist   Step pivot transfers: Mod assist       General transfer comment: Pt performed three sit to stands this encounter (two from bed and one from chair). Pt required cues to increase amount of knee flexion on R prior to standing. Pt needed assistance to don briefs in standing prior to step pivot transfer. Pt with difficulty of carryover of instructions. Pt demonstrated unsteadiness and lack of ability to move RW with her COG as she mobilized.    Ambulation/Gait Ambulation/Gait assistance: Min assist, +2 safety/equipment Gait Distance (Feet): 19 Feet Assistive device: Rolling walker (2 wheels) Gait Pattern/deviations: Step-to pattern, Trunk flexed, Drifts right/left Gait velocity: decreased Gait velocity interpretation: <1.31 ft/sec, indicative of household ambulator Pre-gait activities: Pt performed side steps and forwards/backwards stepping in place with RW and cues prior to step pivot transfer. General Gait Details: Pt ambulated distances of 4 feet (during step pivot transfer) and 15 feet (with chair follow) respectively. Pt with improved ability moving RW with her body when ambulating in straight line vs when performing step pivot transfer.   Stairs             Wheelchair Mobility    Modified Rankin (Stroke Patients Only)       Balance Overall balance assessment: Needs assistance   Sitting balance-Leahy Scale: Fair     Standing balance support: Bilateral upper extremity supported, Reliant on assistive device for balance Standing balance-Leahy Scale: Poor  Cognition Arousal/Alertness: Awake/alert Behavior During Therapy: WFL for  tasks assessed/performed Overall Cognitive Status: Difficult to assess                                 General Comments: Pt requires increased time to answer questions. Suspect memory deficits despite hearing deficits. Pt with decreased carryover of instructions.        Exercises      General Comments General comments (skin integrity, edema, etc.): HR 64, SpO2 100% on RA, and BP 128/81. Pt's family present during session.      Pertinent Vitals/Pain Pain Assessment Pain Assessment: 0-10 Pain Score: 0-No pain Pain Location: R hip Pain Descriptors / Indicators:  (N/A) Pain Intervention(s): Monitored during session    Home Living                          Prior Function            PT Goals (current goals can now be found in the care plan section) Acute Rehab PT Goals Patient Stated Goal: none stated PT Goal Formulation: With patient Time For Goal Achievement: 01/30/22 Potential to Achieve Goals:  (fairly good) Progress towards PT goals: Progressing toward goals    Frequency    Min 3X/week      PT Plan Current plan remains appropriate    Co-evaluation              AM-PAC PT "6 Clicks" Mobility   Outcome Measure  Help needed turning from your back to your side while in a flat bed without using bedrails?: A Little Help needed moving from lying on your back to sitting on the side of a flat bed without using bedrails?: A Little Help needed moving to and from a bed to a chair (including a wheelchair)?: A Lot Help needed standing up from a chair using your arms (e.g., wheelchair or bedside chair)?: A Lot Help needed to walk in hospital room?: A Lot Help needed climbing 3-5 steps with a railing? : A Lot 6 Click Score: 14    End of Session Equipment Utilized During Treatment: Gait belt Activity Tolerance: Patient tolerated treatment well;Patient limited by fatigue;No increased pain Patient left: in chair;with call bell/phone within  reach;with chair alarm set;with family/visitor present Nurse Communication: Mobility status;Need for lift equipment PT Visit Diagnosis: Unsteadiness on feet (R26.81);Other abnormalities of gait and mobility (R26.89);Muscle weakness (generalized) (M62.81);Pain Pain - Right/Left: Right Pain - part of body: Hip     Time: 2536-6440 PT Time Calculation (min) (ACUTE ONLY): 27 min  Charges:  $Gait Training: 8-22 mins $Therapeutic Activity: 8-22 mins                     Tana Coast, PT    [employer] 01/25/2022, 10:30 AM

## 2022-01-25 NOTE — Progress Notes (Addendum)
Rounding Note    Patient Name: [name] Date of Encounter: 01/25/2022  [facility] Cardiologist: Nona Dell, MD   Subjective   Hgb 7.1 this am.  Cr stable at 0.86.  Reports some dyspnea  Inpatient Medications    Scheduled Meds:  [START ON 01/30/2022] amiodarone  200 mg Oral Daily   amiodarone  400 mg Oral BID   apixaban  2.5 mg Oral BID   cholecalciferol  1,000 Units Oral Daily   feeding supplement  237 mL Oral BID BM   ferrous sulfate  325 mg Oral Q breakfast   levothyroxine  25 mcg Oral QAC breakfast   losartan  25 mg Oral Daily   metoprolol tartrate  100 mg Oral BID   multivitamin with minerals  1 tablet Oral Daily   pantoprazole  40 mg Oral Daily   polyethylene glycol  17 g Oral Daily   senna-docusate  1 tablet Oral BID   Continuous Infusions:  methocarbamol (ROBAXIN) IV     PRN Meds: acetaminophen, albuterol, ALPRAZolam, HYDROcodone-acetaminophen, methocarbamol **OR** methocarbamol (ROBAXIN) IV, metoCLOPramide **OR** metoCLOPramide (REGLAN) injection, morphine injection, ondansetron **OR** ondansetron (ZOFRAN) IV   Vital Signs    Vitals:   01/24/22 1845 01/24/22 2032 01/25/22 0600 01/25/22 0827  BP:  (!) 148/53 (!) 149/62 (!) 153/67  Pulse:  71 65 73  Resp:  19 19   Temp:  98 F (36.7 C) 98.2 F (36.8 C)   TempSrc: Oral Oral Oral   SpO2:  100% 100%   Weight:      Height:        Intake/Output Summary (Last 24 hours) at 01/25/2022 1055 Last data filed at 01/25/2022 0900 Gross per 24 hour  Intake 50 ml  Output --  Net 50 ml      01/20/2022    1:33 PM 01/18/2022    4:08 PM 11/19/2021    2:07 PM  Last 3 Weights  Weight (lbs) 100 lb 15.5 oz 101 lb 99 lb 12.8 oz  Weight (kg) 45.8 kg 45.813 kg 45.269 kg      Telemetry    Sinus bradycardia with PACs  - Personally Reviewed  ECG    No new ECG- Personally Reviewed  Physical Exam   GEN: No acute distress.   Neck: +JVD Cardiac: bradycardic, irregular soft murmur  Respiratory:  Clear to auscultation bilaterally. On room air  GI: Soft, nontender MS: Trace edema Neuro:  Nonfocal , HOH Psych: Normal affect   Labs    High Sensitivity Troponin:   Recent Labs  Lab 01/18/22 1940 01/18/22 2120  TROPONINIHS 17 17      Chemistry Recent Labs  Lab 01/21/22 0156 01/22/22 0345 01/23/22 0055 01/24/22 0048  NA 138 135 137 137  K 4.3 4.0 3.6 3.9  CL 108 108 109 110  CO2 21* 20* 21* 21*  GLUCOSE 151* 118* 137* 127*  BUN 18 25* 24* 21  CREATININE 1.01* 1.11* 1.05* 0.86  CALCIUM 8.4* 7.9* 8.2* 7.9*  MG 1.7 2.4  --   --   PROT  --   --  5.7*  --   ALBUMIN  --   --  2.5* 2.1*  AST  --   --  29  --   ALT  --   --  9  --   ALKPHOS  --   --  49  --   BILITOT  --   --  0.8  --   GFRNONAA 52* 46* 50* >60  ANIONGAP 9 7 7 6      Lipids No results for input(s): "CHOL", "TRIG", "HDL", "LABVLDL", "LDLCALC", "CHOLHDL" in the last 168 hours.  Hematology Recent Labs  Lab 01/22/22 0345 01/23/22 0055 01/24/22 0048  WBC 8.7 7.7 4.9  RBC 2.59* 2.81*  2.80* 2.40*  HGB 7.6* 8.3* 7.1*  HCT 23.7* 25.5* 21.3*  MCV 91.5 90.7 88.8  MCH 29.3 29.5 29.6  MCHC 32.1 32.5 33.3  RDW 14.5 14.4 14.5  PLT 128* 185 155    Thyroid  Recent Labs  Lab 01/23/22 0055  TSH 2.095    BNPNo results for input(s): "BNP", "PROBNP" in the last 168 hours.  DDimer  Recent Labs  Lab 01/23/22 1235  DDIMER 1.57*      Radiology    No results found.  Cardiac Studies   Echocardiogram from 01/19/2022:  1. Technically difficult study, in Afib with RVR. Left ventricular  ejection fraction, by estimation, is 35 to 40%. The left ventricle has  moderately decreased function. The left ventricle demonstrates regional  wall motion abnormalities. Abnormal  (paradoxical) septal motion, consistent with left bundle branch block      There is mild left ventricular hypertrophy. Left ventricular diastolic  parameters are indeterminate.   2. Right ventricular systolic function is mildly reduced.  The right  ventricular size is normal. There is moderately elevated pulmonary artery  systolic pressure. The estimated right ventricular systolic pressure is  50.1 mmHg.   3. Left atrial size was severely dilated.   4. The mitral valve is normal in structure. Trivial mitral valve  regurgitation.   5. Tricuspid valve regurgitation is moderate to severe.   6. The aortic valve is tricuspid. Aortic valve regurgitation is not  visualized. Aortic valve sclerosis is present, with no evidence of aortic  valve stenosis.   7. The inferior vena cava is normal in size with greater than 50%  respiratory variability, suggesting right atrial pressure of 3 mmHg.   Patient Profile     88 y.o. female with PMH of persistent A-fib on Eliquis, diastolic dysfunction, mitral regurgitation, tricuspid regurgitation, hypertension, COPD, hyperlipidemia, LBBB, hypothyroidism, type 2 diabetes, cardiology was consulted for preop evaluation for right hip INP, postop course was complicated by A-fib with RVR, patient was given IV amiodarone, successfully converted to sinus rhythm on 01/22/2022.  She was recommended to increase metoprolol to 100 mg twice daily while continue home medication Eliquis.  She had recurrent A-fib with RVR 01/24/2019 3 AM with ventricular rate up to 140-150s, cardiology is reconsulted today.   Assessment & Plan    Persistent A-fib with RVR: History of persistent A-fib/flutter, takes Lopressor 75 mg twice daily PTA, previously stopped amiodarone due to hypothyroidis. Now with A-fib RVR postoperatively, converted to sinus rhythm with amiodarone drip on 01/22/2022, with recurrence of A-fib /flutter RVR 01/23/2022, now back in sinus rhythm. Likely postop/anemia mediated, TSH normal on 9/2 -Echo with LVEF newly decreased to 35 to 40%, suspect tachyarrhythmia mediated CM versus due to LBBB - Continue amiodarone taper with 400mg  BID for 7 days and 200mg  daily after.  Would continue amiodarone as she recovers from  her surgery, can consider stopping as outpatient - Continue anticoagulation with low dose Eliquis (weight and age) - Continue metoprolol, will transition to toprol XL given heart failure  Acute combined heart failure Pulmonary hypertension Mitral regurgitation Tricuspid regurgitation -Mildly volume overloaded on exam, will restart home Lasix 20 mg daily.  -GDMT: Continue metoprolol, will transition to toprol XL. Added losartan 25mg  daily  Right hip fracture Anemia Hypertension Type 2 diabetes COPD Hypothyroidism -Managed per primary   For questions or updates, please contact [facility] Please consult [email or web address] for contact info under        Signed, Little Ishikawa, MD  01/25/2022, 10:55 AM

## 2022-01-25 NOTE — Progress Notes (Signed)
PROGRESS NOTE    [name]  DVV:[id number] DOB: [birth year] DOA: 01/18/2022 PCP: John Giovanni, MD     Brief Narrative:  88 year old female (very active at baseline still lives alone not driving but cooks 3 square meals a day takes medications out of a pouch has mainly hardness of hearing but no significant dementia) with a history of persistent atrial fibrillation/atrial flutter previously on amiodarone, hypertension, severe mitral regurgitation, moderate to severe tricuspid regurgitation, pulmonary hypertension, left bundle branch block, diabetes mellitus type 2, diet-controlled presenting with mechanical fall while doing dishes in the kitchen.  The patient denied any syncope.  She denied any prodromal symptoms including any dizziness, chest pain, shortness of breath.  She presented to the emergency department with pain on the right side.  X-ray of the right hip showed intertrochanteric right hip fracture. Patient voiced preference for being seen by Dr. Susa Simmonds and for him to perform surgery he was consulted and he recommends transferring the patient over to [facility] [facility] will place on a diet until patient can be seen-it is unclear when surgery will be performed  01/23/2022: Patient seen alongside patient's daughter analysis.  Patient has gone back to A-fib with RVR.  Heart rate has ranged from 130 to 150 bpm.  Cardiology had patient on amiodarone until yesterday.  Will restart patient on amiodarone.  We will ask cardiology team to continue following patient.  Blood pressure stable.  Patient is also on beta-blocker.  No chest pain or shortness of breath reported.  No constitutional symptoms reported.  Patient is on Xarelto 2.5 Mg p.o. twice daily.  Serum creatinine is improving.  Serum creatinine today is 1.05.  01/25/2022: Patient seen.  No new changes.  Patient is not on oral amiodarone.  Pursue disposition.   Subjective: No chest pain. No shortness of breath.  Assessment & Plan:   Principal Problem:   A-fib (HCC) Active Problems:   Dysphagia, pharyngoesophageal phase   Diabetes (HCC)   Atrial flutter with rapid ventricular response (HCC)   GERD (gastroesophageal reflux disease)   Dysphagia, oropharyngeal   Closed fracture of right hip (HCC)   Fall   Paroxysmal SVT (supraventricular tachycardia) (HCC)   Acute combined systolic and diastolic heart failure (HCC)   Fall his left hip intertrochanteric fracture -s/p IMN , plan per ortho -Pursue disposition.    Post op anemia No overt external bleeding Hemoglobin is 8.3 g/dL today. Patient is also iron deficient. We will start patient on oral iron.  A-fib RVR -Was taken off amiodarone long-term due to thyroid side effect prior to admission  -on amiodarone drip perioperatively for heart rate control, now back to NSR -now on increase betablocker, continue eliquis, cards signed off on 9/1, cards will arrange outpatient follow up 01/23/2022: Patient seen.  Rapid A-fib.  Will restart amiodarone.  We will consult cardiology team.  Patient may require oral amiodarone on discharge. 01/25/2022: Patient is not on oral amiodarone 400 Mg p.o. twice daily.  Systolic CHF Appear euvolemic to dry on exam   Underweight The patient's BMI is: Body mass index is 16.8 kg/m..   I have Reviewed nursing notes, Vitals, pain scores, I/o's, Lab results and  imaging results since pt's last encounter, details please see discussion above  I ordered the following labs:  Unresulted Labs (From admission, onward)    None        DVT prophylaxis: apixaban (ELIQUIS) tablet 2.5 mg Start: 01/21/22 1000 SCDs Start: 01/20/22 1737 apixaban (ELIQUIS) tablet 2.5 mg   Code  Status:   Code Status: DNR  Family Communication: Family at bedside on 8/30, 9/1 Disposition:    Dispo: The patient is from: home, lives alone               Anticipated d/c is to: SNF on 9/2 if hgb stable, per social worker need dc summary done by 11am if patient is  medically stable to d/c on 9/2             Antimicrobials:    Anti-infectives (From admission, onward)    Start     Dose/Rate Route Frequency Ordered Stop   01/20/22 2200  ceFAZolin (ANCEF) IVPB 2g/100 mL premix        2 g 200 mL/hr over 30 Minutes Intravenous Every 8 hours 01/20/22 1736 01/21/22 1654   01/20/22 0600  ceFAZolin (ANCEF) IVPB 2g/100 mL premix        2 g 200 mL/hr over 30 Minutes Intravenous To Short Stay 01/19/22 2244 01/20/22 1458   01/19/22 0945  ceFAZolin (ANCEF) IVPB 2g/100 mL premix  Status:  Discontinued        2 g 200 mL/hr over 30 Minutes Intravenous On call to O.R. 01/19/22 0936 01/20/22 0559          Objective: Vitals:   01/24/22 2032 01/25/22 0600 01/25/22 0827 01/25/22 1458  BP: (!) 148/53 (!) 149/62 (!) 153/67 (!) 176/54  Pulse: 71 65 73 62  Resp: 19 19  18   Temp: 98 F (36.7 C) 98.2 F (36.8 C)  97.8 F (36.6 C)  TempSrc: Oral Oral  Oral  SpO2: 100% 100%    Weight:      Height:        Intake/Output Summary (Last 24 hours) at 01/25/2022 1653 Last data filed at 01/25/2022 1500 Gross per 24 hour  Intake 410 ml  Output --  Net 410 ml    Filed Weights   01/18/22 1608 01/20/22 1333  Weight: 45.8 kg 45.8 kg    Examination:  General exam: aaox3, very pleasant and nice. Respiratory system: Clear to auscultation.  Cardiovascular system: S1-S2, irregularly irregular.   Gastrointestinal system: Abdomen is nondistended, soft and nontender.  Normal bowel sounds heard. Central nervous system: Alert and oriented. No focal neurological deficits. Extremities: No leg edema.  Data Reviewed: I have personally reviewed  labs and visualized  imaging studies since the last encounter and formulate the plan        Scheduled Meds:  [START ON 01/30/2022] amiodarone  200 mg Oral Daily   amiodarone  400 mg Oral BID   apixaban  2.5 mg Oral BID   cholecalciferol  1,000 Units Oral Daily   feeding supplement  237 mL Oral BID BM   ferrous sulfate  325 mg  Oral Q breakfast   furosemide  20 mg Oral Daily   levothyroxine  25 mcg Oral QAC breakfast   losartan  25 mg Oral Daily   [START ON 01/26/2022] metoprolol succinate  100 mg Oral Daily   metoprolol tartrate  100 mg Oral BID   multivitamin with minerals  1 tablet Oral Daily   pantoprazole  40 mg Oral Daily   polyethylene glycol  17 g Oral Daily   senna-docusate  1 tablet Oral BID   Continuous Infusions:  methocarbamol (ROBAXIN) IV       LOS: 7 days   Time spent: 35 minutes  03/28/2022, MD  [facility]  Available via Epic secure chat 7am-7pm for nonurgent issues Please page  for urgent issues To page the attending provider between 7A-7P or the covering provider during after hours 7P-7A, please log into the web site [email or web address] and access using universal [place] password for that web site. If you do not have the password, please call the hospital operator.    01/25/2022, 4:53 PM

## 2022-01-25 NOTE — TOC Progression Note (Signed)
Transition of Care [facility]) - Progression Note    Patient Details  Name: [name] MRN: [id number] Date of Birth: [birth year]  Transition of Care [facility]) CM/SW Contact  Carley Hammed, [place] Phone Number: 01/25/2022, 1:03 PM  Clinical Narrative:    CSW notified pt not medically ready over the weekend. CSW attempted to follow up with facility, VM left. Pt's Berkley Harvey is still good until tomorrow. TOC will continue to follow for DC needs.   Expected Discharge Plan: Skilled Nursing Facility Barriers to Discharge: Continued Medical Work up, SNF Pending bed offer  Expected Discharge Plan and Services Expected Discharge Plan: Skilled Nursing Facility In-house Referral: Clinical Social Work   Post Acute Care Choice: Skilled Nursing Facility Living arrangements for the past 2 months: Single Family Home                                       Social Determinants of Health (SDOH) Interventions    Readmission Risk Interventions     No data to display

## 2022-01-26 DIAGNOSIS — S72001A Fracture of unspecified part of neck of right femur, initial encounter for closed fracture: Secondary | ICD-10-CM | POA: Diagnosis not present

## 2022-01-26 DIAGNOSIS — I480 Paroxysmal atrial fibrillation: Secondary | ICD-10-CM | POA: Diagnosis not present

## 2022-01-26 MED ORDER — AMIODARONE HCL 200 MG PO TABS: ORAL_TABLET | ORAL | 0 refills | Status: DC

## 2022-01-26 MED ORDER — SENNOSIDES-DOCUSATE SODIUM 8.6-50 MG PO TABS
1.0000 | ORAL_TABLET | Freq: Two times a day (BID) | ORAL | 0 refills | Status: DC
Start: 2022-01-26 — End: 2022-03-22

## 2022-01-26 MED ORDER — ENSURE ENLIVE PO LIQD: 237.0000 mL | Freq: Two times a day (BID) | ORAL | 12 refills | Status: AC

## 2022-01-26 MED ORDER — ADULT MULTIVITAMIN W/MINERALS CH: 1.0000 | ORAL_TABLET | Freq: Every day | ORAL | 0 refills | Status: AC

## 2022-01-26 MED ORDER — FERROUS SULFATE 325 (65 FE) MG PO TABS: 325.0000 mg | ORAL_TABLET | Freq: Every day | ORAL | 0 refills | Status: AC

## 2022-01-26 MED ORDER — METOPROLOL SUCCINATE ER 100 MG PO TB24
100.0000 mg | ORAL_TABLET | Freq: Every day | ORAL | 0 refills | Status: DC
Start: 2022-01-27 — End: 2022-03-24

## 2022-01-26 MED ORDER — POLYETHYLENE GLYCOL 3350 17 G PO PACK: 17.0000 g | PACK | Freq: Every day | ORAL | 0 refills | Status: DC

## 2022-01-26 MED ORDER — LOSARTAN POTASSIUM 25 MG PO TABS: 25.0000 mg | ORAL_TABLET | Freq: Every day | ORAL | 0 refills | Status: DC

## 2022-01-26 NOTE — Discharge Summary (Signed)
Physician Discharge Summary  Patient ID: [name] MRN: [id number] DOB/AGE: [birth year] 88 y.o.  Admit date: 01/18/2022 Discharge date: 01/26/2022  Admission Diagnoses:  Discharge Diagnoses:  Principal Problem:   A-fib [facility]) Active Problems:   Dysphagia, pharyngoesophageal phase   Diabetes (HCC)   Atrial flutter with rapid ventricular response (HCC)   GERD (gastroesophageal reflux disease)   Dysphagia, oropharyngeal   Closed fracture of right hip (HCC)   Fall   Paroxysmal SVT (supraventricular tachycardia) (HCC)   Acute combined systolic and diastolic heart failure (HCC)   Discharged Condition: stable  Hospital Course: Patient is a 88 year old African-American female with past medical history significant for persistent atrial fibrillation/atrial flutter previously on amiodarone, hypertension, severe mitral regurgitation, moderate to severe tricuspid regurgitation, pulmonary hypertension, left bundle branch block and diet-controlled diabetes mellitus.  Patient was admitted with right hip fracture following a mechanical fall.  X-ray of the right hip done on presentation revealed intertrochanteric right hip fracture.  Patient underwent cephalo-medullary nailing of right intertrochanteric femur fractureintramedullary nail on 01/20/2022.  Postoperative course was significant for atrial fibrillation with rapid response.  Cardiology team was consulted to assist with patient's management.  Atrial fibrillation with rapid ventricular response was initially managed with amiodarone drip.  Amiodarone drip was eventually transitioned to oral amiodarone.  Patient will complete 7-day course of amiodarone 400 Mg p.o. twice daily, then decrease to 200 Mg p.o. once daily.  Further decrease in the dose of amiodarone will be done by the cardiology team on outpatient basis.  Heart rate has been significantly controlled most of the time.  Patient has been cleared for discharge by the orthopedic team and the  cardiology team.  Patient will be discharged to skilled nursing facility for short-term rehab.  Chronic systolic congestive heart failure: -Patient will be discharged on beta-blocker, diuretics and ARB. -Echocardiogram done on 820 02/11/2019 revealed left ventricular estimated ejection fraction of 35 to 40%.     Consults: cardiology and orthopedic surgery  Significant Diagnostic Studies:  Echocardiogram done on 2022-01-26 revealed:  1. Technically difficult study, in Afib with RVR. Left ventricular  ejection fraction, by estimation, is 35 to 40%. The left ventricle has  moderately decreased function. The left ventricle demonstrates regional  wall motion abnormalities. Abnormal  (paradoxical) septal motion, consistent with left bundle branch block      There is mild left ventricular hypertrophy. Left ventricular diastolic  parameters are indeterminate.   2. Right ventricular systolic function is mildly reduced. The right  ventricular size is normal. There is moderately elevated pulmonary artery  systolic pressure. The estimated right ventricular systolic pressure is  50.1 mmHg.   3. Left atrial size was severely dilated.   4. The mitral valve is normal in structure. Trivial mitral valve  regurgitation.   5. Tricuspid valve regurgitation is moderate to severe.   6. The aortic valve is tricuspid. Aortic valve regurgitation is not  visualized. Aortic valve sclerosis is present, with no evidence of aortic  valve stenosis.   7. The inferior vena cava is normal in size with greater than 50%  respiratory variability, suggesting right atrial pressure of 3 mmHg.     Discharge Exam: Blood pressure (!) 152/82, pulse 83, temperature 98 F (36.7 C), temperature source Oral, resp. rate 18, height 5\' 5"  (1.651 m), weight 50.6 kg, SpO2 100 %.   Disposition: Discharge disposition: 03-Skilled Nursing Facility       Discharge Instructions     Diet - low sodium heart healthy   Complete  by:  As directed    Discharge wound care:   Complete by: As directed    Continue wound care as per orthopedic team   Increase activity slowly   Complete by: As directed       Allergies as of 01/26/2022       Reactions   Chocolate    Hives        Medication List     STOP taking these medications    bisacodyl 5 MG EC tablet Commonly known as: DULCOLAX   cetirizine 10 MG tablet Commonly known as: ZYRTEC   COD LIVER OIL PO   dextromethorphan 15 MG/5ML syrup   DULCOLAX PO   famotidine 20 MG tablet Commonly known as: PEPCID   metoprolol tartrate 50 MG tablet Commonly known as: LOPRESSOR       TAKE these medications    Accu-Chek Aviva Plus test strip Generic drug: glucose blood 1 each daily.   acetaminophen 650 MG CR tablet Commonly known as: TYLENOL Take 650 mg by mouth as needed for pain.   Adjustable Lancing Device Misc USE TO check blood glucose   albuterol 108 (90 Base) MCG/ACT inhaler Commonly known as: VENTOLIN HFA Inhale 1-2 puffs into the lungs every 4 (four) hours as needed for wheezing or shortness of breath.   ALPRAZolam 0.25 MG tablet Commonly known as: XANAX Take 0.25 mg by mouth at bedtime as needed for anxiety.   amiodarone 200 MG tablet Commonly known as: PACERONE Amiodarone 400 mg po Bid for 6 days, then 200 mg po daily   atorvastatin 40 MG tablet Commonly known as: LIPITOR Take 40 mg by mouth daily at 6 PM.   cloNIDine 0.1 MG tablet Commonly known as: CATAPRES Take 0.1 mg by mouth daily. For blood pressure greater than 140/90   Easy Comfort Lancets Misc TO test blood glucose ONCE daily   Eliquis 2.5 MG Tabs tablet Generic drug: apixaban TAKE (1) TABLET TWICE DAILY. What changed: See the new instructions.   feeding supplement Liqd Take 237 mLs by mouth 2 (two) times daily between meals.   ferrous sulfate 325 (65 FE) MG tablet Take 1 tablet (325 mg total) by mouth daily with breakfast. Start taking on: January 27, 2022    furosemide 20 MG tablet Commonly known as: LASIX Take 20 mg by mouth daily as needed.   HYDROcodone-acetaminophen 5-325 MG tablet Commonly known as: NORCO/VICODIN Take 1 tablet by mouth every 6 (six) hours as needed for severe pain.   levothyroxine 25 MCG tablet Commonly known as: SYNTHROID Take 25 mcg by mouth daily before breakfast.   losartan 25 MG tablet Commonly known as: COZAAR Take 1 tablet (25 mg total) by mouth daily. Start taking on: January 27, 2022   metoprolol succinate 100 MG 24 hr tablet Commonly known as: TOPROL-XL Take 1 tablet (100 mg total) by mouth daily. Take with or immediately following a meal. Start taking on: January 27, 2022   multivitamin with minerals Tabs tablet Take 1 tablet by mouth daily. Start taking on: January 27, 2022   pantoprazole 40 MG tablet Commonly known as: PROTONIX Take 1 tablet (40 mg total) by mouth daily.   polyethylene glycol 17 g packet Commonly known as: MIRALAX / GLYCOLAX Take 17 g by mouth daily. Start taking on: January 27, 2022   senna-docusate 8.6-50 MG tablet Commonly known as: Senokot-S Take 1 tablet by mouth 2 (two) times daily.   Vitamin D3 25 MCG (1000 UT) Caps Take by mouth.  Discharge Care Instructions  (From admission, onward)           Start     Ordered   01/26/22 0000  Discharge wound care:       Comments: Continue wound care as per orthopedic team   01/26/22 1229            Contact information for follow-up providers     Haddix, Gillie Manners, MD Follow up in 2 week(s).   Specialty: Orthopedic Surgery Why: post op follow up Contact information: [address] [place] [address] [phone]              Contact information for after-discharge care     Destination     [facility] Preferred SNF .   Service: Skilled Nursing Contact information: [address] [place]  [address] [phone]                     Signed: Barnetta Chapel 01/26/2022, 12:30 PM

## 2022-01-26 NOTE — Progress Notes (Signed)
Occupational Therapy Treatment Patient Details Name: [name] MRN: [id number] DOB: [birth year] Today's Date: 01/26/2022   History of present illness [name] is a 88 y.o. female with a hx of persistent A-fib on Eliquis, moderate diastolic dysfunction, severe MR, moderate to severe TR, severe pulmonary hypertension, esophageal dilations, hypertension COPD, hyperlipidemia, LBBB, hypothyroidism, diabetes type 2 who suffered fall at home resulting in R hip fracture. Pt is s/p cephalomedullary nailing of R intertrochanteric femur fracture.   OT comments  Pt progressing towards goals, needing min-max A for ADLs, increased assistance needed for LB ADLs, min A for bed mobility, and min A for step pivot transfer to chair with RW. Pt's family in room and supportive during session. Pt presenting with impairments listed below, will follow acutely. Continue to recommend SNF at d/c.   Recommendations for follow up therapy are one component of a multi-disciplinary discharge planning process, led by the attending physician.  Recommendations may be updated based on patient status, additional functional criteria and insurance authorization.    Follow Up Recommendations  Skilled nursing-short term rehab (<3 hours/day)    Assistance Recommended at Discharge Frequent or constant Supervision/Assistance  Patient can return home with the following  A lot of help with bathing/dressing/bathroom;A little help with walking and/or transfers   Equipment Recommendations  BSC/3in1;Other (comment) (RW)    Recommendations for Other Services PT consult    Precautions / Restrictions Precautions Precautions: Fall Precaution Comments: monitor HR Restrictions Weight Bearing Restrictions: No RLE Weight Bearing: Weight bearing as tolerated       Mobility Bed Mobility Overal bed mobility: Needs Assistance Bed Mobility: Supine to Sit     Supine to sit: Min assist     General bed mobility comments:  increased time, manually assists RLE to EOB    Transfers Overall transfer level: Needs assistance Equipment used: Rolling walker (2 wheels) Transfers: Sit to/from Stand, Bed to chair/wheelchair/BSC Sit to Stand: Min assist     Step pivot transfers: Min assist     General transfer comment: completed stepping in place as a warm up prior to tranfer to chair     Balance Overall balance assessment: Needs assistance Sitting-balance support: No upper extremity supported, Feet supported Sitting balance-Leahy Scale: Fair   Postural control: Posterior lean Standing balance support: Bilateral upper extremity supported, Reliant on assistive device for balance Standing balance-Leahy Scale: Poor Standing balance comment: reliant on external support                           ADL either performed or assessed with clinical judgement   ADL Overall ADL's : Needs assistance/impaired             Lower Body Bathing: Minimal assistance;Sitting/lateral leans   Upper Body Dressing : Minimal assistance;Sitting Upper Body Dressing Details (indicate cue type and reason): donning gown Lower Body Dressing: Maximal assistance;Sit to/from stand Lower Body Dressing Details (indicate cue type and reason): doffing underwear, donning socks Toilet Transfer: Minimal assistance;Rolling walker (2 wheels);Stand-pivot;BSC/3in1 Statistician Details (indicate cue type and reason): simulated to chair         Functional mobility during ADLs: Minimal assistance;Rolling walker (2 wheels)      Extremity/Trunk Assessment Upper Extremity Assessment Upper Extremity Assessment: RUE deficits/detail RUE Deficits / Details: limited shoulder flexion to 80*   Lower Extremity Assessment Lower Extremity Assessment: Defer to PT evaluation        Vision   Vision Assessment?: No apparent visual deficits  Perception Perception Perception: Not tested   Praxis Praxis Praxis: Not tested     Cognition Arousal/Alertness: Awake/alert Behavior During Therapy: WFL for tasks assessed/performed Overall Cognitive Status: Difficult to assess                                          Exercises      Shoulder Instructions       General Comments VSS on RA, family present during session    Pertinent Vitals/ Pain       Pain Assessment Pain Assessment: No/denies pain Pain Intervention(s): Monitored during session  Home Living                                          Prior Functioning/Environment              Frequency  Min 2X/week        Progress Toward Goals  OT Goals(current goals can now be found in the care plan section)  Progress towards OT goals: Progressing toward goals  Acute Rehab OT Goals OT Goal Formulation: With patient Time For Goal Achievement: 02/03/22 Potential to Achieve Goals: Good ADL Goals Pt Will Perform Lower Body Bathing: with min assist;sit to/from stand Pt Will Perform Lower Body Dressing: with min assist;sit to/from stand Pt Will Transfer to Toilet: with min assist;ambulating Pt Will Perform Toileting - Clothing Manipulation and hygiene: with min assist;sit to/from stand  Plan Discharge plan remains appropriate;Frequency remains appropriate    Co-evaluation                 AM-PAC OT "6 Clicks" Daily Activity     Outcome Measure   Help from another person eating meals?: None Help from another person taking care of personal grooming?: A Little Help from another person toileting, which includes using toliet, bedpan, or urinal?: A Lot Help from another person bathing (including washing, rinsing, drying)?: A Lot Help from another person to put on and taking off regular upper body clothing?: A Little Help from another person to put on and taking off regular lower body clothing?: A Lot 6 Click Score: 16    End of Session Equipment Utilized During Treatment: Gait belt;Rolling walker (2  wheels)  OT Visit Diagnosis: Unsteadiness on feet (R26.81);Other abnormalities of gait and mobility (R26.89)   Activity Tolerance Patient tolerated treatment well   Patient Left in chair;with call bell/phone within reach;with chair alarm set   Nurse Communication Mobility status        Time: 0175-1025 OT Time Calculation (min): 26 min  Charges: OT General Charges $OT Visit: 1 Visit OT Treatments $Self Care/Home Management : 23-37 mins  Alfonzo Beers, OTD, OTR/L Acute Rehab [phone]   Mayer Masker 01/26/2022, 12:22 PM

## 2022-01-26 NOTE — TOC Transition Note (Signed)
Transition of Care [facility]) - CM/SW Discharge Note   Patient Details  Name: EVALYNE CORTOPASSI MRN: [id number] Date of Birth: 1927-02-02  [facility]) CM/SW Contact:  Carley Hammed, LCSWA Phone Number: 01/26/2022, 12:43 PM   Clinical Narrative:    Pt to be transported to [facility] via [place]. Nurse to call report to [phone] Rm# [phone]    Final next level of care: Skilled Nursing Facility Barriers to Discharge: Barriers Resolved   Patient Goals and CMS Choice   CMS Medicare.gov Compare Post Acute Care list provided to:: Patient Represenative (must comment) (daughters: Dewayne Hatch and [place]) Choice offered to / list presented to : Adult Children  Discharge Placement              Patient chooses bed at:  [facility]) Patient to be transferred to facility by: [facility] Name of family member notified: [phone] Patient and family notified of of transfer: 01/26/22  Discharge Plan and Services In-house Referral: Clinical Social Work   Post Acute Care Choice: Skilled Nursing Facility                               Social Determinants of Health (SDOH) Interventions     Readmission Risk Interventions     No data to display

## 2022-01-26 NOTE — Care Management Important Message (Signed)
Important Message  Patient Details  Name: [name] MRN: [id number] Date of Birth: [birth year]   Medicare Important Message Given:  Yes     Sherilyn Banker 01/26/2022, 2:54 PM

## 2022-01-26 NOTE — Progress Notes (Addendum)
Rounding Note    Patient Name: [name] Date of Encounter: 01/26/2022  [place] Cardiologist: Rozann Lesches, MD   Subjective   No complaints, unaware of her rhythm. Trying to eat breakfast.  Inpatient Medications    Scheduled Meds:  [START ON 01/30/2022] amiodarone  200 mg Oral Daily   amiodarone  400 mg Oral BID   apixaban  2.5 mg Oral BID   cholecalciferol  1,000 Units Oral Daily   feeding supplement  237 mL Oral BID BM   ferrous sulfate  325 mg Oral Q breakfast   furosemide  20 mg Oral Daily   levothyroxine  25 mcg Oral QAC breakfast   losartan  25 mg Oral Daily   metoprolol succinate  100 mg Oral Daily   multivitamin with minerals  1 tablet Oral Daily   pantoprazole  40 mg Oral Daily   polyethylene glycol  17 g Oral Daily   senna-docusate  1 tablet Oral BID   Continuous Infusions:  methocarbamol (ROBAXIN) IV     PRN Meds: acetaminophen, albuterol, ALPRAZolam, HYDROcodone-acetaminophen, methocarbamol **OR** methocarbamol (ROBAXIN) IV, metoCLOPramide **OR** metoCLOPramide (REGLAN) injection, morphine injection, ondansetron **OR** ondansetron (ZOFRAN) IV   Vital Signs    Vitals:   01/26/22 0300 01/26/22 0336 01/26/22 0500 01/26/22 0612  BP:  (!) 108/59  (!) 139/48  Pulse: 72 72  61  Resp:  20  18  Temp: 98.7 F (37.1 C)     TempSrc: Oral     SpO2:  100%  100%  Weight:   50.6 kg   Height:        Intake/Output Summary (Last 24 hours) at 01/26/2022 0813 Last data filed at 01/26/2022 0336 Gross per 24 hour  Intake 650 ml  Output 300 ml  Net 350 ml      01/26/2022    5:00 AM 01/20/2022    1:33 PM 01/18/2022    4:08 PM  Last 3 Weights  Weight (lbs) 111 lb 8.8 oz 100 lb 15.5 oz 101 lb  Weight (kg) 50.6 kg 45.8 kg 45.813 kg      Telemetry    Appears sinus with PACs and short runs of ?SVT vs Afib RVR, low amplitude p waves, rate controlled - Personally Reviewed  ECG    No new tracings - Personally Reviewed  Physical Exam   GEN: No  acute distress.   Neck: No JVD Cardiac: irregular rhythm, regular rate Respiratory: respirations unlabored GI: Soft, nontender, non-distended  MS: No edema; No deformity. Neuro:  Nonfocal  Psych: Normal affect   Labs    High Sensitivity Troponin:   Recent Labs  Lab 01/18/22 1940 01/18/22 2120  TROPONINIHS 17 17     Chemistry Recent Labs  Lab 01/21/22 0156 01/22/22 0345 01/23/22 0055 01/24/22 0048  NA 138 135 137 137  K 4.3 4.0 3.6 3.9  CL 108 108 109 110  CO2 21* 20* 21* 21*  GLUCOSE 151* 118* 137* 127*  BUN 18 25* 24* 21  CREATININE 1.01* 1.11* 1.05* 0.86  CALCIUM 8.4* 7.9* 8.2* 7.9*  MG 1.7 2.4  --   --   PROT  --   --  5.7*  --   ALBUMIN  --   --  2.5* 2.1*  AST  --   --  29  --   ALT  --   --  9  --   ALKPHOS  --   --  49  --   BILITOT  --   --  0.8  --   GFRNONAA 52* 46* 50* >60  ANIONGAP 9 7 7 6     Lipids No results for input(s): "CHOL", "TRIG", "HDL", "LABVLDL", "LDLCALC", "CHOLHDL" in the last 168 hours.  Hematology Recent Labs  Lab 01/22/22 0345 01/23/22 0055 01/24/22 0048  WBC 8.7 7.7 4.9  RBC 2.59* 2.81*  2.80* 2.40*  HGB 7.6* 8.3* 7.1*  HCT 23.7* 25.5* 21.3*  MCV 91.5 90.7 88.8  MCH 29.3 29.5 29.6  MCHC 32.1 32.5 33.3  RDW 14.5 14.4 14.5  PLT 128* 185 155   Thyroid  Recent Labs  Lab 01/23/22 0055  TSH 2.095    BNPNo results for input(s): "BNP", "PROBNP" in the last 168 hours.  DDimer  Recent Labs  Lab 01/23/22 1235  DDIMER 1.57*     Radiology    No results found.  Cardiac Studies   Echocardiogram from 01/19/2022:   1. Technically difficult study, in Afib with RVR. Left ventricular  ejection fraction, by estimation, is 35 to 40%. The left ventricle has  moderately decreased function. The left ventricle demonstrates regional  wall motion abnormalities. Abnormal  (paradoxical) septal motion, consistent with left bundle branch block      There is mild left ventricular hypertrophy. Left ventricular diastolic  parameters  are indeterminate.   2. Right ventricular systolic function is mildly reduced. The right  ventricular size is normal. There is moderately elevated pulmonary artery  systolic pressure. The estimated right ventricular systolic pressure is  50.1 mmHg.   3. Left atrial size was severely dilated.   4. The mitral valve is normal in structure. Trivial mitral valve  regurgitation.   5. Tricuspid valve regurgitation is moderate to severe.   6. The aortic valve is tricuspid. Aortic valve regurgitation is not  visualized. Aortic valve sclerosis is present, with no evidence of aortic  valve stenosis.   7. The inferior vena cava is normal in size with greater than 50%  respiratory variability, suggesting right atrial pressure of 3 mmHg.   Patient Profile     88 y.o. female with PMH of persistent A-fib on Eliquis, diastolic dysfunction, mitral regurgitation, tricuspid regurgitation, hypertension, COPD, hyperlipidemia, LBBB, hypothyroidism, type 2 diabetes, cardiology was consulted for preop evaluation for right hip INP, postop course was complicated by A-fib with RVR, patient was given IV amiodarone, successfully converted to sinus rhythm on 01/22/2022.  She was recommended to increase metoprolol to 100 mg twice daily while continue home medication Eliquis.  She had recurrent A-fib with RVR 01/24/2019 3 AM with ventricular rate up to 140-150s, cardiology is reconsulted today.   Assessment & Plan    Persistent Afib Has been maintained on lopressor 75 mg BID for Afib PTA.  - presented with hip fracture and converted to Afib RVR post-op.  - she converted to sinus rhythm on amiodarone gtt on 01/22/22 with recurrent Afib 01/23/22 --> now back in sinus rhythm - now on PO amiodarone load --> continue post-op, can consider weaning in the OP setting - lopressor tartrate has been transitioned to toprol for GDMT given reduced EF - may still be having some breakthrough Afib, but rate controlled - continue low dose  eliquis - consider rechecking H/H   Acute systolic heart failure Pulmonary hypertension Mitral regurgitation Tricuspid regurgitation Echo with LVEF 35-40% Continue toprol, added losartan, and restarted home lasix (on home dose) - appears comfortable in bed   I will arrange cardiology follow up.     For questions or updates, please contact [facility]  Please consult [email or web address] for contact info under        Signed, Marcelino Duster, PA  01/26/2022, 8:13 AM    Patient examined chart reviewed Volume status ok cachectic black female post hip surgery. NSR on amiodarone and lopressor Continue amiodarone 400 bid until d/c to SNF then decrease to 200 mg daily Continue low dose eliquis Give lasix if she requires transfusion for falling Hct 20 mg oral   Charlton Haws MD [facility]

## 2022-02-22 ENCOUNTER — Other Ambulatory Visit: Payer: Self-pay | Admitting: Cardiology

## 2022-02-22 DIAGNOSIS — R058 Other specified cough: Secondary | ICD-10-CM

## 2022-02-23 ENCOUNTER — Encounter: Payer: Self-pay | Admitting: Student

## 2022-02-23 ENCOUNTER — Ambulatory Visit: Payer: Medicare Other | Attending: Student | Admitting: Student

## 2022-02-23 VITALS — BP 158/72 | HR 84 | Wt 110.0 lb

## 2022-02-23 DIAGNOSIS — I4819 Other persistent atrial fibrillation: Secondary | ICD-10-CM

## 2022-02-23 DIAGNOSIS — I38 Endocarditis, valve unspecified: Secondary | ICD-10-CM

## 2022-02-23 DIAGNOSIS — E039 Hypothyroidism, unspecified: Secondary | ICD-10-CM

## 2022-02-23 DIAGNOSIS — I5020 Unspecified systolic (congestive) heart failure: Secondary | ICD-10-CM

## 2022-02-23 DIAGNOSIS — Z79899 Other long term (current) drug therapy: Secondary | ICD-10-CM

## 2022-02-23 DIAGNOSIS — I10 Essential (primary) hypertension: Secondary | ICD-10-CM

## 2022-02-23 MED ORDER — FUROSEMIDE 20 MG PO TABS: 20.0000 mg | ORAL_TABLET | Freq: Every day | ORAL | 4 refills | Status: AC | PRN

## 2022-02-23 MED ORDER — AMIODARONE HCL 200 MG PO TABS: 200.0000 mg | ORAL_TABLET | Freq: Every day | ORAL | 0 refills | Status: AC

## 2022-02-23 MED ORDER — LOSARTAN POTASSIUM 25 MG PO TABS: 25.0000 mg | ORAL_TABLET | Freq: Every day | ORAL | 11 refills | Status: AC

## 2022-02-23 NOTE — Progress Notes (Signed)
Cardiology Office Note    Date:  02/23/2022   ID:  [name], DOB [birth year], MRN [id number]  PCP:  Charlene Andrea, MD  Cardiologist: Charlene Lesches, MD    Chief Complaint  Patient presents with   Hospitalization Follow-up    History of Present Illness:    [name] is a 88 y.o. female with past medical history of persistent atrial fibrillation, HFrEF (EF 35-40% by echo in 12/2021), moderate to severe TR, history of MR, pulmonary HTN, HTN, HLD, Type 2 DM and COPD who presents to the office today for hospital follow-up.  She was most recently admitted to [facility] from 8/28 - 01/26/2022 after suffering a mechanical fall found to have an acute hip fracture. She did undergo cephalomedullary nailing of the right intertrochanteric femur fracture on 01/20/2022. Cardiology was consulted for preoperative cardiac clearance and to assist with management of atrial fibrillation with RVR. Repeat echocardiogram did show her EF was reduced at 35 to 40% and this was felt to be secondary to her elevated rates. She required the use of IV Amiodarone and converted back to normal sinus rhythm but had recurrent atrial fibrillation. She was back in normal sinus rhythm at the time of hospital discharge and was switched to PO Amiodarone loading. Lopressor was also transitioned to Toprol-XL 100mg  daily in the setting of her cardiomyopathy with Losartan being added to her medication regimen.   In talking with the patient and her 2 daughters today, she reports she has returned home from SNF. She is being followed closely by [facility] as she developed pressure sores along her feet while at rehab. She is working with PT and trying to increase her strength. She does experience intermittent dyspnea but says this improves with taking a deep breath. No specific orthopnea or PND. She has experienced lower extremity edema but has not utilized Lasix recently. No specific chest pain or palpitations. She has been  receiving Amiodarone 200 mg daily along with Eliquis 2.5 mg twice daily and Toprol-XL 100 mg daily but did not have a prescription for Losartan.   Past Medical History:  Diagnosis Date   Anxiety    Atrial fibrillation (HCC)    CHF (congestive heart failure) ([place])    a. EF 35-40% by echo in 12/2021   COPD (chronic obstructive pulmonary disease) ([place])    Depression    Essential hypertension    Hyperlipidemia    Hypothyroidism    Mitral regurgitation    Palpitations    PAT/EAT/NSVT, normal LVEF   Type 2 diabetes mellitus ([place])     Past Surgical History:  Procedure Laterality Date   ESOPHAGOGASTRODUODENOSCOPY N/A 04/17/2014   RMR: 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 hiatal hernia. Antral erosions status post gastric biopsy.   ESOPHAGOGASTRODUODENOSCOPY N/A 05/12/2016   Dr. Gala Romney: moderate Schatzki ring s/p dilation. moderate sized hiatal hernia   INTRAMEDULLARY (IM) NAIL INTERTROCHANTERIC Right 01/20/2022   Procedure: INTRAMEDULLARY (IM) NAIL INTERTROCHANTERIC;  Surgeon: Charlene Needles, MD;  Location: [facility];  Service: Orthopedics;  Laterality: Right;   MALONEY DILATION N/A 04/17/2014   Procedure: Venia Minks DILATION;  Surgeon: Charlene Dolin, MD;  [facility];  Service: Endoscopy;  Laterality: N/A;   MALONEY DILATION N/A 05/12/2016   Procedure: Venia Minks DILATION;  Surgeon: Charlene Dolin, MD;  [facility];  Service: Endoscopy;  Laterality: N/A;   none      Current Medications: Outpatient Medications Prior to  Visit  Medication Sig Dispense Refill   ACCU-CHEK AVIVA PLUS test strip 1 each daily.     acetaminophen (TYLENOL) 650 MG CR tablet Take 650 mg by mouth as needed for pain.     albuterol (VENTOLIN HFA) 108 (90 Base) MCG/ACT inhaler Inhale 1-2 puffs into the lungs every 4 (four) hours as needed for wheezing or shortness of breath. 8 g 0   ALPRAZolam (XANAX) 0.25 MG tablet Take 0.25 mg by mouth  at bedtime as needed for anxiety.     apixaban (ELIQUIS) 2.5 MG TABS tablet TAKE (1) TABLET TWICE DAILY. (Patient taking differently: Take 2.5 mg by mouth 2 (two) times daily.) 60 tablet 5   atorvastatin (LIPITOR) 40 MG tablet Take 40 mg by mouth daily at 6 PM.     Cholecalciferol (VITAMIN D3) 25 MCG (1000 UT) CAPS Take by mouth.     EASY COMFORT LANCETS MISC TO test blood glucose ONCE daily     feeding supplement (ENSURE ENLIVE / ENSURE PLUS) LIQD Take 237 mLs by mouth 2 (two) times daily between meals. 237 mL 12   ferrous sulfate 325 (65 FE) MG tablet Take 1 tablet (325 mg total) by mouth daily with breakfast. 30 tablet 0   HYDROcodone-acetaminophen (NORCO/VICODIN) 5-325 MG tablet Take 1 tablet by mouth every 6 (six) hours as needed for severe pain. 10 tablet 0   Lancet Devices (ADJUSTABLE LANCING DEVICE) MISC USE TO check blood glucose     levothyroxine (SYNTHROID, LEVOTHROID) 25 MCG tablet Take 25 mcg by mouth daily before breakfast.     metoprolol succinate (TOPROL-XL) 100 MG 24 hr tablet Take 1 tablet (100 mg total) by mouth daily. Take with or immediately following a meal. 30 tablet 0   Multiple Vitamin (MULTIVITAMIN WITH MINERALS) TABS tablet Take 1 tablet by mouth daily. 30 tablet 0   pantoprazole (PROTONIX) 40 MG tablet Take 1 tablet (40 mg total) by mouth daily. 28 tablet 4   polyethylene glycol (MIRALAX / GLYCOLAX) 17 g packet Take 17 g by mouth daily. 14 each 0   senna-docusate (SENOKOT-S) 8.6-50 MG tablet Take 1 tablet by mouth 2 (two) times daily. 60 tablet 0   amiodarone (PACERONE) 200 MG tablet Amiodarone 400 mg po Bid for 6 days, then 200 mg po daily 42 tablet 0   cloNIDine (CATAPRES) 0.1 MG tablet Take 0.1 mg by mouth daily. For blood pressure greater than 140/90     furosemide (LASIX) 20 MG tablet Take 20 mg by mouth daily as needed.     losartan (COZAAR) 25 MG tablet Take 1 tablet (25 mg total) by mouth daily. 30 tablet 0   No facility-administered medications prior to visit.      Allergies:   Chocolate   Social History   Socioeconomic History   Marital status: Married    Spouse name: Not on file   Number of children: Not on file   Years of education: 11   Highest education level: Not on file  Occupational History   Occupation: Retired    Associate Professor: RETIRED  Tobacco Use   Smoking status: Former   Smokeless tobacco: Never  Building services engineer Use: Never used  Substance and Sexual Activity   Alcohol use: No    Alcohol/week: 0.0 standard drinks of alcohol   Drug use: No   Sexual activity: Never  Other Topics Concern   Not on file  Social History Narrative   Not on file   Social Determinants of Health  Financial Resource Strain: Not on file  Food Insecurity: Not on file  Transportation Needs: Not on file  Physical Activity: Not on file  Stress: Not on file  Social Connections: Not on file     Family History:  The patient's family history includes Arthritis in an other family member; Cancer in her brother; Colon cancer in an other family member; Heart failure in her mother; Tuberculosis in her father.   Review of Systems:    Please see the history of present illness.     All other systems reviewed and are otherwise negative except as noted above.   Physical Exam:    VS:  BP (!) 158/72   Pulse 84   Wt 110 lb (49.9 kg)   SpO2 100%   BMI 18.30 kg/m    General: Pleasant, thin elderly female appearing in no acute distress. Head: Normocephalic, atraumatic. Neck: No carotid bruits. JVD not elevated.  Lungs: Respirations regular and unlabored, without wheezes or rales.  Heart: Regular rate and rhythm. No S3 or S4.  No murmur, no rubs, or gallops appreciated. Abdomen: Appears non-distended. No obvious abdominal masses. Msk:  Strength and tone appear normal for age. No obvious joint deformities or effusions. Extremities: No clubbing or cyanosis. 1+ pitting edema up to mid-shins bilaterally.  Distal pedal pulses are 2+ bilaterally. Neuro:  Alert and oriented X 3. Moves all extremities spontaneously. No focal deficits noted. Psych:  Responds to questions appropriately with a normal affect. Skin: No rashes or lesions noted  Wt Readings from Last 3 Encounters:  02/23/22 110 lb (49.9 kg)  01/26/22 111 lb 8.8 oz (50.6 kg)  11/19/21 99 lb 12.8 oz (45.3 kg)     Studies/Labs Reviewed:   EKG:  EKG is not ordered today.  Recent Labs: 01/22/2022: Magnesium 2.4 01/23/2022: ALT 9; TSH 2.095 01/24/2022: BUN 21; Creatinine, Ser 0.86; Hemoglobin 7.1; Platelets 155; Potassium 3.9; Sodium 137   Lipid Panel    Component Value Date/Time   CHOL 142 07/14/2014 0456   TRIG 32 07/14/2014 0456   HDL 58 07/14/2014 0456   CHOLHDL 2.4 07/14/2014 0456   VLDL 6 07/14/2014 0456   LDLCALC 78 07/14/2014 0456    Additional studies/ records that were reviewed today include:   Echocardiogram: 12/2021 IMPRESSIONS     1. Technically difficult study, in Afib with RVR. Left ventricular  ejection fraction, by estimation, is 35 to 40%. The left ventricle has  moderately decreased function. The left ventricle demonstrates regional  wall motion abnormalities. Abnormal  (paradoxical) septal motion, consistent with left bundle branch block      There is mild left ventricular hypertrophy. Left ventricular diastolic  parameters are indeterminate.   2. Right ventricular systolic function is mildly reduced. The right  ventricular size is normal. There is moderately elevated pulmonary artery  systolic pressure. The estimated right ventricular systolic pressure is  50.1 mmHg.   3. Left atrial size was severely dilated.   4. The mitral valve is normal in structure. Trivial mitral valve  regurgitation.   5. Tricuspid valve regurgitation is moderate to severe.   6. The aortic valve is tricuspid. Aortic valve regurgitation is not  visualized. Aortic valve sclerosis is present, with no evidence of aortic  valve stenosis.   7. The inferior vena cava is normal  in size with greater than 50%  respiratory variability, suggesting right atrial pressure of 3 mmHg.   Assessment:    1. Persistent atrial fibrillation (HCC)   2. HFrEF (heart failure  with reduced ejection fraction) (HCC)   3. Medication management   4. Valvular heart disease   5. Essential hypertension   6. Hypothyroidism, unspecified type      Plan:   In order of problems listed above:  1. Persistent Atrial Fibrillation - This was a recurrent issue for the patient during her recent admission and she was started on Amiodarone given persistent tachycardia. She is in NSR by examination today. She previously developed hypothyroidism when on Amiodarone and it was recommended during her admission to continue Amiodarone and supplement her thyroid if she developed recurrent hypothyroidism. Will recheck a TSH at her next office visit (was normal at 2.095 in 01/2022). Continue Toprol-XL 100mg  daily.  - She remains on Eliquis 2.5mg  BID for anticoagulation which is the appropriate dose at this time given her age of 25 and weight at 110 lbs.   2. HFrEF - Her EF was at 35-40% by echo in 12/2021 and possibly tachycardia-mediated. She has been switched to Toprol-XL 100mg  daily and will plan to restart Losartan 25mg  daily. Recheck a BMET in 2 weeks. Can consider switching to [facility] in the future but need to be cautious as she had intermittent hypotension in the past. Would not add an SGLT2 inhibitor at this time given her body habitus and lower extremity wounds. Can ultimately plan for a repeat echocardiogram in several months for reassessment of her EF.  - She does have some lower extremity edema on examination today and I recommended she take Lasix 20mg  for the next 3-4 days then as needed.    3. TR/Pulmonary HTN - Recent echo showed her PASP was moderately elevated at 50.1 mmHg and TR was in a moderate to severe range. Can obtain a follow-up echo in several months. Would anticipate conservative  management given her age.   4. HTN -  Her BP was initially elevated to 162/52 today, rechecked and at 158/72. Will continue Toprol-XL but plan to restart Losartan. Repeat BMET in 2 weeks.   5. Hypothyroidism - Remains on Synthroid 25 mcg daily. Would plan to recheck at her next visit given prior significant hypothyroidism with Amiodarone.    Medication Adjustments/Labs and Tests Ordered: Current medicines are reviewed at length with the patient today.  Concerns regarding medicines are outlined above.  Medication changes, Labs and Tests ordered today are listed in the Patient Instructions below. Patient Instructions  Medication Instructions:   Take Lasix daily for the next 3-4 days and then daily as needed  Labwork: BNP BMET  in 2 weeks  Testing/Procedures: None today  Follow-Up: Early December with , PA-C  Any Other Special Instructions Will Be Listed Below (If Applicable).  If you need a refill on your cardiac medications before your next appointment, please call your pharmacy.    Signed, [facility], PA-C  02/23/2022 8:46 PM    [facility] [address] [place], [place] 04/25/2022 Phone: [phone] Fax: [phone]

## 2022-02-23 NOTE — Patient Instructions (Addendum)
Medication Instructions:   Take Lasix daily for the next 3-4 days and then daily as needed  Labwork: BNP BMET  in 2 weeks  Testing/Procedures: None today  Follow-Up: Early December with Bernerd Pho, PA-C  Any Other Special Instructions Will Be Listed Below (If Applicable).  If you need a refill on your cardiac medications before your next appointment, please call your pharmacy.

## 2022-02-25 ENCOUNTER — Encounter: Payer: Self-pay | Admitting: *Deleted

## 2022-03-15 ENCOUNTER — Encounter (HOSPITAL_COMMUNITY): Payer: Self-pay

## 2022-03-15 ENCOUNTER — Inpatient Hospital Stay: Admit: 2022-03-15 | Payer: Medicare Other | Admitting: Internal Medicine

## 2022-03-17 ENCOUNTER — Encounter (HOSPITAL_COMMUNITY): Payer: Self-pay

## 2022-03-17 ENCOUNTER — Inpatient Hospital Stay: Admit: 2022-03-17 | Payer: Medicare Other | Admitting: Internal Medicine

## 2022-03-17 NOTE — Anesthesia Preprocedure Evaluation (Addendum)
Anesthesia Evaluation  Patient identified by MRN, date of birth, ID band Patient awake    Reviewed: Allergy & Precautions, NPO status , Patient's Chart, lab work & pertinent test results  Airway Mallampati: II  TM Distance: >3 FB Neck ROM: Full    Dental  (+) Edentulous Upper, Edentulous Lower   Pulmonary COPD,  COPD inhaler, former smoker,    Pulmonary exam normal breath sounds clear to auscultation       Cardiovascular hypertension, Pt. on medications and Pt. on home beta blockers pulmonary hypertension+CHF  Normal cardiovascular exam+ dysrhythmias Atrial Fibrillation + Valvular Problems/Murmurs  Rhythm:Regular Rate:Normal  TTE 12/2021:  1. Technically difficult study, in Afib with RVR. Left ventricular ejection fraction, by estimation, is 35 to 40%. The left ventricle has moderately decreased function. The left ventricle demonstrates regional wall motion abnormalities. Abnormal (paradoxical) septal motion, consistent with left bundle branch block.There is mild left ventricular hypertrophy. Left ventricular diastolic  parameters are indeterminate.  2. Right ventricular systolic function is mildly reduced. The right ventricular size is normal. There is moderately elevated pulmonary artery systolic pressure. The estimated right ventricular systolic pressure is 14.4 mmHg.  3. Left atrial size was severely dilated.  4. The mitral valve is normal in structure. Trivial mitral valve regurgitation.  5. Tricuspid valve regurgitation is moderate to severe.  6. The aortic valve is tricuspid. Aortic valve regurgitation is not visualized. Aortic valve sclerosis is present, with no evidence of aortic valve stenosis.  7. The inferior vena cava is normal in size with greater than 50% respiratory variability, suggesting right atrial pressure of 3 mmHg.    LBBB   Neuro/Psych Anxiety Depression    GI/Hepatic Neg liver ROS, PUD, GERD   Medicated,  Endo/Other  diabetes, Type 2Hypothyroidism   Renal/GU negative Renal ROS     Musculoskeletal   Abdominal   Peds  Hematology  (+) Blood dyscrasia, anemia ,   Anesthesia Other Findings Day of surgery medications reviewed with patient.  Reproductive/Obstetrics                          Anesthesia Physical Anesthesia Plan  ASA: 4  Anesthesia Plan: General   Post-op Pain Management: Tylenol PO (pre-op)*   Induction: Intravenous  PONV Risk Score and Plan: 3 and Treatment may vary due to age or medical condition, Ondansetron and Dexamethasone  Airway Management Planned: Oral ETT  Additional Equipment: Arterial line  Intra-op Plan:   Post-operative Plan: Possible Post-op intubation/ventilation  Informed Consent: I have reviewed the patients History and Physical, chart, labs and discussed the procedure including the risks, benefits and alternatives for the proposed anesthesia with the patient or authorized representative who has indicated his/her understanding and acceptance.   Patient has DNR.  Discussed DNR with patient and Discussed DNR with power of attorney.   Dental advisory given  Plan Discussed with: CRNA  Anesthesia Plan Comments: (Partial code. Family requests no CPR. Medical code only.)   Anesthesia Quick Evaluation

## 2022-03-18 ENCOUNTER — Encounter (HOSPITAL_COMMUNITY): Payer: Self-pay | Admitting: Internal Medicine

## 2022-03-18 ENCOUNTER — Inpatient Hospital Stay (HOSPITAL_COMMUNITY)
Admission: RE | Admit: 2022-03-18 | Discharge: 2022-03-24 | DRG: 480 | Disposition: A | Discharge status: Skilled Nursing Facility | Payer: Medicare Other | Source: Ambulatory Visit | Attending: Internal Medicine | Admitting: Internal Medicine

## 2022-03-18 ENCOUNTER — Other Ambulatory Visit: Payer: Self-pay

## 2022-03-18 ENCOUNTER — Inpatient Hospital Stay (HOSPITAL_COMMUNITY): Payer: Medicare Other

## 2022-03-18 DIAGNOSIS — I081 Rheumatic disorders of both mitral and tricuspid valves: Secondary | ICD-10-CM | POA: Diagnosis present

## 2022-03-18 DIAGNOSIS — N390 Urinary tract infection, site not specified: Secondary | ICD-10-CM | POA: Diagnosis present

## 2022-03-18 DIAGNOSIS — Z66 Do not resuscitate: Secondary | ICD-10-CM | POA: Diagnosis present

## 2022-03-18 DIAGNOSIS — I5022 Chronic systolic (congestive) heart failure: Secondary | ICD-10-CM | POA: Diagnosis present

## 2022-03-18 DIAGNOSIS — I4819 Other persistent atrial fibrillation: Secondary | ICD-10-CM | POA: Diagnosis present

## 2022-03-18 DIAGNOSIS — E11621 Type 2 diabetes mellitus with foot ulcer: Secondary | ICD-10-CM | POA: Diagnosis present

## 2022-03-18 DIAGNOSIS — I10 Essential (primary) hypertension: Secondary | ICD-10-CM | POA: Diagnosis present

## 2022-03-18 DIAGNOSIS — I447 Left bundle-branch block, unspecified: Secondary | ICD-10-CM | POA: Diagnosis present

## 2022-03-18 DIAGNOSIS — L89614 Pressure ulcer of right heel, stage 4: Secondary | ICD-10-CM | POA: Diagnosis present

## 2022-03-18 DIAGNOSIS — E440 Moderate protein-calorie malnutrition: Secondary | ICD-10-CM | POA: Diagnosis present

## 2022-03-18 DIAGNOSIS — Z87891 Personal history of nicotine dependence: Secondary | ICD-10-CM | POA: Diagnosis not present

## 2022-03-18 DIAGNOSIS — L8990 Pressure ulcer of unspecified site, unspecified stage: Secondary | ICD-10-CM | POA: Insufficient documentation

## 2022-03-18 DIAGNOSIS — S72001A Fracture of unspecified part of neck of right femur, initial encounter for closed fracture: Secondary | ICD-10-CM | POA: Diagnosis present

## 2022-03-18 DIAGNOSIS — D509 Iron deficiency anemia, unspecified: Secondary | ICD-10-CM | POA: Diagnosis present

## 2022-03-18 DIAGNOSIS — M9701XA Periprosthetic fracture around internal prosthetic right hip joint, initial encounter: Secondary | ICD-10-CM | POA: Diagnosis present

## 2022-03-18 DIAGNOSIS — E785 Hyperlipidemia, unspecified: Secondary | ICD-10-CM | POA: Diagnosis present

## 2022-03-18 DIAGNOSIS — Z831 Family history of other infectious and parasitic diseases: Secondary | ICD-10-CM

## 2022-03-18 DIAGNOSIS — D508 Other iron deficiency anemias: Secondary | ICD-10-CM | POA: Diagnosis not present

## 2022-03-18 DIAGNOSIS — L97414 Non-pressure chronic ulcer of right heel and midfoot with necrosis of bone: Secondary | ICD-10-CM

## 2022-03-18 DIAGNOSIS — Z79899 Other long term (current) drug therapy: Secondary | ICD-10-CM

## 2022-03-18 DIAGNOSIS — F32A Depression, unspecified: Secondary | ICD-10-CM | POA: Diagnosis present

## 2022-03-18 DIAGNOSIS — F419 Anxiety disorder, unspecified: Secondary | ICD-10-CM | POA: Diagnosis present

## 2022-03-18 DIAGNOSIS — E039 Hypothyroidism, unspecified: Secondary | ICD-10-CM | POA: Diagnosis present

## 2022-03-18 DIAGNOSIS — S72351A Displaced comminuted fracture of shaft of right femur, initial encounter for closed fracture: Secondary | ICD-10-CM

## 2022-03-18 DIAGNOSIS — Z681 Body mass index (BMI) 19 or less, adult: Secondary | ICD-10-CM

## 2022-03-18 DIAGNOSIS — S7291XA Unspecified fracture of right femur, initial encounter for closed fracture: Secondary | ICD-10-CM | POA: Diagnosis present

## 2022-03-18 DIAGNOSIS — L89152 Pressure ulcer of sacral region, stage 2: Secondary | ICD-10-CM | POA: Diagnosis present

## 2022-03-18 DIAGNOSIS — Z8249 Family history of ischemic heart disease and other diseases of the circulatory system: Secondary | ICD-10-CM

## 2022-03-18 DIAGNOSIS — W1830XA Fall on same level, unspecified, initial encounter: Secondary | ICD-10-CM | POA: Diagnosis present

## 2022-03-18 DIAGNOSIS — I110 Hypertensive heart disease with heart failure: Secondary | ICD-10-CM | POA: Diagnosis present

## 2022-03-18 DIAGNOSIS — I272 Pulmonary hypertension, unspecified: Secondary | ICD-10-CM | POA: Diagnosis present

## 2022-03-18 DIAGNOSIS — D62 Acute posthemorrhagic anemia: Secondary | ICD-10-CM | POA: Diagnosis present

## 2022-03-18 DIAGNOSIS — J449 Chronic obstructive pulmonary disease, unspecified: Secondary | ICD-10-CM | POA: Diagnosis present

## 2022-03-18 DIAGNOSIS — Z7901 Long term (current) use of anticoagulants: Secondary | ICD-10-CM | POA: Diagnosis not present

## 2022-03-18 DIAGNOSIS — Z79890 Hormone replacement therapy (postmenopausal): Secondary | ICD-10-CM

## 2022-03-18 DIAGNOSIS — Z91018 Allergy to other foods: Secondary | ICD-10-CM

## 2022-03-18 DIAGNOSIS — F418 Other specified anxiety disorders: Secondary | ICD-10-CM | POA: Diagnosis not present

## 2022-03-18 LAB — BASIC METABOLIC PANEL
Anion gap: 10 (ref 5–15)
BUN: 20 mg/dL (ref 8–23)
CO2: 25 mmol/L (ref 22–32)
Calcium: 8.1 mg/dL — ABNORMAL LOW (ref 8.9–10.3)
Chloride: 103 mmol/L (ref 98–111)
Creatinine, Ser: 0.92 mg/dL (ref 0.44–1.00)
GFR, Estimated: 58 mL/min — ABNORMAL LOW (ref >60)
Glucose, Bld: 94 mg/dL (ref 70–99)
Potassium: 4.4 mmol/L (ref 3.5–5.1)
Sodium: 138 mmol/L (ref 135–145)

## 2022-03-18 LAB — CBC
HCT: 18.7 % — ABNORMAL LOW (ref 36.0–46.0)
Hemoglobin: 5.7 g/dL — CL (ref 12.0–15.0)
MCH: 27.9 pg (ref 26.0–34.0)
MCHC: 30.5 g/dL (ref 30.0–36.0)
MCV: 91.7 fL (ref 80.0–100.0)
Platelets: 216 10*3/uL (ref 150–400)
RBC: 2.04 MIL/uL — ABNORMAL LOW (ref 3.87–5.11)
RDW: 15.6 % — ABNORMAL HIGH (ref 11.5–15.5)
WBC: 10.4 10*3/uL (ref 4.0–10.5)
nRBC: 0 % (ref 0.0–0.2)

## 2022-03-18 LAB — SURGICAL PCR SCREEN
MRSA, PCR: NEGATIVE
Staphylococcus aureus: NEGATIVE

## 2022-03-18 LAB — GLUCOSE, CAPILLARY
Glucose-Capillary: 90 mg/dL (ref 70–99)
Glucose-Capillary: 82 mg/dL (ref 70–99)

## 2022-03-18 LAB — PREPARE RBC (CROSSMATCH)

## 2022-03-18 LAB — BRAIN NATRIURETIC PEPTIDE: B Natriuretic Peptide: 301.8 pg/mL — ABNORMAL HIGH (ref 0.0–100.0)

## 2022-03-18 MED ORDER — ROCURONIUM BROMIDE 10 MG/ML (PF) SYRINGE
PREFILLED_SYRINGE | INTRAVENOUS | Status: AC
  Filled 2022-03-18: qty 10

## 2022-03-18 MED ORDER — LEVOTHYROXINE SODIUM 25 MCG PO TABS
25.0000 ug | ORAL_TABLET | Freq: Every day | ORAL | Status: DC
  Administered 2022-03-20 – 2022-03-24 (×5): 25 ug via ORAL
  Filled 2022-03-18 (×5): qty 1

## 2022-03-18 MED ORDER — METOPROLOL SUCCINATE ER 50 MG PO TB24
50.0000 mg | ORAL_TABLET | Freq: Every day | ORAL | Status: DC
  Administered 2022-03-20 – 2022-03-24 (×5): 50 mg via ORAL
  Filled 2022-03-18 (×5): qty 1

## 2022-03-18 MED ORDER — VITAMIN D 25 MCG (1000 UNIT) PO TABS
1000.0000 [IU] | ORAL_TABLET | Freq: Every day | ORAL | Status: DC
  Administered 2022-03-18 – 2022-03-22 (×4): 1000 [IU] via ORAL
  Filled 2022-03-18 (×5): qty 1

## 2022-03-18 MED ORDER — AMIODARONE HCL 200 MG PO TABS
200.0000 mg | ORAL_TABLET | Freq: Every day | ORAL | Status: DC
  Administered 2022-03-18 – 2022-03-24 (×6): 200 mg via ORAL
  Filled 2022-03-18 (×7): qty 1

## 2022-03-18 MED ORDER — ALPRAZOLAM 0.25 MG PO TABS
0.2500 mg | ORAL_TABLET | Freq: Every evening | ORAL | Status: DC | PRN
  Administered 2022-03-18 – 2022-03-21 (×2): 0.25 mg via ORAL
  Filled 2022-03-18 (×2): qty 1

## 2022-03-18 MED ORDER — ACETAMINOPHEN 325 MG PO TABS: 650.0000 mg | ORAL_TABLET | Freq: Four times a day (QID) | ORAL | Status: DC | PRN

## 2022-03-18 MED ORDER — PIPERACILLIN-TAZOBACTAM 3.375 G IVPB 30 MIN
3.3750 g | Freq: Once | INTRAVENOUS | Status: DC
  Filled 2022-03-18 (×2): qty 50

## 2022-03-18 MED ORDER — ONDANSETRON HCL 4 MG/2ML IJ SOLN
INTRAMUSCULAR | Status: AC
  Filled 2022-03-18: qty 2

## 2022-03-18 MED ORDER — FENTANYL CITRATE (PF) 250 MCG/5ML IJ SOLN
INTRAMUSCULAR | Status: AC
  Filled 2022-03-18: qty 5

## 2022-03-18 MED ORDER — FENTANYL CITRATE (PF) 100 MCG/2ML IJ SOLN
INTRAMUSCULAR | Status: AC
  Administered 2022-03-18: 25 ug via INTRAVENOUS
  Filled 2022-03-18: qty 2

## 2022-03-18 MED ORDER — LACTATED RINGERS IV SOLN: INTRAVENOUS | Status: DC

## 2022-03-18 MED ORDER — ACETAMINOPHEN 650 MG RE SUPP: 650.0000 mg | Freq: Four times a day (QID) | RECTAL | Status: DC | PRN

## 2022-03-18 MED ORDER — ATORVASTATIN CALCIUM 40 MG PO TABS
40.0000 mg | ORAL_TABLET | Freq: Every day | ORAL | Status: DC
  Administered 2022-03-18 – 2022-03-23 (×6): 40 mg via ORAL
  Filled 2022-03-18 (×6): qty 1

## 2022-03-18 MED ORDER — HYDROCODONE-ACETAMINOPHEN 5-325 MG PO TABS: 1.0000 | ORAL_TABLET | Freq: Four times a day (QID) | ORAL | Status: DC | PRN

## 2022-03-18 MED ORDER — FENTANYL CITRATE (PF) 100 MCG/2ML IJ SOLN
25.0000 ug | Freq: Once | INTRAMUSCULAR | Status: AC
  Administered 2022-03-18: 25 ug via INTRAVENOUS

## 2022-03-18 MED ORDER — UMECLIDINIUM BROMIDE 62.5 MCG/ACT IN AEPB
1.0000 | INHALATION_SPRAY | Freq: Every day | RESPIRATORY_TRACT | Status: DC
  Administered 2022-03-21 – 2022-03-24 (×4): 1 via RESPIRATORY_TRACT
  Filled 2022-03-18: qty 7

## 2022-03-18 MED ORDER — DEXAMETHASONE SODIUM PHOSPHATE 10 MG/ML IJ SOLN
INTRAMUSCULAR | Status: AC
  Filled 2022-03-18: qty 1

## 2022-03-18 MED ORDER — SENNOSIDES-DOCUSATE SODIUM 8.6-50 MG PO TABS
1.0000 | ORAL_TABLET | Freq: Two times a day (BID) | ORAL | Status: DC
  Administered 2022-03-18 – 2022-03-23 (×9): 1 via ORAL
  Filled 2022-03-18 (×12): qty 1

## 2022-03-18 MED ORDER — PROPOFOL 10 MG/ML IV BOLUS
INTRAVENOUS | Status: AC
  Filled 2022-03-18: qty 20

## 2022-03-18 MED ORDER — ORAL CARE MOUTH RINSE: 15.0000 mL | Freq: Once | OROMUCOSAL | Status: AC

## 2022-03-18 MED ORDER — LEVALBUTEROL HCL 0.63 MG/3ML IN NEBU: 0.6300 mg | INHALATION_SOLUTION | Freq: Four times a day (QID) | RESPIRATORY_TRACT | Status: DC | PRN

## 2022-03-18 MED ORDER — FUROSEMIDE 20 MG PO TABS: 20.0000 mg | ORAL_TABLET | Freq: Every day | ORAL | Status: DC | PRN

## 2022-03-18 MED ORDER — FERROUS SULFATE 325 (65 FE) MG PO TABS
325.0000 mg | ORAL_TABLET | Freq: Every day | ORAL | Status: DC
  Administered 2022-03-20 – 2022-03-24 (×5): 325 mg via ORAL
  Filled 2022-03-18 (×5): qty 1

## 2022-03-18 MED ORDER — ONDANSETRON HCL 4 MG/2ML IJ SOLN: 4.0000 mg | Freq: Four times a day (QID) | INTRAMUSCULAR | Status: DC | PRN

## 2022-03-18 MED ORDER — LIDOCAINE 2% (20 MG/ML) 5 ML SYRINGE
INTRAMUSCULAR | Status: AC
  Filled 2022-03-18: qty 5

## 2022-03-18 MED ORDER — ONDANSETRON HCL 4 MG PO TABS: 4.0000 mg | ORAL_TABLET | Freq: Four times a day (QID) | ORAL | Status: DC | PRN

## 2022-03-18 MED ORDER — ACETAMINOPHEN 325 MG PO TABS
650.0000 mg | ORAL_TABLET | Freq: Three times a day (TID) | ORAL | Status: DC
  Administered 2022-03-18 – 2022-03-24 (×14): 650 mg via ORAL
  Filled 2022-03-18 (×15): qty 2

## 2022-03-18 MED ORDER — FENTANYL CITRATE (PF) 100 MCG/2ML IJ SOLN: 25.0000 ug | Freq: Once | INTRAMUSCULAR | Status: AC | PRN

## 2022-03-18 MED ORDER — ETOMIDATE 2 MG/ML IV SOLN
INTRAVENOUS | Status: AC
  Filled 2022-03-18: qty 10

## 2022-03-18 MED ORDER — PANTOPRAZOLE SODIUM 40 MG PO TBEC
40.0000 mg | DELAYED_RELEASE_TABLET | Freq: Every day | ORAL | Status: DC
  Administered 2022-03-18 – 2022-03-24 (×5): 40 mg via ORAL
  Filled 2022-03-18 (×7): qty 1

## 2022-03-18 MED ORDER — MORPHINE SULFATE (PF) 2 MG/ML IV SOLN
0.5000 mg | INTRAVENOUS | Status: DC | PRN
  Administered 2022-03-18: 0.5 mg via INTRAVENOUS
  Filled 2022-03-18: qty 1

## 2022-03-18 MED ORDER — CHLORHEXIDINE GLUCONATE 0.12 % MT SOLN
15.0000 mL | Freq: Once | OROMUCOSAL | Status: AC
  Administered 2022-03-18: 15 mL via OROMUCOSAL

## 2022-03-18 NOTE — Progress Notes (Signed)
Orthopedic Tech Progress Note Patient Details:  [name] Nov 27, 1926 [id number]  Skeletal traction applied to RLE with Dr. Marcelino Scot and Ainsley Spinner, PA-C.   I also communicated with the 5N secretary earlier today that this pt will need Prevalon boots to be ordered with [employer] through the [place] system. He said he is ordering them.  Musculoskeletal Traction Type of Traction: Skeletal (Balanced Suspension) Traction Location: RLE Traction Weight: 20 lbs   Post Interventions Patient Tolerated: Well  [name] 03/18/2022, 8:41 PM

## 2022-03-18 NOTE — Progress Notes (Signed)
CRITICAL RESULT PROVIDER NOTIFICATION  Test performed and critical result:  Hbg 5.7  Date and time result received:  03/18/22 1421  Provider name/title: A. Chartered loss adjuster and S. Daiton Cowles RN  Date and time provider notified: 03/18/22 1423   Date and time provider responded: 03/18/22 1423 Dr. Smith Robert and Dr. Marcelino Scot   Provider response:En route and See new orders Order to transfuse 2 units PRBCs

## 2022-03-18 NOTE — Consult Note (Signed)
Orthopaedic Trauma Service (OTS) Consultation   Patient ID: [name] MRN: [id number] DOB/AGE: 09-07-1926 88 y.o.   Reason for Consult: right femur fracture Referring Physician: Ledell Noss EDP  HPI: [name] is an 88 y.o. female 8 wks s/p IMN of right hip intertroch who was doing very well when she sustained a new fall with immediate pain, shortening, and malrotation. X-rays confirmed fracture at the tip of the nail. Patient also has bilateral heel pressure ulcers with foul odor on the right heel.  Past Medical History:  Diagnosis Date   Anxiety    Atrial fibrillation (HCC)    CHF (congestive heart failure) ([place])    a. 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 hiatal hernia. Antral erosions status post gastric biopsy.   ESOPHAGOGASTRODUODENOSCOPY N/A 05/12/2016   Dr. Gala Romney: moderate Schatzki ring s/p dilation. moderate sized hiatal hernia   INTRAMEDULLARY (IM) NAIL INTERTROCHANTERIC Right 01/20/2022   Procedure: INTRAMEDULLARY (IM) NAIL INTERTROCHANTERIC;  Surgeon: Shona Needles, MD;  Location: [facility];  Service: Orthopedics;  Laterality: Right;   MALONEY DILATION N/A 04/17/2014   Procedure: Venia Minks DILATION;  Surgeon: Daneil Dolin, MD;  [facility];  Service: Endoscopy;  Laterality: N/A;   MALONEY DILATION N/A 05/12/2016   Procedure: Venia Minks DILATION;  Surgeon: Daneil Dolin, MD;  [facility];  Service: Endoscopy;  Laterality: N/A;   none      Family History  Problem Relation Age of Onset   Heart failure  Mother        Died in her 19s   Tuberculosis Father        Died at young age   59 Brother    Arthritis Other    Colon cancer Other     Social History:  reports that she has quit smoking. She has never used smokeless tobacco. She reports that she does not drink alcohol and does not use drugs.  Allergies:  Allergies  Allergen Reactions   Chocolate Hives    Medications: Prior to Admission:  Medications Prior to Admission  Medication Sig Dispense Refill Last Dose   acetaminophen (TYLENOL) 650 MG CR tablet Take 650 mg by mouth every 6 (six) hours as needed for pain.      albuterol (VENTOLIN HFA) 108 (90 Base) MCG/ACT inhaler Inhale 1-2 puffs into the lungs every 4 (four) hours as needed for wheezing or shortness of breath. 8 g 0    ALPRAZolam (XANAX) 0.25 MG tablet Take 0.25 mg by mouth at bedtime as needed for anxiety.      amiodarone (PACERONE) 200 MG tablet Take 1 tablet (200 mg total) by mouth daily. 90 tablet 0    apixaban (ELIQUIS) 2.5 MG TABS tablet TAKE (1) TABLET TWICE DAILY. (Patient taking differently: Take 2.5 mg by mouth 2 (two) times daily.) 60 tablet 5    atorvastatin (LIPITOR) 40 MG tablet Take 40 mg by mouth daily at 6 PM.      bismuth subsalicylate (PEPTO BISMOL) 262 MG/15ML suspension  Take 30 mLs by mouth every 6 (six) hours as needed for indigestion.      Cholecalciferol (VITAMIN D3) 25 MCG (1000 UT) CAPS Take 1,000 Units by mouth daily.      COD LIVER OIL PO Take 1 capsule by mouth every 14 (fourteen) days.      feeding supplement (ENSURE ENLIVE / ENSURE PLUS) LIQD Take 237 mLs by mouth 2 (two) times daily between meals. (Patient taking differently: Take 237 mLs by mouth with breakfast, with lunch, and with evening meal.) 237 mL 12    ferrous sulfate 325 (65 FE) MG tablet Take 1 tablet (325 mg total) by mouth daily with breakfast. 30 tablet 0    furosemide (LASIX) 20 MG tablet Take 1 tablet (20 mg total) by mouth daily as needed. 30 tablet 4    levothyroxine  (SYNTHROID, LEVOTHROID) 25 MCG tablet Take 25 mcg by mouth daily before breakfast.      losartan (COZAAR) 25 MG tablet Take 1 tablet (25 mg total) by mouth daily. 30 tablet 11    metoprolol succinate (TOPROL-XL) 100 MG 24 hr tablet Take 1 tablet (100 mg total) by mouth daily. Take with or immediately following a meal. 30 tablet 0    Multiple Vitamins-Minerals (THERA-M ENHANCED PO) Take 1 tablet by mouth daily.      pantoprazole (PROTONIX) 40 MG tablet Take 1 tablet (40 mg total) by mouth daily. 28 tablet 4    senna-docusate (SENOKOT-S) 8.6-50 MG tablet Take 1 tablet by mouth 2 (two) times daily. 60 tablet 0    Wound Dressings (MEDIHONEY WOUND/BURN DRESSING) GEL Apply 1 Application topically 2 (two) times daily.      ACCU-CHEK AVIVA PLUS test strip 1 each daily.      EASY COMFORT LANCETS MISC TO test blood glucose ONCE daily      HYDROcodone-acetaminophen (NORCO/VICODIN) 5-325 MG tablet Take 1 tablet by mouth every 6 (six) hours as needed for severe pain. (Patient not taking: Reported on 03/16/2022) 10 tablet 0 Not Taking   Lancet Devices (ADJUSTABLE LANCING DEVICE) MISC USE TO check blood glucose      polyethylene glycol (MIRALAX / GLYCOLAX) 17 g packet Take 17 g by mouth daily. (Patient not taking: Reported on 03/16/2022) 14 each 0 Not Taking    Results for orders placed or performed during the hospital encounter of 03/18/22 (from the past 48 hour(s))  Type and screen [place]     Status: None (Preliminary result)   Collection Time: 03/18/22  1:54 PM  Result Value Ref Range   ABO/RH(D) PENDING    Antibody Screen PENDING    Sample Expiration      03/21/2022,2359 Performed at [facility], [place] [address] [place], [place] [address]   Glucose, capillary     Status: None   Collection Time: 03/18/22  2:10 PM  Result Value Ref Range   Glucose-Capillary 90 70 - 99 mg/dL    Comment: Glucose reference range applies only to samples taken after fasting for at least 8  hours.    No results found.  Intake/Output    None      ROS Denies recent fever, bleeding abnormalities, urologic dysfunction, GI problems, or weight gain.  Blood pressure (!) 133/47, pulse 87, temperature 98.5 F (36.9 C), temperature source Oral, resp. rate 15, height 5\' 5"  (1.651 m), weight 49.9 kg, SpO2 93 %. Physical Exam Super kind disposition, talkative, cachectic Bilat UEx shoulder, elbow, wrist, digits- no skin wounds, nontender, no instability,  no blocks to motion  Sens  Ax/R/M/U intact  Mot   Ax/ R/ PIN/ M/ AIN/ U intact  Rad 2+  RLE Immobilizer in place which ends at the level of the fracture, spasms intermittent, shortening 5"  Edema/ swelling controlled  Sens: DPN, SPN, TN intact grossly  Motor: EHL, FHL, and lessor toe ext and flex all intact grossly  Brisk cap refill, warm to touch  Heel with foul smelling eschar and drainage over the tuberosity; 4 x 4 cm but also areas of good granulation LLE   Dressing removed with pressure ulcer noted but not complete skin breakdown    Sens: DPN, SPN, TN intact grossly  Motor: EHL, FHL, and lessor toe ext and flex all intact grossly  Brisk cap refill, warm to touch       Gait: could not observe Coordination and balance: could not observe   Assessment/Plan:  Right peri-implant femoral shaft fracture--> anticipate revision to long IM nail vs overlapping plate Dr. Sharol Given has bee contacted for his expertise with foot ulcers DM Afib CHF COPD  The risks and benefits of surgery were discussed with the patient and her family, including the possibility of infection, nerve injury, vessel injury, wound breakdown, arthritis, symptomatic hardware, DVT/ PE, loss of motion, malunion, nonunion, and need for further surgery among others.  We also specifically discussed the need to stage surgery because of the elevated risk of soft tissue breakdown that could lead to amputation.  These risks were acknowledged and consent provided to  proceed.  ADDENDUM: Hgb 5.5-->given her acute blood loss anemia, patient will require 2 u PRBC and surgery delayed to tomorrow. I have spoken with my partner, Dr. Katha Hamming, who has agreed to assume management tomorrow and also coordinated care with Dr. Sharol Given who will evaluate and possibly proceed with debridement and biologic grafting if appropriate tomorrow, as well.  Dressings were changed on both heels and foam boots to float the heels applied, as well. In order to better control pain and help restore length, I also discussed with daughter Juliann Pulse and the patient the risks and benefits of skeletal traction, and they did consent to its placement.  PROCEDURE: The knee was brought into flexion with blankets for support and the traction frame apparatus set up on the bed for a vector which would pull distally and in mild hip flexion. After infiltration of the insertion site and periosteum with lidocaine laterally and medially, a k-wire was inserted through the tibia and secured with a tension bow. Because of her slight build 20 lbs of weight were applied and the pin sterilely bandaged with kerlix. There was no pressure on the skin from the bow. She experienced near immediate and profound relief. There was some but far from complete recovery of length.  Patient will proceed to the OR tomorrow for definitive treatment with Drs. Haddix and Sharol Given. Greatly appreciate their care, as well as that of the Medicine Service.Altamese Norman, MD [facility], [facility] [phone]  03/18/2022, 2:13 PM  [facility] [place] [address] [phone] Jenetta [phone] (F)    After 5pm and on the weekends please log on to Amion, go to orthopaedics and the look under the [facility] Call for the provider(s) on call. You can also call our office at [phone] and then follow the prompts to be connected to the call team.

## 2022-03-18 NOTE — Plan of Care (Signed)
  Problem: Education: Goal: Knowledge of General Education information will improve Description: Including pain rating scale, medication(s)/side effects and non-pharmacologic comfort measures Outcome: Progressing   Problem: Health Behavior/Discharge Planning: Goal: Ability to manage health-related needs will improve Outcome: Progressing   Problem: Clinical Measurements: Goal: Ability to maintain clinical measurements within normal limits will improve Outcome: Progressing Goal: Will remain free from infection Outcome: Progressing Goal: Diagnostic test results will improve Outcome: Progressing Goal: Respiratory complications will improve Outcome: Progressing Goal: Cardiovascular complication will be avoided Outcome: Progressing   Problem: Activity: Goal: Risk for activity intolerance will decrease Outcome: Progressing   Problem: Nutrition: Goal: Adequate nutrition will be maintained Outcome: Progressing   Problem: Coping: Goal: Level of anxiety will decrease Outcome: Progressing   Problem: Elimination: Goal: Will not experience complications related to bowel motility Outcome: Progressing Goal: Will not experience complications related to urinary retention Outcome: Progressing   Problem: Pain Managment: Goal: General experience of comfort will improve Outcome: Progressing   Problem: Safety: Goal: Ability to remain free from injury will improve Outcome: Progressing   Problem: Skin Integrity: Goal: Risk for impaired skin integrity will decrease Outcome: Progressing   Problem: Education: Goal: Ability to demonstrate management of disease process will improve Outcome: Progressing Goal: Ability to verbalize understanding of medication therapies will improve Outcome: Progressing Goal: Individualized Educational Video(s) Outcome: Progressing   Problem: Activity: Goal: Capacity to carry out activities will improve Outcome: Progressing   Problem: Cardiac: Goal:  Ability to achieve and maintain adequate cardiopulmonary perfusion will improve Outcome: Progressing   Problem: Education: Goal: Knowledge of disease or condition will improve Outcome: Progressing Goal: Knowledge of the prescribed therapeutic regimen will improve Outcome: Progressing Goal: Individualized Educational Video(s) Outcome: Progressing   Problem: Activity: Goal: Ability to tolerate increased activity will improve Outcome: Progressing Goal: Will verbalize the importance of balancing activity with adequate rest periods Outcome: Progressing   Problem: Respiratory: Goal: Ability to maintain a clear airway will improve Outcome: Progressing Goal: Levels of oxygenation will improve Outcome: Progressing Goal: Ability to maintain adequate ventilation will improve Outcome: Progressing   

## 2022-03-18 NOTE — Progress Notes (Signed)
Report called to [place] on 5N. Family at bedside.

## 2022-03-18 NOTE — H&P (Signed)
History and Physical    [name] VQM:[id number] DOB: [birth year] DOA: 03/18/2022  PCP: Leslie Andrea, MD   Patient coming from: Short Stay Chief Complaints: Low hemoglobin  HPI: [name] is a 88 y.o. female with medical history significant for Persistent A FIB/a Flutter previously on amiodarone, hypertension, severe MR/moderate to severe TR, pulmonary hypertension, LBBB B, DM-diet controlled, COPD recent hospitalization 8/28-01/26/2022 for right hip fracture with A-fib RVR/acute systolic CHF with EF 93-26%, postop anemia who had sustained a new fall at home and right femur diaphysis fracture, no syncope or loss of consciousness reported.  In the ED vital signs showed elevated blood pressure 190/80 hemoglobin 8.5 g, rest of your lites were normal, Dr. Doreatha Martin at [facility] was consulted and agreed with ED physician therefore [facility] transfer for ORIF.  Bed had opened up 10/25 but apparently patient was not n.p.o. surgery canceled, patient got a bed 03/17/22 but she was not n.p.o. so surgery was canceled. Patient arrived to short stay where routine labs showed hemoglobin 5.7 g, 2 units PRBC ordered surgery canceled and admission was requested. In [facility] [place] apparently give rocephin x1 yesterday for ?uti and has foley in place. She is hard of hearing. Patient otherwise denies any nausea, vomiting, chest pain, shortness of breath, fever, chills, headache, focal weakness, numbness tingling, speech difficulties. She has some pain on Rt thigh.   ED Course:  Vitals:   03/18/22 1445 03/18/22 1458 03/18/22 1508 03/18/22 1523  BP: (!) 146/49  (!) 146/47 (!) 145/47  Pulse: 79  79 78  Resp: 13  16 (!) 22  Temp: 98.6 F (37 C)   99.3 F (37.4 C)  TempSrc: Oral   Oral  SpO2: 100% 100% 91% 97%  Weight:      Height:         Assessment/Plan Principal Problem:   Femur fracture, right (HCC) Active Problems:   Benign essential HTN   Iron deficiency anemia   Closed fracture of right hip  (HCC)  Acute blood loss anemia in the setting of chronic anemia Iron deficiency anemia: Acute drop in hemoglobin likely multifactorial in the setting of chronic anemia iron deficiency anemia, with a fracture.  Transfusing 2 U PRBC -recheck H&H post his fusion.  Comminuted periprosthetic fracture at the distal aspect of the nail with Medial displacement angulation and rotation:  Patient had ORIF done with IM nailing back in September now with periprosthetic fracture from from fall.  Surgery canceled today due to anemia, surgery following closely.  Wound infection on heels: Patient has been restarted per orthopedics.  Wound care.  Recent systolic CHF with EF 71-24% MR/TR Pulm hypertension: check BNP, chest x-ray. Resume lasix prn, amio, metoprolol at 50 mg w/ holding parameters.  Paroxysmal A-fib, on Eliquis.  Holding anticoagulation, cont metoprololo, amio, monitor.  Telemetry  COPD/former smoker continue inhaler- not on o2, has diminished BS. Cont prn nebs, inhalers, [place] as needed  Anxiety/depression-mood stable  Diet controlled diabetes  Moderate malnutrition:Body mass index is 18.31 kg/m. [place] dietitian  Severity of Illness: The appropriate patient status for this patient is INPATIENT. Inpatient status is judged to be reasonable and necessary in order to provide the required intensity of service to ensure the patient's safety. The patient's presenting symptoms, physical exam findings, and initial radiographic and laboratory data in the context of their chronic comorbidities is felt to place them at high risk for further clinical deterioration. Furthermore, it is not anticipated that the patient will be medically stable for discharge  from the hospital within 2 midnights of admission.   * I certify that at the point of admission it is my clinical judgment that the patient will require inpatient hospital care spanning beyond 2 midnights from the point of admission due to high intensity  of service, high risk for further deterioration and high frequency of surveillance required.*   DVT prophylaxis: SCDs Start: 03/18/22 1548SCD Code Status:   Code Status: DNR  Family Communication: Admission, patients condition and plan of care including tests being ordered have been discussed with the patient and DAUGHTER who indicate understanding and agree with the plan and Code Status.  Consults called:  ORTHO  Review of Systems: All systems were reviewed and were negative except as mentioned in HPI above. Negative for fever Negative for chest pain Negative for shortness of breath  Past Medical History:  Diagnosis Date   Anxiety    Atrial fibrillation (HCC)    CHF (congestive heart failure) (HCC)    a. EF 35-40% by echo in 12/2021   COPD (chronic obstructive pulmonary disease) ([place])    Depression    Essential hypertension    Hyperlipidemia    Hypothyroidism    Mitral regurgitation    Palpitations    PAT/EAT/NSVT, normal LVEF   Type 2 diabetes mellitus ([place])     Past Surgical History:  Procedure Laterality Date   ESOPHAGOGASTRODUODENOSCOPY N/A 04/17/2014   RMR: 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 hiatal hernia. Antral erosions status post gastric biopsy.   ESOPHAGOGASTRODUODENOSCOPY N/A 05/12/2016   Dr. Gala Romney: moderate Schatzki ring s/p dilation. moderate sized hiatal hernia   INTRAMEDULLARY (IM) NAIL INTERTROCHANTERIC Right 01/20/2022   Procedure: INTRAMEDULLARY (IM) NAIL INTERTROCHANTERIC;  Surgeon: Shona Needles, MD;  Location: [place];  Service: Orthopedics;  Laterality: Right;   MALONEY DILATION N/A 04/17/2014   Procedure: Venia Minks DILATION;  Surgeon: Daneil Dolin, MD;  [facility];  Service: Endoscopy;  Laterality: N/A;   MALONEY DILATION N/A 05/12/2016   Procedure: Venia Minks DILATION;  Surgeon: Daneil Dolin, MD;  [facility];  Service: Endoscopy;  Laterality: N/A;   none        reports that she has quit smoking. She has never used smokeless tobacco. She reports that she does not drink alcohol and does not use drugs.  Allergies  Allergen Reactions   Chocolate Hives    Family History  Problem Relation Age of Onset   Heart failure Mother        Died in her 45s   Tuberculosis Father        Died at young age   33 Brother    Arthritis Other    Colon cancer Other      Prior to Admission medications   Medication Sig Start Date End Date Taking? Authorizing Provider  acetaminophen (TYLENOL) 650 MG CR tablet Take 650 mg by mouth every 6 (six) hours as needed for pain.   Yes [provider]  albuterol (VENTOLIN HFA) 108 (90 Base) MCG/ACT inhaler Inhale 1-2 puffs into the lungs every 4 (four) hours as needed for wheezing or shortness of breath. 05/12/20  Yes Tanda Rockers, MD  ALPRAZolam Duanne Moron) 0.25 MG tablet Take 0.25 mg by mouth at bedtime as needed for anxiety.   Yes [provider]  amiodarone (PACERONE) 200 MG tablet Take 1 tablet (200 mg total) by mouth daily. 02/23/22  Yes Strader, [place], PA-C  apixaban (ELIQUIS) 2.5 MG TABS  tablet TAKE (1) TABLET TWICE DAILY. Patient taking differently: Take 2.5 mg by mouth 2 (two) times daily. 12/17/21  Yes Satira Sark, MD  atorvastatin (LIPITOR) 40 MG tablet Take 40 mg by mouth daily at 6 PM. 09/27/11  Yes Satira Sark, MD  bismuth subsalicylate (PEPTO BISMOL) 262 MG/15ML suspension Take 30 mLs by mouth every 6 (six) hours as needed for indigestion.   Yes [provider]  Cholecalciferol (VITAMIN D3) 25 MCG (1000 UT) CAPS Take 1,000 Units by mouth daily.   Yes [provider]  COD LIVER OIL PO Take 1 capsule by mouth every 14 (fourteen) days.   Yes [provider]  feeding supplement (ENSURE ENLIVE / ENSURE PLUS) LIQD Take 237 mLs by mouth 2 (two) times daily between meals. Patient taking differently: Take 237 mLs by mouth with breakfast, with lunch, and  with evening meal. 01/26/22  Yes Bonnell Public, MD  ferrous sulfate 325 (65 FE) MG tablet Take 1 tablet (325 mg total) by mouth daily with breakfast. 01/27/22 03/16/22 Yes Bonnell Public, MD  furosemide (LASIX) 20 MG tablet Take 1 tablet (20 mg total) by mouth daily as needed. 02/23/22  Yes Strader, [place] M, PA-C  levothyroxine (SYNTHROID, LEVOTHROID) 25 MCG tablet Take 25 mcg by mouth daily before breakfast.   Yes [provider]  losartan (COZAAR) 25 MG tablet Take 1 tablet (25 mg total) by mouth daily. 02/23/22 02/18/23 Yes Strader, Fransisco Hertz, PA-C  metoprolol succinate (TOPROL-XL) 100 MG 24 hr tablet Take 1 tablet (100 mg total) by mouth daily. Take with or immediately following a meal. 01/27/22 03/16/22 Yes Ogbata, Babs Bertin, MD  Multiple Vitamins-Minerals (THERA-M ENHANCED PO) Take 1 tablet by mouth daily.   Yes [provider]  pantoprazole (PROTONIX) 40 MG tablet Take 1 tablet (40 mg total) by mouth daily. 11/19/21  Yes Satira Sark, MD  senna-docusate (SENOKOT-S) 8.6-50 MG tablet Take 1 tablet by mouth 2 (two) times daily. 01/26/22 03/27/22 Yes Bonnell Public, MD  Wound Dressings (MEDIHONEY WOUND/BURN DRESSING) GEL Apply 1 Application topically 2 (two) times daily.   Yes [provider]  ACCU-CHEK AVIVA PLUS test strip 1 each daily. 01/01/20   [provider]  EASY COMFORT LANCETS MISC TO test blood glucose ONCE daily 07/11/18   [provider]  HYDROcodone-acetaminophen (NORCO/VICODIN) 5-325 MG tablet Take 1 tablet by mouth every 6 (six) hours as needed for severe pain. Patient not taking: Reported on 03/16/2022 01/22/22   Corinne Ports, PA-C  Lancet Devices (ADJUSTABLE LANCING DEVICE) MISC USE TO check blood glucose 04/24/18   [provider]  polyethylene glycol (MIRALAX / GLYCOLAX) 17 g packet Take 17 g by mouth daily. Patient not taking: Reported on 03/16/2022 01/27/22   Bonnell Public, MD    Physical  Exam: Vitals:   03/18/22 1445 03/18/22 1458 03/18/22 1508 03/18/22 1523  BP: (!) 146/49  (!) 146/47 (!) 145/47  Pulse: 79  79 78  Resp: 13  16 (!) 22  Temp: 98.6 F (37 C)   99.3 F (37.4 C)  TempSrc: Oral   Oral  SpO2: 100% 100% 91% 97%  Weight:      Height:        General exam: AAOx3, thin frail, weak appearing. HEENT:Oral mucosa moist, Ear/Nose WNL grossly, dentition normal. Respiratory system: bilaterally diminished,no use of accessory muscle Cardiovascular system: S1 & S2 +, No JVD,. Gastrointestinal system: Abdomen soft, NT,ND, BS+ Nervous System:Alert, awake, moving extremities but unable  to move rle due to pain Extremities: No edema, distal peripheral pulses palpable.  Skin: No rashes,no icterus. MSK: thin muscle bulk,tone, power  Wound on heels bilaterally.  Labs on Admission: I have personally reviewed following labs and imaging studies  CBC: Recent Labs  Lab 03/18/22 1354  WBC 10.4  HGB 5.7*  HCT 18.7*  MCV 91.7  PLT [id number]   Basic Metabolic Panel: Recent Labs  Lab 03/18/22 1354  NA 138  K 4.4  CL 103  CO2 25  GLUCOSE 94  BUN 20  CREATININE 0.92  CALCIUM 8.1*   GFR: Estimated Creatinine Clearance: 30.1 mL/min (by C-G formula based on SCr of 0.92 mg/dL). Liver Function Tests: No results for input(s): "AST", "ALT", "ALKPHOS", "BILITOT", "PROT", "ALBUMIN" in the last 168 hours. No results for input(s): "LIPASE", "AMYLASE" in the last 168 hours. No results for input(s): "AMMONIA" in the last 168 hours. Coagulation Profile: No results for input(s): "INR", "PROTIME" in the last 168 hours. Cardiac Enzymes: No results for input(s): "CKTOTAL", "CKMB", "CKMBINDEX", "TROPONINI" in the last 168 hours. BNP (last 3 results) No results for input(s): "PROBNP" in the last 8760 hours. HbA1C: No results for input(s): "HGBA1C" in the last 72 hours. CBG: Recent Labs  Lab 03/18/22 1410  GLUCAP 90   Lipid Profile: No results for input(s): "CHOL", "HDL",  "LDLCALC", "TRIG", "CHOLHDL", "LDLDIRECT" in the last 72 hours. Thyroid Function Tests: No results for input(s): "TSH", "T4TOTAL", "FREET4", "T3FREE", "THYROIDAB" in the last 72 hours. Anemia Panel: No results for input(s): "VITAMINB12", "FOLATE", "FERRITIN", "TIBC", "IRON", "RETICCTPCT" in the last 72 hours. Urine analysis:    Component Value Date/Time   COLORURINE COLORLESS (A) 03/25/2019 1327   APPEARANCEUR CLEAR 03/25/2019 1327   LABSPEC 1.004 (L) 03/25/2019 1327   PHURINE 7.0 03/25/2019 1327   GLUCOSEU NEGATIVE 03/25/2019 1327   HGBUR NEGATIVE 03/25/2019 1327   BILIRUBINUR NEGATIVE 03/25/2019 1327   KETONESUR 5 (A) 03/25/2019 1327   PROTEINUR NEGATIVE 03/25/2019 1327   UROBILINOGEN 0.2 09/25/2014 1200   NITRITE NEGATIVE 03/25/2019 1327   LEUKOCYTESUR NEGATIVE 03/25/2019 1327    Radiological Exams on Admission: No results found.  Antonieta Pert MD [facility]  If 7PM-7AM, please contact night-coverage [email or web address]  03/18/2022, 3:49 PM

## 2022-03-18 NOTE — H&P (View-Only) (Signed)
Orthopaedic Trauma Service (OTS) Consultation   Patient ID: [name] MRN: [id number] DOB/AGE: 86-Oct-1928 86 y.o.   Reason for Consult: right femur fracture Referring Physician: Ledell Noss EDP  HPI: [name] is an 88 y.o. female 8 wks s/p IMN of right hip intertroch who was doing very well when she sustained a new fall with immediate pain, shortening, and malrotation. X-rays confirmed fracture at the tip of the nail. Patient also has bilateral heel pressure ulcers with foul odor on the right heel.  Past Medical History:  Diagnosis Date   Anxiety    Atrial fibrillation (HCC)    CHF (congestive heart failure) ([place])    a. EF 35-40% by echo in 12/2021   COPD (chronic obstructive pulmonary disease) ([place])    Depression    Essential hypertension    Hyperlipidemia    Hypothyroidism    Mitral regurgitation    Palpitations    PAT/EAT/NSVT, normal LVEF   Type 2 diabetes mellitus ([place])     Past Surgical History:  Procedure Laterality Date   ESOPHAGOGASTRODUODENOSCOPY N/A 04/17/2014   RMR: 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 hiatal hernia. Antral erosions status post gastric biopsy.   ESOPHAGOGASTRODUODENOSCOPY N/A 05/12/2016   Dr. Gala Romney: moderate Schatzki ring s/p dilation. moderate sized hiatal hernia   INTRAMEDULLARY (IM) NAIL INTERTROCHANTERIC Right 01/20/2022   Procedure: INTRAMEDULLARY (IM) NAIL INTERTROCHANTERIC;  Surgeon: Shona Needles, MD;  Location: [place];  Service: Orthopedics;  Laterality: Right;   MALONEY DILATION N/A 04/17/2014   Procedure: Venia Minks DILATION;  Surgeon: Daneil Dolin, MD;  [facility];  Service: Endoscopy;  Laterality: N/A;   MALONEY DILATION N/A 05/12/2016   Procedure: Venia Minks DILATION;  Surgeon: Daneil Dolin, MD;  [facility];  Service: Endoscopy;  Laterality: N/A;   none      Family History  Problem Relation Age of Onset   Heart failure  Mother        Died in her 55s   Tuberculosis Father        Died at young age   40 Brother    Arthritis Other    Colon cancer Other     Social History:  reports that she has quit smoking. She has never used smokeless tobacco. She reports that she does not drink alcohol and does not use drugs.  Allergies:  Allergies  Allergen Reactions   Chocolate Hives    Medications: Prior to Admission:  Medications Prior to Admission  Medication Sig Dispense Refill Last Dose   acetaminophen (TYLENOL) 650 MG CR tablet Take 650 mg by mouth every 6 (six) hours as needed for pain.      albuterol (VENTOLIN HFA) 108 (90 Base) MCG/ACT inhaler Inhale 1-2 puffs into the lungs every 4 (four) hours as needed for wheezing or shortness of breath. 8 g 0    ALPRAZolam (XANAX) 0.25 MG tablet Take 0.25 mg by mouth at bedtime as needed for anxiety.      amiodarone (PACERONE) 200 MG tablet Take 1 tablet (200 mg total) by mouth daily. 90 tablet 0    apixaban (ELIQUIS) 2.5 MG TABS tablet TAKE (1) TABLET TWICE DAILY. (Patient taking differently: Take 2.5 mg by mouth 2 (two) times daily.) 60 tablet 5    atorvastatin (LIPITOR) 40 MG tablet Take 40 mg by mouth daily at 6 PM.      bismuth subsalicylate (PEPTO BISMOL) 262 MG/15ML suspension  Take 30 mLs by mouth every 6 (six) hours as needed for indigestion.      Cholecalciferol (VITAMIN D3) 25 MCG (1000 UT) CAPS Take 1,000 Units by mouth daily.      COD LIVER OIL PO Take 1 capsule by mouth every 14 (fourteen) days.      feeding supplement (ENSURE ENLIVE / ENSURE PLUS) LIQD Take 237 mLs by mouth 2 (two) times daily between meals. (Patient taking differently: Take 237 mLs by mouth with breakfast, with lunch, and with evening meal.) 237 mL 12    ferrous sulfate 325 (65 FE) MG tablet Take 1 tablet (325 mg total) by mouth daily with breakfast. 30 tablet 0    furosemide (LASIX) 20 MG tablet Take 1 tablet (20 mg total) by mouth daily as needed. 30 tablet 4    levothyroxine  (SYNTHROID, LEVOTHROID) 25 MCG tablet Take 25 mcg by mouth daily before breakfast.      losartan (COZAAR) 25 MG tablet Take 1 tablet (25 mg total) by mouth daily. 30 tablet 11    metoprolol succinate (TOPROL-XL) 100 MG 24 hr tablet Take 1 tablet (100 mg total) by mouth daily. Take with or immediately following a meal. 30 tablet 0    Multiple Vitamins-Minerals (THERA-M ENHANCED PO) Take 1 tablet by mouth daily.      pantoprazole (PROTONIX) 40 MG tablet Take 1 tablet (40 mg total) by mouth daily. 28 tablet 4    senna-docusate (SENOKOT-S) 8.6-50 MG tablet Take 1 tablet by mouth 2 (two) times daily. 60 tablet 0    Wound Dressings (MEDIHONEY WOUND/BURN DRESSING) GEL Apply 1 Application topically 2 (two) times daily.      ACCU-CHEK AVIVA PLUS test strip 1 each daily.      EASY COMFORT LANCETS MISC TO test blood glucose ONCE daily      HYDROcodone-acetaminophen (NORCO/VICODIN) 5-325 MG tablet Take 1 tablet by mouth every 6 (six) hours as needed for severe pain. (Patient not taking: Reported on 03/16/2022) 10 tablet 0 Not Taking   Lancet Devices (ADJUSTABLE LANCING DEVICE) MISC USE TO check blood glucose      polyethylene glycol (MIRALAX / GLYCOLAX) 17 g packet Take 17 g by mouth daily. (Patient not taking: Reported on 03/16/2022) 14 each 0 Not Taking    Results for orders placed or performed during the hospital encounter of 03/18/22 (from the past 48 hour(s))  Type and screen [place]     Status: None (Preliminary result)   Collection Time: 03/18/22  1:54 PM  Result Value Ref Range   ABO/RH(D) PENDING    Antibody Screen PENDING    Sample Expiration      03/21/2022,2359 Performed at [facility], [place] [address] [place], [place] [address]   Glucose, capillary     Status: None   Collection Time: 03/18/22  2:10 PM  Result Value Ref Range   Glucose-Capillary 90 70 - 99 mg/dL    Comment: Glucose reference range applies only to samples taken after fasting for at least 8  hours.    No results found.  Intake/Output    None      ROS Denies recent fever, bleeding abnormalities, urologic dysfunction, GI problems, or weight gain.  Blood pressure (!) 133/47, pulse 87, temperature 98.5 F (36.9 C), temperature source Oral, resp. rate 15, height 5\' 5"  (1.651 m), weight 49.9 kg, SpO2 93 %. Physical Exam Super kind disposition, talkative, cachectic Bilat UEx shoulder, elbow, wrist, digits- no skin wounds, nontender, no instability,  no blocks to motion  Sens  Ax/R/M/U intact  Mot   Ax/ R/ PIN/ M/ AIN/ U intact  Rad 2+  RLE Immobilizer in place which ends at the level of the fracture, spasms intermittent, shortening 5"  Edema/ swelling controlled  Sens: DPN, SPN, TN intact grossly  Motor: EHL, FHL, and lessor toe ext and flex all intact grossly  Brisk cap refill, warm to touch  Heel with foul smelling eschar and drainage over the tuberosity; 4 x 4 cm but also areas of good granulation LLE   Dressing removed with pressure ulcer noted but not complete skin breakdown    Sens: DPN, SPN, TN intact grossly  Motor: EHL, FHL, and lessor toe ext and flex all intact grossly  Brisk cap refill, warm to touch       Gait: could not observe Coordination and balance: could not observe   Assessment/Plan:  Right peri-implant femoral shaft fracture--> anticipate revision to long IM nail vs overlapping plate Dr. Sharol Given has bee contacted for his expertise with foot ulcers DM Afib CHF COPD  The risks and benefits of surgery were discussed with the patient and her family, including the possibility of infection, nerve injury, vessel injury, wound breakdown, arthritis, symptomatic hardware, DVT/ PE, loss of motion, malunion, nonunion, and need for further surgery among others.  We also specifically discussed the need to stage surgery because of the elevated risk of soft tissue breakdown that could lead to amputation.  These risks were acknowledged and consent provided to  proceed.  ADDENDUM: Hgb 5.5-->given her acute blood loss anemia, patient will require 2 u PRBC and surgery delayed to tomorrow. I have spoken with my partner, Dr. Katha Hamming, who has agreed to assume management tomorrow and also coordinated care with Dr. Sharol Given who will evaluate and possibly proceed with debridement and biologic grafting if appropriate tomorrow, as well.  Dressings were changed on both heels and foam boots to float the heels applied, as well. In order to better control pain and help restore length, I also discussed with daughter Juliann Pulse and the patient the risks and benefits of skeletal traction, and they did consent to its placement.  PROCEDURE: The knee was brought into flexion with blankets for support and the traction frame apparatus set up on the bed for a vector which would pull distally and in mild hip flexion. After infiltration of the insertion site and periosteum with lidocaine laterally and medially, a k-wire was inserted through the tibia and secured with a tension bow. Because of her slight build 20 lbs of weight were applied and the pin sterilely bandaged with kerlix. There was no pressure on the skin from the bow. She experienced near immediate and profound relief. There was some but far from complete recovery of length.  Patient will proceed to the OR tomorrow for definitive treatment with Drs. Haddix and Sharol Given. Greatly appreciate their care, as well as that of the Medicine Service.Altamese [place], MD [facility], [facility] [phone]  03/18/2022, 2:13 PM  [facility] [place] [phone] Jenetta [phone] (F)    After 5pm and on the weekends please log on to Amion, go to orthopaedics and the look under the [facility] Call for the provider(s) on call. You can also call our office at [phone] and then follow the prompts to be connected to the call team.

## 2022-03-18 NOTE — Progress Notes (Signed)
Pt's surgery being rescheduled for tomorrow. Blood transfusion, 1st unit PRBC of 2 started.   Daughter, Juliann Pulse, aware of plan

## 2022-03-18 NOTE — Progress Notes (Signed)
Pt arrived via [facility] from [facility]. Report called from [facility].  Pt Alert and Oriented to self and situation. VS stable. New IV started, labs drawn. Pre-op completed with CHG wipes, oral care provided. Per Dr. Smith Robert, labs including T&S drawn. Previously on home beta blocker but did not meet parameters to give x2 days at [facility]. No oral beta blockers in pre-op, will be managed in OR per Dr. Smith Robert.   Wounds to bilateral heels, Dr. Sharol Given consulted.  Foley in place from [facility], per report received Rocephin x1 last 03/17/22 at 1414.  Dr. Marcelino Scot at bedside. Called and updated daughter, Juliann Pulse. Telephone consent obtained.

## 2022-03-19 ENCOUNTER — Inpatient Hospital Stay (HOSPITAL_COMMUNITY): Payer: Medicare Other | Admitting: Anesthesiology

## 2022-03-19 ENCOUNTER — Encounter (HOSPITAL_COMMUNITY)
Admission: RE | Disposition: A | Discharge status: Skilled Nursing Facility | Payer: Self-pay | Source: Ambulatory Visit | Attending: Internal Medicine

## 2022-03-19 ENCOUNTER — Inpatient Hospital Stay (HOSPITAL_COMMUNITY): Payer: Medicare Other

## 2022-03-19 ENCOUNTER — Other Ambulatory Visit: Payer: Self-pay

## 2022-03-19 DIAGNOSIS — S72351A Displaced comminuted fracture of shaft of right femur, initial encounter for closed fracture: Secondary | ICD-10-CM | POA: Diagnosis not present

## 2022-03-19 DIAGNOSIS — J449 Chronic obstructive pulmonary disease, unspecified: Secondary | ICD-10-CM

## 2022-03-19 DIAGNOSIS — F418 Other specified anxiety disorders: Secondary | ICD-10-CM

## 2022-03-19 DIAGNOSIS — L97414 Non-pressure chronic ulcer of right heel and midfoot with necrosis of bone: Secondary | ICD-10-CM

## 2022-03-19 DIAGNOSIS — S7291XA Unspecified fracture of right femur, initial encounter for closed fracture: Secondary | ICD-10-CM

## 2022-03-19 DIAGNOSIS — Z87891 Personal history of nicotine dependence: Secondary | ICD-10-CM

## 2022-03-19 HISTORY — PX: INTRAMEDULLARY (IM) NAIL INTERTROCHANTERIC: SHX5875

## 2022-03-19 HISTORY — PX: IRRIGATION AND DEBRIDEMENT FOOT: SHX6602

## 2022-03-19 HISTORY — PX: APPLICATION OF WOUND VAC: SHX5189

## 2022-03-19 LAB — BASIC METABOLIC PANEL
Anion gap: 5 (ref 5–15)
BUN: 21 mg/dL (ref 8–23)
CO2: 25 mmol/L (ref 22–32)
Calcium: 7.8 mg/dL — ABNORMAL LOW (ref 8.9–10.3)
Chloride: 107 mmol/L (ref 98–111)
Creatinine, Ser: 0.89 mg/dL (ref 0.44–1.00)
GFR, Estimated: >60 mL/min (ref >60)
Glucose, Bld: 120 mg/dL — ABNORMAL HIGH (ref 70–99)
Potassium: 4.3 mmol/L (ref 3.5–5.1)
Sodium: 137 mmol/L (ref 135–145)

## 2022-03-19 LAB — CBC
HCT: 27.4 % — ABNORMAL LOW (ref 36.0–46.0)
Hemoglobin: 8.7 g/dL — ABNORMAL LOW (ref 12.0–15.0)
MCH: 27.8 pg (ref 26.0–34.0)
MCHC: 31.8 g/dL (ref 30.0–36.0)
MCV: 87.5 fL (ref 80.0–100.0)
Platelets: 178 10*3/uL (ref 150–400)
RBC: 3.13 MIL/uL — ABNORMAL LOW (ref 3.87–5.11)
RDW: 15.4 % (ref 11.5–15.5)
WBC: 8.8 10*3/uL (ref 4.0–10.5)
nRBC: 0 % (ref 0.0–0.2)

## 2022-03-19 LAB — HEMOGLOBIN A1C
Hgb A1c MFr Bld: 5.4 % (ref 4.8–5.6)
Mean Plasma Glucose: 108.28 mg/dL

## 2022-03-19 LAB — GLUCOSE, CAPILLARY
Glucose-Capillary: 74 mg/dL (ref 70–99)
Glucose-Capillary: 105 mg/dL — ABNORMAL HIGH (ref 70–99)
Glucose-Capillary: 241 mg/dL — ABNORMAL HIGH (ref 70–99)
Glucose-Capillary: 280 mg/dL — ABNORMAL HIGH (ref 70–99)

## 2022-03-19 LAB — VITAMIN D 25 HYDROXY (VIT D DEFICIENCY, FRACTURES): Vit D, 25-Hydroxy: 22.92 ng/mL — ABNORMAL LOW (ref 30–100)

## 2022-03-19 SURGERY — FIXATION, FRACTURE, INTERTROCHANTERIC, WITH INTRAMEDULLARY ROD
Anesthesia: General | Site: Heel | Laterality: Right

## 2022-03-19 MED ORDER — SODIUM CHLORIDE 0.9 % IV SOLN: INTRAVENOUS | Status: DC

## 2022-03-19 MED ORDER — 0.9 % SODIUM CHLORIDE (POUR BTL) OPTIME
TOPICAL | Status: DC | PRN
  Administered 2022-03-19: 1000 mL

## 2022-03-19 MED ORDER — VANCOMYCIN HCL 1000 MG IV SOLR
INTRAVENOUS | Status: AC
  Filled 2022-03-19: qty 20

## 2022-03-19 MED ORDER — CHLORHEXIDINE GLUCONATE 0.12 % MT SOLN: 15.0000 mL | Freq: Once | OROMUCOSAL | Status: AC

## 2022-03-19 MED ORDER — CEFAZOLIN SODIUM-DEXTROSE 2-4 GM/100ML-% IV SOLN
2.0000 g | Freq: Three times a day (TID) | INTRAVENOUS | Status: AC
  Administered 2022-03-19 – 2022-03-20 (×3): 2 g via INTRAVENOUS
  Filled 2022-03-19 (×3): qty 100

## 2022-03-19 MED ORDER — PROPOFOL 10 MG/ML IV BOLUS
INTRAVENOUS | Status: AC
  Filled 2022-03-19: qty 20

## 2022-03-19 MED ORDER — FENTANYL CITRATE (PF) 250 MCG/5ML IJ SOLN
INTRAMUSCULAR | Status: DC | PRN
  Administered 2022-03-19: 50 ug via INTRAVENOUS

## 2022-03-19 MED ORDER — METOCLOPRAMIDE HCL 5 MG/ML IJ SOLN: 5.0000 mg | Freq: Three times a day (TID) | INTRAMUSCULAR | Status: DC | PRN

## 2022-03-19 MED ORDER — CHLORHEXIDINE GLUCONATE 0.12 % MT SOLN
OROMUCOSAL | Status: AC
  Administered 2022-03-19: 15 mL via OROMUCOSAL
  Filled 2022-03-19: qty 15

## 2022-03-19 MED ORDER — DOCUSATE SODIUM 100 MG PO CAPS
100.0000 mg | ORAL_CAPSULE | Freq: Two times a day (BID) | ORAL | Status: DC
  Administered 2022-03-19 – 2022-03-23 (×7): 100 mg via ORAL
  Filled 2022-03-19 (×10): qty 1

## 2022-03-19 MED ORDER — FENTANYL CITRATE (PF) 250 MCG/5ML IJ SOLN
INTRAMUSCULAR | Status: AC
  Filled 2022-03-19: qty 5

## 2022-03-19 MED ORDER — ACETAMINOPHEN 500 MG PO TABS
ORAL_TABLET | ORAL | Status: AC
  Filled 2022-03-19: qty 2

## 2022-03-19 MED ORDER — PHENYLEPHRINE HCL-NACL 20-0.9 MG/250ML-% IV SOLN
INTRAVENOUS | Status: DC | PRN
  Administered 2022-03-19: 25 ug/min via INTRAVENOUS

## 2022-03-19 MED ORDER — FENTANYL CITRATE (PF) 100 MCG/2ML IJ SOLN: 25.0000 ug | INTRAMUSCULAR | Status: DC | PRN

## 2022-03-19 MED ORDER — ORAL CARE MOUTH RINSE: 15.0000 mL | Freq: Once | OROMUCOSAL | Status: AC

## 2022-03-19 MED ORDER — SUGAMMADEX SODIUM 200 MG/2ML IV SOLN
INTRAVENOUS | Status: DC | PRN
  Administered 2022-03-19 (×2): 100 mg via INTRAVENOUS

## 2022-03-19 MED ORDER — INSULIN ASPART 100 UNIT/ML IJ SOLN
0.0000 [IU] | Freq: Three times a day (TID) | INTRAMUSCULAR | Status: DC
  Administered 2022-03-19: 2 [IU] via SUBCUTANEOUS
  Administered 2022-03-20 – 2022-03-23 (×2): 1 [IU] via SUBCUTANEOUS

## 2022-03-19 MED ORDER — METHOCARBAMOL 500 MG PO TABS
500.0000 mg | ORAL_TABLET | Freq: Four times a day (QID) | ORAL | Status: DC | PRN
  Filled 2022-03-19: qty 1

## 2022-03-19 MED ORDER — METOCLOPRAMIDE HCL 5 MG PO TABS: 5.0000 mg | ORAL_TABLET | Freq: Three times a day (TID) | ORAL | Status: DC | PRN

## 2022-03-19 MED ORDER — LIDOCAINE 2% (20 MG/ML) 5 ML SYRINGE
INTRAMUSCULAR | Status: DC | PRN
  Administered 2022-03-19: 20 mg via INTRAVENOUS

## 2022-03-19 MED ORDER — VITAMIN D (ERGOCALCIFEROL) 1.25 MG (50000 UNIT) PO CAPS
50000.0000 [IU] | ORAL_CAPSULE | ORAL | Status: DC
  Administered 2022-03-19: 50000 [IU] via ORAL
  Filled 2022-03-19: qty 1

## 2022-03-19 MED ORDER — MORPHINE SULFATE (PF) 2 MG/ML IV SOLN
0.5000 mg | INTRAVENOUS | Status: DC | PRN
  Administered 2022-03-19: 0.5 mg via INTRAVENOUS
  Filled 2022-03-19: qty 1

## 2022-03-19 MED ORDER — ACETAMINOPHEN 500 MG PO TABS: 1000.0000 mg | ORAL_TABLET | Freq: Once | ORAL | Status: DC

## 2022-03-19 MED ORDER — CEFAZOLIN SODIUM-DEXTROSE 2-4 GM/100ML-% IV SOLN
INTRAVENOUS | Status: AC
  Filled 2022-03-19: qty 100

## 2022-03-19 MED ORDER — ONDANSETRON HCL 4 MG/2ML IJ SOLN
INTRAMUSCULAR | Status: DC | PRN
  Administered 2022-03-19: 4 mg via INTRAVENOUS

## 2022-03-19 MED ORDER — TRANEXAMIC ACID-NACL 1000-0.7 MG/100ML-% IV SOLN
INTRAVENOUS | Status: AC
  Filled 2022-03-19: qty 100

## 2022-03-19 MED ORDER — PROPOFOL 10 MG/ML IV BOLUS
INTRAVENOUS | Status: DC | PRN
  Administered 2022-03-19: 50 mg via INTRAVENOUS

## 2022-03-19 MED ORDER — POLYETHYLENE GLYCOL 3350 17 G PO PACK: 17.0000 g | PACK | Freq: Every day | ORAL | Status: DC | PRN

## 2022-03-19 MED ORDER — TRANEXAMIC ACID-NACL 1000-0.7 MG/100ML-% IV SOLN
INTRAVENOUS | Status: DC | PRN
  Administered 2022-03-19: 1000 mg via INTRAVENOUS

## 2022-03-19 MED ORDER — LACTATED RINGERS IV SOLN: INTRAVENOUS | Status: DC

## 2022-03-19 MED ORDER — ROCURONIUM BROMIDE 10 MG/ML (PF) SYRINGE
PREFILLED_SYRINGE | INTRAVENOUS | Status: DC | PRN
  Administered 2022-03-19: 20 mg via INTRAVENOUS
  Administered 2022-03-19: 30 mg via INTRAVENOUS

## 2022-03-19 MED ORDER — CEFAZOLIN SODIUM-DEXTROSE 2-3 GM-%(50ML) IV SOLR
INTRAVENOUS | Status: DC | PRN
  Administered 2022-03-19: 2 g via INTRAVENOUS

## 2022-03-19 MED ORDER — TRANEXAMIC ACID-NACL 1000-0.7 MG/100ML-% IV SOLN
1000.0000 mg | Freq: Once | INTRAVENOUS | Status: AC
  Administered 2022-03-19: 1000 mg via INTRAVENOUS
  Filled 2022-03-19: qty 100

## 2022-03-19 MED ORDER — METOPROLOL SUCCINATE ER 25 MG PO TB24
ORAL_TABLET | ORAL | Status: AC
  Filled 2022-03-19: qty 2

## 2022-03-19 MED ORDER — HYDROCODONE-ACETAMINOPHEN 5-325 MG PO TABS
1.0000 | ORAL_TABLET | ORAL | Status: DC | PRN
  Administered 2022-03-20 – 2022-03-22 (×2): 1 via ORAL
  Filled 2022-03-19 (×2): qty 1

## 2022-03-19 MED ORDER — METHOCARBAMOL 1000 MG/10ML IJ SOLN
500.0000 mg | Freq: Four times a day (QID) | INTRAVENOUS | Status: DC | PRN
  Filled 2022-03-19: qty 5

## 2022-03-19 MED ORDER — PHENYLEPHRINE 80 MCG/ML (10ML) SYRINGE FOR IV PUSH (FOR BLOOD PRESSURE SUPPORT)
PREFILLED_SYRINGE | INTRAVENOUS | Status: DC | PRN
  Administered 2022-03-19 (×3): 80 ug via INTRAVENOUS
  Administered 2022-03-19 (×2): 40 ug via INTRAVENOUS
  Administered 2022-03-19: 80 ug via INTRAVENOUS

## 2022-03-19 MED ORDER — DEXAMETHASONE SODIUM PHOSPHATE 10 MG/ML IJ SOLN
INTRAMUSCULAR | Status: DC | PRN
  Administered 2022-03-19: 8 mg via INTRAVENOUS

## 2022-03-19 SURGICAL SUPPLY — 72 items
BAG COUNTER SPONGE SURGICOUNT (BAG) ×2 IMPLANT
BIT DRILL 4.3 (BIT) ×2
BIT DRILL 4.3X300MM (BIT) IMPLANT
BIT DRILL LONG 3.3 (BIT) IMPLANT
BIT DRILL QC 3.3X195 (BIT) IMPLANT
BNDG COHESIVE 4X5 TAN STRL LF (GAUZE/BANDAGES/DRESSINGS) IMPLANT
BNDG COHESIVE 6X5 TAN STRL LF (GAUZE/BANDAGES/DRESSINGS) IMPLANT
BRUSH SCRUB EZ PLAIN DRY (MISCELLANEOUS) ×4 IMPLANT
CABLE CERLAGE W/CRIMP 1.8 (Cable) IMPLANT
CABLE READY CERCLAGE W/CRIP (Trauma Fixation) IMPLANT
CANISTER WOUND CARE 500ML ATS (WOUND CARE) IMPLANT
CAP LOCK NCB (Cap) IMPLANT
COVER PERINEAL POST (MISCELLANEOUS) ×2 IMPLANT
COVER SURGICAL LIGHT HANDLE (MISCELLANEOUS) ×4 IMPLANT
DERMABOND ADVANCED .7 DNX12 (GAUZE/BANDAGES/DRESSINGS) IMPLANT
DRAPE C-ARMOR (DRAPES) ×2 IMPLANT
DRAPE DERMATAC (DRAPES) IMPLANT
DRAPE HALF SHEET 40X57 (DRAPES) IMPLANT
DRAPE INCISE IOBAN 66X45 STRL (DRAPES) ×2 IMPLANT
DRAPE ORTHO SPLIT 77X108 STRL (DRAPES) ×4
DRAPE SURG ORHT 6 SPLT 77X108 (DRAPES) IMPLANT
DRAPE U-SHAPE 47X51 STRL (DRAPES) ×2 IMPLANT
DRESSING MEPILEX FLEX 4X4 (GAUZE/BANDAGES/DRESSINGS) IMPLANT
DRESSING VERAFLO CLEANS CC MED (GAUZE/BANDAGES/DRESSINGS) IMPLANT
DRSG EMULSION OIL 3X3 NADH (GAUZE/BANDAGES/DRESSINGS) ×2 IMPLANT
DRSG MEPILEX BORDER 4X4 (GAUZE/BANDAGES/DRESSINGS) ×2 IMPLANT
DRSG MEPILEX BORDER 4X8 (GAUZE/BANDAGES/DRESSINGS) ×2 IMPLANT
DRSG MEPILEX FLEX 4X4 (GAUZE/BANDAGES/DRESSINGS) ×2
DRSG MEPILEX POST OP 4X12 (GAUZE/BANDAGES/DRESSINGS) IMPLANT
DRSG VERAFLO CLEANSE CC MED (GAUZE/BANDAGES/DRESSINGS) ×2
ELECT REM PT RETURN 9FT ADLT (ELECTROSURGICAL) ×2
ELECTRODE REM PT RTRN 9FT ADLT (ELECTROSURGICAL) ×2 IMPLANT
GLOVE BIO SURGEON STRL SZ7.5 (GLOVE) ×2 IMPLANT
GLOVE BIO SURGEON STRL SZ8 (GLOVE) ×2 IMPLANT
GLOVE BIOGEL PI IND STRL 7.5 (GLOVE) ×2 IMPLANT
GLOVE BIOGEL PI IND STRL 8 (GLOVE) ×2 IMPLANT
GLOVE SURG ORTHO LTX SZ7.5 (GLOVE) ×4 IMPLANT
GOWN STRL REUS W/ TWL LRG LVL3 (GOWN DISPOSABLE) ×4 IMPLANT
GOWN STRL REUS W/ TWL XL LVL3 (GOWN DISPOSABLE) ×2 IMPLANT
GOWN STRL REUS W/TWL LRG LVL3 (GOWN DISPOSABLE) ×6
GOWN STRL REUS W/TWL XL LVL3 (GOWN DISPOSABLE)
GRAFT SKIN WND MICRO 38 (Tissue) IMPLANT
GRAFT SKIN WND SURGIBIND 3X7 (Tissue) IMPLANT
K-WIRE 2.0 (WIRE) ×2
K-WIRE FXSTD 280X2XNS SS (WIRE) ×2
KIT BASIN OR (CUSTOM PROCEDURE TRAY) ×2 IMPLANT
KIT TURNOVER KIT B (KITS) ×2 IMPLANT
KWIRE FXSTD 280X2XNS SS (WIRE) IMPLANT
MANIFOLD NEPTUNE II (INSTRUMENTS) ×2 IMPLANT
NS IRRIG 1000ML POUR BTL (IV SOLUTION) ×2 IMPLANT
PACK GENERAL/GYN (CUSTOM PROCEDURE TRAY) ×2 IMPLANT
PAD ARMBOARD 7.5X6 YLW CONV (MISCELLANEOUS) ×4 IMPLANT
PAD NEG PRESSURE SENSATRAC (MISCELLANEOUS) IMPLANT
PLATE PROXIMAL FEMUR 12H RT (Plate) IMPLANT
SCREW CORT NCB SELFTAP 5.0X42 (Screw) IMPLANT
SCREW CORTICAL NCB 5.0X40 (Screw) IMPLANT
SCREW NCB 4.0MX34M (Screw) IMPLANT
SCREW NCB 4.0MX42M (Screw) ×2 IMPLANT
SCREW NCB 4.0X40MM (Screw) IMPLANT
SCREW NCB 5.0X38 (Screw) IMPLANT
STAPLER VISISTAT 35W (STAPLE) ×2 IMPLANT
STOCKINETTE IMPERVIOUS LG (DRAPES) IMPLANT
SUT ETHILON 2 0 FS 18 (SUTURE) ×2 IMPLANT
SUT VIC AB 0 CT1 27 (SUTURE) ×2
SUT VIC AB 0 CT1 27XBRD ANBCTR (SUTURE) ×2 IMPLANT
SUT VIC AB 1 CT1 27 (SUTURE) ×2
SUT VIC AB 1 CT1 27XBRD ANBCTR (SUTURE) ×2 IMPLANT
SUT VIC AB 2-0 CT1 27 (SUTURE) ×2
SUT VIC AB 2-0 CT1 TAPERPNT 27 (SUTURE) ×2 IMPLANT
TOWEL GREEN STERILE (TOWEL DISPOSABLE) ×4 IMPLANT
TOWEL GREEN STERILE FF (TOWEL DISPOSABLE) ×2 IMPLANT
WATER STERILE IRR 1000ML POUR (IV SOLUTION) ×2 IMPLANT

## 2022-03-19 NOTE — Plan of Care (Signed)
  Problem: Education: Goal: Knowledge of General Education information will improve Description: Including pain rating scale, medication(s)/side effects and non-pharmacologic comfort measures Outcome: Progressing   Problem: Health Behavior/Discharge Planning: Goal: Ability to manage health-related needs will improve Outcome: Progressing   Problem: Clinical Measurements: Goal: Ability to maintain clinical measurements within normal limits will improve Outcome: Progressing Goal: Will remain free from infection Outcome: Progressing Goal: Diagnostic test results will improve Outcome: Progressing Goal: Respiratory complications will improve Outcome: Progressing Goal: Cardiovascular complication will be avoided Outcome: Progressing   Problem: Activity: Goal: Risk for activity intolerance will decrease Outcome: Progressing   Problem: Nutrition: Goal: Adequate nutrition will be maintained Outcome: Progressing   Problem: Coping: Goal: Level of anxiety will decrease Outcome: Progressing   Problem: Elimination: Goal: Will not experience complications related to bowel motility Outcome: Progressing Goal: Will not experience complications related to urinary retention Outcome: Progressing   Problem: Pain Managment: Goal: General experience of comfort will improve Outcome: Progressing   Problem: Safety: Goal: Ability to remain free from injury will improve Outcome: Progressing   Problem: Skin Integrity: Goal: Risk for impaired skin integrity will decrease Outcome: Progressing   Problem: Education: Goal: Ability to demonstrate management of disease process will improve Outcome: Progressing Goal: Ability to verbalize understanding of medication therapies will improve Outcome: Progressing Goal: Individualized Educational Video(s) Outcome: Progressing   Problem: Activity: Goal: Capacity to carry out activities will improve Outcome: Progressing   Problem: Cardiac: Goal:  Ability to achieve and maintain adequate cardiopulmonary perfusion will improve Outcome: Progressing   Problem: Education: Goal: Knowledge of disease or condition will improve Outcome: Progressing Goal: Knowledge of the prescribed therapeutic regimen will improve Outcome: Progressing Goal: Individualized Educational Video(s) Outcome: Progressing   Problem: Activity: Goal: Ability to tolerate increased activity will improve Outcome: Progressing Goal: Will verbalize the importance of balancing activity with adequate rest periods Outcome: Progressing   Problem: Respiratory: Goal: Ability to maintain a clear airway will improve Outcome: Progressing Goal: Levels of oxygenation will improve Outcome: Progressing Goal: Ability to maintain adequate ventilation will improve Outcome: Progressing   Problem: Education: Goal: Ability to describe self-care measures that may prevent or decrease complications (Diabetes Survival Skills Education) will improve Outcome: Progressing Goal: Individualized Educational Video(s) Outcome: Progressing   Problem: Coping: Goal: Ability to adjust to condition or change in health will improve Outcome: Progressing   Problem: Fluid Volume: Goal: Ability to maintain a balanced intake and output will improve Outcome: Progressing   Problem: Health Behavior/Discharge Planning: Goal: Ability to identify and utilize available resources and services will improve Outcome: Progressing Goal: Ability to manage health-related needs will improve Outcome: Progressing   Problem: Metabolic: Goal: Ability to maintain appropriate glucose levels will improve Outcome: Progressing   Problem: Nutritional: Goal: Maintenance of adequate nutrition will improve Outcome: Progressing Goal: Progress toward achieving an optimal weight will improve Outcome: Progressing   Problem: Skin Integrity: Goal: Risk for impaired skin integrity will decrease Outcome: Progressing    Problem: Tissue Perfusion: Goal: Adequacy of tissue perfusion will improve Outcome: Progressing   

## 2022-03-19 NOTE — Interval H&P Note (Signed)
History and Physical Interval Note:  03/19/2022 7:30 AM  [name]  has presented today for surgery, with the diagnosis of RIGHT FEMUR FRACTURE.  The various methods of treatment have been discussed with the patient and family. After consideration of risks, benefits and other options for treatment, the patient has consented to  OPEN REDUCTION INTERNAL FIXATION OF RIGHT FEMUR as a surgical intervention.  The patient's history has been reviewed, patient examined, no change in status, stable for surgery.  I have reviewed the patient's chart and labs.  Questions were answered to the patient's satisfaction.     Lennette Bihari P Dannisha Eckmann

## 2022-03-19 NOTE — Op Note (Signed)
03/19/2022  10:21 AM  PATIENT:  [name]    PRE-OPERATIVE DIAGNOSIS: Necrotic right heel decubitus ulcer  POST-OPERATIVE DIAGNOSIS:  Same  PROCEDURE: Excisional debridement of skin and soft tissue muscle and fascia right heel with application of Kerecis micro powder 38 cm and Kerecis sheet 3 x 7 cm.  Application of cleanse choice wound VAC sponge  SURGEON:  Newt Minion, MD  PHYSICIAN ASSISTANT:None ANESTHESIA:   General  PREOPERATIVE INDICATIONS:  [name] is a  88 y.o. female with a diagnosis of RIGHT FEMUR FRACTURE who failed conservative measures and elected for surgical management.    The risks benefits and alternatives were discussed with the patient preoperatively including but not limited to the risks of infection, bleeding, nerve injury, cardiopulmonary complications, the need for revision surgery, among others, and the patient was willing to proceed.  OPERATIVE IMPLANTS: Kerecis micro powder 38 cm and Kerecis sheet 3 x 7 cm.  @ENCIMAGES @  OPERATIVE FINDINGS: Patient had necrotic soft tissue that extended down to bone.  OPERATIVE PROCEDURE: Patient was seen at the completion of the open reduction internal fixation of the right femur fracture surgery.  A timeout was called the right lower extremity was already prepped and draped into a sterile field.  A 21 blade knife was used to excise skin and soft tissue muscle and fascia down to bone.  There was good petechial bleeding.  The wound was irrigated with normal saline.  The wound was covered with Kerecis micro powder 38 cm this was then covered with a 3 x 7 Kerecis sheet a cleanse choice wound VAC was applied covered with derma tack this had a good suction fit this was overwrapped with Coban.  Patient was extubated taken the PACU in stable condition.   DISCHARGE PLANNING:  Antibiotic duration: Continue antibiotics as per fracture surgery  Weightbearing: Use blue boots for both lower extremities at all  times.  Pain medication: As per ORIF of femur  Dressing care/ Wound VAC: Continue wound VAC at discharge   Follow-up: In the office 1 week post operative.

## 2022-03-19 NOTE — Transfer of Care (Signed)
Immediate Anesthesia Transfer of Care Note  Patient: [name]  Procedure(s) Performed: OPEN REDUCTION INTERNAL FIXATION RIGHT FEMUR FRACTURE (Right) IRRIGATION AND DEBRIDEMENT FOOT (Right: Heel) APPLICATION OF WOUND VAC (Right: Heel)  Patient Location: PACU  Anesthesia Type:General  Level of Consciousness: awake, drowsy and patient cooperative  Airway & Oxygen Therapy: Patient Spontanous Breathing and Patient connected to face mask oxygen  Post-op Assessment: Report given to RN and Post -op Vital signs reviewed and stable  Post vital signs: Reviewed and stable  Last Vitals:  Vitals Value Taken Time  BP 154/59 03/19/22 0950  Temp    Pulse 68 03/19/22 0955  Resp 17 03/19/22 0955  SpO2 100 % 03/19/22 0955  Vitals shown include unvalidated device data.  Last Pain:  Vitals:   03/19/22 0701  TempSrc: Axillary  PainSc:       Patients Stated Pain Goal: 3 (71/06/26 9485)  Complications: No notable events documented.

## 2022-03-19 NOTE — Progress Notes (Signed)
PROGRESS NOTE [name]  [id number] DOB: 02/23/1927 DOA: 03/18/2022 PCP: Leslie Andrea, MD   Brief Narrative/Hospital Course: 88 y.o. female with medical history significant for Persistent A FIB/a Flutter previously on amiodarone, hypertension, severe MR/moderate to severe TR, pulmonary hypertension, LBBB B, DM-diet controlled, COPD recent hospitalization 8/28-01/26/2022 for right hip fracture with A-fib RVR/acute systolic CHF with EF [id number], postop anemia who had sustained a new fall at home and right femur diaphysis fracture, no syncope or loss of consciousness reported.  In the ED vital signs showed elevated blood pressure 190/80 hemoglobin 8.5 g, rest of your lites were normal, Dr. Doreatha Martin at [facility] was consulted and agreed with ED physician therefore [facility] transfer for ORIF.  Bed had opened up 10/25 but apparently patient was not n.p.o. surgery canceled, patient got a bed 03/17/22 but she was not n.p.o. so surgery was canceled. Patient arrived to short stay where routine labs showed hemoglobin 5.7 g, 2 units PRBC ordered surgery canceled and admission was requested. In [facility] [place] apparently give rocephin x1 10/26 for ?uti and has foley in place.She is hard of hearing. Patient is admitted and orthopedic podiatry consulted    Subjective: Seen and examined this morning daughter and family at the bedside patient is alert awake cheerful. Patient underwent open reduction internal fixation of right periprosthetic femur fracture and necrotic right foot decubitus ulcer debridement and VAC application  Assessment and Plan: Principal Problem:   Femur fracture, right ([place]) Active Problems:   Benign essential HTN   Iron deficiency anemia   Closed fracture of right hip (HCC)   Pressure injury of skin   Heel ulcer, right, with necrosis of bone (HCC)   Acute blood loss anemia in the setting of chronic anemia Iron deficiency anemia: Acute drop in hemoglobin likely multifactorial in the  setting of chronic anemia iron deficiency anemia, recent acute blood loss anemia in the setting of hip fracture and surgery.  After 2 units PRBC hemoglobin appropriately increased.  Monitor.  Recent Labs  Lab 03/18/22 1354 03/19/22 0110  HGB 5.7* 8.7*  HCT 18.7* 27.4*     Right periprosthetic femur fracture s/p fall:S/p ORIF right periprosthetic femur fracture Dr Doreatha Martin.  Continue postop care pain control PT OT as per orthopedics.  Necrotic right foot decubitus ulcer: s/p debridement and VAC application Dr Sharol Given.   Vitamind D deficienc: replacement started the high-dose weekly.  Recent systolic CHF with EF [id number] MR/TR Pulm hypertension: Blood limits 20 stable.  Chest x-ray with patchy opacities in the RLL add incentive spirometry.  Continue Lasix as needed, continue home meds with amio, metoprolol at 50 mg w/ holding parameters.   Paroxysmal A-fib, on Eliquis at home- on hold preop> resume once okay with surgery. Cont metoprololo, amio, monitor in tele   COPD/former smoker continue inhaler- not on o2, has diminished BS. Cont prn nebs, inhalers, [place] as needed   Anxiety/depression-mood stable   Diet controlled diabetes: Diet controlled   Moderate malnutrition:Body mass index is 18.31 kg/m. [place] dietitian Dietitian consulted.  Pressure injury sacrum stage II POA continue wound care foam dressing  DVT prophylaxis: SCDs Start: 03/19/22 1131 SCDs Start: 03/18/22 1548 Code Status:   Code Status: DNR Family Communication: plan of care discussed with patient/daughter at bedside. Patient status is: Inpatient because of hip fracture Level of care: Med-Surg   Dispo: The patient is from: Home            Anticipated disposition: To nursing facility  Mobility Assessment (last 72 hours)  Mobility Assessment     Row Name 03/18/22 1630           Does patient have an order for bedrest or is patient medically unstable Yes- Bedfast (Level 1) - Complete       What is the highest  level of mobility based on the progressive mobility assessment? Level 1 (Bedfast) - Unable to balance while sitting on edge of bed       Is the above level different from baseline mobility prior to current illness? Yes - Recommend PT order               Objective: Vitals last 24 hrs: Vitals:   03/19/22 0952 03/19/22 1000 03/19/22 1015 03/19/22 1030  BP: (!) 154/59 (!) 159/51 (!) 165/54 (!) 169/59  Pulse: 71 68 68 70  Resp: 19 18 19  (!) 21  Temp: 97.8 F (36.6 C)  97.8 F (36.6 C)   TempSrc:      SpO2: 100% 100% 100% 100%  Weight:      Height:       Weight change:   Physical Examination: General exam: alert awake, older than stated age HEENT:Oral mucosa moist, Ear/Nose WNL grossly Respiratory system: bilaterally clear BS, no use of accessory muscle Cardiovascular system: S1 & S2 +, No JVD. Gastrointestinal system: Abdomen soft,NT,ND, BS+ Nervous System:Alert, awake, moving extremities. Extremities: Right hip surgical site with dressing in place  Skin: No rashes,no icterus. MSK: Normal muscle bulk,tone, power Wound VAC on the right foot,  Medications reviewed:  Scheduled Meds:  acetaminophen  650 mg Oral Q8H   amiodarone  200 mg Oral Daily   atorvastatin  40 mg Oral q1800   cholecalciferol  1,000 Units Oral Daily   docusate sodium  100 mg Oral BID   ferrous sulfate  325 mg Oral Q breakfast   levothyroxine  25 mcg Oral QAC breakfast   metoprolol succinate  50 mg Oral Q breakfast   pantoprazole  40 mg Oral Daily   senna-docusate  1 tablet Oral BID   umeclidinium bromide  1 puff Inhalation Daily   Continuous Infusions:  sodium chloride     ceFAZolin      ceFAZolin (ANCEF) IV     methocarbamol (ROBAXIN) IV     tranexamic acid     Diet Order             Diet Heart Room service appropriate? Yes; Fluid consistency: Thin  Diet effective now                  Intake/Output Summary (Last 24 hours) at 03/19/2022 1146 Last data filed at 03/19/2022 0946 Gross  per 24 hour  Intake 1612.5 ml  Output 520 ml  Net 1092.5 ml   Net IO Since Admission: 1,092.5 mL [03/19/22 1146]  Wt Readings from Last 3 Encounters:  03/18/22 49.9 kg  02/23/22 49.9 kg  01/26/22 50.6 kg     Unresulted Labs (From admission, onward)     Start     Ordered   03/20/22 0500  CBC  Daily at 5am,   R     Question:  Specimen collection method  Answer:  Lab=Lab collect   03/19/22 1130   03/20/22 6578  Basic metabolic panel  Daily at 5am,   R     Question:  Specimen collection method  Answer:  Lab=Lab collect   03/19/22 1130   03/19/22 4696  Basic metabolic panel  Daily at 5am,   R  03/18/22 1548   03/19/22 0500  CBC  Daily at 5am,   R      03/18/22 1548   03/18/22 1548  Urinalysis, Routine w reflex microscopic  Once,   R        03/18/22 1548          Data Reviewed: I have personally reviewed following labs and imaging studies CBC: Recent Labs  Lab 03/18/22 1354 03/19/22 0110  WBC 10.4 8.8  HGB 5.7* 8.7*  HCT 18.7* 27.4*  MCV 91.7 87.5  PLT 216 0000000   Basic Metabolic Panel: Recent Labs  Lab 03/18/22 1354 03/19/22 0110  NA 138 137  K 4.4 4.3  CL 103 107  CO2 25 25  GLUCOSE 94 120*  BUN 20 21  CREATININE 0.92 0.89  CALCIUM 8.1* 7.8*  CBG: Recent Labs  Lab 03/18/22 1410 03/18/22 1628 03/19/22 0738 03/19/22 0952  GLUCAP 90 82 74 105*   Antimicrobials: Anti-infectives (From admission, onward)    Start     Dose/Rate Route Frequency Ordered Stop   03/19/22 1600  ceFAZolin (ANCEF) IVPB 2g/100 mL premix        2 g 200 mL/hr over 30 Minutes Intravenous Every 8 hours 03/19/22 1130 03/20/22 1559   03/19/22 0737  ceFAZolin (ANCEF) 2-4 GM/100ML-% IVPB       Note to Pharmacy: Tamala Fothergill S: cabinet override      03/19/22 0737 03/19/22 1944   03/18/22 1415  piperacillin-tazobactam (ZOSYN) IVPB 3.375 g  Status:  Discontinued        3.375 g 100 mL/hr over 30 Minutes Intravenous  Once 03/18/22 1401 03/18/22 1644       Culture/Microbiology No results found for: "SDES", "SPECREQUEST", "CULT", "REPTSTATUS"  Other culture-see note  Radiology Studies: DG FEMUR PORT, MIN 2 VIEWS RIGHT  Result Date: 03/19/2022 CLINICAL DATA:  Right femur ORIF. EXAM: RIGHT FEMUR PORTABLE 2 VIEW COMPARISON:  CT right femur dated March 17, 2022. FINDINGS: New lateral plate and screw fixation of the oblique periprosthetic femoral diaphyseal fracture, now in near anatomic alignment. A long anterior diaphyseal cortical fragment remains slightly displaced anteriorly. No evidence of hardware failure or loosening. Unchanged healing right intertrochanteric femur fracture status post gamma nail fixation. Expected small volume subcutaneous emphysema in the right thigh. IMPRESSION: 1. Interval ORIF of the periprosthetic right femoral diaphyseal fracture, now in near anatomic alignment. No hardware complication. Electronically Signed   By: Titus Dubin M.D.   On: 03/19/2022 10:36   DG FEMUR, MIN 2 VIEWS RIGHT  Result Date: 03/19/2022 CLINICAL DATA:  Surgical internal fixation of right femoral fracture. EXAM: RIGHT FEMUR 2 VIEWS; DG C-ARM 1-60 MIN-NO REPORT Radiation exposure index: 6.82 mGy. COMPARISON:  March 15, 2022. FINDINGS: Ten intraoperative fluoroscopic images were obtained of the right femur. These images demonstrate surgical internal fixation of proximal right femoral shaft fracture. Previous surgical internal fixation of proximal right femur is again noted. IMPRESSION: Fluoroscopic guidance provided during surgical internal fixation of proximal right femoral shaft fracture. Electronically Signed   By: Marijo Conception M.D.   On: 03/19/2022 09:45   DG C-Arm 1-60 Min-No Report  Result Date: 03/19/2022 Fluoroscopy was utilized by the requesting physician.  No radiographic interpretation.   DG C-Arm 1-60 Min-No Report  Result Date: 03/19/2022 Fluoroscopy was utilized by the requesting physician.  No radiographic interpretation.    DG Chest Port 1 View  Result Date: 03/19/2022 CLINICAL DATA:  COPD EXAM: PORTABLE CHEST 1 VIEW COMPARISON:  Chest radiograph 01/20/2022 FINDINGS:  The heart is mildly enlarged, unchanged. There is calcified atherosclerotic plaque of the aortic arch. The upper mediastinal contours are within normal limits There is patchy opacity in the right lower lobe which appears increased since the prior study. There is no other focal airspace disease. There is no pulmonary edema. There is no significant pleural effusion. There is no pneumothorax There is no acute osseous abnormality. IMPRESSION: Patchy opacities in the right lower lobe could reflect developing infection in the correct clinical setting. Electronically Signed   By: Valetta Mole M.D.   On: 03/19/2022 08:32     LOS: 1 day   Antonieta Pert, MD [facility]  03/19/2022, 11:46 AM

## 2022-03-19 NOTE — Anesthesia Procedure Notes (Signed)
Arterial Line Insertion Performed by: Valda Favia, CRNA, CRNA  Patient location: OR. Preanesthetic checklist: patient identified, IV checked, site marked, risks and benefits discussed, surgical consent, monitors and equipment checked, pre-op evaluation, timeout performed and anesthesia consent Lidocaine 1% used for infiltration and patient sedated Right, radial was placed Catheter size: 20 G Hand hygiene performed , maximum sterile barriers used  and Seldinger technique used Allen's test indicative of satisfactory collateral circulation Attempts: 1 Procedure performed without using ultrasound guided technique. Following insertion, dressing applied and Biopatch. Post procedure assessment: normal  Patient tolerated the procedure well with no immediate complications.

## 2022-03-19 NOTE — Op Note (Signed)
Orthopaedic Surgery Operative Note (CSN: [id number] ) Date of Surgery: 03/19/2022  Admit Date: 03/18/2022   Diagnoses: Pre-Op Diagnoses: Right periprosthetic femur fracture Right heel decubitus wound  Post-Op Diagnosis: Same  Procedures: CPT 27244-Open reduction internal fixation of right periprosthetic femur fracture CPT 20680-Removal of hardware right femur  Surgeons : Primary: Shona Needles, MD  Assistant: Patrecia Pace, PA-C  Location: OR 3   Anesthesia:General   Antibiotics: Ancef 2g preop with 1 gm vancomycin powder placed topically   Tourniquet time:None    Estimated Blood 123XX123 mL  Complications:None   Specimens:None   Implants: Implant Name Type Inv. Item Serial No. Manufacturer Lot No. LRB No. Used Action  CABLE CERLAGE W/CRIMP 1.8 - [id number] Cable CABLE CERLAGE W/CRIMP 1.8  [employer](ORTH,TRAU,BIO,SG) [id number] Right 1 Implanted  CABLE READY CERCLAGE W/CRIP - [id number] Trauma Fixation CABLE READY CERCLAGE W/CRIP  [employer](ORTH,TRAU,BIO,SG) [id number] Right 1 Implanted  PLATE PROXIMAL FEMUR 12H RT - [id number] Plate PLATE PROXIMAL FEMUR 12H RT  [employer](ORTH,TRAU,BIO,SG)  Right 1 Implanted  SCREW NCB 4.0MX34M - [id number] Screw SCREW NCB 4.0MX34M  [employer](ORTH,TRAU,BIO,SG)  Right 2 Implanted  SCREW NCB 4.0MX42M - [id number] Screw SCREW NCB 4.0MX42M  [employer](ORTH,TRAU,BIO,SG)  Right 1 Implanted  SCREW NCB 5.0X38 - [id number] Screw SCREW NCB 5.0X38  [employer]ON(ORTH,TRAU,BIO,SG)  Right 1 Implanted  SCREW CORTICAL NCB 5.0X40 - [id number] Screw SCREW CORTICAL NCB 5.0X40  [employer](ORTH,TRAU,BIO,SG)  Right 1 Implanted  SCREW CORT NCB SELFTAP 5.0X42 - [id number] Screw SCREW CORT NCB SELFTAP 5.0X42  [employer](ORTH,TRAU,BIO,SG)  Right 1 Implanted  CAP LOCK NCB - [id number] Cap CAP LOCK NCB  [employer](ORTH,TRAU,BIO,SG)  Right 6 Implanted     Indications for Surgery: 88 year old female who underwent cephalomedullary nailing for right  intertrochanteric fracture on August 30 sustained a ground-level fall and a periprosthetic femur fracture around her femoral stem.  Due to the unstable Petra Kuba of her injury I recommend proceeding with open reduction internal fixation.  Risks and benefits were discussed with the patient and her daughter.  Risks included but not limited to bleeding, infection, malunion, nonunion, hardware failure, hardware irritation, nerve or blood vessel injury, DVT, even the possibility anesthetic complications.  They agreed to proceed with surgery and consent was obtained.  Operative Findings: 1.  Removal of distal interlocking screw from [place] nail 2.  Reduction internal fixation of the right proximal femur fracture using [employer] cables for provisional reduction and proximal 12 hole NCB femoral locking plate.  Procedure: The patient was identified in the preoperative holding area. Consent was confirmed with the patient and their family and all questions were answered. The operative extremity was marked after confirmation with the patient. she was then brought back to the operating room by our anesthesia colleagues.  She was placed under general anesthetic and carefully transferred over to radiolucent flat top table.  Her right lower extremity was prepped and draped in usual sterile fashion.  A timeout was performed to verify the patient, the procedure, and the extremity.  Preoperative antibiotics were dosed.  Fluoroscopic imaging showed the unstable nature of her injury.  The hip and knee were flexed.  Traction was applied to the lower extremity to maintain alignment.  I then made an incision through the lateral thigh.  I carried this down through skin and subcutaneous tissue.  I incised through the IT band and split the vastus lateralis in line with my incision.  I then exposed the fracture site cleaned out the hematoma and proceeded  to reduce the fracture.  I was able to then cable around the  femur x2.  I was able to provisionally hold this reduction while I position a 12 hole [employer] NCB proximal femoral locking plate and slid this submuscularly along the lateral cortex of the femur attached to a targeting arm.  I held the proximal portion in position with a K wire.  I then used a 3.3 mm drill bit to align the distal portion of the plate.  I then proceeded to drill bicortically anterior to the nail proximally and gained excellent purchase to bring the plate flush to bone.  I then percutaneously placed 5.0 millimeter screws into the femoral shaft.  A total of three 5.0 mm screws were placed in the femoral shaft.  The 3.3 mm drill bit was removed and a 4.0 millimeter screw was placed.  Locking caps were placed in all 5.0 millimeter screws.  I returned to the proximal segment and proceeded to place a total of 5 bicortical 4.0 millimeter screws.  Locking caps were placed on all the proximal interlocks.  Final fluoroscopic imaging was obtained.  The incision was copiously irrigated.  A gram of vancomycin powder was placed into the incision.  A layered closure of 0 Vicryl, 2-0 Vicryl and 3-0 Monocryl was used to close the skin.  Sterile dressings were applied.  We then turned the case over to Dr. Sharol Given for debridement of her heel wound.  See his operative note for full details regarding the procedure.  She was then taken to the PACU in stable condition after awoken from anesthesia.  Post Op Plan/Instructions: Patient will be weightbearing as tolerated to the right lower extremity.  She will have unrestricted range of motion of the knee and hip.  She will receive postoperative Ancef.  She will receive Lovenox for DVT prophylaxis and discharged on a DOAC.  We will have her mobilize with physical and Occupational Therapy.  I was present and performed the entire surgery.  Patrecia Pace, PA-C did assist me throughout the case. An assistant was necessary given the difficulty in approach, maintenance  of reduction and ability to instrument the fracture.   Katha Hamming, MD [facility]

## 2022-03-19 NOTE — Hospital Course (Signed)
88 y.o. female with medical history significant for Persistent A FIB/a Flutter previously on amiodarone, hypertension, severe MR/moderate to severe TR, pulmonary hypertension, LBBB B, DM-diet controlled, COPD recent hospitalization 8/28-01/26/2022 for right hip fracture with A-fib RVR/acute systolic CHF with EF 46-27%, postop anemia who had sustained a new fall at home and right femur diaphysis fracture, no syncope or loss of consciousness reported.  In the ED vital signs showed elevated blood pressure 190/80 hemoglobin 8.5 g, rest of your lites were normal, Dr. Doreatha Martin at [facility] was consulted and agreed with ED physician therefore [facility] transfer for ORIF.  Bed had opened up 10/25 but apparently patient was not n.p.o. surgery canceled, patient got a bed 03/17/22 but she was not n.p.o. so surgery was canceled. Patient arrived to short stay where routine labs showed hemoglobin 5.7 g, 2 units PRBC ordered surgery canceled and admission was requested. In [facility] [place] apparently give rocephin x1 10/26 for ?uti and has foley in place.She is hard of hearing. Patient was admitted and orthopedic podiatry consulted.  Received 2 units PRBC 03/19/22: S/p ORIF right periprosthetic femur fracture Dr Doreatha Martin and debridement and VAC application of right foot necrotic ulcer Dr Sharol Given.

## 2022-03-19 NOTE — Anesthesia Procedure Notes (Signed)
Procedure Name: Intubation Date/Time: 03/19/2022 7:59 AM  Performed by: Mariea Clonts, CRNAPre-anesthesia Checklist: Patient identified, Emergency Drugs available, Suction available and Patient being monitored Patient Re-evaluated:Patient Re-evaluated prior to induction Oxygen Delivery Method: Circle System Utilized Preoxygenation: Pre-oxygenation with 100% oxygen Induction Type: IV induction Ventilation: Mask ventilation without difficulty Laryngoscope Size: Mac and 3 Grade View: Grade I Tube type: Oral Tube size: 7.0 mm Number of attempts: 1 Airway Equipment and Method: Stylet and Oral airway Placement Confirmation: ETT inserted through vocal cords under direct vision, positive ETCO2 and breath sounds checked- equal and bilateral Tube secured with: Tape Dental Injury: Teeth and Oropharynx as per pre-operative assessment

## 2022-03-20 DIAGNOSIS — S72351A Displaced comminuted fracture of shaft of right femur, initial encounter for closed fracture: Secondary | ICD-10-CM | POA: Diagnosis not present

## 2022-03-20 LAB — BASIC METABOLIC PANEL
Anion gap: 8 (ref 5–15)
BUN: 22 mg/dL (ref 8–23)
CO2: 23 mmol/L (ref 22–32)
Calcium: 7.6 mg/dL — ABNORMAL LOW (ref 8.9–10.3)
Chloride: 103 mmol/L (ref 98–111)
Creatinine, Ser: 0.88 mg/dL (ref 0.44–1.00)
GFR, Estimated: >60 mL/min (ref >60)
Glucose, Bld: 161 mg/dL — ABNORMAL HIGH (ref 70–99)
Potassium: 4.4 mmol/L (ref 3.5–5.1)
Sodium: 134 mmol/L — ABNORMAL LOW (ref 135–145)

## 2022-03-20 LAB — CBC
HCT: 24 % — ABNORMAL LOW (ref 36.0–46.0)
Hemoglobin: 8 g/dL — ABNORMAL LOW (ref 12.0–15.0)
MCH: 28.3 pg (ref 26.0–34.0)
MCHC: 33.3 g/dL (ref 30.0–36.0)
MCV: 84.8 fL (ref 80.0–100.0)
Platelets: 190 10*3/uL (ref 150–400)
RBC: 2.83 MIL/uL — ABNORMAL LOW (ref 3.87–5.11)
RDW: 15.3 % (ref 11.5–15.5)
WBC: 9.9 10*3/uL (ref 4.0–10.5)
nRBC: 0 % (ref 0.0–0.2)

## 2022-03-20 LAB — GLUCOSE, CAPILLARY
Glucose-Capillary: 143 mg/dL — ABNORMAL HIGH (ref 70–99)
Glucose-Capillary: 186 mg/dL — ABNORMAL HIGH (ref 70–99)
Glucose-Capillary: 136 mg/dL — ABNORMAL HIGH (ref 70–99)
Glucose-Capillary: 162 mg/dL — ABNORMAL HIGH (ref 70–99)

## 2022-03-20 MED ORDER — ENOXAPARIN SODIUM 30 MG/0.3ML IJ SOSY: 30.0000 mg | PREFILLED_SYRINGE | INTRAMUSCULAR | Status: DC

## 2022-03-20 MED ORDER — APIXABAN 2.5 MG PO TABS
2.5000 mg | ORAL_TABLET | Freq: Two times a day (BID) | ORAL | Status: DC
  Administered 2022-03-20 – 2022-03-24 (×9): 2.5 mg via ORAL
  Filled 2022-03-20 (×9): qty 1

## 2022-03-20 NOTE — Progress Notes (Signed)
     Subjective:  Patient is lying comfortably in bed this morning.  Pain is well controlled.  She has no complaints or concerns.  Has not yet been out of bed since surgery yesterday.  Eager to work with therapy today.  Objective:   VITALS:   Vitals:   03/19/22 1356 03/19/22 1726 03/19/22 2016 03/20/22 0019  BP: (!) 142/69 (!) 134/54 (!) 137/47 (!) 146/63  Pulse: 75 77 80 72  Resp: 18 19 19 14   Temp: 97.8 F (36.6 C) 98.4 F (36.9 C) 98.4 F (36.9 C) 98.4 F (36.9 C)  TempSrc: Oral Oral Oral Oral  SpO2: 100% 99% 99% 99%  Weight:      Height:        Sensation intact distally Dorsiflexion/Plantar flexion intact Incision: dressing C/D/I Compartment soft   Lab Results  Component Value Date   WBC 9.9 03/20/2022   HGB 8.0 (L) 03/20/2022   HCT 24.0 (L) 03/20/2022   MCV 84.8 03/20/2022   PLT 190 03/20/2022   BMET    Component Value Date/Time   NA 134 (L) 03/20/2022 0244   K 4.4 03/20/2022 0244   CL 103 03/20/2022 0244   CO2 23 03/20/2022 0244   GLUCOSE 161 (H) 03/20/2022 0244   BUN 22 03/20/2022 0244   CREATININE 0.88 03/20/2022 0244   CREATININE 0.97 (H) 03/17/2016 1400   CALCIUM 7.6 (L) 03/20/2022 0244   GFRNONAA >60 03/20/2022 0244    Assessment/Plan: 1 Day Post-Op   Principal Problem:   Femur fracture, right (HCC) Active Problems:   Benign essential HTN   Iron deficiency anemia   Closed fracture of right hip (HCC)   Pressure injury of skin   Heel ulcer, right, with necrosis of bone (HCC)  Status post ORIF right periprosthetic femur fracture with Dr. Doreatha Martin 03/19/2022 I&D right heel wound by Dr. Sharol Given 03/19/2022  Post op recs: WB: Weightbearing as tolerated right lower extremity Dressing: Okay to change dressing to the right hip starting 1029 as needed Antibiotics: Ancef postop DVT prophylaxis: Lovenox for DVT prophylaxis and discharged on a DOAC  Follow up: Follow-up with Dr. Doreatha Martin post discharge      [name] A [name] 03/20/2022, 7:34  AM   [name], MD  Contact information:   [phone] 7am-5pm epic message Dr. Zachery Dakins, or call office for patient follow up: [phone] After hours and holidays please check [email or web address] for group call information for [facility]

## 2022-03-20 NOTE — Progress Notes (Signed)
Ok to switch lovenox to apixaban 2.5mg  BID for DVT prophylaxis per Dr Zachery Dakins.  Onnie Boer, PharmD, BCIDP, AAHIVP, CPP Infectious Disease Pharmacist 03/20/2022 10:00 AM

## 2022-03-20 NOTE — Progress Notes (Signed)
Patient ID: [name], female   DOB: 1927/04/03, 88 y.o.   MRN: [id number] Patient is postoperative day 1 debridement of full-thickness ulcer right heel that extended down to the calcaneus.  Patient has no complaints this morning there is 25 cc in the wound VAC canister.  Patient has good protective foam boots for both lower extremities.  Plan for discharge with the portable Praveena wound VAC pump for 1 week after discharge.

## 2022-03-20 NOTE — Evaluation (Signed)
Occupational Therapy Evaluation Patient Details Name: [name] MRN: [id number] DOB: [birth year] Today's Date: 03/20/2022   History of Present Illness [name] is an 88 y.o. female 8 wks s/p IMN of right hip intertroch who was doing very well when she sustained a new fall with immediate pain, shortening, and malrotation. X-rays confirmed fracture at the tip of the nail. Patient also has bilateral heel pressure ulcers with foul odor on the right heel. Pt s/p Open reduction internal fixation of right periprosthetic femur fracture and excisional debridement of skin and soft tissue muscle and fascia right heel with application of  wound VAC sponge   Clinical Impression   Pt presents with decline in function and safety with ADLs and ADL mobility with impaired strength, balance and endurance. Pt very pleasant and cooperative. PTA. Pt lived at home alone and children living close by checking on her often as well as assisting her with LB ADLs, bathing, cooking and laundry. Pt with recent fall approximately 2 months ago with IM nailing of R hip for repair. Pt currently requires mod - max A with LB ADLs, min guard A with UB ADLs, mod A with toileting and min A +2 using RW for mobility. Pt would benefit from acute OT services to address impairments to maximize level of function and safety     Recommendations for follow up therapy are one component of a multi-disciplinary discharge planning process, led by the attending physician.  Recommendations may be updated based on patient status, additional functional criteria and insurance authorization.   Follow Up Recommendations  Home health OT [facility] services with 24/7 assist/sup, if unable to have 24/7 then SNF rehab is recommended)    Assistance Recommended at Discharge Frequent or constant Supervision/Assistance  Patient can return home with the following A lot of help with bathing/dressing/bathroom;A little help with walking and/or transfers;Direct  supervision/assist for medications management;Assistance with cooking/housework    Functional Status Assessment  Patient has had a recent decline in their functional status and demonstrates the ability to make significant improvements in function in a reasonable and predictable amount of time.  Equipment Recommendations  None recommended by OT    Recommendations for Other Services       Precautions / Restrictions Precautions Precautions: Fall Precaution Comments: wound VAC R heel, HOH Restrictions Weight Bearing Restrictions: No RLE Weight Bearing: Weight bearing as tolerated      Mobility Bed Mobility Overal bed mobility: Needs Assistance Bed Mobility: Supine to Sit     Supine to sit: Min assist     General bed mobility comments: cues to initiate and to scoot hips forward, min A for anchorage for trunk elevation    Transfers Overall transfer level: Needs assistance Equipment used: Rolling walker (2 wheels) Transfers: Sit to/from Stand, Bed to chair/wheelchair/BSC Sit to Stand: Min assist, +2 safety/equipment                  Balance Overall balance assessment: Needs assistance Sitting-balance support: Bilateral upper extremity supported Sitting balance-Leahy Scale: Fair     Standing balance support: Bilateral upper extremity supported, During functional activity Standing balance-Leahy Scale: Poor                             ADL either performed or assessed with clinical judgement   ADL Overall ADL's : Needs assistance/impaired Eating/Feeding: Set up;Independent;Sitting   Grooming: Min guard;Sitting   Upper Body Bathing: Min guard;Sitting   Lower  Body Bathing: Moderate assistance   Upper Body Dressing : Min guard;Sitting   Lower Body Dressing: Maximal assistance   Toilet Transfer: Minimal assistance;+2 for safety/equipment;Ambulation;Rolling walker (2 wheels);Cueing for safety;Cueing for sequencing Toilet Transfer Details (indicate  cue type and reason): simulated to chair Toileting- Clothing Manipulation and Hygiene: Moderate assistance;Cueing for safety;Sit to/from stand       Functional mobility during ADLs: Minimal assistance;+2 for safety/equipment;Rolling walker (2 wheels)       Vision Baseline Vision/History: 1 Wears glasses Ability to See in Adequate Light: 0 Adequate Patient Visual Report: No change from baseline       Perception     Praxis      Pertinent Vitals/Pain Pain Assessment Pain Assessment: No/denies pain     Hand Dominance Right   Extremity/Trunk Assessment Upper Extremity Assessment Upper Extremity Assessment: Generalized weakness   Lower Extremity Assessment Lower Extremity Assessment: Defer to PT evaluation       Communication Communication Communication: HOH   Cognition Arousal/Alertness: Awake/alert Behavior During Therapy: WFL for tasks assessed/performed Overall Cognitive Status: Within Functional Limits for tasks assessed                                 General Comments: pt very [facility]     General Comments       Exercises     Shoulder Instructions      Home Living Family/patient expects to be discharged to:: Private residence Living Arrangements: Alone Available Help at Discharge: Family;Available 24 hours/day Type of Home: House Home Access: Level entry     Home Layout: One level     Bathroom Shower/Tub: Occupational psychologist: Standard     Home Equipment: Shower seat;Cane - single Barista (2 wheels);BSC/3in1          Prior Functioning/Environment Prior Level of Function : Patient poor historian/Family not available;Needs assist             Mobility Comments: Pt reports using RW at baseline. ADLs Comments: Pt's son helps with groceries, pt's daughters have been helping withm LB dressing and bathing since fall; pt abnle to transfer on/off BSC and Ind with toileting        OT Problem List:         OT Treatment/Interventions: Self-care/ADL training;Therapeutic exercise;Patient/family education;Balance training;Therapeutic activities;DME and/or AE instruction    OT Goals(Current goals can be found in the care plan section) Acute Rehab OT Goals Patient Stated Goal: go back home OT Goal Formulation: With patient Time For Goal Achievement: 04/03/22 Potential to Achieve Goals: Good ADL Goals Pt Will Perform Grooming: with supervision;with set-up;sitting;with caregiver independent in assisting Pt Will Perform Upper Body Bathing: with supervision;with set-up;sitting;with caregiver independent in assisting Pt Will Perform Lower Body Bathing: with min assist;sitting/lateral leans;sit to/from stand;with caregiver independent in assisting Pt Will Perform Upper Body Dressing: with supervision;with set-up;sitting;with caregiver independent in assisting Pt Will Perform Lower Body Dressing: with mod assist;with min assist;sitting/lateral leans;sit to/from stand;with caregiver independent in assisting Pt Will Transfer to Toilet: with min assist;with min guard assist;ambulating;bedside commode;regular height toilet;grab bars Pt Will Perform Toileting - Clothing Manipulation and hygiene: with min assist;with min guard assist;sit to/from stand;with caregiver independent in assisting  OT Frequency: Min 2X/week    Co-evaluation PT/OT/SLP Co-Evaluation/Treatment: Yes Reason for Co-Treatment: Complexity of the patient's impairments (multi-system involvement);For patient/therapist safety;To address functional/ADL transfers          AM-PAC OT "6 Clicks"  Daily Activity     Outcome Measure Help from another person eating meals?: None Help from another person taking care of personal grooming?: A Little Help from another person toileting, which includes using toliet, bedpan, or urinal?: A Lot Help from another person bathing (including washing, rinsing, drying)?: A Lot Help from another person to put on and  taking off regular upper body clothing?: A Little Help from another person to put on and taking off regular lower body clothing?: A Lot 6 Click Score: 16   End of Session Equipment Utilized During Treatment: Gait belt;Rolling walker (2 wheels) Nurse Communication: Mobility status  Activity Tolerance: Patient tolerated treatment well Patient left: in chair;with call bell/phone within reach;with chair alarm set  OT Visit Diagnosis: Unsteadiness on feet (R26.81);Other abnormalities of gait and mobility (R26.89);History of falling (Z91.81);Muscle weakness (generalized) (M62.81)                Time: 8127-5170 OT Time Calculation (min): 24 min Charges:  OT General Charges $OT Visit: 1 Visit OT Evaluation $OT Eval Moderate Complexity: 1 Mod    Galen Manila 03/20/2022, 12:35 PM

## 2022-03-20 NOTE — Evaluation (Signed)
Physical Therapy Evaluation Patient Details Name: [name] MRN: [id number] DOB: 1926-12-05 Today's Date: 03/20/2022  History of Present Illness  88 yo female presents to [facility] on 10/26 s/p fall with xray showing Right peri-implant femoral shaft fracture, s/p IMN R hip intertroch 01/20/2022. Pt also with bilat heel wounds. s/p ORIF R periprosthetic femur fx, removal of hardware as well as xcisional debridement of skin and soft tissue muscle and fascia R heel on 10/27. PMH inlcudes anxiety, afib, HF, COPD, depression, HTN, DMII.  Clinical Impression   Pt presents with generalized weakness, min RLE pain post-op, impaired balance with recent history of multiple falls, impaired gait, and poor activity tolerance. Pt to benefit from acute PT to address deficits. Pt ambulated short room distance, overall requiring min +2 or mod +1 for mobility with one notable posterior LOB requiring PT intervention to correct. PT recommendations below, pt states she can have constant supervision at home, but unsure of accuracy. PT to progress mobility as tolerated, and will continue to follow acutely.         Recommendations for follow up therapy are one component of a multi-disciplinary discharge planning process, led by the attending physician.  Recommendations may be updated based on patient status, additional functional criteria and insurance authorization.  Follow Up Recommendations Skilled nursing-short term rehab (<3 hours/day) (vs home with 24/7 support from family) Can patient physically be transported by private vehicle: Yes    Assistance Recommended at Discharge Frequent or constant Supervision/Assistance  Patient can return home with the following  A little help with walking and/or transfers;A little help with bathing/dressing/bathroom;Direct supervision/assist for medications management;Assistance with cooking/housework;Assist for transportation;Help with stairs or ramp for entrance    Equipment  Recommendations None recommended by PT  Recommendations for Other Services       Functional Status Assessment Patient has had a recent decline in their functional status and demonstrates the ability to make significant improvements in function in a reasonable and predictable amount of time.     Precautions / Restrictions Precautions Precautions: Fall Precaution Comments: wound VAC R heel, HOH Restrictions Weight Bearing Restrictions: No RLE Weight Bearing: Weight bearing as tolerated      Mobility  Bed Mobility Overal bed mobility: Needs Assistance Bed Mobility: Supine to Sit     Supine to sit: Min assist     General bed mobility comments: cues to initiate and to scoot hips forward, assist for RLE progression and trunk elevation    Transfers Overall transfer level: Needs assistance Equipment used: Rolling walker (2 wheels) Transfers: Sit to/from Stand, Bed to chair/wheelchair/BSC Sit to Stand: Min assist, +2 safety/equipment           General transfer comment: assist for rise and steady, cues for weight shifting onto RLE as tolerated    Ambulation/Gait Ambulation/Gait assistance: Min assist, +2 safety/equipment Gait Distance (Feet): 10 Feet Assistive device: Rolling walker (2 wheels) Gait Pattern/deviations: Step-to pattern, Decreased step length - right, Antalgic, Trunk flexed Gait velocity: decr     General Gait Details: assist to steady, one notable posterior LOB when backing up to recliner  Stairs            Wheelchair Mobility    Modified Rankin (Stroke Patients Only)       Balance Overall balance assessment: Needs assistance, History of Falls Sitting-balance support: Bilateral upper extremity supported Sitting balance-Leahy Scale: Fair     Standing balance support: Bilateral upper extremity supported, During functional activity Standing balance-Leahy Scale: Poor  Pertinent Vitals/Pain Pain  Assessment Pain Assessment: Faces Faces Pain Scale: Hurts a little bit Pain Location: R hip Pain Descriptors / Indicators: Grimacing, Guarding Pain Intervention(s): Limited activity within patient's tolerance, Monitored during session, Repositioned    Home Living Family/patient expects to be discharged to:: Private residence Living Arrangements: Alone Available Help at Discharge: Family;Available 24 hours/day Type of Home: House Home Access: Level entry       Home Layout: One level Home Equipment: Shower seat;Cane - single Librarian, academicpoint;Rolling Walker (2 wheels);BSC/3in1      Prior Function Prior Level of Function : Patient poor historian/Family not available;Needs assist             Mobility Comments: Pt reports using RW at baseline. ADLs Comments: Pt's son helps with groceries, pt's daughters have been helping withm LB dressing and bathing since fall; pt abnle to transfer on/off BSC and Ind with toileting     Hand Dominance   Dominant Hand: Right    Extremity/Trunk Assessment   Upper Extremity Assessment Upper Extremity Assessment: Defer to OT evaluation    Lower Extremity Assessment Lower Extremity Assessment: Generalized weakness;RLE deficits/detail RLE Deficits / Details: able to perform quad set, heel slide to approx 50% full AAROM RLE: Unable to fully assess due to pain    Cervical / Trunk Assessment Cervical / Trunk Assessment: Kyphotic  Communication   Communication: HOH  Cognition Arousal/Alertness: Awake/alert Behavior During Therapy: WFL for tasks assessed/performed Overall Cognitive Status: Impaired/Different from baseline Area of Impairment: Attention, Following commands, Safety/judgement                   Current Attention Level: Sustained   Following Commands: Follows one step commands with increased time Safety/Judgement: Decreased awareness of deficits, Decreased awareness of safety     General Comments: pt very HOH, tangential and  difficult to keep pt on task at times        General Comments General comments (skin integrity, edema, etc.): vss    Exercises     Assessment/Plan    PT Assessment Patient needs continued PT services  PT Problem List Decreased strength;Decreased mobility;Decreased safety awareness;Decreased activity tolerance;Decreased balance;Decreased knowledge of use of DME;Pain;Decreased cognition;Decreased skin integrity;Decreased knowledge of precautions       PT Treatment Interventions DME instruction;Therapeutic activities;Gait training;Therapeutic exercise;Patient/family education;Balance training;Neuromuscular re-education;Functional mobility training    PT Goals (Current goals can be found in the Care Plan section)  Acute Rehab PT Goals Patient Stated Goal: home PT Goal Formulation: With patient Time For Goal Achievement: 04/03/22 Potential to Achieve Goals: Good    Frequency Min 3X/week     Co-evaluation   Reason for Co-Treatment: For patient/therapist safety;To address functional/ADL transfers           AM-PAC PT "6 Clicks" Mobility  Outcome Measure Help needed turning from your back to your side while in a flat bed without using bedrails?: A Little Help needed moving from lying on your back to sitting on the side of a flat bed without using bedrails?: A Lot Help needed moving to and from a bed to a chair (including a wheelchair)?: A Lot Help needed standing up from a chair using your arms (e.g., wheelchair or bedside chair)?: A Little Help needed to walk in hospital room?: A Lot Help needed climbing 3-5 steps with a railing? : Total 6 Click Score: 13    End of Session Equipment Utilized During Treatment: Gait belt Activity Tolerance: Patient tolerated treatment well Patient left: with call bell/phone within reach;in  chair;with chair alarm set Nurse Communication: Mobility status PT Visit Diagnosis: Other abnormalities of gait and mobility (R26.89);Muscle weakness  (generalized) (M62.81);Repeated falls (R29.6)    Time: 8003-4917 PT Time Calculation (min) (ACUTE ONLY): 20 min   Charges:   PT Evaluation $PT Eval Low Complexity: 1 Low        Kaysa Roulhac S, PT DPT Acute Rehabilitation Services Pager [phone]  Office [phone]   Louis Matte 03/20/2022, 3:38 PM

## 2022-03-20 NOTE — Progress Notes (Signed)
PROGRESS NOTE [name]  [id number] DOB: [birth year] DOA: 03/18/2022 PCP: Leslie Andrea, MD   Brief Narrative/Hospital Course: 88 y.o. female with medical history significant for Persistent A FIB/a Flutter previously on amiodarone, hypertension, severe MR/moderate to severe TR, pulmonary hypertension, LBBB B, DM-diet controlled, COPD recent hospitalization 8/28-01/26/2022 for right hip fracture with A-fib RVR/acute systolic CHF with EF [id number], postop anemia who had sustained a new fall at home and right femur diaphysis fracture, no syncope or loss of consciousness reported.  In the ED vital signs showed elevated blood pressure 190/80 hemoglobin 8.5 g, rest of your lites were normal, Dr. Doreatha Martin at [facility] was consulted and agreed with ED physician therefore [facility] transfer for ORIF.  Bed had opened up 10/25 but apparently patient was not n.p.o. surgery canceled, patient got a bed 03/17/22 but she was not n.p.o. so surgery was canceled. Patient arrived to short stay where routine labs showed hemoglobin 5.7 g, 2 units PRBC ordered surgery canceled and admission was requested. In [facility] [place] apparently give rocephin x1 10/26 for ?uti and has foley in place.She is hard of hearing. Patient was admitted and orthopedic podiatry consulted.  Received 2 units PRBC 03/19/22: S/p ORIF right periprosthetic femur fracture Dr Doreatha Martin and debridement and VAC application of right foot necrotic ulcer Dr Sharol Given.    Subjective: Seen and examined this morning Was having her breakfast No complaint no pain Overnight afebrile   Assessment and Plan: Principal Problem:   Femur fracture, right (HCC) Active Problems:   Benign essential HTN   Iron deficiency anemia   Closed fracture of right hip (HCC)   Pressure injury of skin   Heel ulcer, right, with necrosis of bone (HCC)   Acute blood loss anemia in the setting of chronic anemia Iron deficiency anemia: Acute drop in hemoglobin likely multifactorial in the  setting of chronic anemia iron deficiency anemia, recent acute blood loss anemia in the setting of hip fracture and surgery on last admission, H&H holding stable at 8.0 g as below, had 2 units transfusion.  Monitor H&H while resuming on Eliquis> will benefit with transfusion if h/h drops below 8 g.   Recent Labs  Lab 03/18/22 1354 03/19/22 0110 03/20/22 0244  HGB 5.7* 8.7* 8.0*  HCT 18.7* 27.4* 24.0*     Right periprosthetic femur fracture s/p fall:S/p ORIF right periprosthetic femur fracture Dr Doreatha Martin.  Continue postop care pain control PT OT DVT prophylaxis-resuming Eliquis 2.5 mg bid> will monitor hemoglobin.  Necrotic right foot decubitus ulcer: s/p debridement and VAC application Dr Sharol Given continue [facility] care, she will go on Prevena wound VAC.   Vitamind D deficienc: replacement started w/ high-dose weekly.  Recent systolic CHF with EF 123456 MR/TR Pulm hypertension: BP is controlled.  Volume status stable. Chest x-ray with patchy opacities in the RLL -encourage incentive spirometry, Glisan PT OT.  Continue home PRN  Lasix, cont home amio, metoprolol at 50 mg w/ holding parameters.   Paroxysmal A-fib, on Eliquis at home- on hold preop> resumed 10/28. Cont metoprololo, amio   COPD/former smoker continue inhaler- not on o2, has diminished BS. Cont prn nebs, inhalers, [place] as needed   Anxiety/depression-mood stable   Diet controlled diabetes: Diet controlled   Moderate malnutrition:Body mass index is 18.31 kg/m. Consutl dietitian Dietitian consulted.  Pressure injury sacrum stage II POA continue wound care foam dressing  DVT prophylaxis: enoxaparin (LOVENOX) injection 30 mg Start: 03/20/22 0900 SCDs Start: 03/19/22 1131 SCDs Start: 03/18/22 1548 Code Status:   Code Status: DNR  Family Communication: plan of care discussed with patient/daughter at bedside 10/27-no family at bedside today  Patient status is: Inpatient because of hip fracture Level of care: Med-Surg  Dispo: The  patient is from: Home            Anticipated disposition: To SNF  Mobility Assessment (last 72 hours)     Mobility Assessment     Row Name 03/19/22 1119 03/18/22 1630         Does patient have an order for bedrest or is patient medically unstable Yes- Bedfast (Level 1) - Complete Yes- Bedfast (Level 1) - Complete      What is the highest level of mobility based on the progressive mobility assessment? Level 1 (Bedfast) - Unable to balance while sitting on edge of bed Level 1 (Bedfast) - Unable to balance while sitting on edge of bed      Is the above level different from baseline mobility prior to current illness? Yes - Recommend PT order Yes - Recommend PT order              Objective: Vitals last 24 hrs: Vitals:   03/19/22 1726 03/19/22 2016 03/20/22 0019 03/20/22 0826  BP: (!) 134/54 (!) 137/47 (!) 146/63 (!) 150/70  Pulse: 77 80 72 71  Resp: 19 19 14 16   Temp: 98.4 F (36.9 C) 98.4 F (36.9 C) 98.4 F (36.9 C)   TempSrc: Oral Oral Oral   SpO2: 99% 99% 99% 100%  Weight:      Height:       Weight change:   Physical Examination: General exam: AA, elderly frail on room air not in distress HEENT:Oral mucosa moist, Ear/Nose WNL grossly, dentition normal. Respiratory system: bilaterally clear BS, no use of accessory muscle Cardiovascular system: S1 & S2 +, regular rate, JVD neg. Gastrointestinal system: Abdomen soft, NT,ND,BS+ Nervous System:Alert, awake, moving extremities and grossly nonfocal Extremities: Right foot with dressing and wound VAC in place, right hip surgical site with intact dressing  Skin: No rashes,no icterus. MSK: thin muscle bulk,tone, power   Medications reviewed:  Scheduled Meds:  acetaminophen  650 mg Oral Q8H   amiodarone  200 mg Oral Daily   atorvastatin  40 mg Oral q1800   cholecalciferol  1,000 Units Oral Daily   docusate sodium  100 mg Oral BID   enoxaparin (LOVENOX) injection  30 mg Subcutaneous Q24H   ferrous sulfate  325 mg Oral Q  breakfast   insulin aspart  0-6 Units Subcutaneous TID WC   levothyroxine  25 mcg Oral QAC breakfast   metoprolol succinate  50 mg Oral Q breakfast   pantoprazole  40 mg Oral Daily   senna-docusate  1 tablet Oral BID   umeclidinium bromide  1 puff Inhalation Daily   Vitamin D (Ergocalciferol)  50,000 Units Oral Q7 days   Continuous Infusions:  sodium chloride 50 mL/hr at 03/19/22 1309    ceFAZolin (ANCEF) IV 2 g (03/20/22 0020)   methocarbamol (ROBAXIN) IV     Diet Order             Diet Heart Room service appropriate? Yes; Fluid consistency: Thin  Diet effective now                  Intake/Output Summary (Last 24 hours) at 03/20/2022 0842 Last data filed at 03/19/2022 1652 Gross per 24 hour  Intake 1280 ml  Output 500 ml  Net 780 ml   Net IO Since Admission: 1,572.5 mL [03/20/22  0842]  Wt Readings from Last 3 Encounters:  03/18/22 49.9 kg  02/23/22 49.9 kg  01/26/22 50.6 kg     Unresulted Labs (From admission, onward)     Start     Ordered   03/20/22 0500  CBC  Daily at 5am,   R     Question:  Specimen collection method  Answer:  Lab=Lab collect   03/19/22 1130   03/20/22 [id number]  Basic metabolic panel  Daily at 5am,   R     Question:  Specimen collection method  Answer:  Lab=Lab collect   03/19/22 1130   03/19/22 [id number]  Basic metabolic panel  Daily at 5am,   R      03/18/22 1548   03/19/22 0500  CBC  Daily at 5am,   R      03/18/22 1548   03/18/22 1548  Urinalysis, Routine w reflex microscopic  Once,   R        03/18/22 1548          Data Reviewed: I have personally reviewed following labs and imaging studies CBC: Recent Labs  Lab 03/18/22 1354 03/19/22 0110 03/20/22 0244  WBC 10.4 8.8 9.9  HGB 5.7* 8.7* 8.0*  HCT 18.7* 27.4* 24.0*  MCV 91.7 87.5 84.8  PLT 216 178 [id number]   Basic Metabolic Panel: Recent Labs  Lab 03/18/22 1354 03/19/22 0110 03/20/22 0244  NA 138 137 134*  K 4.4 4.3 4.4  CL 103 107 103  CO2 25 25 23   GLUCOSE 94 120* 161*   BUN 20 21 22   CREATININE 0.92 0.89 0.88  CALCIUM 8.1* 7.8* 7.6*  CBG: Recent Labs  Lab 03/19/22 0738 03/19/22 0952 03/19/22 1721 03/19/22 2021 03/20/22 0829  GLUCAP 74 105* 241* 280* 143*   Antimicrobials: Anti-infectives (From admission, onward)    Start     Dose/Rate Route Frequency Ordered Stop   03/19/22 1600  ceFAZolin (ANCEF) IVPB 2g/100 mL premix        2 g 200 mL/hr over 30 Minutes Intravenous Every 8 hours 03/19/22 1130 03/20/22 1559   03/19/22 0737  ceFAZolin (ANCEF) 2-4 GM/100ML-% IVPB       Note to Pharmacy: Tamala Fothergill S: cabinet override      03/19/22 0737 03/19/22 1944   03/18/22 1415  piperacillin-tazobactam (ZOSYN) IVPB 3.375 g  Status:  Discontinued        3.375 g 100 mL/hr over 30 Minutes Intravenous  Once 03/18/22 1401 03/18/22 1644      Culture/Microbiology No results found for: "SDES", "SPECREQUEST", "CULT", "REPTSTATUS"  Other culture-see note  Radiology Studies: DG FEMUR PORT, MIN 2 VIEWS RIGHT  Result Date: 03/19/2022 CLINICAL DATA:  Right femur ORIF. EXAM: RIGHT FEMUR PORTABLE 2 VIEW COMPARISON:  CT right femur dated March 17, 2022. FINDINGS: New lateral plate and screw fixation of the oblique periprosthetic femoral diaphyseal fracture, now in near anatomic alignment. A long anterior diaphyseal cortical fragment remains slightly displaced anteriorly. No evidence of hardware failure or loosening. Unchanged healing right intertrochanteric femur fracture status post gamma nail fixation. Expected small volume subcutaneous emphysema in the right thigh. IMPRESSION: 1. Interval ORIF of the periprosthetic right femoral diaphyseal fracture, now in near anatomic alignment. No hardware complication. Electronically Signed   By: Titus Dubin M.D.   On: 03/19/2022 10:36   DG FEMUR, MIN 2 VIEWS RIGHT  Result Date: 03/19/2022 CLINICAL DATA:  Surgical internal fixation of right femoral fracture. EXAM: RIGHT FEMUR 2 VIEWS; DG C-ARM 1-60 MIN-NO REPORT  Radiation exposure index: 6.82 mGy. COMPARISON:  March 15, 2022. FINDINGS: Ten intraoperative fluoroscopic images were obtained of the right femur. These images demonstrate surgical internal fixation of proximal right femoral shaft fracture. Previous surgical internal fixation of proximal right femur is again noted. IMPRESSION: Fluoroscopic guidance provided during surgical internal fixation of proximal right femoral shaft fracture. Electronically Signed   By: Marijo Conception M.D.   On: 03/19/2022 09:45   DG C-Arm 1-60 Min-No Report  Result Date: 03/19/2022 CLINICAL DATA:  Surgical internal fixation of right femoral fracture. EXAM: RIGHT FEMUR 2 VIEWS; DG C-ARM 1-60 MIN-NO REPORT Radiation exposure index: 6.82 mGy. COMPARISON:  March 15, 2022. FINDINGS: Ten intraoperative fluoroscopic images were obtained of the right femur. These images demonstrate surgical internal fixation of proximal right femoral shaft fracture. Previous surgical internal fixation of proximal right femur is again noted. IMPRESSION: Fluoroscopic guidance provided during surgical internal fixation of proximal right femoral shaft fracture. Electronically Signed   By: Marijo Conception M.D.   On: 03/19/2022 09:45   DG C-Arm 1-60 Min-No Report  Result Date: 03/19/2022 CLINICAL DATA:  Surgical internal fixation of right femoral fracture. EXAM: RIGHT FEMUR 2 VIEWS; DG C-ARM 1-60 MIN-NO REPORT Radiation exposure index: 6.82 mGy. COMPARISON:  March 15, 2022. FINDINGS: Ten intraoperative fluoroscopic images were obtained of the right femur. These images demonstrate surgical internal fixation of proximal right femoral shaft fracture. Previous surgical internal fixation of proximal right femur is again noted. IMPRESSION: Fluoroscopic guidance provided during surgical internal fixation of proximal right femoral shaft fracture. Electronically Signed   By: Marijo Conception M.D.   On: 03/19/2022 09:45   DG Chest Port 1 View  Result Date:  03/19/2022 CLINICAL DATA:  COPD EXAM: PORTABLE CHEST 1 VIEW COMPARISON:  Chest radiograph 01/20/2022 FINDINGS: The heart is mildly enlarged, unchanged. There is calcified atherosclerotic plaque of the aortic arch. The upper mediastinal contours are within normal limits There is patchy opacity in the right lower lobe which appears increased since the prior study. There is no other focal airspace disease. There is no pulmonary edema. There is no significant pleural effusion. There is no pneumothorax There is no acute osseous abnormality. IMPRESSION: Patchy opacities in the right lower lobe could reflect developing infection in the correct clinical setting. Electronically Signed   By: Valetta Mole M.D.   On: 03/19/2022 08:32     LOS: 2 days   Antonieta Pert, MD [facility]  03/20/2022, 8:42 AM

## 2022-03-21 DIAGNOSIS — I10 Essential (primary) hypertension: Secondary | ICD-10-CM

## 2022-03-21 DIAGNOSIS — S72351A Displaced comminuted fracture of shaft of right femur, initial encounter for closed fracture: Secondary | ICD-10-CM | POA: Diagnosis not present

## 2022-03-21 DIAGNOSIS — L97414 Non-pressure chronic ulcer of right heel and midfoot with necrosis of bone: Secondary | ICD-10-CM | POA: Diagnosis not present

## 2022-03-21 DIAGNOSIS — D508 Other iron deficiency anemias: Secondary | ICD-10-CM

## 2022-03-21 LAB — CBC
HCT: 23.7 % — ABNORMAL LOW (ref 36.0–46.0)
HCT: 31.1 % — ABNORMAL LOW (ref 36.0–46.0)
Hemoglobin: 7.8 g/dL — ABNORMAL LOW (ref 12.0–15.0)
Hemoglobin: 10.3 g/dL — ABNORMAL LOW (ref 12.0–15.0)
MCH: 28.4 pg (ref 26.0–34.0)
MCH: 28.6 pg (ref 26.0–34.0)
MCHC: 32.9 g/dL (ref 30.0–36.0)
MCHC: 33.1 g/dL (ref 30.0–36.0)
MCV: 86.2 fL (ref 80.0–100.0)
MCV: 86.4 fL (ref 80.0–100.0)
Platelets: 205 10*3/uL (ref 150–400)
Platelets: 244 10*3/uL (ref 150–400)
RBC: 2.75 MIL/uL — ABNORMAL LOW (ref 3.87–5.11)
RBC: 3.6 MIL/uL — ABNORMAL LOW (ref 3.87–5.11)
RDW: 15 % (ref 11.5–15.5)
RDW: 15.6 % — ABNORMAL HIGH (ref 11.5–15.5)
WBC: 10.5 10*3/uL (ref 4.0–10.5)
WBC: 10.1 10*3/uL (ref 4.0–10.5)
nRBC: 0 % (ref 0.0–0.2)
nRBC: 0 % (ref 0.0–0.2)

## 2022-03-21 LAB — BASIC METABOLIC PANEL
Anion gap: 6 (ref 5–15)
BUN: 22 mg/dL (ref 8–23)
CO2: 24 mmol/L (ref 22–32)
Calcium: 7.6 mg/dL — ABNORMAL LOW (ref 8.9–10.3)
Chloride: 107 mmol/L (ref 98–111)
Creatinine, Ser: 0.87 mg/dL (ref 0.44–1.00)
GFR, Estimated: >60 mL/min (ref >60)
Glucose, Bld: 96 mg/dL (ref 70–99)
Potassium: 4.3 mmol/L (ref 3.5–5.1)
Sodium: 137 mmol/L (ref 135–145)

## 2022-03-21 LAB — GLUCOSE, CAPILLARY
Glucose-Capillary: 83 mg/dL (ref 70–99)
Glucose-Capillary: 99 mg/dL (ref 70–99)
Glucose-Capillary: 108 mg/dL — ABNORMAL HIGH (ref 70–99)
Glucose-Capillary: 111 mg/dL — ABNORMAL HIGH (ref 70–99)

## 2022-03-21 LAB — PREPARE RBC (CROSSMATCH)

## 2022-03-21 MED ORDER — SODIUM CHLORIDE 0.9% IV SOLUTION: Freq: Once | INTRAVENOUS | Status: AC

## 2022-03-21 NOTE — Progress Notes (Signed)
     Subjective:  Patient is lying comfortably in bed this morning.  Pain is well controlled.  She has no complaints or concerns.  She worked with therapy yesterday. Objective:   VITALS:   Vitals:   03/20/22 2205 03/21/22 0622 03/21/22 0737 03/21/22 0955  BP: (!) 149/54 (!) 143/50  (!) 155/58  Pulse:    68  Resp: 18 15  16   Temp: 97.8 F (36.6 C) 98.3 F (36.8 C)  97.9 F (36.6 C)  TempSrc: Oral   Oral  SpO2: 100% 99% 100% 100%  Weight:      Height:        Sensation intact distally Dorsiflexion/Plantar flexion intact Incision: dressing C/D/I Compartment soft   Lab Results  Component Value Date   WBC 10.5 03/21/2022   HGB 7.8 (L) 03/21/2022   HCT 23.7 (L) 03/21/2022   MCV 86.2 03/21/2022   PLT 205 03/21/2022   BMET    Component Value Date/Time   NA 137 03/21/2022 0339   K 4.3 03/21/2022 0339   CL 107 03/21/2022 0339   CO2 24 03/21/2022 0339   GLUCOSE 96 03/21/2022 0339   BUN 22 03/21/2022 0339   CREATININE 0.87 03/21/2022 0339   CREATININE 0.97 (H) 03/17/2016 1400   CALCIUM 7.6 (L) 03/21/2022 0339   GFRNONAA >60 03/21/2022 2706    Assessment/Plan: 2 Days Post-Op   Principal Problem:   Femur fracture, right (HCC) Active Problems:   Benign essential HTN   Iron deficiency anemia   Closed fracture of right hip (HCC)   Pressure injury of skin   Heel ulcer, right, with necrosis of bone (HCC)  Status post ORIF right periprosthetic femur fracture with Dr. Doreatha Martin 03/19/2022 I&D right heel wound by Dr. Sharol Given 03/19/2022  Post op recs: WB: Weightbearing as tolerated right lower extremity Dressing: Okay to change dressing to the right hip starting 1029 as needed Antibiotics: Ancef postop DVT prophylaxis: Given the allergy started on Eliquis for DVT prophylaxis yesterday given hemoglobin stable at 8.0 this morning 7.8. Follow up: Follow-up with Dr. Doreatha Martin post discharge      Haskell Rihn A Ord 03/21/2022, 10:17 AM   [name], MD  Contact  information:   [phone] 7am-5pm epic message Dr. Zachery Dakins, or call office for patient follow up: [phone] After hours and holidays please check [email or web address] for group call information for [facility]

## 2022-03-21 NOTE — TOC Initial Note (Addendum)
Transition of Care [facility]) - Initial/Assessment Note    Patient Details  Name: [name] MRN: [id number] Date of Birth: Dec 24, 1926  Transition of Care [facility]) CM/SW Contact:    Milas Gain, [place] Phone Number: 03/21/2022, 11:20 AM  Clinical Narrative:                  CSW received consult for possible SNF placement at time of discharge. Due to patients current orientation CSW spoke with patients daughter [name] regarding PT recommendation of SNF placement at time of discharge. Patients daughter [name] expressed understanding of PT recommendation and is agreeable to SNF placement at time of discharge. Patients daughter gave CSW permission to fax out initial referral near the [place] area. Number one choice for SNF placement is [facility], second choice is [facility].CSW discussed insurance authorization process and will provide Medicare SNF ratings list with accepted SNF bed offers when available. Patients daughter reports patient is Covid Vaccinated.  No further questions reported at this time. CSW to continue to follow and assist with discharge planning needs.   Expected Discharge Plan: Skilled Nursing Facility Barriers to Discharge: Continued Medical Work up   Patient Goals and CMS Choice Patient states their goals for this hospitalization and ongoing recovery are:: SNF CMS Medicare.gov Compare Post Acute Care list provided to:: Patient Represenative (must comment) (patients daughter [name]) Choice offered to / list presented to : Adult Children (Patients daughter [name])  Expected Discharge Plan and Services Expected Discharge Plan: [place]       Living arrangements for the past 2 months: Single Family Home                                      Prior Living Arrangements/Services Living arrangements for the past 2 months: Single Family Home Lives with:: Self Patient language and need for interpreter reviewed:: Yes Do you feel safe going back  to the place where you live?: No   SNF  Need for Family Participation in Patient Care: Yes (Comment) Care giver support system in place?: Yes (comment)   Criminal Activity/Legal Involvement Pertinent to Current Situation/Hospitalization: No - Comment as needed  Activities of Daily Living Home Assistive Devices/Equipment: Dentures (specify type), Eyeglasses, Walker (specify type), Grab bars around toilet, Grab bars in shower ADL Screening (condition at time of admission) Patient's cognitive ability adequate to safely complete daily activities?: No Is the patient deaf or have difficulty hearing?: Yes Does the patient have difficulty seeing, even when wearing glasses/contacts?: No Does the patient have difficulty concentrating, remembering, or making decisions?: No Patient able to express need for assistance with ADLs?: No Does the patient have difficulty dressing or bathing?: No Independently performs ADLs?: Yes (appropriate for developmental age) Does the patient have difficulty walking or climbing stairs?: Yes Weakness of Legs: Both Weakness of Arms/Hands: None  Permission Sought/Granted Permission sought to share information with : Case Manager, Family Supports, Chartered certified accountant granted to share information with : No  Share Information with NAME: Due to patients current orientation CSW spoke with patients daughter [name]  Permission granted to share info w AGENCY: Due to patients current orientation CSW spoke with patients daughter Ann/SNF  Permission granted to share info w Relationship: Due to patients current orientation CSW spoke with patients daughter [name]  Permission granted to share info w Contact Information: Due to patients current orientation CSW spoke with patients daughter  Dewayne Hatch [phone]  Emotional Assessment Appearance:: Appears stated age     Orientation: : Oriented to Self Alcohol / Substance Use: Not Applicable Psych Involvement: No  (comment)  Admission diagnosis:  Femur fracture, right (HCC) [S72.91XA] Patient Active Problem List   Diagnosis Date Noted   Heel ulcer, right, with necrosis of bone (HCC) 03/19/2022   Femur fracture, right (HCC) 03/18/2022   Pressure injury of skin 03/18/2022   Acute combined systolic and diastolic heart failure (HCC)    Fall    Paroxysmal SVT (supraventricular tachycardia)    Preop cardiovascular exam    A-fib (HCC) 01/18/2022   Closed fracture of right hip (HCC) 01/18/2022   Upper airway cough syndrome 11/19/2019   Dysphagia, oropharyngeal 04/18/2019   Elevated troponin    Hypertensive emergency    Longstanding persistent atrial fibrillation (HCC)    Severe protein-calorie malnutrition (HCC)    Dysphagia 01/31/2019   Throat tightness 12/14/2018   Throat dryness 12/14/2018   Dry mouth, unspecified 12/14/2018   Iron deficiency anemia 05/12/2017   GERD (gastroesophageal reflux disease) 03/17/2016   Constipation 10/29/2015   Gastric erosions 02/03/2015   Sinus bradycardia 09/26/2014   Dyspnea on exertion 09/18/2014   Chronic diastolic HF (heart failure) (HCC) 09/18/2014   Dizziness 09/18/2014   Atrial flutter with rapid ventricular response (HCC)    Atrial fibrillation with RVR (HCC) 09/09/2014   Atrial flutter (HCC) 07/13/2014   Hyponatremia 07/13/2014   Diabetes (HCC) 07/13/2014   Hypothyroidism 07/13/2014   Tachycardia 07/13/2014   Dysphagia, pharyngoesophageal phase 04/16/2014   Neurogenic pain of lower extremity 03/15/2012   Left bundle branch block 03/02/2011   RENAL INSUFFICIENCY 05/22/2010   Benign essential HTN 10/09/2009   Mitral regurgitation 10/09/2009   HYPERLIPIDEMIA-MIXED 02/12/2009   Palpitations 02/12/2009   PCP:  John Giovanni, MD Pharmacy:   [facility] - [place], [place] - [address] [place] [address] Phone: [phone] Fax: [phone]     Social Determinants of Health (SDOH) Interventions     Readmission Risk Interventions     No data to display

## 2022-03-21 NOTE — NC FL2 (Signed)
[place] MEDICAID FL2 LEVEL OF CARE SCREENING TOOL     IDENTIFICATION  Patient Name: [name] Birthdate: 1927/03/24 Sex: female Admission Date (Current Location): 03/18/2022  [facility] and [place] Number:  Producer, television/film/video and Address:  [facility] [place], [address] [place], [place] [address]      Provider Number: [id number]  Attending Physician Name and Address:  Briant Cedar, MD  Relative Name and Phone Number:       Current Level of Care: Hospital Recommended Level of Care: Skilled Nursing Facility Prior Approval Number:    Date Approved/Denied:   PASRR Number: [id number]  Discharge Plan: SNF    Current Diagnoses: Patient Active Problem List   Diagnosis Date Noted   Heel ulcer, right, with necrosis of bone (HCC) 03/19/2022   Femur fracture, right (HCC) 03/18/2022   Pressure injury of skin 03/18/2022   Acute combined systolic and diastolic heart failure (HCC)    Fall    Paroxysmal SVT (supraventricular tachycardia)    Preop cardiovascular exam    A-fib (HCC) 01/18/2022   Closed fracture of right hip (HCC) 01/18/2022   Upper airway cough syndrome 11/19/2019   Dysphagia, oropharyngeal 04/18/2019   Elevated troponin    Hypertensive emergency    Longstanding persistent atrial fibrillation (HCC)    Severe protein-calorie malnutrition (HCC)    Dysphagia 01/31/2019   Throat tightness 12/14/2018   Throat dryness 12/14/2018   Dry mouth, unspecified 12/14/2018   Iron deficiency anemia 05/12/2017   GERD (gastroesophageal reflux disease) 03/17/2016   Constipation 10/29/2015   Gastric erosions 02/03/2015   Sinus bradycardia 09/26/2014   Dyspnea on exertion 09/18/2014   Chronic diastolic HF (heart failure) (HCC) 09/18/2014   Dizziness 09/18/2014   Atrial flutter with rapid ventricular response (HCC)    Atrial fibrillation with RVR (HCC) 09/09/2014   Atrial flutter (HCC) 07/13/2014   Hyponatremia 07/13/2014   Diabetes (HCC)  07/13/2014   Hypothyroidism 07/13/2014   Tachycardia 07/13/2014   Dysphagia, pharyngoesophageal phase 04/16/2014   Neurogenic pain of lower extremity 03/15/2012   Left bundle branch block 03/02/2011   RENAL INSUFFICIENCY 05/22/2010   Benign essential HTN 10/09/2009   Mitral regurgitation 10/09/2009   HYPERLIPIDEMIA-MIXED 02/12/2009   Palpitations 02/12/2009    Orientation RESPIRATION BLADDER Height & Weight     Self  Normal   Weight: 110 lb 0.2 oz (49.9 kg) Height:  5\' 5"  (165.1 cm)  BEHAVIORAL SYMPTOMS/MOOD NEUROLOGICAL BOWEL NUTRITION STATUS      Continent (WDL) Diet (Please see discharge summary)  AMBULATORY STATUS COMMUNICATION OF NEEDS Skin   Limited Assist Verbally Other (Comment) (Wound Incision LDAs,PI sacrum medial stage 2,Incision closed leg R,Incision closed foot R,Negative pressure wound therapy heel,R)                       Personal Care Assistance Level of Assistance  Bathing, Feeding, Dressing Bathing Assistance: Limited assistance Feeding assistance: Independent Dressing Assistance: Limited assistance     Functional Limitations Info  Sight, Hearing, Speech Sight Info: Adequate (WDL) Hearing Info: Adequate Speech Info: Adequate    SPECIAL CARE FACTORS FREQUENCY  PT (By licensed PT), OT (By licensed OT)     PT Frequency: 5x min weekly OT Frequency: 5x min weekly            Contractures Contractures Info: Not present    Additional Factors Info  Code Status, Allergies, Insulin Sliding Scale Code Status Info: DNR Allergies Info: Chocolate,Pork-derived Products  Insulin Sliding Scale Info: insulin aspart (novoLOG) injection 0-6 Units 3 times daily with meals       Current Medications (03/21/2022):  This is the current hospital active medication list Current Facility-Administered Medications  Medication Dose Route Frequency Provider Last Rate Last Admin   0.9 %  sodium chloride infusion (Manually program via Guardrails IV Fluids)    Intravenous Once Alma Friendly, MD       0.9 %  sodium chloride infusion   Intravenous Continuous Corinne Ports, PA-C 50 mL/hr at 03/19/22 1309 New Bag at 03/19/22 1309   acetaminophen (TYLENOL) tablet 650 mg  650 mg Oral Q8H Corinne Ports, PA-C   650 mg at 03/21/22 1610   ALPRAZolam (XANAX) tablet 0.25 mg  0.25 mg Oral QHS PRN Corinne Ports, PA-C   0.25 mg at 03/18/22 2128   amiodarone (PACERONE) tablet 200 mg  200 mg Oral Daily Corinne Ports, PA-C   200 mg at 03/21/22 1014   apixaban (ELIQUIS) tablet 2.5 mg  2.5 mg Oral BID Pham, Minh Q, RPH-CPP   2.5 mg at 03/21/22 1015   atorvastatin (LIPITOR) tablet 40 mg  40 mg Oral [id number] Corinne Ports, PA-C   40 mg at 03/20/22 1926   cholecalciferol (VITAMIN D3) 25 MCG (1000 UNIT) tablet 1,000 Units  1,000 Units Oral Daily Corinne Ports, PA-C   1,000 Units at 03/21/22 1015   docusate sodium (COLACE) capsule 100 mg  100 mg Oral BID Corinne Ports, PA-C   100 mg at 03/21/22 1015   ferrous sulfate tablet 325 mg  325 mg Oral Q breakfast Corinne Ports, PA-C   325 mg at 03/21/22 1014   furosemide (LASIX) tablet 20 mg  20 mg Oral Daily PRN Corinne Ports, PA-C       HYDROcodone-acetaminophen (NORCO/VICODIN) 5-325 MG per tablet 1 tablet  1 tablet Oral Q4H PRN Corinne Ports, PA-C   1 tablet at 03/20/22 1015   insulin aspart (novoLOG) injection 0-6 Units  0-6 Units Subcutaneous TID WC Kc, Maren Beach, MD   1 Units at 03/20/22 1409   levalbuterol (XOPENEX) nebulizer solution 0.63 mg  0.63 mg Nebulization Q6H PRN Corinne Ports, PA-C       levothyroxine (SYNTHROID) tablet 25 mcg  25 mcg Oral QAC breakfast Corinne Ports, PA-C   25 mcg at 03/21/22 4098   methocarbamol (ROBAXIN) tablet 500 mg  500 mg Oral Q6H PRN Corinne Ports, PA-C       Or   methocarbamol (ROBAXIN) 500 mg in dextrose 5 % 50 mL IVPB  500 mg Intravenous Q6H PRN McClung, Sarah A, PA-C       metoCLOPramide (REGLAN) tablet 5-10 mg  5-10 mg Oral Q8H PRN Thereasa Solo, Sarah A,  PA-C       Or   metoCLOPramide (REGLAN) injection 5-10 mg  5-10 mg Intravenous Q8H PRN Corinne Ports, PA-C       metoprolol succinate (TOPROL-XL) 24 hr tablet 50 mg  50 mg Oral Q breakfast Rushie Nyhan A, PA-C   50 mg at 03/21/22 1014   morphine (PF) 2 MG/ML injection 0.5-1 mg  0.5-1 mg Intravenous Q2H PRN Corinne Ports, PA-C   0.5 mg at 03/19/22 2050   ondansetron (ZOFRAN) tablet 4 mg  4 mg Oral Q6H PRN Corinne Ports, PA-C       Or   ondansetron (ZOFRAN) injection 4 mg  4 mg Intravenous Q6H PRN Corinne Ports, PA-C  pantoprazole (PROTONIX) EC tablet 40 mg  40 mg Oral Daily West Bali, PA-C   40 mg at 03/20/22 0857   polyethylene glycol (MIRALAX / GLYCOLAX) packet 17 g  17 g Oral Daily PRN [place] Bali, PA-C       senna-docusate (Senokot-S) tablet 1 tablet  1 tablet Oral BID [place] Bali, PA-C   1 tablet at 03/21/22 1015   umeclidinium bromide (INCRUSE ELLIPTA) 62.5 MCG/ACT 1 puff  1 puff Inhalation Daily [place] Bali, PA-C   1 puff at 03/21/22 6237   Vitamin D (Ergocalciferol) (DRISDOL) 1.25 MG (50000 UNIT) capsule 50,000 Units  50,000 Units Oral Q7 days Lanae Boast, MD   50,000 Units at 03/19/22 1311     Discharge Medications: Please see discharge summary for a list of discharge medications.  Relevant Imaging Results:  Relevant Lab Results:   Additional Information [id number]  Delilah Shan, LCSWA

## 2022-03-21 NOTE — Progress Notes (Signed)
PROGRESS NOTE [name]  [id number] DOB: 1927-01-17 DOA: 03/18/2022 PCP: Leslie Andrea, MD   Brief Narrative/Hospital Course: 88 y.o. female with medical history significant for persistent A FIB/a Flutter previously on amiodarone, hypertension, severe MR/moderate to severe TR, pulmonary hypertension, LBBB B, DM-diet controlled, COPD recent hospitalization 8/28-01/26/2022 for right hip fracture with A-fib RVR/acute systolic CHF with EF [id number], postop anemia who had sustained a new fall at home and right femur prosthesis fracture, no syncope or loss of consciousness reported.  In the ED vital signs showed elevated blood pressure 190/80 hemoglobin 8.5. Dr. D[facility]atha Martin at [facility] was consulted.  Prior to surgery, hemoglobin further trended down to 5.7 g, and 2 units PRBC transfused. On 03/19/22: S/p ORIF right periprosthetic femur fracture by Dr Doreatha Martin and debridement and VAC application of right foot necrotic ulcer by Dr Sharol Given.   Subjective: Patient seen and examined at bedside.  Denies any postop pain, was attempting to call daughter on phone.  Denies any new complaints.  Assessment and Plan: Principal Problem:   Femur fracture, right (HCC) Active Problems:   Benign essential HTN   Iron deficiency anemia   Closed fracture of right hip (HCC)   Pressure injury of skin   Heel ulcer, right, with necrosis of bone (HCC)   Acute blood loss anemia in the setting of chronic anemia Iron deficiency anemia Acute drop in hemoglobin likely multifactorial in the setting of chronic anemia, iron deficiency anemia, recent acute blood loss anemia in the setting of hip fracture and surgery on last admission S/p 2 units transfusion, will transfuse another unit on 03/21/2022 as hemoglobin drop to less than 8 Monitor H&H while resuming on Eliquis> will benefit with transfusion if h/h drops below 8    Right periprosthetic femur fracture s/p fall S/p ORIF right periprosthetic femur fracture by Dr Doreatha Martin Continue  postop care pain control PT OT DVT prophylaxis-resuming Eliquis 2.5 mg bid  Necrotic right foot decubitus ulcer s/p debridement and VAC application by Dr Sharol Given Continue [facility] care, she will go on Prevena wound VAC.   Vitamind D deficiency: replacement started w/ high-dose weekly.  Recent systolic CHF with EF 123456 MR/TR Pulm hypertension: BP is controlled.  Volume status stable. Chest x-ray with patchy opacities in the RLL -encourage incentive spirometry,PT OT Continue home PRN  Lasix, cont home amio, metoprolol at 50 mg w/ holding parameters.   Paroxysmal A-fib, on Eliquis at home, resumed 10/28. Cont metoprololo, amio   COPD/former smoker continue inhaler- not on o2, has diminished BS. Cont prn nebs, inhalers, [place] as needed   Anxiety/depression-mood stable   Diet controlled diabetes: Diet controlled   Moderate malnutrition:Body mass index is 18.31 kg/m Dietitian consulted.  Pressure injury sacrum stage II POA continue wound care foam dressing  DVT prophylaxis: apixaban (ELIQUIS) tablet 2.5 mg Start: 03/20/22 1045 SCDs Start: 03/19/22 1131 SCDs Start: 03/18/22 1548 Code Status:   Code Status: DNR Family Communication: None at bedside  Patient status is: Inpatient because of hip fracture Level of care: Med-Surg  Dispo: The patient is from: Home            Anticipated disposition: To SNF  Mobility Assessment (last 72 hours)     Mobility Assessment     Row Name 03/20/22 1535 03/20/22 1231 03/19/22 1119 03/18/22 1630     Does patient have an order for bedrest or is patient medically unstable -- -- Yes- Bedfast (Level 1) - Complete Yes- Bedfast (Level 1) - Complete    What is the  highest level of mobility based on the progressive mobility assessment? Level 3 (Stands with assist) - Balance while standing  and cannot march in place Level 5 (Walks with assist in room/hall) - Balance while stepping forward/back and can walk in room with assist - Complete Level 1 (Bedfast) - Unable  to balance while sitting on edge of bed Level 1 (Bedfast) - Unable to balance while sitting on edge of bed    Is the above level different from baseline mobility prior to current illness? -- -- Yes - Recommend PT order Yes - Recommend PT order            Objective: Vitals last 24 hrs: Vitals:   03/21/22 0737 03/21/22 0955 03/21/22 1358 03/21/22 1413  BP:  (!) 155/58 (!) 136/47 (!) 130/45  Pulse:  68 61 63  Resp:  16 18 18   Temp:  97.9 F (36.6 C) 98.4 F (36.9 C) 98.2 F (36.8 C)  TempSrc:  Oral Oral Oral  SpO2: 100% 100% 100% 100%  Weight:      Height:       Weight change:   Physical Examination: General: NAD  Cardiovascular: S1, S2 present Respiratory: CTAB Abdomen: Soft, nontender, nondistended, bowel sounds present Musculoskeletal: No bilateral pedal edema noted, right foot with wound VAC in place Skin: Normal Psychiatry: Normal mood   Medications reviewed:  Scheduled Meds:  sodium chloride   Intravenous Once   acetaminophen  650 mg Oral Q8H   amiodarone  200 mg Oral Daily   apixaban  2.5 mg Oral BID   atorvastatin  40 mg Oral q1800   cholecalciferol  1,000 Units Oral Daily   docusate sodium  100 mg Oral BID   ferrous sulfate  325 mg Oral Q breakfast   insulin aspart  0-6 Units Subcutaneous TID WC   levothyroxine  25 mcg Oral QAC breakfast   metoprolol succinate  50 mg Oral Q breakfast   pantoprazole  40 mg Oral Daily   senna-docusate  1 tablet Oral BID   umeclidinium bromide  1 puff Inhalation Daily   Vitamin D (Ergocalciferol)  50,000 Units Oral Q7 days   Continuous Infusions:  sodium chloride 50 mL/hr at 03/19/22 1309   methocarbamol (ROBAXIN) IV     Diet Order             Diet Heart Room service appropriate? Yes; Fluid consistency: Thin  Diet effective now                  Intake/Output Summary (Last 24 hours) at 03/21/2022 1615 Last data filed at 03/21/2022 0630 Gross per 24 hour  Intake 590 ml  Output 750 ml  Net -160 ml   Net IO  Since Admission: 3,730.6 mL [03/21/22 1615]  Wt Readings from Last 3 Encounters:  03/18/22 49.9 kg  02/23/22 49.9 kg  01/26/22 50.6 kg     Unresulted Labs (From admission, onward)     Start     Ordered   03/20/22 0500  CBC  Daily at 5am,   R     Question:  Specimen collection method  Answer:  Lab=Lab collect   03/19/22 1130   03/19/22 0347  Basic metabolic panel  Daily at 5am,   R      03/18/22 1548   03/19/22 0500  CBC  Daily at 5am,   R      03/18/22 1548   03/18/22 1548  Urinalysis, Routine w reflex microscopic  Once,   R  03/18/22 1548          Data Reviewed: I have personally reviewed following labs and imaging studies CBC: Recent Labs  Lab 03/18/22 1354 03/19/22 0110 03/20/22 0244 03/21/22 0339  WBC 10.4 8.8 9.9 10.5  HGB 5.7* 8.7* 8.0* 7.8*  HCT 18.7* 27.4* 24.0* 23.7*  MCV 91.7 87.5 84.8 86.2  PLT 216 178 190 [id number]   Basic Metabolic Panel: Recent Labs  Lab 03/18/22 1354 03/19/22 0110 03/20/22 0244 03/21/22 0339  NA 138 137 134* 137  K 4.4 4.3 4.4 4.3  CL 103 107 103 107  CO2 25 25 23 24   GLUCOSE 94 120* 161* 96  BUN 20 21 22 22   CREATININE 0.92 0.89 0.88 0.87  CALCIUM 8.1* 7.8* 7.6* 7.6*  CBG: Recent Labs  Lab 03/20/22 1210 03/20/22 1712 03/20/22 2056 03/21/22 0738 03/21/22 1141  GLUCAP 186* 136* 162* 83 99   Antimicrobials: Anti-infectives (From admission, onward)    Start     Dose/Rate Route Frequency Ordered Stop   03/19/22 1600  ceFAZolin (ANCEF) IVPB 2g/100 mL premix        2 g 200 mL/hr over 30 Minutes Intravenous Every 8 hours 03/19/22 1130 03/20/22 1036   03/19/22 0737  ceFAZolin (ANCEF) 2-4 GM/100ML-% IVPB       Note to Pharmacy: Tamala Fothergill S: cabinet override      03/19/22 0737 03/19/22 1944   03/18/22 1415  piperacillin-tazobactam (ZOSYN) IVPB 3.375 g  Status:  Discontinued        3.375 g 100 mL/hr over 30 Minutes Intravenous  Once 03/18/22 1401 03/18/22 1644      Culture/Microbiology No results found for:  "SDES", "SPECREQUEST", "CULT", "REPTSTATUS"  Other culture-see note  Radiology Studies: No results found.   LOS: 3 days   Alma Friendly, MD [facility]  03/21/2022, 4:15 PM

## 2022-03-22 ENCOUNTER — Encounter (HOSPITAL_COMMUNITY): Payer: Self-pay | Admitting: Student

## 2022-03-22 DIAGNOSIS — D508 Other iron deficiency anemias: Secondary | ICD-10-CM | POA: Diagnosis not present

## 2022-03-22 DIAGNOSIS — L97414 Non-pressure chronic ulcer of right heel and midfoot with necrosis of bone: Secondary | ICD-10-CM | POA: Diagnosis not present

## 2022-03-22 DIAGNOSIS — I10 Essential (primary) hypertension: Secondary | ICD-10-CM | POA: Diagnosis not present

## 2022-03-22 DIAGNOSIS — S72351A Displaced comminuted fracture of shaft of right femur, initial encounter for closed fracture: Secondary | ICD-10-CM | POA: Diagnosis not present

## 2022-03-22 LAB — BASIC METABOLIC PANEL
Anion gap: 5 (ref 5–15)
BUN: 18 mg/dL (ref 8–23)
CO2: 24 mmol/L (ref 22–32)
Calcium: 7.7 mg/dL — ABNORMAL LOW (ref 8.9–10.3)
Chloride: 105 mmol/L (ref 98–111)
Creatinine, Ser: 0.79 mg/dL (ref 0.44–1.00)
GFR, Estimated: >60 mL/min (ref >60)
Glucose, Bld: 98 mg/dL (ref 70–99)
Potassium: 4.3 mmol/L (ref 3.5–5.1)
Sodium: 134 mmol/L — ABNORMAL LOW (ref 135–145)

## 2022-03-22 LAB — GLUCOSE, CAPILLARY
Glucose-Capillary: 85 mg/dL (ref 70–99)
Glucose-Capillary: 110 mg/dL — ABNORMAL HIGH (ref 70–99)
Glucose-Capillary: 123 mg/dL — ABNORMAL HIGH (ref 70–99)
Glucose-Capillary: 154 mg/dL — ABNORMAL HIGH (ref 70–99)

## 2022-03-22 LAB — BPAM RBC
Blood Product Expiration Date: 202311092359
Blood Product Expiration Date: 202311102359
Blood Product Expiration Date: 202311102359
ISSUE DATE / TIME: 202310261504
ISSUE DATE / TIME: 202310261736
ISSUE DATE / TIME: 202310291350
Unit Type and Rh: 7300
Unit Type and Rh: 7300
Unit Type and Rh: 7300

## 2022-03-22 LAB — CBC
HCT: 30.9 % — ABNORMAL LOW (ref 36.0–46.0)
Hemoglobin: 10 g/dL — ABNORMAL LOW (ref 12.0–15.0)
MCH: 27.8 pg (ref 26.0–34.0)
MCHC: 32.4 g/dL (ref 30.0–36.0)
MCV: 85.8 fL (ref 80.0–100.0)
Platelets: 258 10*3/uL (ref 150–400)
RBC: 3.6 MIL/uL — ABNORMAL LOW (ref 3.87–5.11)
RDW: 15.2 % (ref 11.5–15.5)
WBC: 8.7 10*3/uL (ref 4.0–10.5)
nRBC: 0 % (ref 0.0–0.2)

## 2022-03-22 LAB — TYPE AND SCREEN
ABO/RH(D): B POS
Antibody Screen: NEGATIVE
Unit division: 0
Unit division: 0
Unit division: 0

## 2022-03-22 MED ORDER — POLYETHYLENE GLYCOL 3350 17 G PO PACK: 17.0000 g | PACK | Freq: Every day | ORAL | 0 refills | Status: AC | PRN

## 2022-03-22 MED ORDER — HYDROCODONE-ACETAMINOPHEN 5-325 MG PO TABS: 1.0000 | ORAL_TABLET | Freq: Four times a day (QID) | ORAL | 0 refills | Status: AC | PRN

## 2022-03-22 MED ORDER — SENNOSIDES-DOCUSATE SODIUM 8.6-50 MG PO TABS: 1.0000 | ORAL_TABLET | Freq: Two times a day (BID) | ORAL | 0 refills | Status: AC

## 2022-03-22 NOTE — Care Management Important Message (Signed)
Important Message  Patient Details  Name: [name] MRN: [id number] Date of Birth: [birth year]   Medicare Important Message Given:  Yes     Hannah Beat 03/22/2022, 11:54 AM

## 2022-03-22 NOTE — Progress Notes (Signed)
PROGRESS NOTE [name]  OLM:[id number] DOB: April 08, 1927 DOA: 03/18/2022 PCP: John Giovanni, MD   Brief Narrative/Hospital Course: 88 y.o. female with medical history significant for persistent A FIB/a Flutter previously on amiodarone, hypertension, severe MR/moderate to severe TR, pulmonary hypertension, LBBB B, DM-diet controlled, COPD recent hospitalization 8/28-01/26/2022 for right hip fracture with A-fib RVR/acute systolic CHF with EF 35-40%, postop anemia who had sustained a new fall at home and right femur prosthesis fracture, no syncope or loss of consciousness reported.  In the ED vital signs showed elevated blood pressure 190/80 hemoglobin 8.5. Dr. Jena Gauss at [facility] was consulted.  Prior to surgery, hemoglobin further trended down to 5.7 g, and 2 units PRBC transfused. On 03/19/22: S/p ORIF right periprosthetic femur fracture by Dr Jena Gauss and debridement and VAC application of right foot necrotic ulcer by Dr Lajoyce Corners.    Subjective: Patient seen and examined at bedside.  No new complaints.  Daughter was at bedside.   Assessment and Plan: Principal Problem:   Femur fracture, right (HCC) Active Problems:   Benign essential HTN   Iron deficiency anemia   Closed fracture of right hip (HCC)   Pressure injury of skin   Heel ulcer, right, with necrosis of bone (HCC)   Acute blood loss anemia in the setting of chronic anemia Iron deficiency anemia Acute drop in hemoglobin likely multifactorial in the setting of chronic anemia, iron deficiency anemia, recent acute blood loss anemia in the setting of hip fracture and surgery on last admission S/p 2 units transfusion, transfused another unit on 03/21/2022 as hemoglobin drop to less than 8, now stable Monitor H&H while resuming on Eliquis> will benefit with transfusion if h/h drops below 8    Right periprosthetic femur fracture s/p fall S/p ORIF right periprosthetic femur fracture by Dr Jena Gauss Continue postop care pain control PT OT DVT  prophylaxis-resuming Eliquis 2.5 mg bid  Necrotic right foot decubitus ulcer s/p debridement and VAC application by Dr Lajoyce Corners Continue [facility] care, she will go on Prevena wound VAC.   Vitamind D deficiency: replacement started w/ high-dose weekly.  Recent systolic CHF with EF 35-40% MR/TR Pulm hypertension: BP is controlled.  Volume status stable. Chest x-ray with patchy opacities in the RLL -encourage incentive spirometry,PT OT Continue home PRN  Lasix, cont home amio, metoprolol at 50 mg w/ holding parameters.   Paroxysmal A-fib, on Eliquis at home, resumed 10/28. Cont metoprololo, amio   COPD/former smoker continue inhaler- not on o2, has diminished BS. Cont prn nebs, inhalers,  as needed   Anxiety/depression-mood stable   Diet controlled diabetes: Diet controlled   Moderate malnutrition:Body mass index is 18.31 kg/m Dietitian consulted.  Pressure injury sacrum stage II POA continue wound care foam dressing  DVT prophylaxis: apixaban (ELIQUIS) tablet 2.5 mg Start: 03/20/22 1045 SCDs Start: 03/19/22 1131 SCDs Start: 03/18/22 1548 Code Status:   Code Status: DNR Family Communication: Daughter at bedside   Patient status is: Inpatient because of hip fracture Level of care: Med-Surg  Dispo: The patient is from: Home            Anticipated disposition: To SNF  Mobility Assessment (last 72 hours)     Mobility Assessment     Row Name 03/22/22 1350 03/22/22 0900 03/21/22 2005 03/20/22 1535 03/20/22 1231   Does patient have an order for bedrest or is patient medically unstable -- No - Continue assessment Yes- Bedfast (Level 1) - Complete -- --   What is the highest level of mobility based on  the progressive mobility assessment? Level 5 (Walks with assist in room/hall) - Balance while stepping forward/back and can walk in room with assist - Complete Level 3 (Stands with assist) - Balance while standing  and cannot march in place Level 3 (Stands with assist) - Balance while  standing  and cannot march in place Level 3 (Stands with assist) - Balance while standing  and cannot march in place Level 5 (Walks with assist in room/hall) - Balance while stepping forward/back and can walk in room with assist - Complete   Is the above level different from baseline mobility prior to current illness? -- Yes - Recommend PT order Yes - Recommend PT order -- --           Objective: Vitals last 24 hrs: Vitals:   03/22/22 0405 03/22/22 0500 03/22/22 0734 03/22/22 1535  BP: (!) 135/47  (!) 168/59 (!) 149/54  Pulse: (!) 57  (!) 55 (!) 58  Resp: 15     Temp: 98.3 F (36.8 C)  97.8 F (36.6 C) 98.6 F (37 C)  TempSrc: Oral  Oral Oral  SpO2: 99%  100% 99%  Weight:  53.7 kg    Height:       Weight change:   Physical Examination: General: NAD, alert, oriented Cardiovascular: S1, S2 present Respiratory: CTAB Abdomen: Soft, nontender, nondistended, bowel sounds present Musculoskeletal: No bilateral pedal edema noted, right foot with wound VAC in place Skin: Normal Psychiatry: Normal mood   Medications reviewed:  Scheduled Meds:  acetaminophen  650 mg Oral Q8H   amiodarone  200 mg Oral Daily   apixaban  2.5 mg Oral BID   atorvastatin  40 mg Oral q1800   docusate sodium  100 mg Oral BID   ferrous sulfate  325 mg Oral Q breakfast   insulin aspart  0-6 Units Subcutaneous TID WC   levothyroxine  25 mcg Oral QAC breakfast   metoprolol succinate  50 mg Oral Q breakfast   pantoprazole  40 mg Oral Daily   senna-docusate  1 tablet Oral BID   umeclidinium bromide  1 puff Inhalation Daily   Vitamin D (Ergocalciferol)  50,000 Units Oral Q7 days   Continuous Infusions:  methocarbamol (ROBAXIN) IV     Diet Order             Diet Heart Room service appropriate? Yes; Fluid consistency: Thin  Diet effective now                  Intake/Output Summary (Last 24 hours) at 03/22/2022 1621 Last data filed at 03/22/2022 0600 Gross per 24 hour  Intake 644 ml  Output  875 ml  Net -231 ml   Net IO Since Admission: 3,499.6 mL [03/22/22 1621]  Wt Readings from Last 3 Encounters:  03/22/22 53.7 kg  02/23/22 49.9 kg  01/26/22 50.6 kg     Unresulted Labs (From admission, onward)     Start     Ordered   03/19/22 [id number]  Basic metabolic panel  Daily at 5am,   R      03/18/22 1548   03/19/22 0500  CBC  Daily at 5am,   R      03/18/22 1548   03/18/22 1548  Urinalysis, Routine w reflex microscopic  Once,   R        03/18/22 1548          Data Reviewed: I have personally reviewed following labs and imaging studies CBC: Recent Labs  Lab  03/19/22 0110 03/20/22 0244 03/21/22 0339 03/21/22 1842 03/22/22 0538  WBC 8.8 9.9 10.5 10.1 8.7  HGB 8.7* 8.0* 7.8* 10.3* 10.0*  HCT 27.4* 24.0* 23.7* 31.1* 30.9*  MCV 87.5 84.8 86.2 86.4 85.8  PLT 178 190 205 244 734   Basic Metabolic Panel: Recent Labs  Lab 03/18/22 1354 03/19/22 0110 03/20/22 0244 03/21/22 0339 03/22/22 0538  NA 138 137 134* 137 134*  K 4.4 4.3 4.4 4.3 4.3  CL 103 107 103 107 105  CO2 25 25 23 24 24   GLUCOSE 94 120* 161* 96 98  BUN 20 21 22 22 18   CREATININE 0.92 0.89 0.88 0.87 0.79  CALCIUM 8.1* 7.8* 7.6* 7.6* 7.7*  CBG: Recent Labs  Lab 03/21/22 1741 03/21/22 2127 03/22/22 0855 03/22/22 1130 03/22/22 1614  GLUCAP 108* 111* 85 110* 123*   Antimicrobials: Anti-infectives (From admission, onward)    Start     Dose/Rate Route Frequency Ordered Stop   03/19/22 1600  ceFAZolin (ANCEF) IVPB 2g/100 mL premix        2 g 200 mL/hr over 30 Minutes Intravenous Every 8 hours 03/19/22 1130 03/20/22 1036   03/19/22 0737  ceFAZolin (ANCEF) 2-4 GM/100ML-% IVPB       Note to Pharmacy: Tamala Fothergill S: cabinet override      03/19/22 0737 03/19/22 1944   03/18/22 1415  piperacillin-tazobactam (ZOSYN) IVPB 3.375 g  Status:  Discontinued        3.375 g 100 mL/hr over 30 Minutes Intravenous  Once 03/18/22 1401 03/18/22 1644      Culture/Microbiology No results found for: "SDES",  "SPECREQUEST", "CULT", "REPTSTATUS"  Other culture-see note  Radiology Studies: No results found.   LOS: 4 days   Alma Friendly, MD [facility]  03/22/2022, 4:21 PM

## 2022-03-22 NOTE — Anesthesia Postprocedure Evaluation (Signed)
Anesthesia Post Note  Patient: [name]  Procedure(s) Performed: OPEN REDUCTION INTERNAL FIXATION RIGHT FEMUR FRACTURE (Right) IRRIGATION AND DEBRIDEMENT FOOT (Right: Heel) APPLICATION OF WOUND VAC (Right: Heel)     Patient location during evaluation: PACU Anesthesia Type: General Level of consciousness: sedated and patient cooperative Pain management: pain level controlled Vital Signs Assessment: post-procedure vital signs reviewed and stable Respiratory status: spontaneous breathing Cardiovascular status: stable Anesthetic complications: no   No notable events documented.  Last Vitals:  Vitals:   03/22/22 0734 03/22/22 1535  BP: (!) 168/59 (!) 149/54  Pulse: (!) 55 (!) 58  Resp:    Temp: 36.6 C 37 C  SpO2: 100% 99%    Last Pain:  Vitals:   03/22/22 1535  TempSrc: Oral  PainSc:                  Nolon Nations

## 2022-03-22 NOTE — TOC Progression Note (Signed)
Transition of Care [facility]) - Progression Note    Patient Details  Name: [name] MRN: [id number] Date of Birth: [birth year]  [facility]) CM/SW Contact  Joanne Chars, LCSW Phone Number: 03/22/2022, 1:57 PM  Clinical Narrative:   Bed offers presented to pt and daughter Juliann Pulse, daughter Lelon Frohlich on speakerphone.  Per Alyse Low at [facility], they can offer but they do have several cases of covid in the building.  This information was given to family, along with other bed offers.  Per Lelon Frohlich, they still want to accept offer at [place] informed.  Auth request submitted in [place].      Expected Discharge Plan: [place] Barriers to Discharge: Continued Medical Work up  Expected Discharge Plan and Services Expected Discharge Plan: [place] arrangements for the past 2 months: Single Family Home                                       Social Determinants of Health (SDOH) Interventions    Readmission Risk Interventions     No data to display

## 2022-03-22 NOTE — Progress Notes (Signed)
Physical Therapy Treatment Patient Details Name: [name] MRN: [id number] DOB: 1926-12-15 Today's Date: 03/22/2022   History of Present Illness 88 yo female presents to [facility] on 10/26 s/p fall with xray showing Right peri-implant femoral shaft fracture, s/p IMN R hip intertroch 01/20/2022. Pt also with bilat heel wounds. s/p ORIF R periprosthetic femur fx, removal of hardware as well as xcisional debridement of skin and soft tissue muscle and fascia R heel on 10/27. PMH inlcudes anxiety, afib, HF, COPD, depression, HTN, DMII.    PT Comments    Pt continues to need min A to come  to EOB from supine but once move initiated, she is improving in the ability to move RLE on her own. Min A to stand with RW. Pt able to progress ambulation to 50' with RW and min A. One LOB to side with min A to correct. Continue to recommend SNF for rehab at d/c. PT will continue to follow.    Recommendations for follow up therapy are one component of a multi-disciplinary discharge planning process, led by the attending physician.  Recommendations may be updated based on patient status, additional functional criteria and insurance authorization.  Follow Up Recommendations  Skilled nursing-short term rehab (<3 hours/day) (vs home with 24/7 support from family) Can patient physically be transported by private vehicle: Yes   Assistance Recommended at Discharge Frequent or constant Supervision/Assistance  Patient can return home with the following A little help with walking and/or transfers;A little help with bathing/dressing/bathroom;Direct supervision/assist for medications management;Assistance with cooking/housework;Assist for transportation;Help with stairs or ramp for entrance   Equipment Recommendations  None recommended by PT    Recommendations for Other Services       Precautions / Restrictions Precautions Precautions: Fall Precaution Comments: wound VAC R heel, HOH Restrictions Weight Bearing  Restrictions: Yes RLE Weight Bearing: Weight bearing as tolerated     Mobility  Bed Mobility Overal bed mobility: Needs Assistance Bed Mobility: Supine to Sit     Supine to sit: Min assist     General bed mobility comments: min HHA initially but then pt able to bring trunk up on her own and scoot hips to EOB with vc's.    Transfers Overall transfer level: Needs assistance Equipment used: Rolling walker (2 wheels) Transfers: Sit to/from Stand Sit to Stand: Min assist           General transfer comment: min A to steady and vc's for hand placement    Ambulation/Gait Ambulation/Gait assistance: Min assist, +2 safety/equipment Gait Distance (Feet): 50 Feet Assistive device: Rolling walker (2 wheels) Gait Pattern/deviations: Decreased step length - right, Antalgic, Trunk flexed, Step-through pattern Gait velocity: decr Gait velocity interpretation: <1.8 ft/sec, indicate of risk for recurrent falls   General Gait Details: min A to steady with one instance of LOB, needed min A to correct. +2 for chair follow. Pt tolerated increase distance today without significant increased in pain   Stairs             Wheelchair Mobility    Modified Rankin (Stroke Patients Only)       Balance Overall balance assessment: Needs assistance, History of Falls Sitting-balance support: Bilateral upper extremity supported Sitting balance-Leahy Scale: Fair     Standing balance support: Bilateral upper extremity supported, During functional activity Standing balance-Leahy Scale: Poor                              Cognition Arousal/Alertness:  Awake/alert Behavior During Therapy: WFL for tasks assessed/performed Overall Cognitive Status: Within Functional Limits for tasks assessed                         Following Commands: Follows multi-step commands consistently, Follows one step commands consistently       General Comments: pt stayed on task today,  appropriate for age. Sometimes difficult to assess with [facility]        Exercises General Exercises - Lower Extremity Ankle Circles/Pumps: AROM, Both, 10 reps, Seated Heel Slides: AAROM, Right, 10 reps, Seated (R knee tight) Straight Leg Raises: AAROM, Right, 5 reps, Seated    General Comments General comments (skin integrity, edema, etc.): VSS on RA      Pertinent Vitals/Pain Pain Assessment Pain Assessment: Faces Faces Pain Scale: Hurts a little bit Pain Location: R hip Pain Descriptors / Indicators: Grimacing, Guarding Pain Intervention(s): Monitored during session    Home Living                          Prior Function            PT Goals (current goals can now be found in the care plan section) Acute Rehab PT Goals Patient Stated Goal: home PT Goal Formulation: With patient Time For Goal Achievement: 04/03/22 Potential to Achieve Goals: Good Progress towards PT goals: Progressing toward goals    Frequency    Min 3X/week      PT Plan Current plan remains appropriate    Co-evaluation              AM-PAC PT "6 Clicks" Mobility   Outcome Measure  Help needed turning from your back to your side while in a flat bed without using bedrails?: A Little Help needed moving from lying on your back to sitting on the side of a flat bed without using bedrails?: A Lot Help needed moving to and from a bed to a chair (including a wheelchair)?: A Lot Help needed standing up from a chair using your arms (e.g., wheelchair or bedside chair)?: A Little Help needed to walk in hospital room?: A Lot Help needed climbing 3-5 steps with a railing? : Total 6 Click Score: 13    End of Session Equipment Utilized During Treatment: Gait belt Activity Tolerance: Patient tolerated treatment well Patient left: with call bell/phone within reach;in chair;with chair alarm set;with family/visitor present Nurse Communication: Mobility status PT Visit Diagnosis: Other  abnormalities of gait and mobility (R26.89);Muscle weakness (generalized) (M62.81);Repeated falls (R29.6)     Time: 3557-3220 PT Time Calculation (min) (ACUTE ONLY): 21 min  Charges:  $Gait Training: 8-22 mins                     Leighton Roach, PT  Acute Rehab Services Secure chat preferred Office [place] 03/22/2022, 2:00 PM

## 2022-03-22 NOTE — Progress Notes (Signed)
Orthopaedic Trauma Progress Note  SUBJECTIVE: Doing well. Pain controlled. Progressing well with therapies. No chest pain. No SOB. No nausea/vomiting. No other complaints. Daughter at bedside  OBJECTIVE:  Vitals:   03/22/22 0405 03/22/22 0734  BP: (!) 135/47 (!) 168/59  Pulse: (!) 57 (!) 55  Resp: 15   Temp: 98.3 F (36.8 C) 97.8 F (36.6 C)  SpO2: 99% 100%    General: Sitting up in bed, NAD Respiratory: No increased work of breathing.  RLE: Dressing removed, incisions CDI with Dermabond in place. No significant tenderness with palpation over the hip or throughout the thigh. Ankle DF/PF intact. Tolerates gentle knee motion. Comparments soft and compressible. Able to wiggle toes. + DP pulse  IMAGING: Stable post op imaging.   LABS:  Results for orders placed or performed during the hospital encounter of 03/18/22 (from the past 24 hour(s))  Prepare RBC (crossmatch)     Status: None   Collection Time: 03/21/22  8:36 AM  Result Value Ref Range   Order Confirmation      ORDER PROCESSED BY BLOOD BANK ALREADY HAVE 1 UNIT AVAILABLE FROM A PREVIOUS ORDER. Performed at [facility], [place] [address] [place], [place] [address]   Glucose, capillary     Status: None   Collection Time: 03/21/22 11:41 AM  Result Value Ref Range   Glucose-Capillary 99 70 - 99 mg/dL  Glucose, capillary     Status: Abnormal   Collection Time: 03/21/22  5:41 PM  Result Value Ref Range   Glucose-Capillary 108 (H) 70 - 99 mg/dL  CBC     Status: Abnormal   Collection Time: 03/21/22  6:42 PM  Result Value Ref Range   WBC 10.1 4.0 - 10.5 K/uL   RBC 3.60 (L) 3.87 - 5.11 MIL/uL   Hemoglobin 10.3 (L) 12.0 - 15.0 g/dL   HCT 31.1 (L) 36.0 - 46.0 %   MCV 86.4 80.0 - 100.0 fL   MCH 28.6 26.0 - 34.0 pg   MCHC 33.1 30.0 - 36.0 g/dL   RDW 15.6 (H) 11.5 - 15.5 %   Platelets 244 150 - 400 K/uL   nRBC 0.0 0.0 - 0.2 %  Glucose, capillary     Status: Abnormal   Collection Time: 03/21/22  9:27 PM  Result Value Ref  Range   Glucose-Capillary 111 (H) 70 - 99 mg/dL  Basic metabolic panel     Status: Abnormal   Collection Time: 03/22/22  5:38 AM  Result Value Ref Range   Sodium 134 (L) 135 - 145 mmol/L   Potassium 4.3 3.5 - 5.1 mmol/L   Chloride 105 98 - 111 mmol/L   CO2 24 22 - 32 mmol/L   Glucose, Bld 98 70 - 99 mg/dL   BUN 18 8 - 23 mg/dL   Creatinine, Ser 0.79 0.44 - 1.00 mg/dL   Calcium 7.7 (L) 8.9 - 10.3 mg/dL   GFR, Estimated >60 >60 mL/min   Anion gap 5 5 - 15  CBC     Status: Abnormal   Collection Time: 03/22/22  5:38 AM  Result Value Ref Range   WBC 8.7 4.0 - 10.5 K/uL   RBC 3.60 (L) 3.87 - 5.11 MIL/uL   Hemoglobin 10.0 (L) 12.0 - 15.0 g/dL   HCT 30.9 (L) 36.0 - 46.0 %   MCV 85.8 80.0 - 100.0 fL   MCH 27.8 26.0 - 34.0 pg   MCHC 32.4 30.0 - 36.0 g/dL   RDW 15.2 11.5 - 15.5 %  Platelets 258 150 - 400 K/uL   nRBC 0.0 0.0 - 0.2 %    ASSESSMENT: RIELLA LILLIS is a 88 y.o. female, 3 Days Post-Op s/p OPEN REDUCTION INTERNAL FIXATION RIGHT PERIPROSTHETIC FEMUR FRACTURE IRRIGATION AND DEBRIDEMENT FOOT WITH APPLICATION OF WOUND VAC by Dr. Sharol Given  CV/Blood loss: Acute blood loss anemia, Hgb 10.0 this AM.  Received 1 unit PRBCs 03/21/22. Hemodynamically stable  PLAN: Weightbearing: WBAT RLE ROM:  Ok for unrestricted ROM  Incisional and dressing care:  Ok to leave incisions open to air Showering:  ok to get incision wet when showering Orthopedic device(s): Wound vac R heel by DR. Duda  Pain management:  1. Tylenol 650 mg q 8 hours scheduled 2. Robaxin 500 mg q 6 hours PRN 3. Norco 5-325 mg q 4 hours PRN 4. Morphine 0.5-1 mg q 2 hours PRN VTE prophylaxis:  Eliquis , SCDs ID:  Ancef 2gm post op completd Foley/Lines:  No foley, KVO IVFs Impediments to Fracture Healing: Vit D level 22, started on supplementation Dispo: Therapies as tolerated. PT/OT recommending SNF. Patient and family agreeable. Okay for discharge from ortho standpoint once cleared by medicine team and therapies  D/C  recommendations: - Norco 5-325 mg for pain control - Continue home dose Eliquis for DVT prophylaxis - Continue Vit D supplementation x8 weeks  Follow - up plan: 2 weeks after d/c for wound check and repeat x-rays   Contact information:  Katha Hamming MD, Rushie Nyhan PA-C. After hours and holidays please check [email or web address] for group call information for [facility]   Gwinda Passe, PA-C [phone] (office) [email or web address]

## 2022-03-22 NOTE — Progress Notes (Signed)
Transition of Care [facility]) - CAGE-AID Screening   Patient Details  Name: [name] MRN: [id number] Date of Birth: [birth year]    Elvina Sidle, RN Trauma Response Nurse Phone Number: [phone] 03/22/2022, 4:08 PM   CAGE-AID Screening:    Have You Ever Felt You Ought to Cut Down on Your Drinking or Drug Use?: No Have People Annoyed You By Critizing Your Drinking Or Drug Use?: No Have You Felt Bad Or Guilty About Your Drinking Or Drug Use?: No Have You Ever Had a Drink or Used Drugs First Thing In The Morning to Steady Your Nerves or to Get Rid of a Hangover?: No CAGE-AID Score: 0  Substance Abuse Education Offered: No (pt does not drink alcohol-- states that her daughter answers all of her questions now)

## 2022-03-23 ENCOUNTER — Encounter (HOSPITAL_COMMUNITY): Payer: Self-pay | Admitting: Student

## 2022-03-23 DIAGNOSIS — I10 Essential (primary) hypertension: Secondary | ICD-10-CM | POA: Diagnosis not present

## 2022-03-23 DIAGNOSIS — S72001A Fracture of unspecified part of neck of right femur, initial encounter for closed fracture: Secondary | ICD-10-CM | POA: Diagnosis not present

## 2022-03-23 DIAGNOSIS — L97414 Non-pressure chronic ulcer of right heel and midfoot with necrosis of bone: Secondary | ICD-10-CM | POA: Diagnosis not present

## 2022-03-23 DIAGNOSIS — S72351A Displaced comminuted fracture of shaft of right femur, initial encounter for closed fracture: Secondary | ICD-10-CM | POA: Diagnosis not present

## 2022-03-23 LAB — BASIC METABOLIC PANEL
Anion gap: 6 (ref 5–15)
BUN: 17 mg/dL (ref 8–23)
CO2: 25 mmol/L (ref 22–32)
Calcium: 7.8 mg/dL — ABNORMAL LOW (ref 8.9–10.3)
Chloride: 104 mmol/L (ref 98–111)
Creatinine, Ser: 0.73 mg/dL (ref 0.44–1.00)
GFR, Estimated: >60 mL/min (ref >60)
Glucose, Bld: 107 mg/dL — ABNORMAL HIGH (ref 70–99)
Potassium: 4 mmol/L (ref 3.5–5.1)
Sodium: 135 mmol/L (ref 135–145)

## 2022-03-23 LAB — CBC
HCT: 33.6 % — ABNORMAL LOW (ref 36.0–46.0)
Hemoglobin: 10.8 g/dL — ABNORMAL LOW (ref 12.0–15.0)
MCH: 28.1 pg (ref 26.0–34.0)
MCHC: 32.1 g/dL (ref 30.0–36.0)
MCV: 87.3 fL (ref 80.0–100.0)
Platelets: 269 10*3/uL (ref 150–400)
RBC: 3.85 MIL/uL — ABNORMAL LOW (ref 3.87–5.11)
RDW: 15.1 % (ref 11.5–15.5)
WBC: 10.1 10*3/uL (ref 4.0–10.5)
nRBC: 0 % (ref 0.0–0.2)

## 2022-03-23 LAB — GLUCOSE, CAPILLARY
Glucose-Capillary: 82 mg/dL (ref 70–99)
Glucose-Capillary: 90 mg/dL (ref 70–99)
Glucose-Capillary: 158 mg/dL — ABNORMAL HIGH (ref 70–99)
Glucose-Capillary: 113 mg/dL — ABNORMAL HIGH (ref 70–99)

## 2022-03-23 MED ORDER — ALPRAZOLAM 0.25 MG PO TABS: 0.2500 mg | ORAL_TABLET | Freq: Every evening | ORAL | 0 refills | Status: AC | PRN

## 2022-03-23 MED ORDER — METOPROLOL SUCCINATE ER 50 MG PO TB24: 50.0000 mg | ORAL_TABLET | Freq: Every day | ORAL | Status: AC

## 2022-03-23 MED ORDER — VITAMIN D (ERGOCALCIFEROL) 1.25 MG (50000 UNIT) PO CAPS: 50000.0000 [IU] | ORAL_CAPSULE | ORAL | Status: AC

## 2022-03-23 NOTE — TOC Transition Note (Signed)
Transition of Care [facility]) - CM/SW Discharge Note   Patient Details  Name: [name] MRN: [id number] Date of Birth: Nov 12, 1926  [facility]) CM/SW Contact:  Joanne Chars, LCSW Phone Number: 03/23/2022, 3:22 PM   Clinical Narrative:   Pt discharging to [facility].  RN call report to [phone] for report.     Final next level of care: Skilled Nursing Facility Barriers to Discharge: Barriers Resolved   Patient Goals and CMS Choice Patient states their goals for this hospitalization and ongoing recovery are:: SNF CMS Medicare.gov Compare Post Acute Care list provided to:: Patient Represenative (must comment) (patients daughter Lelon Frohlich) Choice offered to / list presented to : Adult Children (Patients daughter Lelon Frohlich)  Discharge Placement              Patient chooses bed at:  [facility]) Patient to be transferred to facility by: [place] Name of family member notified: daughters Lelon Frohlich and Juliann Pulse in room Patient and family notified of of transfer: 03/23/22  Discharge Plan and Services                                     Social Determinants of Health (SDOH) Interventions     Readmission Risk Interventions     No data to display

## 2022-03-23 NOTE — Progress Notes (Signed)
Called report to Nurse Candace at [place] is at [facility] telephone number [phone].

## 2022-03-23 NOTE — Progress Notes (Signed)
Attempted to call report to [facility] at [phone].  Unable to contact person.  No voicemail available.

## 2022-03-23 NOTE — TOC Progression Note (Addendum)
Transition of Care [facility]) - Progression Note    Patient Details  Name: [name] MRN: [id number] Date of Birth: Apr 28, 1927  Transition of Care [facility]) CM/SW Contact  Joanne Chars, LCSW Phone Number: 03/23/2022, 12:46 PM  Clinical Narrative:   SNF auth approved in [place]: [id number], [id number], 3 days: 10/31-11/2.  CSW spoke to daughters Lelon Frohlich and Juliann Pulse about providing transportation.  They do not feel at all comfortable with that and want ambulance transport.  Discussed that pt has been screened as being able to go by private vehicle and they could be billed and they are aware of this and still want to move forward with the ambulance.     Expected Discharge Plan: [place] Barriers to Discharge: Continued Medical Work up  Expected Discharge Plan and Services Expected Discharge Plan: [place] arrangements for the past 2 months: Single Family Home                                       Social Determinants of Health (SDOH) Interventions    Readmission Risk Interventions     No data to display

## 2022-03-23 NOTE — Progress Notes (Signed)
Physical Therapy Treatment Patient Details Name: [name] MRN: [id number] DOB: Apr 18, 1927 Today's Date: 03/23/2022   History of Present Illness 88 yo female presents to [facility] on 10/26 s/p fall with xray showing Right peri-implant femoral shaft fracture, s/p IMN R hip intertroch 01/20/2022. Pt also with bilat heel wounds. s/p ORIF R periprosthetic femur fx, removal of hardware as well as xcisional debridement of skin and soft tissue muscle and fascia R heel on 10/27. PMH inlcudes anxiety, afib, HF, COPD, depression, HTN, DMII.    PT Comments    Pt continues to progress with tolerance for mobility but also continues to have safety issues with mobility and needs consistent min A. Continue to recommend SNF for rehab. Follow    Recommendations for follow up therapy are one component of a multi-disciplinary discharge planning process, led by the attending physician.  Recommendations may be updated based on patient status, additional functional criteria and insurance authorization.  Follow Up Recommendations  Skilled nursing-short term rehab (<3 hours/day) (vs home with 24/7 support from family) Can patient physically be transported by private vehicle: Yes   Assistance Recommended at Discharge Frequent or constant Supervision/Assistance  Patient can return home with the following A little help with walking and/or transfers;A little help with bathing/dressing/bathroom;Direct supervision/assist for medications management;Assistance with cooking/housework;Assist for transportation;Help with stairs or ramp for entrance   Equipment Recommendations  None recommended by PT    Recommendations for Other Services       Precautions / Restrictions Precautions Precautions: Fall Precaution Comments: wound VAC R heel, HOH Restrictions Weight Bearing Restrictions: Yes RLE Weight Bearing: Weight bearing as tolerated     Mobility  Bed Mobility Overal bed mobility: Needs Assistance Bed Mobility:  Supine to Sit     Supine to sit: Min guard     General bed mobility comments: pt able to come to EOB with increased time and cues for sequencing but no physical assist with HOB elevated    Transfers Overall transfer level: Needs assistance Equipment used: Rolling walker (2 wheels) Transfers: Sit to/from Stand Sit to Stand: Min assist           General transfer comment: 1 posterior LOB when standing from toilet and hand to return to sitting. Has difficulty with fwd wt shift each time in part due to limited R knee flexion. Uses lift chair function at home    Ambulation/Gait Ambulation/Gait assistance: Min assist Gait Distance (Feet): 85 Feet (10', 75') Assistive device: Rolling walker (2 wheels) Gait Pattern/deviations: Decreased step length - right, Antalgic, Trunk flexed, Step-through pattern Gait velocity: decr Gait velocity interpretation: >2.62 ft/sec, indicative of community ambulatory   General Gait Details: min A for safety, pt with quick pace, sometimes gets off balance   Stairs             Wheelchair Mobility    Modified Rankin (Stroke Patients Only)       Balance Overall balance assessment: Needs assistance, History of Falls Sitting-balance support: Bilateral upper extremity supported Sitting balance-Leahy Scale: Fair   Postural control: Posterior lean Standing balance support: Bilateral upper extremity supported, During functional activity Standing balance-Leahy Scale: Poor                              Cognition Arousal/Alertness: Awake/alert Behavior During Therapy: WFL for tasks assessed/performed Overall Cognitive Status: Within Functional Limits for tasks assessed  General Comments: very HOH, STM deficits noted but appropriate throughout session        Exercises General Exercises - Lower Extremity Ankle Circles/Pumps: AROM, Both, 10 reps, Seated Long Arc Quad: AROM, 10 reps,  Seated, Both Hip ABduction/ADduction: AROM, Both, 10 reps, Seated Straight Leg Raises: AAROM, Right, Seated, AROM, Left, 10 reps    General Comments General comments (skin integrity, edema, etc.): VSS on RA      Pertinent Vitals/Pain Pain Assessment Pain Assessment: Faces Faces Pain Scale: Hurts a little bit Pain Location: R hip Pain Descriptors / Indicators: Grimacing, Guarding Pain Intervention(s): Monitored during session    Home Living                          Prior Function            PT Goals (current goals can now be found in the care plan section) Acute Rehab PT Goals Patient Stated Goal: home PT Goal Formulation: With patient Time For Goal Achievement: 04/03/22 Potential to Achieve Goals: Good Progress towards PT goals: Progressing toward goals    Frequency    Min 3X/week      PT Plan Current plan remains appropriate    Co-evaluation              AM-PAC PT "6 Clicks" Mobility   Outcome Measure  Help needed turning from your back to your side while in a flat bed without using bedrails?: A Little Help needed moving from lying on your back to sitting on the side of a flat bed without using bedrails?: A Little Help needed moving to and from a bed to a chair (including a wheelchair)?: A Little Help needed standing up from a chair using your arms (e.g., wheelchair or bedside chair)?: A Little Help needed to walk in hospital room?: A Little Help needed climbing 3-5 steps with a railing? : Total 6 Click Score: 16    End of Session Equipment Utilized During Treatment: Gait belt Activity Tolerance: Patient tolerated treatment well Patient left: with call bell/phone within reach;in chair;with chair alarm set Nurse Communication: Mobility status PT Visit Diagnosis: Other abnormalities of gait and mobility (R26.89);Muscle weakness (generalized) (M62.81);Repeated falls (R29.6)     Time: [id number] PT Time Calculation (min) (ACUTE ONLY): 28  min  Charges:  $Gait Training: 23-37 mins                     Leighton Roach, [place] chat preferred Office [place] 03/23/2022, 11:58 AM

## 2022-03-23 NOTE — Discharge Summary (Signed)
Physician Discharge Summary   Patient: [name] MRN: [id number] DOB: Oct 20, 1926  Admit date:     03/18/2022  Discharge date: 03/23/22  Discharge Physician: Oswald Hillock   PCP: Leslie Andrea, MD   Recommendations at discharge:   Follow-up orthopedics as outpatient  Discharge Diagnoses: Principal Problem:   Femur fracture, right ([place]) Active Problems:   Benign essential HTN   Iron deficiency anemia   Closed fracture of right hip (HCC)   Pressure injury of skin   Heel ulcer, right, with necrosis of bone ([place])  Resolved Problems:   * No resolved hospital problems. *  Hospital Course: 88 y.o. female with medical history significant for Persistent A FIB/a Flutter previously on amiodarone, hypertension, severe MR/moderate to severe TR, pulmonary hypertension, LBBB B, DM-diet controlled, COPD recent hospitalization 8/28-01/26/2022 for right hip fracture with A-fib RVR/acute systolic CHF with EF 123456, postop anemia who had sustained a new fall at home and right femur diaphysis fracture, no syncope or loss of consciousness reported.  In the ED vital signs showed elevated blood pressure 190/80 hemoglobin 8.5 g, rest of your lites were normal, Dr. Doreatha Martin at [facility] was consulted and agreed with ED physician therefore [facility] transfer for ORIF.  Bed had opened up 10/25 but apparently patient was not n.p.o. surgery canceled, patient got a bed 03/17/22 but she was not n.p.o. so surgery was canceled. Patient arrived to short stay where routine labs showed hemoglobin 5.7 g, 2 units PRBC ordered surgery canceled and admission was requested. In [facility] [place] apparently give rocephin x1 10/26 for ?uti and has foley in place.She is hard of hearing. Patient was admitted and orthopedic podiatry consulted.  Received 2 units PRBC 03/19/22: S/p ORIF right periprosthetic femur fracture Dr Doreatha Martin and debridement and VAC application of right foot necrotic ulcer Dr Sharol Given.  Assessment and Plan:  Acute blood  loss anemia in the setting of chronic anemia Iron deficiency anemia Acute drop in hemoglobin likely multifactorial in the setting of chronic anemia, iron deficiency anemia, recent acute blood loss anemia in the setting of hip fracture and surgery on last admission S/p 2 units transfusion, transfused another unit on 03/21/2022 as hemoglobin drop to less than 8, now stable  Right periprosthetic femur fracture s/p fall S/p ORIF right periprosthetic femur fracture by Dr Doreatha Martin Continue postop care pain control PT OT DVT prophylaxis -resuming Eliquis 2.5 mg bid   Necrotic right foot decubitus ulcer s/p debridement and VAC application by Dr Sharol Given Continue [facility] care, she will go on Prevena wound VAC.    Vitamind D deficiency: replacement started w/ high-dose weekly.  Replacement for 5 weeks only.   Recent systolic CHF with EF 123456 MR/TR Pulm hypertension: BP is controlled.  Volume status stable. Chest x-ray with patchy opacities in the RLL -encourage incentive spirometry,PT OT Continue home PRN  Lasix, cont home amio, metoprolol at 50 mg w/ holding parameters.   Paroxysmal A-fib, on Eliquis at home, resumed 10/28. Cont metoprololo, amiodarone   COPD/former smoker continue inhaler- not on o2, has diminished BS. Cont prn nebs, inhalers,as needed   Anxiety/depression-mood stable   Diet controlled diabetes: Diet controlled   Moderate malnutrition:Body mass index is 18.31 kg/m Dietitian consulted.   Pressure injury sacrum stage II POA continue wound care foam dressing      Consultants: Orthopedics Procedures performed: ORIF periprosthetic fracture, Disposition: Skilled nursing facility Diet recommendation:  Discharge Diet Orders (From admission, onward)     Start     Ordered   03/23/22  0000  Diet - low sodium heart healthy        03/23/22 1355           Regular diet DISCHARGE MEDICATION: Allergies as of 03/23/2022       Reactions   Chocolate Hives   Pork-derived  Products         Medication List     TAKE these medications    Accu-Chek Aviva Plus test strip Generic drug: glucose blood 1 each daily.   acetaminophen 650 MG CR tablet Commonly known as: TYLENOL Take 650 mg by mouth every 6 (six) hours as needed for pain.   Adjustable Lancing Device Misc USE TO check blood glucose   albuterol 108 (90 Base) MCG/ACT inhaler Commonly known as: VENTOLIN HFA Inhale 1-2 puffs into the lungs every 4 (four) hours as needed for wheezing or shortness of breath.   ALPRAZolam 0.25 MG tablet Commonly known as: XANAX Take 1 tablet (0.25 mg total) by mouth at bedtime as needed for anxiety.   amiodarone 200 MG tablet Commonly known as: PACERONE Take 1 tablet (200 mg total) by mouth daily.   atorvastatin 40 MG tablet Commonly known as: LIPITOR Take 40 mg by mouth daily at 6 PM.   bismuth subsalicylate 99991111 [id number] suspension Commonly known as: PEPTO BISMOL Take 30 mLs by mouth every 6 (six) hours as needed for indigestion.   COD LIVER OIL PO Take 1 capsule by mouth every 14 (fourteen) days.   Easy Comfort Lancets Misc TO test blood glucose ONCE daily   Eliquis 2.5 MG Tabs tablet Generic drug: apixaban TAKE (1) TABLET TWICE DAILY. What changed: See the new instructions.   feeding supplement Liqd Take 237 mLs by mouth 2 (two) times daily between meals. What changed: when to take this   ferrous sulfate 325 (65 FE) MG tablet Take 1 tablet (325 mg total) by mouth daily with breakfast.   furosemide 20 MG tablet Commonly known as: LASIX Take 1 tablet (20 mg total) by mouth daily as needed.   HYDROcodone-acetaminophen 5-325 MG tablet Commonly known as: NORCO/VICODIN Take 1 tablet by mouth every 6 (six) hours as needed for severe pain.   levothyroxine 25 MCG tablet Commonly known as: SYNTHROID Take 25 mcg by mouth daily before breakfast.   losartan 25 MG tablet Commonly known as: COZAAR Take 1 tablet (25 mg total) by mouth daily.    Medihoney Wound/Burn Dressing Gel Apply 1 Application topically 2 (two) times daily.   metoprolol succinate 50 MG 24 hr tablet Commonly known as: TOPROL-XL Take 1 tablet (50 mg total) by mouth daily with breakfast. Take with or immediately following a meal. Start taking on: March 24, 2022 What changed:  medication strength how much to take when to take this   pantoprazole 40 MG tablet Commonly known as: PROTONIX Take 1 tablet (40 mg total) by mouth daily.   polyethylene glycol 17 g packet Commonly known as: MIRALAX / GLYCOLAX Take 17 g by mouth daily as needed for mild constipation. What changed:  when to take this reasons to take this   senna-docusate 8.6-50 MG tablet Commonly known as: Senokot-S Take 1 tablet by mouth 2 (two) times daily for 10 days.   THERA-M ENHANCED PO Take 1 tablet by mouth daily.   Vitamin D (Ergocalciferol) 1.25 MG (50000 UNIT) Caps capsule Commonly known as: DRISDOL Take 1 capsule (50,000 Units total) by mouth every 7 (seven) days. Start taking on: March 26, 2022   Vitamin D3 25 MCG (1000  UT) Caps Take 1,000 Units by mouth daily.               Discharge Care Instructions  (From admission, onward)           Start     Ordered   03/23/22 0000  Discharge wound care:       Comments: Right heel wound  VAC dressing to a Praveena plus portable wound VAC pump for discharge.   03/23/22 1355            Follow-up Information     Newt Minion, MD Follow up in 1 week(s).   Specialty: Orthopedic Surgery Contact information: [address] [place] [address] [phone]                Discharge Exam: Danley Danker Weights   03/18/22 1408 03/22/22 0500  Weight: 49.9 kg 53.7 kg   General-appears in no acute distress Heart-S1-S2, regular, no murmur auscultated Lungs-clear to auscultation bilaterally, no wheezing or crackles auscultated Abdomen-soft, nontender, no organomegaly Extremities-no edema in the lower  extremities Neuro-alert, oriented x3, no focal deficit noted  Condition at discharge: good  The results of significant diagnostics from this hospitalization (including imaging, microbiology, ancillary and laboratory) are listed below for reference.   Imaging Studies: DG FEMUR PORT, MIN 2 VIEWS RIGHT  Result Date: 03/19/2022 CLINICAL DATA:  Right femur ORIF. EXAM: RIGHT FEMUR PORTABLE 2 VIEW COMPARISON:  CT right femur dated March 17, 2022. FINDINGS: New lateral plate and screw fixation of the oblique periprosthetic femoral diaphyseal fracture, now in near anatomic alignment. A long anterior diaphyseal cortical fragment remains slightly displaced anteriorly. No evidence of hardware failure or loosening. Unchanged healing right intertrochanteric femur fracture status post gamma nail fixation. Expected small volume subcutaneous emphysema in the right thigh. IMPRESSION: 1. Interval ORIF of the periprosthetic right femoral diaphyseal fracture, now in near anatomic alignment. No hardware complication. Electronically Signed   By: Titus Dubin M.D.   On: 03/19/2022 10:36   DG FEMUR, MIN 2 VIEWS RIGHT  Result Date: 03/19/2022 CLINICAL DATA:  Surgical internal fixation of right femoral fracture. EXAM: RIGHT FEMUR 2 VIEWS; DG C-ARM 1-60 MIN-NO REPORT Radiation exposure index: 6.82 mGy. COMPARISON:  March 15, 2022. FINDINGS: Ten intraoperative fluoroscopic images were obtained of the right femur. These images demonstrate surgical internal fixation of proximal right femoral shaft fracture. Previous surgical internal fixation of proximal right femur is again noted. IMPRESSION: Fluoroscopic guidance provided during surgical internal fixation of proximal right femoral shaft fracture. Electronically Signed   By: Marijo Conception M.D.   On: 03/19/2022 09:45   DG C-Arm 1-60 Min-No Report  Result Date: 03/19/2022 CLINICAL DATA:  Surgical internal fixation of right femoral fracture. EXAM: RIGHT FEMUR 2 VIEWS;  DG C-ARM 1-60 MIN-NO REPORT Radiation exposure index: 6.82 mGy. COMPARISON:  March 15, 2022. FINDINGS: Ten intraoperative fluoroscopic images were obtained of the right femur. These images demonstrate surgical internal fixation of proximal right femoral shaft fracture. Previous surgical internal fixation of proximal right femur is again noted. IMPRESSION: Fluoroscopic guidance provided during surgical internal fixation of proximal right femoral shaft fracture. Electronically Signed   By: Marijo Conception M.D.   On: 03/19/2022 09:45   DG C-Arm 1-60 Min-No Report  Result Date: 03/19/2022 CLINICAL DATA:  Surgical internal fixation of right femoral fracture. EXAM: RIGHT FEMUR 2 VIEWS; DG C-ARM 1-60 MIN-NO REPORT Radiation exposure index: 6.82 mGy. COMPARISON:  March 15, 2022. FINDINGS: Ten intraoperative fluoroscopic images were obtained of the  right femur. These images demonstrate surgical internal fixation of proximal right femoral shaft fracture. Previous surgical internal fixation of proximal right femur is again noted. IMPRESSION: Fluoroscopic guidance provided during surgical internal fixation of proximal right femoral shaft fracture. Electronically Signed   By: Marijo Conception M.D.   On: 03/19/2022 09:45   DG Chest Port 1 View  Result Date: 03/19/2022 CLINICAL DATA:  COPD EXAM: PORTABLE CHEST 1 VIEW COMPARISON:  Chest radiograph 01/20/2022 FINDINGS: The heart is mildly enlarged, unchanged. There is calcified atherosclerotic plaque of the aortic arch. The upper mediastinal contours are within normal limits There is patchy opacity in the right lower lobe which appears increased since the prior study. There is no other focal airspace disease. There is no pulmonary edema. There is no significant pleural effusion. There is no pneumothorax There is no acute osseous abnormality. IMPRESSION: Patchy opacities in the right lower lobe could reflect developing infection in the correct clinical setting.  Electronically Signed   By: Valetta Mole M.D.   On: 03/19/2022 08:32    Microbiology: Results for orders placed or performed during the hospital encounter of 03/18/22  Surgical pcr screen     Status: None   Collection Time: 03/18/22  1:55 PM   Specimen: Nasal Mucosa; Nasal Swab  Result Value Ref Range Status   MRSA, PCR NEGATIVE NEGATIVE Final   Staphylococcus aureus NEGATIVE NEGATIVE Final    Comment: (NOTE) The Xpert SA Assay (FDA approved for NASAL specimens in patients 63 years of age and older), is one component of a comprehensive surveillance program. It is not intended to diagnose infection nor to guide or monitor treatment. Performed at [facility], [place] [address] [place], [place] [address]     Labs: CBC: Recent Labs  Lab 03/20/22 0244 03/21/22 0339 03/21/22 1842 03/22/22 0538 03/23/22 0354  WBC 9.9 10.5 10.1 8.7 10.1  HGB 8.0* 7.8* 10.3* 10.0* 10.8*  HCT 24.0* 23.7* 31.1* 30.9* 33.6*  MCV 84.8 86.2 86.4 85.8 87.3  PLT 190 205 244 258 604   Basic Metabolic Panel: Recent Labs  Lab 03/19/22 0110 03/20/22 0244 03/21/22 0339 03/22/22 0538 03/23/22 0354  NA 137 134* 137 134* 135  K 4.3 4.4 4.3 4.3 4.0  CL 107 103 107 105 104  CO2 25 23 24 24 25   GLUCOSE 120* 161* 96 98 107*  BUN 21 22 22 18 17   CREATININE 0.89 0.88 0.87 0.79 0.73  CALCIUM 7.8* 7.6* 7.6* 7.7* 7.8*   Liver Function Tests: No results for input(s): "AST", "ALT", "ALKPHOS", "BILITOT", "PROT", "ALBUMIN" in the last 168 hours. CBG: Recent Labs  Lab 03/22/22 1130 03/22/22 1614 03/22/22 1950 03/23/22 0801 03/23/22 1219  GLUCAP 110* 123* 154* 82 90    Discharge time spent: greater than 30 minutes.  Signed: Oswald Hillock, MD [facility] 03/23/2022

## 2022-03-23 NOTE — Progress Notes (Signed)
Attempted to call report to [facility].  No voicemail is available.  Daughter of patient was able to reach nurse at [facility] and told nurse I would be calling report as I stood there beside daughter and could hear nurse on phone from [facility].  This nurse immediately called back to [facility] and still no person is answering the phone to give report.  Charge nurse notified.

## 2022-03-24 DIAGNOSIS — S72351A Displaced comminuted fracture of shaft of right femur, initial encounter for closed fracture: Secondary | ICD-10-CM | POA: Diagnosis not present

## 2022-03-24 LAB — GLUCOSE, CAPILLARY: Glucose-Capillary: 80 mg/dL (ref 70–99)

## 2022-03-24 NOTE — TOC Transition Note (Signed)
Transition of Care [facility]) - CM/SW Discharge Note   Patient Details  Name: [name] MRN: [id number] Date of Birth: [birth year]  Transition of [facility]) CM/SW Contact:  Joanne Chars, LCSW Phone Number: 03/24/2022, 10:06 AM   Clinical Narrative:   Pt discharging to [facility].  RN call report to [phone].      Final next level of care: Skilled Nursing Facility Barriers to Discharge: Barriers Resolved   Patient Goals and CMS Choice Patient states their goals for this hospitalization and ongoing recovery are:: SNF CMS Medicare.gov Compare Post Acute Care list provided to:: Patient Represenative (must comment) (patients daughter [name]) Choice offered to / list presented to : Adult Children (Patients daughter [name])  Discharge Placement              Patient chooses bed at:  [facility]) Patient to be transferred to facility by: [place] Name of family member notified: daughter [name] Patient and family notified of of transfer: 03/24/22  Discharge Plan and Services                                     Social Determinants of Health (SDOH) Interventions     Readmission Risk Interventions     No data to display

## 2022-03-24 NOTE — Progress Notes (Signed)
Patient's daughter Rosita Kea at [phone] was notified that her mother is still at [facility] and will be transported to [facility] later today.  Daughter appreciated the update.   [place] nurse at [facility] was notified as well that patient will remain here until later today.

## 2022-03-24 NOTE — Progress Notes (Signed)
[facility] arrived to transport patient however paper work was not filled out correctly.  On the [facility] Documentation Q1 Are all the following "true"? 1 Patient unable to get up from bed without assistance AND 2 Unable to ambulate AND 3 Unable to sit in a chair, including wheelchair.  For purposes of [facility] this answer must be "yes".  This nurse made changes in [place] as needed  however documents would not print.  [facility] waited for 45 mins and had to leave.  This matter needs to be addressed accordingly between Physical Therapy, Social Work, and MD. Documentation need to be filled out correctly and printed for transport in the morning.

## 2022-03-24 NOTE — Discharge Summary (Signed)
Physician Discharge Summary   Patient: [name] MRN: [id number] DOB: [birth year]  Admit date:     03/18/2022  Discharge date: 03/24/22  Discharge Physician: Charlynne Cousins   PCP: Leslie Andrea, MD   Recommendations at discharge:   Follow-up orthopedics as outpatient  Discharge Diagnoses: Principal Problem:   Femur fracture, right ([place]) Active Problems:   Benign essential HTN   Iron deficiency anemia   Closed fracture of right hip (HCC)   Pressure injury of skin   Heel ulcer, right, with necrosis of bone ([place])  Resolved Problems:   * No resolved hospital problems. *  Hospital Course: No notes on file  Assessment and Plan:  Acute blood loss anemia in the setting of chronic anemia Iron deficiency anemia Acute drop in hemoglobin likely multifactorial in the setting of chronic anemia, iron deficiency anemia, recent acute blood loss anemia in the setting of hip fracture and surgery on last admission S/p 2 units transfusion, transfused another unit on 03/21/2022 as hemoglobin drop to less than 8, now stable  Right periprosthetic femur fracture s/p fall S/p ORIF right periprosthetic femur fracture by Dr Doreatha Martin Continue postop care pain control PT OT DVT prophylaxis -resuming Eliquis 2.5 mg bid   Necrotic right foot decubitus ulcer s/p debridement and VAC application by Dr Sharol Given Continue [facility] care, she will go on Prevena wound VAC.    Vitamind D deficiency: replacement started w/ high-dose weekly.  Replacement for 5 weeks only.   Recent systolic CHF with EF [id number] MR/TR Pulm hypertension: BP is controlled.  Volume status stable. Chest x-ray with patchy opacities in the RLL -encourage incentive spirometry,PT OT Continue home PRN  Lasix, cont home amio, metoprolol at 50 mg w/ holding parameters.   Paroxysmal A-fib, on Eliquis at home, resumed 10/28. Cont metoprololo, amiodarone   COPD/former smoker continue inhaler- not on o2, has diminished BS. Cont prn nebs,  inhalers,as needed   Anxiety/depression-mood stable   Diet controlled diabetes: Diet controlled   Moderate malnutrition:Body mass index is 18.31 kg/m Dietitian consulted.   Pressure injury sacrum stage II POA continue wound care foam dressing      Consultants: Orthopedics Procedures performed: ORIF periprosthetic fracture, Disposition: Skilled nursing facility Diet recommendation:  Discharge Diet Orders (From admission, onward)     Start     Ordered   03/23/22 0000  Diet - low sodium heart healthy        03/23/22 1355           Regular diet DISCHARGE MEDICATION: Allergies as of 03/24/2022       Reactions   Chocolate Hives   Pork-derived Products         Medication List     TAKE these medications    Accu-Chek Aviva Plus test strip Generic drug: glucose blood 1 each daily.   acetaminophen 650 MG CR tablet Commonly known as: TYLENOL Take 650 mg by mouth every 6 (six) hours as needed for pain.   Adjustable Lancing Device Misc USE TO check blood glucose   albuterol 108 (90 Base) MCG/ACT inhaler Commonly known as: VENTOLIN HFA Inhale 1-2 puffs into the lungs every 4 (four) hours as needed for wheezing or shortness of breath.   ALPRAZolam 0.25 MG tablet Commonly known as: XANAX Take 1 tablet (0.25 mg total) by mouth at bedtime as needed for anxiety.   amiodarone 200 MG tablet Commonly known as: PACERONE Take 1 tablet (200 mg total) by mouth daily.   atorvastatin 40 MG tablet Commonly known  as: LIPITOR Take 40 mg by mouth daily at 6 PM.   bismuth subsalicylate 893 YB/01BP suspension Commonly known as: PEPTO BISMOL Take 30 mLs by mouth every 6 (six) hours as needed for indigestion.   COD LIVER OIL PO Take 1 capsule by mouth every 14 (fourteen) days.   Easy Comfort Lancets Misc TO test blood glucose ONCE daily   Eliquis 2.5 MG Tabs tablet Generic drug: apixaban TAKE (1) TABLET TWICE DAILY. What changed: See the new instructions.   feeding  supplement Liqd Take 237 mLs by mouth 2 (two) times daily between meals. What changed: when to take this   ferrous sulfate 325 (65 FE) MG tablet Take 1 tablet (325 mg total) by mouth daily with breakfast.   furosemide 20 MG tablet Commonly known as: LASIX Take 1 tablet (20 mg total) by mouth daily as needed.   HYDROcodone-acetaminophen 5-325 MG tablet Commonly known as: NORCO/VICODIN Take 1 tablet by mouth every 6 (six) hours as needed for severe pain.   levothyroxine 25 MCG tablet Commonly known as: SYNTHROID Take 25 mcg by mouth daily before breakfast.   losartan 25 MG tablet Commonly known as: COZAAR Take 1 tablet (25 mg total) by mouth daily.   Medihoney Wound/Burn Dressing Gel Apply 1 Application topically 2 (two) times daily.   metoprolol succinate 50 MG 24 hr tablet Commonly known as: TOPROL-XL Take 1 tablet (50 mg total) by mouth daily with breakfast. Take with or immediately following a meal. What changed:  medication strength how much to take when to take this   pantoprazole 40 MG tablet Commonly known as: PROTONIX Take 1 tablet (40 mg total) by mouth daily.   polyethylene glycol 17 g packet Commonly known as: MIRALAX / GLYCOLAX Take 17 g by mouth daily as needed for mild constipation. What changed:  when to take this reasons to take this   senna-docusate 8.6-50 MG tablet Commonly known as: Senokot-S Take 1 tablet by mouth 2 (two) times daily for 10 days.   THERA-M ENHANCED PO Take 1 tablet by mouth daily.   Vitamin D (Ergocalciferol) 1.25 MG (50000 UNIT) Caps capsule Commonly known as: DRISDOL Take 1 capsule (50,000 Units total) by mouth every 7 (seven) days. Start taking on: March 26, 2022   Vitamin D3 25 MCG (1000 UT) Caps Take 1,000 Units by mouth daily.               Discharge Care Instructions  (From admission, onward)           Start     Ordered   03/23/22 0000  Discharge wound care:       Comments: Right heel wound  VAC  dressing to a Praveena plus portable wound VAC pump for discharge.   03/23/22 1355            Contact information for follow-up providers     Newt Minion, MD Follow up in 1 week(s).   Specialty: Orthopedic Surgery Contact information: [address] [place] [address] [phone]         Shona Needles, MD. Schedule an appointment as soon as possible for a visit.   Specialty: Orthopedic Surgery Contact information: [place] [address] [phone]              Contact information for after-discharge care     Destination     [facility] .   Service: Skilled Nursing Contact information: [address] [place] [phone]                    Discharge Exam: Danley Danker Weights   03/18/22 1408 03/22/22 0500  Weight: 49.9 kg 53.7 kg   General-appears in no acute distress Heart-S1-S2, regular, no murmur auscultated Lungs-clear to auscultation bilaterally, no wheezing or crackles auscultated Abdomen-soft, nontender, no organomegaly Extremities-no edema in the lower extremities Neuro-alert, oriented x3, no focal deficit noted  Condition at discharge: good  The results of significant diagnostics from this hospitalization (including imaging, microbiology, ancillary and laboratory) are listed below for reference.   Imaging Studies: DG FEMUR PORT, MIN 2 VIEWS RIGHT  Result Date: 03/19/2022 CLINICAL DATA:  Right femur ORIF. EXAM: RIGHT FEMUR PORTABLE 2 VIEW COMPARISON:  CT right femur dated March 17, 2022. FINDINGS: New lateral plate and screw fixation of the oblique periprosthetic femoral diaphyseal fracture, now in near anatomic alignment. A long anterior diaphyseal cortical fragment remains slightly displaced anteriorly. No evidence of hardware failure or loosening. Unchanged healing right intertrochanteric femur fracture status post gamma nail  fixation. Expected small volume subcutaneous emphysema in the right thigh. IMPRESSION: 1. Interval ORIF of the periprosthetic right femoral diaphyseal fracture, now in near anatomic alignment. No hardware complication. Electronically Signed   By: Titus Dubin M.D.   On: 03/19/2022 10:36   DG FEMUR, MIN 2 VIEWS RIGHT  Result Date: 03/19/2022 CLINICAL DATA:  Surgical internal fixation of right femoral fracture. EXAM: RIGHT FEMUR 2 VIEWS; DG C-ARM 1-60 MIN-NO REPORT Radiation exposure index: 6.82 mGy. COMPARISON:  March 15, 2022. FINDINGS: Ten intraoperative fluoroscopic images were obtained of the right femur. These images demonstrate surgical internal fixation of proximal right femoral shaft fracture. Previous surgical internal fixation of proximal right femur is again noted. IMPRESSION: Fluoroscopic guidance provided during surgical internal fixation of proximal right femoral shaft fracture. Electronically Signed   By: Marijo Conception M.D.   On: 03/19/2022 09:45   DG C-Arm 1-60 Min-No Report  Result Date: 03/19/2022 CLINICAL DATA:  Surgical internal fixation of right femoral fracture. EXAM: RIGHT FEMUR 2 VIEWS; DG C-ARM 1-60 MIN-NO REPORT Radiation exposure index: 6.82 mGy. COMPARISON:  March 15, 2022. FINDINGS: Ten intraoperative fluoroscopic images were obtained of the right femur. These images demonstrate surgical internal fixation of proximal right femoral shaft fracture. Previous surgical internal fixation of proximal right femur is again noted. IMPRESSION: Fluoroscopic guidance provided during surgical internal fixation of proximal right femoral shaft fracture. Electronically Signed   By: Marijo Conception M.D.   On: 03/19/2022 09:45   DG C-Arm 1-60 Min-No Report  Result Date: 03/19/2022 CLINICAL DATA:  Surgical internal fixation of right femoral fracture. EXAM: RIGHT FEMUR 2 VIEWS; DG C-ARM 1-60 MIN-NO REPORT Radiation exposure index: 6.82 mGy. COMPARISON:  March 15, 2022. FINDINGS: Ten  intraoperative fluoroscopic images were obtained of the right femur. These images demonstrate surgical internal fixation of proximal right femoral shaft fracture. Previous surgical internal fixation of proximal right femur is again noted. IMPRESSION: Fluoroscopic guidance provided during surgical internal fixation of proximal right femoral shaft fracture. Electronically Signed   By: Marijo Conception M.D.   On: 03/19/2022 09:45   DG Chest Port 1 View  Result Date: 03/19/2022 CLINICAL DATA:  COPD EXAM: PORTABLE CHEST 1 VIEW COMPARISON:  Chest radiograph 01/20/2022 FINDINGS: The heart is mildly enlarged, unchanged. There is calcified atherosclerotic plaque of the aortic arch. The upper mediastinal contours are within normal limits There is patchy opacity in  the right lower lobe which appears increased since the prior study. There is no other focal airspace disease. There is no pulmonary edema. There is no significant pleural effusion. There is no pneumothorax There is no acute osseous abnormality. IMPRESSION: Patchy opacities in the right lower lobe could reflect developing infection in the correct clinical setting. Electronically Signed   By: Valetta Mole M.D.   On: 03/19/2022 08:32    Microbiology: Results for orders placed or performed during the hospital encounter of 03/18/22  Surgical pcr screen     Status: None   Collection Time: 03/18/22  1:55 PM   Specimen: Nasal Mucosa; Nasal Swab  Result Value Ref Range Status   MRSA, PCR NEGATIVE NEGATIVE Final   Staphylococcus aureus NEGATIVE NEGATIVE Final    Comment: (NOTE) The Xpert SA Assay (FDA approved for NASAL specimens in patients 66 years of age and older), is one component of a comprehensive surveillance program. It is not intended to diagnose infection nor to guide or monitor treatment. Performed at [facility], [place] [address], [place], [place] [address]     Labs: CBC: Recent Labs  Lab 03/20/22 0244 03/21/22 0339  03/21/22 1842 03/22/22 0538 03/23/22 0354  WBC 9.9 10.5 10.1 8.7 10.1  HGB 8.0* 7.8* 10.3* 10.0* 10.8*  HCT 24.0* 23.7* 31.1* 30.9* 33.6*  MCV 84.8 86.2 86.4 85.8 87.3  PLT 190 205 244 258 [id number]    Basic Metabolic Panel: Recent Labs  Lab 03/19/22 0110 03/20/22 0244 03/21/22 0339 03/22/22 0538 03/23/22 0354  NA 137 134* 137 134* 135  K 4.3 4.4 4.3 4.3 4.0  CL 107 103 107 105 104  CO2 25 23 24 24 25   GLUCOSE 120* 161* 96 98 107*  BUN 21 22 22 18 17   CREATININE 0.89 0.88 0.87 0.79 0.73  CALCIUM 7.8* 7.6* 7.6* 7.7* 7.8*    Liver Function Tests: No results for input(s): "AST", "ALT", "ALKPHOS", "BILITOT", "PROT", "ALBUMIN" in the last 168 hours. CBG: Recent Labs  Lab 03/23/22 0801 03/23/22 1219 03/23/22 1617 03/23/22 2017 03/24/22 0824  GLUCAP 82 90 158* 113* 80     Discharge time spent: greater than 30 minutes.  Signed: Charlynne Cousins, MD [facility] 03/24/2022

## 2022-03-24 NOTE — Progress Notes (Signed)
Occupational Therapy Treatment Patient Details Name: [name] MRN: [id number] DOB: [birth year] Today's Date: 03/24/2022   History of present illness 88 yo female presents to [facility] on 10/26 s/p fall with xray showing Right peri-implant femoral shaft fracture, s/p IMN R hip intertroch 01/20/2022. Pt also with bilat heel wounds. s/p ORIF R periprosthetic femur fx, removal of hardware as well as xcisional debridement of skin and soft tissue muscle and fascia R heel on 10/27. PMH inlcudes anxiety, afib, HF, COPD, depression, HTN, DMII.   OT comments  Patient received in supine and agreeable to OT session. Patient asked to use bathroom and ambulated to bathroom with min assist to stand.  Patient required LB clothing changed due to urinated in clothing and required mod assist to change. Patient performed grooming standing at sink and returned to supine due to transportation arriving to take to SNF.    Recommendations for follow up therapy are one component of a multi-disciplinary discharge planning process, led by the attending physician.  Recommendations may be updated based on patient status, additional functional criteria and insurance authorization.    Follow Up Recommendations  Home health OT    Assistance Recommended at Discharge Frequent or constant Supervision/Assistance  Patient can return home with the following  A lot of help with bathing/dressing/bathroom;A little help with walking and/or transfers;Direct supervision/assist for medications management;Assistance with cooking/housework   Equipment Recommendations  None recommended by OT    Recommendations for Other Services      Precautions / Restrictions Precautions Precautions: Fall Precaution Comments: wound VAC R heel, HOH Restrictions Weight Bearing Restrictions: Yes RLE Weight Bearing: Weight bearing as tolerated       Mobility Bed Mobility Overal bed mobility: Needs Assistance Bed Mobility: Supine to Sit, Sit to  Supine     Supine to sit: Min guard Sit to supine: Min assist   General bed mobility comments: assistance with LLE to get into bed    Transfers Overall transfer level: Needs assistance Equipment used: Rolling walker (2 wheels) Transfers: Sit to/from Stand Sit to Stand: Min assist           General transfer comment: performed toilet and transfer to EOB     Balance Overall balance assessment: Needs assistance, History of Falls Sitting-balance support: Bilateral upper extremity supported Sitting balance-Leahy Scale: Fair     Standing balance support: Bilateral upper extremity supported, Single extremity supported, During functional activity Standing balance-Leahy Scale: Poor Standing balance comment: able to stand at sink for hand hygiene                           ADL either performed or assessed with clinical judgement   ADL Overall ADL's : Needs assistance/impaired     Grooming: Min guard;Standing Grooming Details (indicate cue type and reason): at sink             Lower Body Dressing: Moderate assistance   Toilet Transfer: Minimal assistance;Regular Toilet;Rolling walker (2 wheels) Toilet Transfer Details (indicate cue type and reason): toilet in bathroom Toileting- Clothing Manipulation and Hygiene: Supervision/safety;Sitting/lateral lean              Extremity/Trunk Assessment              Vision       Perception     Praxis      Cognition Arousal/Alertness: Awake/alert Behavior During Therapy: WFL for tasks assessed/performed Overall Cognitive Status: Within Functional Limits for tasks assessed Area of Impairment:  Attention, Following commands, Safety/judgement                   Current Attention Level: Sustained   Following Commands: Follows multi-step commands consistently, Follows one step commands consistently Safety/Judgement: Decreased awareness of deficits, Decreased awareness of safety               Exercises      Shoulder Instructions       General Comments      Pertinent Vitals/ Pain       Pain Assessment Pain Assessment: Faces Faces Pain Scale: Hurts a little bit Pain Location: R hip Pain Descriptors / Indicators: Grimacing, Guarding Pain Intervention(s): Monitored during session, Repositioned  Home Living                                          Prior Functioning/Environment              Frequency  Min 2X/week        Progress Toward Goals  OT Goals(current goals can now be found in the care plan section)  Progress towards OT goals: Progressing toward goals  Acute Rehab OT Goals Patient Stated Goal: get better OT Goal Formulation: With patient Time For Goal Achievement: 04/03/22 Potential to Achieve Goals: Good ADL Goals Pt Will Perform Grooming: with supervision;with set-up;sitting;with caregiver independent in assisting Pt Will Perform Upper Body Bathing: with supervision;with set-up;sitting;with caregiver independent in assisting Pt Will Perform Lower Body Bathing: with min assist;sitting/lateral leans;sit to/from stand;with caregiver independent in assisting Pt Will Perform Upper Body Dressing: with supervision;with set-up;sitting;with caregiver independent in assisting Pt Will Perform Lower Body Dressing: with mod assist;with min assist;sitting/lateral leans;sit to/from stand;with caregiver independent in assisting Pt Will Transfer to Toilet: with min assist;with min guard assist;ambulating;bedside commode;regular height toilet;grab bars Pt Will Perform Toileting - Clothing Manipulation and hygiene: with min assist;with min guard assist;sit to/from stand;with caregiver independent in assisting  Plan Discharge plan remains appropriate    Co-evaluation                 AM-PAC OT "6 Clicks" Daily Activity     Outcome Measure   Help from another person eating meals?: None Help from another person taking care of personal  grooming?: A Little Help from another person toileting, which includes using toliet, bedpan, or urinal?: A Lot Help from another person bathing (including washing, rinsing, drying)?: A Lot Help from another person to put on and taking off regular upper body clothing?: A Little Help from another person to put on and taking off regular lower body clothing?: A Lot 6 Click Score: 16    End of Session Equipment Utilized During Treatment: Gait belt;Rolling walker (2 wheels)  OT Visit Diagnosis: Unsteadiness on feet (R26.81);Other abnormalities of gait and mobility (R26.89);History of falling (Z91.81);Muscle weakness (generalized) (M62.81)   Activity Tolerance Patient tolerated treatment well   Patient Left in chair;with call bell/phone within reach;with chair alarm set   Nurse Communication Mobility status        Time: [id number] OT Time Calculation (min): 34 min  Charges: OT General Charges $OT Visit: 1 Visit OT Treatments $Self Care/Home Management : 23-37 mins  Lodema Hong, [place]  Office [phone]   Trixie Dredge 03/24/2022, 2:47 PM

## 2022-03-24 NOTE — Discharge Instructions (Signed)

## 2022-03-24 NOTE — Progress Notes (Signed)
Orthopaedic Trauma Progress Note  SUBJECTIVE: Doing well. Pain controlled. Progressing well with therapies. States she feels ready for discharge to SNF today. No family at bedside currently, but I did speak with daughter Webb Silversmith on the phone to provide update.  OBJECTIVE:  Vitals:   03/23/22 1940 03/24/22 0900  BP: (!) 127/49 (!) 135/56  Pulse: 69   Resp: 17   Temp: 98.9 F (37.2 C) 98 F (36.7 C)  SpO2: 100%     General: Sitting up in bed, NAD Respiratory: No increased work of breathing.  RLE: Incisions CDI with Dermabond in place. No significant tenderness with palpation over the hip or throughout the thigh. Ankle DF/PF intact. Tolerates gentle knee motion. Comparments soft and compressible. Able to wiggle toes. + DP pulse  IMAGING: Stable post op imaging.   LABS:  Results for orders placed or performed during the hospital encounter of 03/18/22 (from the past 24 hour(s))  Glucose, capillary     Status: None   Collection Time: 03/23/22 12:19 PM  Result Value Ref Range   Glucose-Capillary 90 70 - 99 mg/dL  Glucose, capillary     Status: Abnormal   Collection Time: 03/23/22  4:17 PM  Result Value Ref Range   Glucose-Capillary 158 (H) 70 - 99 mg/dL  Glucose, capillary     Status: Abnormal   Collection Time: 03/23/22  8:17 PM  Result Value Ref Range   Glucose-Capillary 113 (H) 70 - 99 mg/dL  Glucose, capillary     Status: None   Collection Time: 03/24/22  8:24 AM  Result Value Ref Range   Glucose-Capillary 80 70 - 99 mg/dL    ASSESSMENT: [name] is a 88 y.o. female, 5 Days Post-Op s/p OPEN REDUCTION INTERNAL FIXATION RIGHT PERIPROSTHETIC FEMUR FRACTURE IRRIGATION AND DEBRIDEMENT FOOT WITH APPLICATION OF WOUND VAC by Dr. Sharol Given  CV/Blood loss: Hgb 10.8 03/23/22, continuing to trend upward. Hemodynamically stable  PLAN: Weightbearing: WBAT RLE ROM:  Ok for unrestricted ROM  Incisional and dressing care:  Ok to leave incisions open to air Showering:  Ok to get  incision wet when showering Orthopedic device(s): Wound vac R heel by Dr. Sharol Given  Pain management:  1. Tylenol 650 mg q 8 hours scheduled 2. Robaxin 500 mg q 6 hours PRN 3. Norco 5-325 mg q 4 hours PRN 4. Morphine 0.5-1 mg q 2 hours PRN VTE prophylaxis:  Eliquis , SCDs ID:  Ancef 2gm post op completd Foley/Lines:  No foley, KVO IVFs Impediments to Fracture Healing: Vit D level 22, started on supplementation Dispo: Therapies as tolerated. PT/OT recommending SNF. Patient and family agreeable. Okay for discharge from ortho standpoint once cleared by medicine team and therapies  D/C recommendations: - Norco 5-325 mg for pain control - Continue home dose Eliquis for DVT prophylaxis - Continue Vit D supplementation x8 weeks  Follow - up plan: 2 weeks after d/c for wound check and repeat x-rays   Contact information:  Katha Hamming MD, Rushie Nyhan PA-C. After hours and holidays please check [email or web address] for group call information for [facility]   Gwinda Passe, PA-C [phone] (office) [email or web address]

## 2022-03-24 NOTE — Progress Notes (Signed)
Report called to [facility] arrived to transport patient. Notified patient's daughter, Lelon Frohlich, of transfer.

## 2022-03-29 ENCOUNTER — Telehealth: Payer: Self-pay | Admitting: Orthopedic Surgery

## 2022-03-29 NOTE — Telephone Encounter (Signed)
Makayla informed.

## 2022-03-29 NOTE — Telephone Encounter (Signed)
Per Dr. Sharol Given, okay to take the wound vac off and do dry dressing changes daily. She needs to wear her foam boots daily.

## 2022-03-29 NOTE — Telephone Encounter (Signed)
Pt having problems with wound vac please call [phone] [name] is the nurse

## 2022-04-01 ENCOUNTER — Ambulatory Visit (INDEPENDENT_AMBULATORY_CARE_PROVIDER_SITE_OTHER): Payer: Medicare Other | Admitting: Orthopedic Surgery

## 2022-04-01 DIAGNOSIS — L97421 Non-pressure chronic ulcer of left heel and midfoot limited to breakdown of skin: Secondary | ICD-10-CM | POA: Diagnosis not present

## 2022-04-01 DIAGNOSIS — S7224XS Nondisplaced subtrochanteric fracture of right femur, sequela: Secondary | ICD-10-CM | POA: Diagnosis not present

## 2022-04-01 DIAGNOSIS — L97411 Non-pressure chronic ulcer of right heel and midfoot limited to breakdown of skin: Secondary | ICD-10-CM | POA: Diagnosis not present

## 2022-04-13 ENCOUNTER — Encounter (HOSPITAL_COMMUNITY): Payer: Self-pay | Admitting: Student

## 2022-04-20 ENCOUNTER — Encounter: Payer: Self-pay | Admitting: Orthopedic Surgery

## 2022-04-20 NOTE — Progress Notes (Signed)
Office Visit Note   Patient: [name]           Date of Birth: 21-Jul-1926           MRN: [id number] Visit Date: 04/01/2022              Requested by: John Giovanni, MD [address] [place],  [place] [address] PCP: John Giovanni, MD  Chief Complaint  Patient presents with   Right Leg - Routine Post Op    03/19/22 right femur ORIF and I&D right heel with Dr. Jena Gauss      HPI: Patient is a 88 year old woman who is status post open reduction internal fixation for periprosthetic right femur fracture with Dr. Jena Gauss.  Patient subsequently has developed heel ulcers and did have a Kerecis graft applied to the right heel.  She is currently in foam boots.  Assessment & Plan: Visit Diagnoses:  1. Closed nondisplaced subtrochanteric fracture of right femur, sequela   2. 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 Non-pressure chronic ulcer of right heel and midfoot limited to breakdown of skin (HCC)     Plan: The right heel has excellent granulation tissue will start with Silvadene dressing changes.  There is a new ulcer on the left heel which was debrided we will also start Silvadene dressing changes.  Follow-Up Instructions: Return in about 4 weeks (around 04/29/2022).   Ortho Exam  Patient is alert, oriented, no adenopathy, well-dressed, normal affect, normal respiratory effort. Examination the wound VAC is removed on the right and there is 90% healthy granulation tissue on the right heel ulcer.  The left heel also has a new ulcer.  After informed consent a 10 blade knife was used to debride the skin and soft tissue back to healthy viable granulation tissue.  The ulcer measures 5 cm in diameter after debridement.  Silvadene dressing is applied.  Imaging: No results found. No images are attached to the encounter.  Labs: Lab Results  Component Value Date   HGBA1C 5.4 03/19/2022   HGBA1C 5.5 11/19/2019   HGBA1C 6.1 (H)  09/25/2014   ESRSEDRATE 23 (H) 11/19/2019     Lab Results  Component Value Date   ALBUMIN 2.1 (L) 01/24/2022   ALBUMIN 2.5 (L) 01/23/2022   ALBUMIN 3.8 02/19/2020    Lab Results  Component Value Date   MG 2.4 01/22/2022   MG 1.7 01/21/2022   MG 1.9 03/14/2016   Lab Results  Component Value Date   VD25OH 22.92 (L) 03/19/2022   VD25OH 33.66 01/19/2022    No results found for: "PREALBUMIN"    Latest Ref Rng & Units 03/23/2022    3:54 AM 03/22/2022    5:38 AM 03/21/2022    6:42 PM  CBC EXTENDED  WBC 4.0 - 10.5 K/uL 10.1  8.7  10.1   RBC 3.87 - 5.11 MIL/uL 3.85  3.60  3.60   Hemoglobin 12.0 - 15.0 g/dL 74.9  44.9  67.5   HCT 36.0 - 46.0 % 33.6  30.9  31.1   Platelets 150 - 400 K/uL 269  258  244      There is no height or weight on file to calculate BMI.  Orders:  No orders of the defined types were placed in this encounter.  No orders of the defined types were placed in this encounter.    Procedures: No procedures performed  Clinical Data: No additional findings.  ROS:  All other systems negative, except as noted in the HPI. Review of Systems  Objective: Vital Signs: There were no vitals taken for this visit.  Specialty Comments:  No specialty comments available.  PMFS History: Patient Active Problem List   Diagnosis Date Noted   Heel ulcer, right, with necrosis of bone (HCC) 03/19/2022   Femur fracture, right (HCC) 03/18/2022   Pressure injury of skin 03/18/2022   Acute combined systolic and diastolic heart failure (HCC)    Fall    Paroxysmal SVT (supraventricular tachycardia)    Preop cardiovascular exam    A-fib (HCC) 01/18/2022   Closed fracture of right hip (HCC) 01/18/2022   Upper airway cough syndrome 11/19/2019   Dysphagia, oropharyngeal 04/18/2019   Elevated troponin    Hypertensive emergency    Longstanding persistent atrial fibrillation (HCC)    Severe protein-calorie malnutrition (HCC)    Dysphagia 01/31/2019   Throat  tightness 12/14/2018   Throat dryness 12/14/2018   Dry mouth, unspecified 12/14/2018   Iron deficiency anemia 05/12/2017   GERD (gastroesophageal reflux disease) 03/17/2016   Constipation 10/29/2015   Gastric erosions 02/03/2015   Sinus bradycardia 09/26/2014   Dyspnea on exertion 09/18/2014   Chronic diastolic HF (heart failure) (HCC) 09/18/2014   Dizziness 09/18/2014   Atrial flutter with rapid ventricular response (HCC)    Atrial fibrillation with RVR (HCC) 09/09/2014   Atrial flutter (HCC) 07/13/2014   Hyponatremia 07/13/2014   Diabetes (HCC) 07/13/2014   Hypothyroidism 07/13/2014   Tachycardia 07/13/2014   Dysphagia, pharyngoesophageal phase 04/16/2014   Neurogenic pain of lower extremity 03/15/2012   Left bundle branch block 03/02/2011   RENAL INSUFFICIENCY 05/22/2010   Benign essential HTN 10/09/2009   Mitral regurgitation 10/09/2009   HYPERLIPIDEMIA-MIXED 02/12/2009   Palpitations 02/12/2009   Past Medical History:  Diagnosis Date   Anxiety    Atrial fibrillation (HCC)    CHF (congestive heart failure) (HCC)    a. EF 35-40% by echo in 12/2021   COPD (chronic obstructive pulmonary disease) (HCC)    Depression    Essential hypertension    Hyperlipidemia    Hypothyroidism    Mitral regurgitation    Palpitations    PAT/EAT/NSVT, normal LVEF   Type 2 diabetes mellitus (HCC)     Family History  Problem Relation Age of Onset   Heart failure Mother        Died in her 87s   Tuberculosis Father        Died at young age   Cancer Brother    Arthritis Other    Colon cancer Other     Past Surgical History:  Procedure Laterality Date   APPLICATION OF WOUND VAC Right 03/19/2022   Procedure: APPLICATION OF WOUND VAC;  Surgeon: Nadara Mustard, MD;  Location: [facility] OR;  Service: Orthopedics;  Laterality: Right;   ESOPHAGOGASTRODUODENOSCOPY N/A 04/17/2014   RMR: 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 hiatal  hernia. Antral erosions status post gastric biopsy.   ESOPHAGOGASTRODUODENOSCOPY N/A 05/12/2016   Dr. Jena Gauss: moderate Schatzki ring s/p dilation. moderate sized hiatal hernia   INTRAMEDULLARY (IM) NAIL INTERTROCHANTERIC Right 01/20/2022   Procedure: INTRAMEDULLARY (IM) NAIL INTERTROCHANTERIC;  Surgeon: Roby Lofts, MD;  Location: [facility] OR;  Service: Orthopedics;  Laterality: Right;   INTRAMEDULLARY (IM) NAIL INTERTROCHANTERIC Right 03/19/2022   Procedure: OPEN REDUCTION INTERNAL FIXATION RIGHT FEMUR FRACTURE;  Surgeon: Roby Lofts, MD;  Location: [facility] OR;  Service: Orthopedics;  Laterality: Right;  IRRIGATION AND DEBRIDEMENT FOOT Right 03/19/2022   Procedure: IRRIGATION AND DEBRIDEMENT FOOT;  Surgeon: Nadara Mustard, MD;  Location: [facility] OR;  Service: Orthopedics;  Laterality: Right;   MALONEY DILATION N/A 04/17/2014   Procedure: [name] DILATION;  Surgeon: Corbin Ade, MD;  Location: [facility];  Service: Endoscopy;  Laterality: N/A;   MALONEY DILATION N/A 05/12/2016   Procedure: [name] DILATION;  Surgeon: Corbin Ade, MD;  Location: [facility];  Service: Endoscopy;  Laterality: N/A;   none     Social History   Occupational History   Occupation: Retired    Associate Professor: RETIRED  Tobacco Use   Smoking status: Never   Smokeless tobacco: Never  Vaping Use   Vaping Use: Never used  Substance and Sexual Activity   Alcohol use: No    Alcohol/week: 0.0 standard drinks of alcohol   Drug use: No   Sexual activity: Not Currently

## 2022-04-21 ENCOUNTER — Inpatient Hospital Stay (HOSPITAL_COMMUNITY): Admit: 2022-04-21 | Payer: Medicare Other | Admitting: Internal Medicine

## 2022-04-21 ENCOUNTER — Encounter (HOSPITAL_COMMUNITY): Payer: Self-pay

## 2022-04-27 ENCOUNTER — Ambulatory Visit: Payer: Medicare Other | Admitting: Student

## 2022-04-29 ENCOUNTER — Encounter: Payer: Medicare Other | Admitting: Orthopedic Surgery

## 2022-05-24 DEATH — deceased
